# Patient Record
Sex: Male | Born: 1950 | Race: White | Hispanic: No | Marital: Married | Smoking: Former smoker
Health system: Southern US, Community
[De-identification: ages and names within clinical notes are randomized; demographics above are authoritative.]

## PROBLEM LIST (undated history)

## (undated) DIAGNOSIS — J302 Other seasonal allergic rhinitis: Secondary | ICD-10-CM

## (undated) DIAGNOSIS — N400 Enlarged prostate without lower urinary tract symptoms: Secondary | ICD-10-CM

## (undated) DIAGNOSIS — M1990 Unspecified osteoarthritis, unspecified site: Secondary | ICD-10-CM

## (undated) DIAGNOSIS — M3130 Wegener's granulomatosis without renal involvement: Secondary | ICD-10-CM

## (undated) DIAGNOSIS — K219 Gastro-esophageal reflux disease without esophagitis: Secondary | ICD-10-CM

## (undated) DIAGNOSIS — M5412 Radiculopathy, cervical region: Secondary | ICD-10-CM

## (undated) DIAGNOSIS — Z7983 Long term (current) use of bisphosphonates: Secondary | ICD-10-CM

## (undated) DIAGNOSIS — M4312 Spondylolisthesis, cervical region: Secondary | ICD-10-CM

## (undated) DIAGNOSIS — R29898 Other symptoms and signs involving the musculoskeletal system: Secondary | ICD-10-CM

## (undated) DIAGNOSIS — K810 Acute cholecystitis: Secondary | ICD-10-CM

## (undated) DIAGNOSIS — Q231 Congenital insufficiency of aortic valve: Secondary | ICD-10-CM

## (undated) DIAGNOSIS — D649 Anemia, unspecified: Secondary | ICD-10-CM

## (undated) DIAGNOSIS — K649 Unspecified hemorrhoids: Secondary | ICD-10-CM

## (undated) DIAGNOSIS — I700 Atherosclerosis of aorta: Secondary | ICD-10-CM

## (undated) DIAGNOSIS — I4510 Unspecified right bundle-branch block: Secondary | ICD-10-CM

## (undated) DIAGNOSIS — M4802 Spinal stenosis, cervical region: Secondary | ICD-10-CM

## (undated) DIAGNOSIS — E249 Cushing's syndrome, unspecified: Secondary | ICD-10-CM

## (undated) DIAGNOSIS — I10 Essential (primary) hypertension: Secondary | ICD-10-CM

## (undated) DIAGNOSIS — E039 Hypothyroidism, unspecified: Secondary | ICD-10-CM

## (undated) DIAGNOSIS — H5789 Other specified disorders of eye and adnexa: Secondary | ICD-10-CM

## (undated) DIAGNOSIS — J984 Other disorders of lung: Secondary | ICD-10-CM

## (undated) DIAGNOSIS — I7121 Aneurysm of the ascending aorta, without rupture: Secondary | ICD-10-CM

## (undated) DIAGNOSIS — K589 Irritable bowel syndrome without diarrhea: Secondary | ICD-10-CM

## (undated) DIAGNOSIS — R011 Cardiac murmur, unspecified: Secondary | ICD-10-CM

## (undated) DIAGNOSIS — R7303 Prediabetes: Secondary | ICD-10-CM

## (undated) DIAGNOSIS — M48061 Spinal stenosis, lumbar region without neurogenic claudication: Secondary | ICD-10-CM

## (undated) DIAGNOSIS — I2510 Atherosclerotic heart disease of native coronary artery without angina pectoris: Secondary | ICD-10-CM

## (undated) DIAGNOSIS — K5090 Crohn's disease, unspecified, without complications: Secondary | ICD-10-CM

## (undated) DIAGNOSIS — M810 Age-related osteoporosis without current pathological fracture: Secondary | ICD-10-CM

## (undated) DIAGNOSIS — K760 Fatty (change of) liver, not elsewhere classified: Secondary | ICD-10-CM

## (undated) DIAGNOSIS — Z7952 Long term (current) use of systemic steroids: Secondary | ICD-10-CM

## (undated) DIAGNOSIS — E7800 Pure hypercholesterolemia, unspecified: Secondary | ICD-10-CM

## (undated) DIAGNOSIS — R7309 Other abnormal glucose: Secondary | ICD-10-CM

## (undated) DIAGNOSIS — M8580 Other specified disorders of bone density and structure, unspecified site: Secondary | ICD-10-CM

## (undated) DIAGNOSIS — G709 Myoneural disorder, unspecified: Secondary | ICD-10-CM

## (undated) HISTORY — PX: CHOLECYSTECTOMY: SHX55

## (undated) HISTORY — DX: Cardiac murmur, unspecified: R01.1

## (undated) HISTORY — PX: VASECTOMY: SHX75

## (undated) HISTORY — PX: CATARACT EXTRACTION W/ INTRAOCULAR LENS  IMPLANT, BILATERAL: SHX1307

## (undated) HISTORY — DX: Congenital insufficiency of aortic valve: Q23.1

## (undated) HISTORY — DX: Aneurysm of the ascending aorta, without rupture: I71.21

## (undated) HISTORY — PX: TREATMENT FISTULA ANAL: SUR1390

## (undated) HISTORY — PX: LASER RESURFACING: SHX1954

---

## 2011-08-15 ENCOUNTER — Ambulatory Visit: Payer: Self-pay | Admitting: Rheumatology

## 2011-09-03 ENCOUNTER — Ambulatory Visit: Payer: Self-pay | Admitting: Rheumatology

## 2011-09-24 DEATH — deceased

## 2013-03-05 ENCOUNTER — Ambulatory Visit: Payer: Self-pay | Admitting: Family Medicine

## 2013-06-03 DIAGNOSIS — M1990 Unspecified osteoarthritis, unspecified site: Secondary | ICD-10-CM | POA: Insufficient documentation

## 2014-06-25 ENCOUNTER — Ambulatory Visit
Admit: 2014-06-25 | Disposition: A | Discharge status: Home / Self Care | Payer: Self-pay | Attending: Family Medicine | Admitting: Family Medicine

## 2014-09-20 ENCOUNTER — Ambulatory Visit: Payer: Self-pay | Admitting: Family Medicine

## 2014-10-20 ENCOUNTER — Other Ambulatory Visit: Payer: Self-pay | Admitting: Family Medicine

## 2014-10-20 ENCOUNTER — Encounter: Payer: Self-pay | Admitting: *Deleted

## 2014-10-20 NOTE — Discharge Instructions (Signed)
General Anesthesia, Care After Refer to this sheet in the next few weeks. These instructions provide you with information on caring for yourself after your procedure. Your health care provider may also give you more specific instructions. Your treatment has been planned according to current medical practices, but problems sometimes occur. Call your health care provider if you have any problems or questions after your procedure. WHAT TO EXPECT AFTER THE PROCEDURE After the procedure, it is typical to experience:  Sleepiness.  Nausea and vomiting. HOME CARE INSTRUCTIONS  For the first 24 hours after general anesthesia:  Have a responsible person with you.  Do not drive a car. If you are alone, do not take public transportation.  Do not drink alcohol.  Do not take medicine that has not been prescribed by your health care provider.  Do not sign important papers or make important decisions.  You may resume a normal diet and activities as directed by your health care provider.  Change bandages (dressings) as directed.  If you have questions or problems that seem related to general anesthesia, call the hospital and ask for the anesthetist or anesthesiologist on call. SEEK MEDICAL CARE IF:  You have nausea and vomiting that continue the day after anesthesia.  You develop a rash. SEEK IMMEDIATE MEDICAL CARE IF:   You have difficulty breathing.  You have chest pain.  You have any allergic problems. Document Released: 06/18/2000 Document Revised: 03/17/2013 Document Reviewed: 09/25/2012 [facility] Patient Information 2015 [place], [place]. This information is not intended to replace advice given to you by your health care provider. Make sure you discuss any questions you have with your health care provider.

## 2014-10-25 ENCOUNTER — Ambulatory Visit: Payer: Managed Care, Other (non HMO) | Admitting: Anesthesiology

## 2014-10-25 ENCOUNTER — Encounter
Admission: RE | Disposition: A | Discharge status: Home / Self Care | Payer: Self-pay | Source: Ambulatory Visit | Attending: Gastroenterology

## 2014-10-25 ENCOUNTER — Ambulatory Visit
Admission: RE | Admit: 2014-10-25 | Discharge: 2014-10-25 | Disposition: A | Discharge status: Home / Self Care | Payer: Managed Care, Other (non HMO) | Source: Ambulatory Visit | Attending: Gastroenterology | Admitting: Gastroenterology

## 2014-10-25 DIAGNOSIS — M1990 Unspecified osteoarthritis, unspecified site: Secondary | ICD-10-CM | POA: Insufficient documentation

## 2014-10-25 DIAGNOSIS — M19042 Primary osteoarthritis, left hand: Secondary | ICD-10-CM | POA: Diagnosis not present

## 2014-10-25 DIAGNOSIS — Z87891 Personal history of nicotine dependence: Secondary | ICD-10-CM | POA: Diagnosis not present

## 2014-10-25 DIAGNOSIS — E0500 Thyrotoxicosis with diffuse goiter without thyrotoxic crisis or storm: Secondary | ICD-10-CM | POA: Insufficient documentation

## 2014-10-25 DIAGNOSIS — Z79899 Other long term (current) drug therapy: Secondary | ICD-10-CM | POA: Insufficient documentation

## 2014-10-25 DIAGNOSIS — Z7982 Long term (current) use of aspirin: Secondary | ICD-10-CM | POA: Diagnosis not present

## 2014-10-25 DIAGNOSIS — M3130 Wegener's granulomatosis without renal involvement: Secondary | ICD-10-CM | POA: Diagnosis not present

## 2014-10-25 DIAGNOSIS — M19041 Primary osteoarthritis, right hand: Secondary | ICD-10-CM | POA: Diagnosis not present

## 2014-10-25 DIAGNOSIS — Z1211 Encounter for screening for malignant neoplasm of colon: Secondary | ICD-10-CM | POA: Insufficient documentation

## 2014-10-25 DIAGNOSIS — M810 Age-related osteoporosis without current pathological fracture: Secondary | ICD-10-CM | POA: Insufficient documentation

## 2014-10-25 DIAGNOSIS — M6281 Muscle weakness (generalized): Secondary | ICD-10-CM | POA: Diagnosis not present

## 2014-10-25 DIAGNOSIS — K5730 Diverticulosis of large intestine without perforation or abscess without bleeding: Secondary | ICD-10-CM | POA: Diagnosis not present

## 2014-10-25 DIAGNOSIS — K219 Gastro-esophageal reflux disease without esophagitis: Secondary | ICD-10-CM | POA: Insufficient documentation

## 2014-10-25 DIAGNOSIS — K640 First degree hemorrhoids: Secondary | ICD-10-CM | POA: Diagnosis not present

## 2014-10-25 DIAGNOSIS — D125 Benign neoplasm of sigmoid colon: Secondary | ICD-10-CM | POA: Diagnosis not present

## 2014-10-25 DIAGNOSIS — E780 Pure hypercholesterolemia: Secondary | ICD-10-CM | POA: Insufficient documentation

## 2014-10-25 DIAGNOSIS — E249 Cushing's syndrome, unspecified: Secondary | ICD-10-CM | POA: Diagnosis not present

## 2014-10-25 DIAGNOSIS — Z9049 Acquired absence of other specified parts of digestive tract: Secondary | ICD-10-CM | POA: Insufficient documentation

## 2014-10-25 DIAGNOSIS — M8580 Other specified disorders of bone density and structure, unspecified site: Secondary | ICD-10-CM | POA: Diagnosis not present

## 2014-10-25 HISTORY — DX: Wegener's granulomatosis without renal involvement: M31.30

## 2014-10-25 HISTORY — DX: Other disorders of lung: J98.4

## 2014-10-25 HISTORY — DX: Other seasonal allergic rhinitis: J30.2

## 2014-10-25 HISTORY — DX: Myoneural disorder, unspecified: G70.9

## 2014-10-25 HISTORY — DX: Cushing's syndrome, unspecified: E24.9

## 2014-10-25 HISTORY — DX: Unspecified osteoarthritis, unspecified site: M19.90

## 2014-10-25 HISTORY — DX: Age-related osteoporosis without current pathological fracture: M81.0

## 2014-10-25 HISTORY — DX: Gastro-esophageal reflux disease without esophagitis: K21.9

## 2014-10-25 HISTORY — DX: Other specified disorders of bone density and structure, unspecified site: M85.80

## 2014-10-25 HISTORY — DX: Hypothyroidism, unspecified: E03.9

## 2014-10-25 HISTORY — PX: COLONOSCOPY: SHX5424

## 2014-10-25 HISTORY — DX: Pure hypercholesterolemia, unspecified: E78.00

## 2014-10-25 SURGERY — COLONOSCOPY
Anesthesia: Monitor Anesthesia Care | Wound class: Contaminated

## 2014-10-25 MED ORDER — LIDOCAINE HCL (CARDIAC) 20 MG/ML IV SOLN
INTRAVENOUS | Status: DC | PRN
  Administered 2014-10-25: 50 mg via INTRAVENOUS

## 2014-10-25 MED ORDER — OXYCODONE HCL 5 MG PO TABS: 5.0000 mg | ORAL_TABLET | Freq: Once | ORAL | Status: DC | PRN

## 2014-10-25 MED ORDER — OXYCODONE HCL 5 MG/5ML PO SOLN: 5.0000 mg | Freq: Once | ORAL | Status: DC | PRN

## 2014-10-25 MED ORDER — PROPOFOL 10 MG/ML IV BOLUS
INTRAVENOUS | Status: DC | PRN
  Administered 2014-10-25 (×2): 50 mg via INTRAVENOUS
  Administered 2014-10-25: 100 mg via INTRAVENOUS
  Administered 2014-10-25 (×4): 50 mg via INTRAVENOUS
  Administered 2014-10-25: 100 mg via INTRAVENOUS

## 2014-10-25 MED ORDER — LACTATED RINGERS IV SOLN
INTRAVENOUS | Status: DC
  Administered 2014-10-25: 10:00:00 via INTRAVENOUS

## 2014-10-25 SURGICAL SUPPLY — 28 items
CANISTER SUCT 1200ML W/VALVE (MISCELLANEOUS) ×3 IMPLANT
FCP ESCP3.2XJMB 240X2.8X (MISCELLANEOUS)
FORCEPS BIOP RAD 4 LRG CAP 4 (CUTTING FORCEPS) ×3 IMPLANT
FORCEPS BIOP RJ4 240 W/NDL (MISCELLANEOUS)
FORCEPS ESCP3.2XJMB 240X2.8X (MISCELLANEOUS) IMPLANT
GOWN CVR UNV OPN BCK APRN NK (MISCELLANEOUS) ×2 IMPLANT
GOWN ISOL THUMB LOOP REG UNIV (MISCELLANEOUS) ×4
HEMOCLIP INSTINCT (CLIP) IMPLANT
INJECTOR VARIJECT VIN23 (MISCELLANEOUS) IMPLANT
KIT CO2 TUBING (TUBING) IMPLANT
KIT DEFENDO VALVE AND CONN (KITS) IMPLANT
KIT ENDO PROCEDURE OLY (KITS) ×3 IMPLANT
LIGATOR MULTIBAND 6SHOOTER MBL (MISCELLANEOUS) IMPLANT
MARKER SPOT ENDO TATTOO 5ML (MISCELLANEOUS) IMPLANT
PAD GROUND ADULT SPLIT (MISCELLANEOUS) IMPLANT
SNARE SHORT THROW 13M SML OVAL (MISCELLANEOUS) IMPLANT
SNARE SHORT THROW 30M LRG OVAL (MISCELLANEOUS) IMPLANT
SPOT EX ENDOSCOPIC TATTOO (MISCELLANEOUS)
SUCTION POLY TRAP 4CHAMBER (MISCELLANEOUS) IMPLANT
TRAP SUCTION POLY (MISCELLANEOUS) IMPLANT
TUBING CONN 6MMX3.1M (TUBING)
TUBING SUCTION CONN 0.25 STRL (TUBING) IMPLANT
UNDERPAD 30X60 958B10 (PK) (MISCELLANEOUS) IMPLANT
VALVE BIOPSY ENDO (VALVE) IMPLANT
VARIJECT INJECTOR VIN23 (MISCELLANEOUS)
WATER AUXILLARY (MISCELLANEOUS) IMPLANT
WATER STERILE IRR 250ML POUR (IV SOLUTION) ×3 IMPLANT
WATER STERILE IRR 500ML POUR (IV SOLUTION) IMPLANT

## 2014-10-25 NOTE — Anesthesia Preprocedure Evaluation (Signed)
Anesthesia Evaluation  Patient identified by MRN, date of birth, ID band  Reviewed: NPO status   History of Anesthesia Complications Negative for: history of anesthetic complications  Airway Mallampati: II  TM Distance: >3 FB Neck ROM: full    Dental no notable dental hx.    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;  Osteoporosis;    Abdominal   Peds  Hematology negative hematology ROS (+)   Anesthesia Other Findings   Reproductive/Obstetrics                             Anesthesia Physical Anesthesia Plan  ASA: III  Anesthesia Plan: MAC   Post-op Pain Management:    Induction:   Airway Management Planned:   Additional Equipment:   Intra-op Plan:   Post-operative Plan:   Informed Consent: I have reviewed the patients History and Physical, chart, labs and discussed the procedure including the risks, benefits and alternatives for the proposed anesthesia with the patient or authorized representative who has indicated his/her understanding and acceptance.     Plan Discussed with: CRNA  Anesthesia Plan Comments: (+/- stress dose steroids;)        Anesthesia Quick Evaluation

## 2014-10-25 NOTE — H&P (Signed)
[facility]  [address] [place], [place] [address] Phone: [phone] Fax : [phone]  Primary Care Physician:  No primary care provider on file. Primary Gastroenterologist:  Dr. Allen Norris  Pre-Procedure History & Physical: HPI:  Adrian Zavala is a 64 y.o. male is here for a screening colonoscopy.   Past Medical History  Diagnosis Date  . Wegener's granulomatosis   . GERD (gastroesophageal reflux disease)   . Osteoporosis     Femur  . Osteopenia   . Hypothyroidism     graves  . Cushing syndrome   . Hypercholesteremia   . Arthritis     Spine, Left Hip, Hands  . Neuromuscular disorder     some leg weakness S/P Chemo for Wegener's  . Lung disorder     S/P chemo for Wegener's  . Seasonal allergies     Past Surgical History  Procedure Laterality Date  . Treatment fistula anal      closure with rectal advancement flap  . Cholecystectomy      has "metal sutures"     Prior to Admission medications   Medication Sig Start Date End Date Taking? Authorizing Provider  acetaminophen (TYLENOL) 650 MG CR tablet Take 1,300 mg by mouth 2 (two) times daily.   Yes Historical Provider, MD  alendronate (FOSAMAX) 70 MG tablet Take 1 tablet by mouth once every week 10/21/14  Yes Arlis Porta., MD  aspirin 81 MG tablet Take 81 mg by mouth daily. AM   Yes Historical Provider, MD  atorvastatin (LIPITOR) 20 MG tablet Take 20 mg by mouth daily. AM   Yes Historical Provider, MD  B Complex-C (SUPER B COMPLEX PO) Take by mouth daily. AM   Yes Historical Provider, MD  calcium carbonate (OS-CAL) 600 MG TABS tablet Take 600 mg by mouth daily. AM   Yes Historical Provider, MD  Cholecalciferol (VITAMIN D-3 PO) Take by mouth. AM   Yes Historical Provider, MD  dextromethorphan-guaiFENesin (MUCINEX DM) 30-600 MG per 12 hr tablet Take 1 tablet by mouth 2 (two) times daily as needed for cough.   Yes Historical Provider, MD  fluticasone (FLONASE) 50 MCG/ACT nasal spray Place into  both nostrils daily as needed for allergies or rhinitis.   Yes Historical Provider, MD  folic acid (FOLVITE) 262 MCG tablet Take 400 mcg by mouth daily. AM   Yes Historical Provider, MD  levothyroxine (SYNTHROID, LEVOTHROID) 25 MCG tablet Take 25 mcg by mouth daily before breakfast.   Yes Historical Provider, MD  loratadine (CLARITIN) 10 MG tablet Take 10 mg by mouth daily as needed for allergies.   Yes Historical Provider, MD  meloxicam (MOBIC) 15 MG tablet Take 15 mg by mouth daily. PM   Yes Historical Provider, MD  methotrexate (RHEUMATREX) 2.5 MG tablet Take 15 mg by mouth once a week. Sat or Sun PM   Yes Historical Provider, MD  niacin 500 MG CR capsule Take 750 mg by mouth daily. AM   Yes Historical Provider, MD  prednisoLONE 5 MG TABS tablet Take 5 mg by mouth daily. AM   Yes Historical Provider, MD  ranitidine (ZANTAC) 150 MG tablet Take 150 mg by mouth daily. AM   Yes Historical Provider, MD    Allergies as of 09/28/2014  . (Not on File)    History reviewed. No pertinent family history.  History   Social History  . Marital Status: Married    Spouse Name: N/A  . Number of Children: N/A  . Years of  Education: N/A   Occupational History  . Not on file.   Social History Main Topics  . Smoking status: Former Smoker    Quit date: 08/24/1996  . Smokeless tobacco: Not on file  . Alcohol Use: 1.8 oz/week    3 Cans of beer per week  . Drug Use: Not on file  . Sexual Activity: Not on file   Other Topics Concern  . Not on file   Social History Narrative    Review of Systems: See HPI, otherwise negative ROS  Physical Exam: BP 123/77 mmHg  Pulse 96  Temp(Src) 99.7 F (37.6 C)  Resp 16  Ht 5' 10"  (1.778 m)  Wt 163 lb (73.936 kg)  BMI 23.39 kg/m2  SpO2 98% General:   Alert,  pleasant and cooperative in NAD Head:  Normocephalic and atraumatic. Neck:  Supple; no masses or thyromegaly. Lungs:  Clear throughout to auscultation.    Heart:  Regular rate and  rhythm. Abdomen:  Soft, nontender and nondistended. Normal bowel sounds, without guarding, and without rebound.   Neurologic:  Alert and  oriented x4;  grossly normal neurologically.  Impression/Plan: [name] is now here to undergo a screening colonoscopy.  Risks, benefits, and alternatives regarding colonoscopy have been reviewed with the patient.  Questions have been answered.  All parties agreeable.

## 2014-10-25 NOTE — Op Note (Signed)
[facility] Patient Name: Adrian Zavala Procedure Date: 10/25/2014 10:16 AM MRN: [id number] Account #: [id number] Date of Birth: 1950/11/20 Admit Type: Outpatient Age: 64 Room: [facility] OR ROOM 01 Gender: Male Note Status: Finalized Procedure:         Colonoscopy Indications:       Screening for colorectal malignant neoplasm Providers:         Lucilla Lame, MD Referring MD:      Arlis Porta, MD (Referring MD) Medicines:         Propofol per Anesthesia Complications:     No immediate complications. Procedure:         Pre-Anesthesia Assessment:                    - Prior to the procedure, a History and Physical was                     performed, and patient medications and allergies were                     reviewed. The patient's tolerance of previous anesthesia                     was also reviewed. The risks and benefits of the procedure                     and the sedation options and risks were discussed with the                     patient. All questions were answered, and informed consent                     was obtained. Prior Anticoagulants: The patient has taken                     no previous anticoagulant or antiplatelet agents. ASA                     Grade Assessment: II - A patient with mild systemic                     disease. After reviewing the risks and benefits, the                     patient was deemed in satisfactory condition to undergo                     the procedure.                    After obtaining informed consent, the colonoscope was                     passed under direct vision. Throughout the procedure, the                     patient's blood pressure, pulse, and oxygen saturations                     were monitored continuously. The Olympus CF-HQ190L                     Colonoscope (S#. [id number]) was introduced through the anus  and advanced to the the cecum, identified by appendiceal                      orifice and ileocecal valve. The colonoscopy was performed                     without difficulty. The patient tolerated the procedure                     well. The quality of the bowel preparation was excellent. Findings:      The perianal and digital rectal examinations were normal.      A 3 mm polyp was found in the sigmoid colon. The polyp was sessile. The       polyp was removed with a cold biopsy forceps. Resection and retrieval       were complete.      A single small-mouthed diverticulum was found in the sigmoid colon.      Non-bleeding internal hemorrhoids were found during retroflexion. The       hemorrhoids were Grade I (internal hemorrhoids that do not prolapse). Impression:        - One 3 mm polyp in the sigmoid colon. Resected and                     retrieved.                    - Diverticulosis in the sigmoid colon.                    - Non-bleeding internal hemorrhoids. Recommendation:    - Repeat colonoscopy in 5 years if polyp adenoma and 10                     years if hyperplastic Procedure Code(s): --- Professional ---                    [phone], Colonoscopy, flexible; with biopsy, single or                     multiple Diagnosis Code(s): --- Professional ---                    Z12.11, Encounter for screening for malignant neoplasm of                     colon                    D12.5, Benign neoplasm of sigmoid colon                    K64.0, First degree hemorrhoids CPT copyright 2014 American Medical Association. All rights reserved. The codes documented in this report are preliminary and upon coder review may  be revised to meet current compliance requirements. Lucilla Lame, MD 10/25/2014 10:53:53 AM This report has been signed electronically. Number of Addenda: 0 Note Initiated On: 10/25/2014 10:16 AM Scope Withdrawal Time: 0 hours 8 minutes 22 seconds  Total Procedure Duration: 0 hours 14 minutes 21 seconds       [facility]

## 2014-10-25 NOTE — Anesthesia Procedure Notes (Signed)
Procedure Name: MAC Performed by: Arlene Genova Pre-anesthesia Checklist: Patient identified, Emergency Drugs available, Suction available, Timeout performed and Patient being monitored Patient Re-evaluated:Patient Re-evaluated prior to inductionOxygen Delivery Method: Nasal cannula Placement Confirmation: positive ETCO2     

## 2014-10-25 NOTE — Anesthesia Postprocedure Evaluation (Signed)
  Anesthesia Post-op Note  Patient: Adrian Zavala  Procedure(s) Performed: Procedure(s): COLONOSCOPY (N/A)  Anesthesia type:MAC  Patient location: PACU  Post pain: Pain level controlled  Post assessment: Post-op Vital signs reviewed, Patient's Cardiovascular Status Stable, Respiratory Function Stable, Patent Airway and No signs of Nausea or vomiting  Post vital signs: Reviewed and stable  Last Vitals:  Filed Vitals:   10/25/14 1057  BP:   Pulse: 75  Temp: 36.5 C  Resp: 19    Level of consciousness: awake, alert  and patient cooperative  Complications: No apparent anesthesia complications

## 2014-10-25 NOTE — Transfer of Care (Signed)
Immediate Anesthesia Transfer of Care Note  Patient: Adrian Zavala  Procedure(s) Performed: Procedure(s): COLONOSCOPY (N/A)  Patient Location: PACU  Anesthesia Type: MAC  Level of Consciousness: awake, alert  and patient cooperative  Airway and Oxygen Therapy: Patient Spontanous Breathing and Patient connected to supplemental oxygen  Post-op Assessment: Post-op Vital signs reviewed, Patient's Cardiovascular Status Stable, Respiratory Function Stable, Patent Airway and No signs of Nausea or vomiting  Post-op Vital Signs: Reviewed and stable  Complications: No apparent anesthesia complications

## 2014-10-26 ENCOUNTER — Encounter: Payer: Self-pay | Admitting: Gastroenterology

## 2014-10-27 LAB — SURGICAL PATHOLOGY

## 2014-10-31 ENCOUNTER — Encounter: Payer: Self-pay | Admitting: Gastroenterology

## 2014-11-23 ENCOUNTER — Other Ambulatory Visit: Payer: Self-pay | Admitting: Family Medicine

## 2014-12-06 ENCOUNTER — Other Ambulatory Visit: Payer: Self-pay | Admitting: Family Medicine

## 2014-12-14 ENCOUNTER — Other Ambulatory Visit: Payer: Self-pay | Admitting: *Deleted

## 2014-12-14 MED ORDER — RANITIDINE HCL 150 MG PO CAPS: 150.0000 mg | ORAL_CAPSULE | Freq: Every day | ORAL | Status: DC | PRN

## 2015-01-03 ENCOUNTER — Encounter: Payer: Self-pay | Admitting: Family Medicine

## 2015-01-03 ENCOUNTER — Ambulatory Visit (INDEPENDENT_AMBULATORY_CARE_PROVIDER_SITE_OTHER): Payer: Managed Care, Other (non HMO) | Admitting: Family Medicine

## 2015-01-03 VITALS — BP 113/78 | HR 73 | Temp 98.0°F | Resp 16 | Ht 70.0 in | Wt 168.4 lb

## 2015-01-03 DIAGNOSIS — K5730 Diverticulosis of large intestine without perforation or abscess without bleeding: Secondary | ICD-10-CM

## 2015-01-03 DIAGNOSIS — H5789 Other specified disorders of eye and adnexa: Secondary | ICD-10-CM | POA: Insufficient documentation

## 2015-01-03 DIAGNOSIS — R011 Cardiac murmur, unspecified: Secondary | ICD-10-CM | POA: Diagnosis not present

## 2015-01-03 DIAGNOSIS — E785 Hyperlipidemia, unspecified: Secondary | ICD-10-CM | POA: Insufficient documentation

## 2015-01-03 DIAGNOSIS — I10 Essential (primary) hypertension: Secondary | ICD-10-CM | POA: Insufficient documentation

## 2015-01-03 DIAGNOSIS — K529 Noninfective gastroenteritis and colitis, unspecified: Secondary | ICD-10-CM | POA: Insufficient documentation

## 2015-01-03 DIAGNOSIS — Q231 Congenital insufficiency of aortic valve: Secondary | ICD-10-CM | POA: Insufficient documentation

## 2015-01-03 DIAGNOSIS — E079 Disorder of thyroid, unspecified: Secondary | ICD-10-CM

## 2015-01-03 DIAGNOSIS — E039 Hypothyroidism, unspecified: Secondary | ICD-10-CM | POA: Insufficient documentation

## 2015-01-03 NOTE — Progress Notes (Signed)
Name: Adrian Zavala   MRN: [id number]    DOB: 1950/06/24   Date:01/03/2015       Progress Note  Subjective  Chief Complaint  Chief Complaint  Patient presents with  . Hyperlipidemia  . Hypothyroidism    HPI  Here for f/u of hyperlipidemia and hypothyroidism.  Has Wegener's granulomatosis.  Followed by Dr. Precious Reel.  He feels well overall.  HE has been dx'd with cutaneous lupus.  Already treated. No problem-specific assessment & plan notes found for this encounter.   Past Medical History  Diagnosis Date  . 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[place])   . GERD (gastroesophageal reflux disease)   . Osteoporosis     Femur  . Osteopenia   . Hypothyroidism     graves  . 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[place])   . Hypercholesteremia   . Arthritis     Spine, Left Hip, Hands  . Neuromuscular disorder ([place])     some leg weakness S/P Chemo for Wegener's  . Lung disorder     S/P chemo for Wegener's  . Seasonal allergies     Social History  Substance Use Topics  . Smoking status: Former Smoker    Quit date: 08/24/1996  . Smokeless tobacco: Not on file  . Alcohol Use: 1.8 oz/week    3 Cans of beer per week     Current outpatient prescriptions:  .  acetaminophen (TYLENOL) 650 MG CR tablet, Take 1,300 mg by mouth 2 (two) times daily., Disp: , Rfl:  .  alendronate (FOSAMAX) 70 MG tablet, Take 1 tablet by mouth once every week, Disp: 12 tablet, Rfl: 3 .  aspirin 81 MG tablet, Take 81 mg by mouth daily. AM, Disp: , Rfl:  .  atorvastatin (LIPITOR) 20 MG tablet, Take 1 tablet by mouth  daily, Disp: 90 tablet, Rfl: 3 .  B Complex-C (SUPER B COMPLEX PO), Take by mouth daily. AM, Disp: , Rfl:  .  calcium carbonate (OS-CAL) 600 MG TABS tablet, Take 600 mg by mouth daily. AM, Disp: , Rfl:  .  Cholecalciferol (VITAMIN D-3 PO), Take by mouth. AM, Disp: , Rfl:  .  fluticasone (FLONASE) 50 MCG/ACT nasal spray, Place into both nostrils daily as needed for allergies or rhinitis., Disp: , Rfl:  .  folic acid  (FOLVITE) 650 MCG tablet, Take 400 mcg by mouth daily. AM, Disp: , Rfl:  .  levothyroxine (SYNTHROID, LEVOTHROID) 25 MCG tablet, Take 25 mcg by mouth daily before breakfast., Disp: , Rfl:  .  loratadine (CLARITIN) 10 MG tablet, Take 10 mg by mouth daily as needed for allergies., Disp: , Rfl:  .  methotrexate (RHEUMATREX) 2.5 MG tablet, Take 15 mg by mouth once a week. Sat or Sun PM, Disp: , Rfl:  .  niacin 500 MG CR capsule, Take 750 mg by mouth daily. AM, Disp: , Rfl:  .  prednisoLONE 5 MG TABS tablet, Take 5 mg by mouth daily. AM, Disp: , Rfl:  .  ranitidine (ZANTAC) 150 MG capsule, Take 1 capsule (150 mg total) by mouth daily as needed for heartburn., Disp: 90 capsule, Rfl: 3  Allergies  Allergen Reactions  . Sulfa Antibiotics Anaphylaxis  . Sulfamethoxazole-Trimethoprim Anaphylaxis  . Penicillins Other (See Comments)    Passes out  . Codeine Itching, Other (See Comments) and Rash    tingling Rash and dizziness  . Prilosec [Omeprazole] Hives and Rash  . Tetracycline Rash  . Tetracyclines & Related Rash    Review of Systems  Constitutional: Negative for fever,  chills, weight loss and malaise/fatigue.  HENT: Negative for hearing loss.   Eyes: Negative for blurred vision and double vision.  Respiratory: Negative for cough, shortness of breath and wheezing.   Cardiovascular: Negative for chest pain, palpitations, orthopnea and leg swelling.  Gastrointestinal: Negative for heartburn, abdominal pain and blood in stool.  Genitourinary: Negative for dysuria, urgency and frequency.  Musculoskeletal: Positive for myalgias (muscle cramps in calves.).  Neurological: Negative for dizziness, weakness and headaches.      Objective  Filed Vitals:   01/03/15 1105  BP: 113/78  Pulse: 73  Temp: 98 F (36.7 C)  TempSrc: Oral  Resp: 16  Height: 5' 10"  (1.778 m)  Weight: 168 lb 6.4 oz (76.386 kg)     Physical Exam  Constitutional: He is oriented to person, place, and time and  well-developed, well-nourished, and in no distress. No distress.  HENT:  Head: Normocephalic and atraumatic.  Eyes: Conjunctivae and EOM are normal. Pupils are equal, round, and reactive to light. No scleral icterus.  Neck: Normal range of motion. Neck supple. Carotid bruit is not present. No thyromegaly present.  Cardiovascular: Normal rate, regular rhythm and intact distal pulses.  Exam reveals no gallop and no friction rub.   Murmur heard.  Systolic murmur is present with a grade of 3/6  URSB  Pulmonary/Chest: Effort normal and breath sounds normal. No respiratory distress. He has no wheezes. He has no rales.  Abdominal: Soft. Bowel sounds are normal. He exhibits no distension, no abdominal bruit and no mass. There is no tenderness.  Musculoskeletal: He exhibits no edema.  Lymphadenopathy:    He has no cervical adenopathy.  Neurological: He is alert and oriented to person, place, and time.  Vitals reviewed.     Recent Results (from the past 2160 hour(s))  Surgical pathology     Status: None   Collection Time: 10/25/14 10:53 AM  Result Value Ref Range   SURGICAL PATHOLOGY      Surgical Pathology CASE: [id number] PATIENT: Hesham Behnke Surgical Pathology Report     SPECIMEN SUBMITTED: A. Colon polyp, sigmoid, biopsy  CLINICAL HISTORY: None provided  PRE-OPERATIVE DIAGNOSIS: Screening  POST-OPERATIVE DIAGNOSIS: Diverticulosis, hemorrhoids     DIAGNOSIS: A. COLON POLYP, SIGMOID; BIOPSY: - NONSPECIFIC [place] HYPERPLASIA IN A 1.5 MM SAMPLE. - NEGATIVE FOR DYSPLASIA AND MALIGNANCY; ADDITIONAL DEEPER SECTIONS WERE REVIEWED.   GROSS DESCRIPTION: A. Labeled: sigmoid colon polyp Tissue Fragment(s): 1 Measurement: 0.1 cm Comment: Tan, marked orange  Entirely submitted in cassette(s): 1   Final Diagnosis performed by Bryan Lemma, MD.  Electronically signed 10/27/2014 4:25:00PM    The electronic signature indicates that the named Attending Pathologist has  evaluated the specimen  Technical component performed at [facility], [address] [place], [place] [address] Lab: [phone] Dir: Darrick Penna. Evette Doffing, MD  Professional compo nent performed at [facility], [facility], [place], [place], [place] [address] Lab: [phone] Dir: Dellia Nims. Reuel Derby, MD       Assessment & Plan  1. HLD (hyperlipidemia)  - Comprehensive Metabolic Panel (CMET) - Lipid Profile  2. Disease of thyroid gland  - TSH  3. Cardiac murmur  - Ambulatory referral to Cardiology  4. Diverticulosis of large intestine without hemorrhage

## 2015-01-03 NOTE — Patient Instructions (Signed)
Had flu shot at work.  Had colonoscopy in [place] (Dr. Allen Norris)- all ok except for small benign ployp and diverticulosis.

## 2015-01-05 LAB — LIPID PANEL
CHOL/HDL RATIO: 2.6 ratio (ref 0.0–5.0)
Cholesterol, Total: 146 mg/dL (ref 100–199)
HDL: 56 mg/dL (ref >39)
LDL CALC: 70 mg/dL (ref 0–99)
Triglycerides: 102 mg/dL (ref 0–149)
VLDL Cholesterol Cal: 20 mg/dL (ref 5–40)

## 2015-01-05 LAB — COMPREHENSIVE METABOLIC PANEL
ALBUMIN: 4.7 g/dL (ref 3.6–4.8)
ALT: 30 IU/L (ref 0–44)
AST: 27 IU/L (ref 0–40)
Albumin/Globulin Ratio: 2.5 (ref 1.1–2.5)
Alkaline Phosphatase: 85 IU/L (ref 39–117)
BUN/Creatinine Ratio: 20 (ref 10–22)
BUN: 20 mg/dL (ref 8–27)
Bilirubin Total: 0.7 mg/dL (ref 0.0–1.2)
CO2: 24 mmol/L (ref 18–29)
Calcium: 10.2 mg/dL (ref 8.6–10.2)
Chloride: 105 mmol/L (ref 97–108)
Creatinine, Ser: 0.98 mg/dL (ref 0.76–1.27)
GFR calc Af Amer: 94 mL/min/{1.73_m2} (ref >59)
GFR, EST NON AFRICAN AMERICAN: 82 mL/min/{1.73_m2} (ref >59)
GLOBULIN, TOTAL: 1.9 g/dL (ref 1.5–4.5)
Glucose: 86 mg/dL (ref 65–99)
Potassium: 4.4 mmol/L (ref 3.5–5.2)
SODIUM: 142 mmol/L (ref 134–144)
Total Protein: 6.6 g/dL (ref 6.0–8.5)

## 2015-01-05 LAB — TSH: TSH: 1.51 u[IU]/mL (ref 0.450–4.500)

## 2015-02-22 ENCOUNTER — Emergency Department: Payer: Managed Care, Other (non HMO)

## 2015-02-22 ENCOUNTER — Emergency Department
Admission: EM | Admit: 2015-02-22 | Discharge: 2015-02-22 | Disposition: A | Discharge status: Home / Self Care | Payer: Managed Care, Other (non HMO) | Attending: Emergency Medicine | Admitting: Emergency Medicine

## 2015-02-22 DIAGNOSIS — Z87891 Personal history of nicotine dependence: Secondary | ICD-10-CM | POA: Diagnosis not present

## 2015-02-22 DIAGNOSIS — Z791 Long term (current) use of non-steroidal anti-inflammatories (NSAID): Secondary | ICD-10-CM | POA: Insufficient documentation

## 2015-02-22 DIAGNOSIS — Z7952 Long term (current) use of systemic steroids: Secondary | ICD-10-CM | POA: Insufficient documentation

## 2015-02-22 DIAGNOSIS — M791 Myalgia, unspecified site: Secondary | ICD-10-CM

## 2015-02-22 DIAGNOSIS — Z880 Allergy status to penicillin: Secondary | ICD-10-CM | POA: Insufficient documentation

## 2015-02-22 DIAGNOSIS — R079 Chest pain, unspecified: Secondary | ICD-10-CM | POA: Diagnosis not present

## 2015-02-22 DIAGNOSIS — Z7982 Long term (current) use of aspirin: Secondary | ICD-10-CM | POA: Insufficient documentation

## 2015-02-22 DIAGNOSIS — Z79899 Other long term (current) drug therapy: Secondary | ICD-10-CM | POA: Insufficient documentation

## 2015-02-22 DIAGNOSIS — R1110 Vomiting, unspecified: Secondary | ICD-10-CM

## 2015-02-22 LAB — CBC
HCT: 47.9 % (ref 40.0–52.0)
HEMOGLOBIN: 15.7 g/dL (ref 13.0–18.0)
MCH: 32.7 pg (ref 26.0–34.0)
MCHC: 32.7 g/dL (ref 32.0–36.0)
MCV: 100 fL (ref 80.0–100.0)
PLATELETS: 230 10*3/uL (ref 150–440)
RBC: 4.79 MIL/uL (ref 4.40–5.90)
RDW: 14.2 % (ref 11.5–14.5)
WBC: 11.5 10*3/uL — ABNORMAL HIGH (ref 3.8–10.6)

## 2015-02-22 LAB — COMPREHENSIVE METABOLIC PANEL
ALT: 255 U/L — ABNORMAL HIGH (ref 17–63)
AST: 419 U/L — ABNORMAL HIGH (ref 15–41)
Albumin: 4.4 g/dL (ref 3.5–5.0)
Alkaline Phosphatase: 92 U/L (ref 38–126)
Anion gap: 10 (ref 5–15)
BUN: 38 mg/dL — ABNORMAL HIGH (ref 6–20)
CO2: 27 mmol/L (ref 22–32)
Calcium: 9.9 mg/dL (ref 8.9–10.3)
Chloride: 108 mmol/L (ref 101–111)
Creatinine, Ser: 1.02 mg/dL (ref 0.61–1.24)
GFR calc Af Amer: >60 mL/min (ref >60)
GFR calc non Af Amer: >60 mL/min (ref >60)
GLUCOSE: 127 mg/dL — AB (ref 65–99)
Potassium: 4.3 mmol/L (ref 3.5–5.1)
Sodium: 145 mmol/L (ref 135–145)
Total Bilirubin: 1.8 mg/dL — ABNORMAL HIGH (ref 0.3–1.2)
Total Protein: 6.9 g/dL (ref 6.5–8.1)

## 2015-02-22 LAB — LIPASE, BLOOD: LIPASE: 47 U/L (ref 11–51)

## 2015-02-22 LAB — TROPONIN I: Troponin I: <0.03 ng/mL (ref <0.031)

## 2015-02-22 MED ORDER — ONDANSETRON 4 MG PO TBDP
4.0000 mg | ORAL_TABLET | Freq: Three times a day (TID) | ORAL | Status: DC | PRN
Start: 2015-02-22 — End: 2015-02-22

## 2015-02-22 MED ORDER — ONDANSETRON HCL 4 MG/2ML IJ SOLN
4.0000 mg | Freq: Once | INTRAMUSCULAR | Status: AC
  Administered 2015-02-22: 4 mg via INTRAVENOUS

## 2015-02-22 MED ORDER — SODIUM CHLORIDE 0.9 % IV BOLUS (SEPSIS)
1000.0000 mL | Freq: Once | INTRAVENOUS | Status: AC
Start: 2015-02-22 — End: 2015-02-22
  Administered 2015-02-22: 1000 mL via INTRAVENOUS

## 2015-02-22 MED ORDER — ONDANSETRON HCL 4 MG/2ML IJ SOLN
INTRAMUSCULAR | Status: AC
  Administered 2015-02-22: 4 mg via INTRAVENOUS
  Filled 2015-02-22: qty 2

## 2015-02-22 MED ORDER — ONDANSETRON 4 MG PO TBDP: 4.0000 mg | ORAL_TABLET | Freq: Three times a day (TID) | ORAL | Status: DC | PRN

## 2015-02-22 MED ORDER — METOCLOPRAMIDE HCL 10 MG PO TABS
10.0000 mg | ORAL_TABLET | Freq: Once | ORAL | Status: AC
  Administered 2015-02-22: 10 mg via ORAL
  Filled 2015-02-22: qty 1

## 2015-02-22 MED ORDER — MORPHINE SULFATE (PF) 4 MG/ML IV SOLN
4.0000 mg | Freq: Once | INTRAVENOUS | Status: AC
  Administered 2015-02-22: 4 mg via INTRAVENOUS
  Filled 2015-02-22: qty 1

## 2015-02-22 NOTE — ED Provider Notes (Signed)
[facility] Emergency Department Provider Note  ____________________________________________  Time seen: Approximately 359 AM  I have reviewed the triage vital signs and the nursing notes.   HISTORY  Chief Complaint Emesis and Chest Pain    HPI Adrian Zavala is a 64 y.o. male who comes into the hospital with vomiting. The patient reports that around 1:15 he woke up and was vomiting. He reports that he started getting into bed and was having shakes and pain all over his chest, back and legs and groin. The patient has a history of Wegener's granulomatosis as well as arthritis. He reports that he has had symptoms like this in the past due to the autoimmune diseases. The patient reports that the pain seems a little bit better when he vomits but then it comes back for a little bit. The patient has some tightness in his abdomen before his vomiting but no actual pain at this time. The patient has had occasional Crohn symptoms without an acute diagnosis of Crohn's. He has also had diverticulosis. He reports that right now his pain is a 6 out of 10 and is in his rib cage on the side and in his back. The patient reports the pain is dull and steady. He was actively vomiting when I arrived in his room. The patient is here for treatment and evaluation. The patient's wife is concerned as he has had a [place] abnormalities with the symptoms in the past.   Past Medical History  Diagnosis Date  . 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[place])   . GERD (gastroesophageal reflux disease)   . Osteoporosis     Femur  . Osteopenia   . Hypothyroidism     graves  . 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[place])   . Hypercholesteremia   . Arthritis     Spine, Left Hip, Hands  . Neuromuscular disorder ([place])     some leg weakness S/P Chemo for Wegener's  . Lung disorder     S/P chemo for Wegener's  . Seasonal allergies     Patient Active Problem List   Diagnosis Date Noted  . Colitis 01/03/2015  . HLD (hyperlipidemia)  01/03/2015  . BP (high blood pressure) 01/03/2015  . Eye inflamed 01/03/2015  . Disease of thyroid gland 01/03/2015  . 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 Cardiac murmur 01/03/2015  . Special screening for malignant neoplasms, colon   . Benign neoplasm of sigmoid colon   . First degree hemorrhoids   . Arthritis, degenerative 06/03/2013    Past Surgical History  Procedure Laterality Date  . Treatment fistula anal      closure with rectal advancement flap  . Cholecystectomy      has "metal sutures"   . Colonoscopy N/A 10/25/2014    Procedure: COLONOSCOPY;  Surgeon: Lucilla Lame, MD;  Location: [place];  Service: Gastroenterology;  Laterality: N/A;    Current Outpatient Rx  Name  Route  Sig  Dispense  Refill  . acetaminophen (TYLENOL) 650 MG CR tablet   Oral   Take 350 mg by mouth daily as needed for pain or fever.          Marland Kitchen alendronate (FOSAMAX) 70 MG tablet      Take 1 tablet by mouth once every week   12 tablet   3   . aspirin 81 MG tablet   Oral   Take 81 mg by mouth daily. AM         . calcium carbonate (OS-CAL) 600 MG TABS tablet  Oral   Take 600 mg by mouth daily. AM         . folic acid (FOLVITE) 765 MCG tablet   Oral   Take 400 mcg by mouth daily. AM         . levothyroxine (SYNTHROID, LEVOTHROID) 25 MCG tablet   Oral   Take 25 mcg by mouth daily before breakfast.         . loratadine (CLARITIN) 10 MG tablet   Oral   Take 10 mg by mouth daily as needed for allergies.         . meloxicam (MOBIC) 15 MG tablet   Oral   Take 15 mg by mouth daily.         . methotrexate (RHEUMATREX) 2.5 MG tablet   Oral   Take 15 mg by mouth once a week. Sat or Sun PM         . niacin (NIASPAN) 500 MG CR tablet   Oral   Take 750 mg by mouth at bedtime.         . predniSONE (DELTASONE) 5 MG tablet   Oral   Take 5 mg by mouth daily.         . ranitidine (ZANTAC) 150 MG capsule   Oral   Take 1 capsule (150 mg total)  by mouth daily as needed for heartburn.   90 capsule   3   . Vitamin D, Cholecalciferol, 400 UNITS CAPS   Oral   Take 400 Units by mouth daily.         . ondansetron (ZOFRAN ODT) 4 MG disintegrating tablet   Oral   Take 1 tablet (4 mg total) by mouth every 8 (eight) hours as needed for nausea or vomiting.   20 tablet   0     Allergies Sulfa antibiotics; Sulfamethoxazole-trimethoprim; Penicillins; Codeine; Prilosec; Tetracycline; and Tetracyclines & related  No family history on file.  Social History Social History  Substance Use Topics  . Smoking status: Former Smoker    Quit date: 08/24/1996  . Smokeless tobacco: Not on file  . Alcohol Use: 1.8 oz/week    3 Cans of beer per week    Review of Systems Constitutional: No fever/chills Eyes: No visual changes. ENT: No sore throat. Cardiovascular: Denies chest pain. Respiratory: Denies shortness of breath. Gastrointestinal: No abdominal pain.  No nausea, no vomiting.  No diarrhea.  No constipation. Genitourinary: Negative for dysuria. Musculoskeletal: Negative for back pain. Skin: Negative for rash. Neurological: Negative for headaches, focal weakness or numbness.  10-point ROS otherwise negative.  ____________________________________________   PHYSICAL EXAM:  VITAL SIGNS: ED Triage Vitals  Enc Vitals Group     BP 02/22/15 0329 150/65 mmHg     Pulse Rate 02/22/15 0329 120     Resp 02/22/15 0329 30     Temp 02/22/15 0329 98.3 F (36.8 C)     Temp Source 02/22/15 0329 Oral     SpO2 02/22/15 0329 95 %     Weight 02/22/15 0329 165 lb (74.844 kg)     Height 02/22/15 0329 5' 8"  (1.727 m)     Head Cir --      Peak Flow --      Pain Score 02/22/15 0330 8     Pain Loc --      Pain Edu? --      Excl. in [place]? --     Constitutional: Alert and oriented. Well appearing and in acute distress. Eyes: Conjunctivae are normal.  PERRL. EOMI. Head: Atraumatic. Nose: No congestion/rhinnorhea. Mouth/Throat: Mucous  membranes are moist.  Oropharynx non-erythematous. Cardiovascular: Normal rate, regular rhythm. Grossly normal heart sounds.  Good peripheral circulation. Respiratory: Normal respiratory effort.  No retractions. Lungs CTAB. Gastrointestinal: Soft and nontender. No distention. Positive bowel sounds Musculoskeletal: No lower extremity tenderness nor edema.  Neurologic:  Normal speech and language.  Skin:  Skin is warm, dry and intact.  Psychiatric: Mood and affect are normal.   ____________________________________________   LABS (all labs ordered are listed, but only abnormal results are displayed)  Labs Reviewed  CBC - Abnormal; Notable for the following:    WBC 11.5 (*)    All other components within normal limits  COMPREHENSIVE METABOLIC PANEL - Abnormal; Notable for the following:    Glucose, Bld 127 (*)    BUN 38 (*)    AST 419 (*)    ALT 255 (*)    Total Bilirubin 1.8 (*)    All other components within normal limits  TROPONIN I  LIPASE, BLOOD   ____________________________________________  EKG  ED ECG REPORT I, Loney Hering, the attending physician, personally viewed and interpreted this ECG.   Date: 02/22/2015  EKG Time: 330  Rate: 122  Rhythm: sinus tachycardia  Axis: normal  Intervals:none  ST&T Change: none  ____________________________________________  RADIOLOGY  Abd [place]: post cholecystectomy, probably fatty inflitration of the liver as above ____________________________________________   PROCEDURES  Procedure(s) performed: None  Critical Care performed: No  ____________________________________________   INITIAL IMPRESSION / ASSESSMENT AND PLAN / ED COURSE  Pertinent labs & imaging results that were available during my care of the patient were reviewed by me and considered in my medical decision making (see chart for details).  This is a 64 year old male who comes in with vomiting and diffuse body pain consistent with myalgia. The  patient was given a liter of normal saline as well as some Zofran on arrival. He does have some transaminitis but his ultrasound is negative. The patient may have some hepatitis mild hepatitis causing his symptoms. After the liter of normal saline and the Zofran the patient was feeling improved but he still had some mild tachycardia. I will give the patient another liter of normal saline and attempt a by mouth trial. The patient's care will be signed out to Dr. Joni Fears who will follow-up the results of the hydration and by mouth trial. If the patient is feeling improved she'll be discharged to home. He reports that this is happened multiple times in the past and sometimes he just needs hydration. The patient will be reevaluated by Dr. Joni Fears. ____________________________________________   FINAL CLINICAL IMPRESSION(S) / ED DIAGNOSES  Final diagnoses:  Vomiting  Myalgia      Loney Hering, MD 02/22/15 [phone]

## 2015-02-22 NOTE — ED Notes (Signed)
Pt to triage via w/c, shaking; st since 1am having N/V; c/o pain to mid chest and mid back; wife reports hx of tetany with hypokalemia

## 2015-02-22 NOTE — ED Provider Notes (Signed)
-----------------------------------------   10:05 AM on 02/22/2015 -----------------------------------------  Feels better, tolerating oral intake, 10 home. We'll discharge follow up with primary care.  Carrie Mew, MD 02/22/15 1005

## 2015-02-22 NOTE — Discharge Instructions (Signed)
Nausea and Vomiting Nausea is a sick feeling that often comes before throwing up (vomiting). Vomiting is a reflex where stomach contents come out of your mouth. Vomiting can cause severe loss of body fluids (dehydration). Children and elderly adults can become dehydrated quickly, especially if they also have diarrhea. Nausea and vomiting are symptoms of a condition or disease. It is important to find the cause of your symptoms. CAUSES   Direct irritation of the stomach lining. This irritation can result from increased acid production (gastroesophageal reflux disease), infection, food poisoning, taking certain medicines (such as nonsteroidal anti-inflammatory drugs), alcohol use, or tobacco use.  Signals from the brain.These signals could be caused by a headache, heat exposure, an inner ear disturbance, increased pressure in the brain from injury, infection, a tumor, or a concussion, pain, emotional stimulus, or metabolic problems.  An obstruction in the gastrointestinal tract (bowel obstruction).  Illnesses such as diabetes, hepatitis, gallbladder problems, appendicitis, kidney problems, cancer, sepsis, atypical symptoms of a heart attack, or eating disorders.  Medical treatments such as chemotherapy and radiation.  Receiving medicine that makes you sleep (general anesthetic) during surgery. DIAGNOSIS Your caregiver may ask for tests to be done if the problems do not improve after a few days. Tests may also be done if symptoms are severe or if the reason for the nausea and vomiting is not clear. Tests may include:  Urine tests.  Blood tests.  Stool tests.  Cultures (to look for evidence of infection).  X-rays or other imaging studies. Test results can help your caregiver make decisions about treatment or the need for additional tests. TREATMENT You need to stay well hydrated. Drink frequently but in small amounts.You may wish to drink water, sports drinks, clear broth, or eat frozen  ice pops or gelatin dessert to help stay hydrated.When you eat, eating slowly may help prevent nausea.There are also some antinausea medicines that may help prevent nausea. HOME CARE INSTRUCTIONS   Take all medicine as directed by your caregiver.  If you do not have an appetite, do not force yourself to eat. However, you must continue to drink fluids.  If you have an appetite, eat a normal diet unless your caregiver tells you differently.  Eat a variety of complex carbohydrates (rice, wheat, potatoes, bread), lean meats, yogurt, fruits, and vegetables.  Avoid high-fat foods because they are more difficult to digest.  Drink enough water and fluids to keep your urine clear or pale yellow.  If you are dehydrated, ask your caregiver for specific rehydration instructions. Signs of dehydration may include:  Severe thirst.  Dry lips and mouth.  Dizziness.  Dark urine.  Decreasing urine frequency and amount.  Confusion.  Rapid breathing or pulse. SEEK IMMEDIATE MEDICAL CARE IF:   You have blood or brown flecks (like coffee grounds) in your vomit.  You have black or bloody stools.  You have a severe headache or stiff neck.  You are confused.  You have severe abdominal pain.  You have chest pain or trouble breathing.  You do not urinate at least once every 8 hours.  You develop cold or clammy skin.  You continue to vomit for longer than 24 to 48 hours.  You have a fever. MAKE SURE YOU:   Understand these instructions.  Will watch your condition.  Will get help right away if you are not doing well or get worse.   This information is not intended to replace advice given to you by your health care provider. Make sure  you discuss any questions you have with your health care provider.   Document Released: 03/12/2005 Document Revised: 06/04/2011 Document Reviewed: 08/09/2010 Elsevier Interactive Patient Education 2016 Elsevier Inc.  Musculoskeletal  Pain Musculoskeletal pain is muscle and boney aches and pains. These pains can occur in any part of the body. Your caregiver may treat you without knowing the cause of the pain. They may treat you if blood or urine tests, X-rays, and other tests were normal.  CAUSES There is often not a definite cause or reason for these pains. These pains may be caused by a type of germ (virus). The discomfort may also come from overuse. Overuse includes working out too hard when your body is not fit. Boney aches also come from weather changes. Bone is sensitive to atmospheric pressure changes. HOME CARE INSTRUCTIONS   Ask when your test results will be ready. Make sure you get your test results.  Only take over-the-counter or prescription medicines for pain, discomfort, or fever as directed by your caregiver. If you were given medications for your condition, do not drive, operate machinery or power tools, or sign legal documents for 24 hours. Do not drink alcohol. Do not take sleeping pills or other medications that may interfere with treatment.  Continue all activities unless the activities cause more pain. When the pain lessens, slowly resume normal activities. Gradually increase the intensity and duration of the activities or exercise.  During periods of severe pain, bed rest may be helpful. Lay or sit in any position that is comfortable.  Putting ice on the injured area.  Put ice in a bag.  Place a towel between your skin and the bag.  Leave the ice on for 15 to 20 minutes, 3 to 4 times a day.  Follow up with your caregiver for continued problems and no reason can be found for the pain. If the pain becomes worse or does not go away, it may be necessary to repeat tests or do additional testing. Your caregiver may need to look further for a possible cause. SEEK IMMEDIATE MEDICAL CARE IF:  You have pain that is getting worse and is not relieved by medications.  You develop chest pain that is associated  with shortness or breath, sweating, feeling sick to your stomach (nauseous), or throw up (vomit).  Your pain becomes localized to the abdomen.  You develop any new symptoms that seem different or that concern you. MAKE SURE YOU:   Understand these instructions.  Will watch your condition.  Will get help right away if you are not doing well or get worse.   This information is not intended to replace advice given to you by your health care provider. Make sure you discuss any questions you have with your health care provider.   Document Released: 03/12/2005 Document Revised: 06/04/2011 Document Reviewed: 11/14/2012 Elsevier Interactive Patient Education [employer].

## 2015-02-22 NOTE — ED Notes (Signed)
Pt returned from ultrasound

## 2015-02-28 ENCOUNTER — Ambulatory Visit (INDEPENDENT_AMBULATORY_CARE_PROVIDER_SITE_OTHER): Payer: Managed Care, Other (non HMO) | Admitting: Family Medicine

## 2015-02-28 ENCOUNTER — Encounter: Payer: Self-pay | Admitting: Family Medicine

## 2015-02-28 VITALS — BP 107/64 | HR 81 | Resp 16 | Ht 68.0 in | Wt 171.6 lb

## 2015-02-28 DIAGNOSIS — K529 Noninfective gastroenteritis and colitis, unspecified: Secondary | ICD-10-CM

## 2015-02-28 DIAGNOSIS — R748 Abnormal levels of other serum enzymes: Secondary | ICD-10-CM

## 2015-02-28 NOTE — Progress Notes (Signed)
Name: Adrian Zavala   MRN: [id number]    DOB: Jul 12, 1950   Date:02/28/2015       Progress Note  Subjective  Chief Complaint  Chief Complaint  Patient presents with  . Emesis    fu ER 02/21/2015.    HPI Here for f/u of ER visit for prolonged N/V.  Was in  ER on 11/28 and slept on 11/29, and went back to work 11/30.  Now feels fine.  BAck on all routine meds.  No problem-specific assessment & plan notes found for this encounter.   Past Medical History  Diagnosis Date  . 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[place])   . GERD (gastroesophageal reflux disease)   . Osteoporosis     Femur  . Osteopenia   . Hypothyroidism     graves  . 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[place])   . Hypercholesteremia   . Arthritis     Spine, Left Hip, Hands  . Neuromuscular disorder ([place])     some leg weakness S/P Chemo for Wegener's  . Lung disorder     S/P chemo for Wegener's  . Seasonal allergies     Social History  Substance Use Topics  . Smoking status: Former Smoker    Quit date: 08/24/1996  . Smokeless tobacco: Not on file  . Alcohol Use: 1.8 oz/week    3 Cans of beer per week     Current outpatient prescriptions:  .  acetaminophen (TYLENOL) 650 MG CR tablet, Take 350 mg by mouth daily as needed for pain or fever. , Disp: , Rfl:  .  alendronate (FOSAMAX) 70 MG tablet, Take 1 tablet by mouth once every week, Disp: 12 tablet, Rfl: 3 .  aspirin 81 MG tablet, Take 81 mg by mouth daily. AM, Disp: , Rfl:  .  calcium carbonate (OS-CAL) 600 MG TABS tablet, Take 600 mg by mouth daily. AM, Disp: , Rfl:  .  folic acid (FOLVITE) 517 MCG tablet, Take 400 mcg by mouth daily. AM, Disp: , Rfl:  .  loratadine (CLARITIN) 10 MG tablet, Take 10 mg by mouth daily as needed for allergies., Disp: , Rfl:  .  meloxicam (MOBIC) 15 MG tablet, Take 15 mg by mouth daily., Disp: , Rfl:  .  methotrexate (RHEUMATREX) 2.5 MG tablet, Take 15 mg by mouth once a week. Sat or Sun PM, Disp: , Rfl:  .  niacin (NIASPAN) 500 MG CR tablet, Take 750 mg by  mouth at bedtime., Disp: , Rfl:  .  predniSONE (DELTASONE) 5 MG tablet, Take 5 mg by mouth daily., Disp: , Rfl:  .  ranitidine (ZANTAC) 150 MG capsule, Take 1 capsule (150 mg total) by mouth daily as needed for heartburn., Disp: 90 capsule, Rfl: 3 .  Vitamin D, Cholecalciferol, 400 UNITS CAPS, Take 400 Units by mouth daily., Disp: , Rfl:   Allergies  Allergen Reactions  . Sulfa Antibiotics Anaphylaxis  . Sulfamethoxazole-Trimethoprim Anaphylaxis  . Penicillins Other (See Comments)    Passes out  . Codeine Itching, Other (See Comments) and Rash    tingling Rash and dizziness  . Prilosec [Omeprazole] Hives and Rash  . Tetracycline Rash  . Tetracyclines & Related Rash    Review of Systems  Constitutional: Negative for fever, chills, weight loss and malaise/fatigue.  HENT: Negative for hearing loss.   Eyes: Negative for blurred vision and double vision.  Respiratory: Negative for cough, shortness of breath and wheezing.   Cardiovascular: Negative for chest pain, palpitations and leg swelling.  Gastrointestinal: Negative for heartburn, abdominal  pain and blood in stool.  Genitourinary: Negative for dysuria, urgency and frequency.  Skin: Negative for rash.  Neurological: Negative for weakness and headaches.      Objective  Filed Vitals:   02/28/15 1547  BP: 107/64  Pulse: 81  Resp: 16  Height: 5' 8" (1.727 m)  Weight: 171 lb 9.6 oz (77.837 kg)     Physical Exam  Constitutional: He is oriented to person, place, and time and well-developed, well-nourished, and in no distress. No distress.  HENT:  Head: Normocephalic and atraumatic.  Mouth/Throat:    Eyes: Conjunctivae and EOM are normal. Pupils are equal, round, and reactive to light. No scleral icterus.  Neck: Normal range of motion. Neck supple. No thyromegaly present.  Cardiovascular: Normal rate, regular rhythm and intact distal pulses.  Exam reveals no gallop and no friction rub.   Murmur heard.  Systolic murmur  is present with a grade of 2/6  ursb  Pulmonary/Chest: Effort normal and breath sounds normal. No respiratory distress. He has no wheezes. He exhibits no tenderness.  Abdominal: Soft. Bowel sounds are normal. He exhibits no distension and no mass. There is no tenderness.  Musculoskeletal: He exhibits no edema.  Lymphadenopathy:    He has no cervical adenopathy.  Neurological: He is alert and oriented to person, place, and time.  Vitals reviewed.     Recent Results (from the past 2160 hour(s))  Comprehensive Metabolic Panel (CMET)     Status: None   Collection Time: 01/04/15  8:40 AM  Result Value Ref Range   Glucose 86 65 - 99 mg/dL   BUN 20 8 - 27 mg/dL   Creatinine, Ser 0.98 0.76 - 1.27 mg/dL   GFR calc non Af Amer 82 >59 mL/min/1.73   GFR calc Af Amer 94 >59 mL/min/1.73   BUN/Creatinine Ratio 20 10 - 22   Sodium 142 134 - 144 mmol/L    Comment: **Effective January 10, 2015 the reference interval**   for Sodium, Serum will be changing to:                                             136 - 144    Potassium 4.4 3.5 - 5.2 mmol/L    Comment: **Effective January 10, 2015 the reference interval**   for Potassium, Serum will be changing to:                          0 -  7 days        3.7 - 5.2                          8 - 30 days        3.7 - 6.4                          1 -  6 months      3.8 - 6.0                   7 months -  1 year        3.8 - 5.3                              >  1 year        3.5 - 5.2    Chloride 105 97 - 108 mmol/L    Comment: **Effective January 10, 2015 the reference interval**   for Chloride, Serum will be changing to:                                              97 - 106    CO2 24 18 - 29 mmol/L   Calcium 10.2 8.6 - 10.2 mg/dL   Total Protein 6.6 6.0 - 8.5 g/dL   Albumin 4.7 3.6 - 4.8 g/dL   Globulin, Total 1.9 1.5 - 4.5 g/dL   Albumin/Globulin Ratio 2.5 1.1 - 2.5   Bilirubin Total 0.7 0.0 - 1.2 mg/dL   Alkaline Phosphatase 85 39 - 117 IU/L   AST  27 0 - 40 IU/L   ALT 30 0 - 44 IU/L  Lipid Profile     Status: None   Collection Time: 01/04/15  8:40 AM  Result Value Ref Range   Cholesterol, Total 146 100 - 199 mg/dL   Triglycerides 102 0 - 149 mg/dL   HDL 56 >39 mg/dL    Comment: According to ATP-III Guidelines, HDL-C >59 mg/dL is considered a negative risk factor for CHD.    VLDL Cholesterol Cal 20 5 - 40 mg/dL   LDL Calculated 70 0 - 99 mg/dL   Chol/HDL Ratio 2.6 0.0 - 5.0 ratio units    Comment:                                   T. Chol/HDL Ratio                                             Men  Women                               1/2 Avg.Risk  3.4    3.3                                   Avg.Risk  5.0    4.4                                2X Avg.Risk  9.6    7.1                                3X Avg.Risk 23.4   11.0   TSH     Status: None   Collection Time: 01/04/15  8:40 AM  Result Value Ref Range   TSH 1.510 0.450 - 4.500 uIU/mL  CBC     Status: Abnormal   Collection Time: 02/22/15  4:21 AM  Result Value Ref Range   WBC 11.5 (H) 3.8 - 10.6 K/uL   RBC 4.79 4.40 - 5.90 MIL/uL   Hemoglobin 15.7 13.0 - 18.0 g/dL   HCT 47.9 40.0 - 52.0 %   MCV 100.0  80.0 - 100.0 fL   MCH 32.7 26.0 - 34.0 pg   MCHC 32.7 32.0 - 36.0 g/dL   RDW 14.2 11.5 - 14.5 %   Platelets 230 150 - 440 K/uL  Comprehensive metabolic panel     Status: Abnormal   Collection Time: 02/22/15  4:21 AM  Result Value Ref Range   Sodium 145 135 - 145 mmol/L   Potassium 4.3 3.5 - 5.1 mmol/L   Chloride 108 101 - 111 mmol/L   CO2 27 22 - 32 mmol/L   Glucose, Bld 127 (H) 65 - 99 mg/dL   BUN 38 (H) 6 - 20 mg/dL   Creatinine, Ser 1.02 0.61 - 1.24 mg/dL   Calcium 9.9 8.9 - 10.3 mg/dL   Total Protein 6.9 6.5 - 8.1 g/dL   Albumin 4.4 3.5 - 5.0 g/dL   AST 419 (H) 15 - 41 U/L   ALT 255 (H) 17 - 63 U/L   Alkaline Phosphatase 92 38 - 126 U/L   Total Bilirubin 1.8 (H) 0.3 - 1.2 mg/dL   GFR calc non Af Amer >60 >60 mL/min   GFR calc Af Amer >60 >60 mL/min     Comment: (NOTE) The eGFR has been calculated using the CKD EPI equation. This calculation has not been validated in all clinical situations. eGFR's persistently <60 mL/min signify possible Chronic Kidney Disease.    Anion gap 10 5 - 15  Troponin I     Status: None   Collection Time: 02/22/15  4:21 AM  Result Value Ref Range   Troponin I <0.03 <0.031 ng/mL    Comment:        NO INDICATION OF MYOCARDIAL INJURY.   Lipase, blood     Status: None   Collection Time: 02/22/15  4:21 AM  Result Value Ref Range   Lipase 47 11 - 51 U/L     Assessment & Plan  1. Gastroenteritis  - CBC with Differential  2. Elevated liver enzymes  - Comprehensive Metabolic Panel (CMET)

## 2015-03-01 LAB — CBC WITH DIFFERENTIAL/PLATELET
BASOS: 1 %
Basophils Absolute: 0.1 10*3/uL (ref 0.0–0.2)
EOS (ABSOLUTE): 0.4 10*3/uL (ref 0.0–0.4)
EOS: 4 %
HEMATOCRIT: 41.2 % (ref 37.5–51.0)
Hemoglobin: 14.2 g/dL (ref 12.6–17.7)
Immature Grans (Abs): 0.1 10*3/uL (ref 0.0–0.1)
Immature Granulocytes: 1 %
Lymphocytes Absolute: 2 10*3/uL (ref 0.7–3.1)
Lymphs: 22 %
MCH: 33.6 pg — ABNORMAL HIGH (ref 26.6–33.0)
MCHC: 34.5 g/dL (ref 31.5–35.7)
MCV: 97 fL (ref 79–97)
MONOS ABS: 0.8 10*3/uL (ref 0.1–0.9)
Monocytes: 9 %
Neutrophils Absolute: 5.7 10*3/uL (ref 1.4–7.0)
Neutrophils: 63 %
PLATELETS: 288 10*3/uL (ref 150–379)
RBC: 4.23 x10E6/uL (ref 4.14–5.80)
RDW: 14.2 % (ref 12.3–15.4)
WBC: 9 10*3/uL (ref 3.4–10.8)

## 2015-03-02 LAB — COMPREHENSIVE METABOLIC PANEL
ALBUMIN: 4.7 g/dL (ref 3.6–4.8)
ALK PHOS: 86 IU/L (ref 39–117)
ALT: 97 IU/L — AB (ref 0–44)
AST: 27 IU/L (ref 0–40)
Albumin/Globulin Ratio: 2.8 — ABNORMAL HIGH (ref 1.1–2.5)
BILIRUBIN TOTAL: 0.8 mg/dL (ref 0.0–1.2)
BUN / CREAT RATIO: 20 (ref 10–22)
BUN: 19 mg/dL (ref 8–27)
CHLORIDE: 104 mmol/L (ref 97–106)
CO2: 24 mmol/L (ref 18–29)
Calcium: 10 mg/dL (ref 8.6–10.2)
Creatinine, Ser: 0.94 mg/dL (ref 0.76–1.27)
GFR calc non Af Amer: 86 mL/min/{1.73_m2} (ref >59)
GFR, EST AFRICAN AMERICAN: 99 mL/min/{1.73_m2} (ref >59)
GLUCOSE: 86 mg/dL (ref 65–99)
Globulin, Total: 1.7 g/dL (ref 1.5–4.5)
Potassium: 4.4 mmol/L (ref 3.5–5.2)
Sodium: 144 mmol/L (ref 136–144)
TOTAL PROTEIN: 6.4 g/dL (ref 6.0–8.5)

## 2015-04-04 ENCOUNTER — Encounter: Payer: Self-pay | Admitting: Cardiovascular Disease

## 2015-04-04 ENCOUNTER — Ambulatory Visit (INDEPENDENT_AMBULATORY_CARE_PROVIDER_SITE_OTHER): Payer: Managed Care, Other (non HMO) | Admitting: Cardiovascular Disease

## 2015-04-04 ENCOUNTER — Other Ambulatory Visit (INDEPENDENT_AMBULATORY_CARE_PROVIDER_SITE_OTHER): Payer: Managed Care, Other (non HMO)

## 2015-04-04 ENCOUNTER — Other Ambulatory Visit: Payer: Self-pay

## 2015-04-04 VITALS — BP 150/80 | HR 71 | Ht 68.0 in | Wt 171.5 lb

## 2015-04-04 DIAGNOSIS — I10 Essential (primary) hypertension: Secondary | ICD-10-CM

## 2015-04-04 DIAGNOSIS — R011 Cardiac murmur, unspecified: Secondary | ICD-10-CM

## 2015-04-04 DIAGNOSIS — I350 Nonrheumatic aortic (valve) stenosis: Secondary | ICD-10-CM | POA: Diagnosis not present

## 2015-04-04 DIAGNOSIS — E785 Hyperlipidemia, unspecified: Secondary | ICD-10-CM | POA: Diagnosis not present

## 2015-04-04 DIAGNOSIS — M3130 Wegener's granulomatosis without renal involvement: Secondary | ICD-10-CM

## 2015-04-04 NOTE — Assessment & Plan Note (Signed)
Cholesterol is at goal on the current lipid regimen. No changes to the medications were made.  

## 2015-04-04 NOTE — Progress Notes (Signed)
Patient ID: Adrian Zavala, male    DOB: 1950/04/25, 65 y.o.   MRN: [id number]  HPI Comments: Mr. Schear is a very pleasant 65 year old gentleman with a history of Wegener's, s/p chemo, Cushing's, long history of smoking who stopped in 1998, hyperlipidemia on a statin, on chronic prednisone, presenting for evaluation of a murmur Patient of Dr. Luan Pulling  He reports first having murmur diagnosed in his 30s Denies any significant symptoms. Very active at baseline Likes to lots of housework, denies shortness of breath, leg edema, chest discomfort on exertion Seen recently by Dr. Luan Pulling and murmur felt to be louder than its baseline  We discussed his prior history with him including Wegener's, long history of chemotherapy, on chronic methotrexate once per week, on chronic prednisone down to 5 mg daily.  Reports he's been on cholesterol medication since the 1980s, when it first came out  Prior history and the [employer], Previous issues with gallbladder disease in his 17s  Recent episode of nausea vomiting in November 2016  Prior EKG shows sinus tachycardia with no significant ST or T-wave changes (in the setting of GI distress)   Allergies  Allergen Reactions  . Sulfa Antibiotics Anaphylaxis  . Sulfamethoxazole-Trimethoprim Anaphylaxis  . Penicillins Other (See Comments)    Passes out  . Codeine Itching, Other (See Comments) and Rash    tingling Rash and dizziness  . Prilosec [Omeprazole] Hives and Rash  . Tetracycline Rash  . Tetracyclines & Related Rash    Current Outpatient Prescriptions on File Prior to Visit  Medication Sig Dispense Refill  . acetaminophen (TYLENOL) 650 MG CR tablet Take 350 mg by mouth daily as needed for pain or fever.     Marland Kitchen alendronate (FOSAMAX) 70 MG tablet Take 1 tablet by mouth once every week 12 tablet 3  . aspirin 81 MG tablet Take 81 mg by mouth daily. AM    . calcium carbonate (OS-CAL) 600 MG TABS tablet Take 600 mg by mouth daily. AM    . folic  acid (FOLVITE) 034 MCG tablet Take 400 mcg by mouth daily. AM    . loratadine (CLARITIN) 10 MG tablet Take 10 mg by mouth daily as needed for allergies.    . meloxicam (MOBIC) 15 MG tablet Take 15 mg by mouth daily.    . methotrexate (RHEUMATREX) 2.5 MG tablet Take 15 mg by mouth once a week. Sat or Sun PM    . niacin (NIASPAN) 500 MG CR tablet Take 750 mg by mouth at bedtime.    . predniSONE (DELTASONE) 5 MG tablet Take 5 mg by mouth daily.    . ranitidine (ZANTAC) 150 MG capsule Take 1 capsule (150 mg total) by mouth daily as needed for heartburn. 90 capsule 3  . Vitamin D, Cholecalciferol, 400 UNITS CAPS Take 400 Units by mouth daily.     No current facility-administered medications on file prior to visit.    Past Medical History  Diagnosis Date  . 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 GERD (gastroesophageal reflux disease)   . Osteoporosis     Femur  . Osteopenia   . Hypothyroidism     graves  . 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[place])   . Hypercholesteremia   . Arthritis     Spine, Left Hip, Hands  . Neuromuscular disorder ([place])     some leg weakness S/P Chemo for Wegener's  . Lung disorder     S/P chemo for Wegener's  . Seasonal allergies   . Heart murmur  Past Surgical History  Procedure Laterality Date  . Treatment fistula anal      closure with rectal advancement flap  . Cholecystectomy      has "metal sutures"   . Colonoscopy N/A 10/25/2014    Procedure: COLONOSCOPY;  Surgeon: Lucilla Lame, MD;  Location: [place];  Service: Gastroenterology;  Laterality: N/A;    Social History  reports that he quit smoking about 18 years ago. He does not have any smokeless tobacco history on file. He reports that he drinks about 1.8 oz of alcohol per week.  Family History Family history is unknown by patient.      Review of Systems  Constitutional: Negative.   Respiratory: Negative.   Cardiovascular: Negative.   Gastrointestinal: Negative.   Musculoskeletal: Negative.    Neurological: Negative.   Hematological: Negative.   Psychiatric/Behavioral: Negative.   All other systems reviewed and are negative.    BP 150/80 mmHg  Pulse 71  Ht 5' 8"  (1.727 m)  Wt 171 lb 8 oz (77.792 kg)  BMI 26.08 kg/m2   Physical Exam  Constitutional: He is oriented to person, place, and time. He appears well-developed and well-nourished.  HENT:  Head: Normocephalic.  Nose: Nose normal.  Mouth/Throat: Oropharynx is clear and moist.  Eyes: Conjunctivae are normal. Pupils are equal, round, and reactive to light.  Neck: Normal range of motion. Neck supple. No JVD present.  Cardiovascular: Normal rate, regular rhythm and intact distal pulses.  Exam reveals no gallop and no friction rub.   Murmur heard.  Systolic murmur is present with a grade of 2/6  Pulmonary/Chest: Effort normal and breath sounds normal. No respiratory distress. He has no wheezes. He has no rales. He exhibits no tenderness.  Abdominal: Soft. Bowel sounds are normal. He exhibits no distension. There is no tenderness.  Musculoskeletal: Normal range of motion. He exhibits no edema or tenderness.  Lymphadenopathy:    He has no cervical adenopathy.  Neurological: He is alert and oriented to person, place, and time. Coordination normal.  Skin: Skin is warm and dry. No rash noted. No erythema.  Psychiatric: He has a normal mood and affect. His behavior is normal. Judgment and thought content normal.

## 2015-04-04 NOTE — Assessment & Plan Note (Signed)
Blood pressure is well controlled on today's visit. No changes made to the medications. 

## 2015-04-04 NOTE — Assessment & Plan Note (Signed)
Clinical exam suggest aortic valve disease Unable to exclude bicuspid aortic valve. Details of valve pathology discussed with him Could potentially be aortic valve sclerosis with mild stenosis Echocardiogram has been ordered Currently asymptomatic

## 2015-04-04 NOTE — Assessment & Plan Note (Signed)
Reports he is stable on methotrexate once per week, chronic prednisone

## 2015-04-04 NOTE — Patient Instructions (Addendum)
You are doing well. No medication changes were made.  Research CT coronary calcium score, screen for coronary disease  We will schedule an echocardiogram for aortic valve murmur, stenosis We will call you with the results  Please call us if you have new issues that need to be addressed before your next appt.  Your physician wants you to follow-up in: 12 months.  You will receive a reminder letter in the mail two months in advance. If you don't receive a letter, please call our office to schedule the follow-up appointment.    Echocardiogram An echocardiogram, or echocardiography, uses sound waves (ultrasound) to produce an image of your heart. The echocardiogram is simple, painless, obtained within a short period of time, and offers valuable information to your health care provider. The images from an echocardiogram can provide information such as:  Evidence of coronary artery disease (CAD).  Heart size.  Heart muscle function.  Heart valve function.  Aneurysm detection.  Evidence of a past heart attack.  Fluid buildup around the heart.  Heart muscle thickening.  Assess heart valve function. LET [facility] CARE PROVIDER KNOW ABOUT:  Any allergies you have.  All medicines you are taking, including vitamins, herbs, eye drops, creams, and over-the-counter medicines.  Previous problems you or members of your family have had with the use of anesthetics.  Any blood disorders you have.  Previous surgeries you have had.  Medical conditions you have.  Possibility of pregnancy, if this applies. BEFORE THE PROCEDURE  No special preparation is needed. Eat and drink normally.  PROCEDURE   In order to produce an image of your heart, gel will be applied to your chest and a wand-like tool (transducer) will be moved over your chest. The gel will help transmit the sound waves from the transducer. The sound waves will harmlessly bounce off your heart to allow the heart images to be  captured in real-time motion. These images will then be recorded.  You may need an IV to receive a medicine that improves the quality of the pictures. AFTER THE PROCEDURE You may return to your normal schedule including diet, activities, and medicines, unless your health care provider tells you otherwise.   This information is not intended to replace advice given to you by your health care provider. Make sure you discuss any questions you have with your health care provider.   Document Released: 03/09/2000 Document Revised: 04/02/2014 Document Reviewed: 11/17/2012 Elsevier Interactive Patient Education [employer].

## 2015-05-09 ENCOUNTER — Ambulatory Visit (INDEPENDENT_AMBULATORY_CARE_PROVIDER_SITE_OTHER): Payer: Managed Care, Other (non HMO) | Admitting: Family Medicine

## 2015-05-09 ENCOUNTER — Encounter: Payer: Self-pay | Admitting: Family Medicine

## 2015-05-09 VITALS — BP 128/76 | HR 71 | Resp 16 | Ht 68.0 in | Wt 171.6 lb

## 2015-05-09 DIAGNOSIS — M3130 Wegener's granulomatosis without renal involvement: Secondary | ICD-10-CM | POA: Insufficient documentation

## 2015-05-09 DIAGNOSIS — I10 Essential (primary) hypertension: Secondary | ICD-10-CM

## 2015-05-09 DIAGNOSIS — K760 Fatty (change of) liver, not elsewhere classified: Secondary | ICD-10-CM

## 2015-05-09 DIAGNOSIS — E079 Disorder of thyroid, unspecified: Secondary | ICD-10-CM | POA: Diagnosis not present

## 2015-05-09 NOTE — Progress Notes (Signed)
Name: Adrian Zavala   MRN: [id number]    DOB: [birth year]   Date:05/09/2015       Progress Note  Subjective  Chief Complaint  Chief Complaint  Patient presents with  . Hypothyroidism    Follow Up echo and thyroid    HPI Here for f/u of hypothyroidism.  Takes Methotrexate for Wegeners granulamotosis.  Seen by Dr. Lindon Romp.  Has fatty liver disease.    No problem-specific assessment & plan notes found for this encounter.   Past Medical History  Diagnosis Date  . 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 GERD (gastroesophageal reflux disease)   . Osteoporosis     Femur  . Osteopenia   . Hypothyroidism     graves  . 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[place])   . Hypercholesteremia   . Arthritis     Spine, Left Hip, Hands  . Neuromuscular disorder ([place])     some leg weakness S/P Chemo for Wegener's  . Lung disorder     S/P chemo for Wegener's  . Seasonal allergies   . Heart murmur     Past Surgical History  Procedure Laterality Date  . Treatment fistula anal      closure with rectal advancement flap  . Cholecystectomy      has "metal sutures"   . Colonoscopy N/A 10/25/2014    Procedure: COLONOSCOPY;  Surgeon: Lucilla Lame, MD;  Location: [place];  Service: Gastroenterology;  Laterality: N/A;    Family History  Problem Relation Age of Onset  . Family history unknown: Yes    Social History   Social History  . Marital Status: Married    Spouse Name: N/A  . Number of Children: N/A  . Years of Education: N/A   Occupational History  . Not on file.   Social History Main Topics  . Smoking status: Former Smoker    Quit date: 08/24/1996  . Smokeless tobacco: Not on file  . Alcohol Use: 1.8 oz/week    3 Cans of beer per week  . Drug Use: Not on file  . Sexual Activity: Not on file   Other Topics Concern  . Not on file   Social History Narrative     Current outpatient prescriptions:  .  acetaminophen (TYLENOL) 650 MG CR tablet, Take 350 mg by mouth daily as needed for  pain or fever. , Disp: , Rfl:  .  alendronate (FOSAMAX) 70 MG tablet, Take 1 tablet by mouth once every week, Disp: 12 tablet, Rfl: 3 .  aspirin 81 MG tablet, Take 81 mg by mouth daily. AM, Disp: , Rfl:  .  atorvastatin (LIPITOR) 20 MG tablet, Take 20 mg by mouth daily at 6 PM. , Disp: , Rfl:  .  calcium carbonate (OS-CAL) 600 MG TABS tablet, Take 600 mg by mouth daily. AM, Disp: , Rfl:  .  folic acid (FOLVITE) 893 MCG tablet, Take 400 mcg by mouth daily. AM, Disp: , Rfl:  .  levothyroxine (SYNTHROID, LEVOTHROID) 25 MCG tablet, Take 25 mcg by mouth daily before breakfast., Disp: , Rfl:  .  loratadine (CLARITIN) 10 MG tablet, Take 10 mg by mouth daily as needed for allergies., Disp: , Rfl:  .  meloxicam (MOBIC) 15 MG tablet, Take 15 mg by mouth daily., Disp: , Rfl:  .  methotrexate (RHEUMATREX) 2.5 MG tablet, Take 15 mg by mouth once a week. Sat or Sun PM, Disp: , Rfl:  .  niacin (NIASPAN) 500 MG CR tablet, Take 750 mg by mouth  at bedtime., Disp: , Rfl:  .  predniSONE (DELTASONE) 5 MG tablet, Take 5 mg by mouth daily., Disp: , Rfl:  .  ranitidine (ZANTAC) 150 MG capsule, Take 1 capsule (150 mg total) by mouth daily as needed for heartburn., Disp: 90 capsule, Rfl: 3 .  Vitamin D, Cholecalciferol, 400 UNITS CAPS, Take 400 Units by mouth daily., Disp: , Rfl:   Allergies  Allergen Reactions  . Sulfa Antibiotics Anaphylaxis  . Sulfamethoxazole-Trimethoprim Anaphylaxis  . Penicillins Other (See Comments)    Passes out  . Codeine Itching, Other (See Comments) and Rash    tingling Rash and dizziness  . Prilosec [Omeprazole] Hives and Rash  . Tetracycline Rash  . Tetracyclines & Related Rash     Review of Systems  Constitutional: Negative for fever, chills, weight loss and malaise/fatigue.  HENT: Negative for hearing loss.   Eyes: Negative for blurred vision and double vision.  Respiratory: Negative for cough, shortness of breath and wheezing.   Cardiovascular: Negative for chest pain,  palpitations and leg swelling.  Gastrointestinal: Negative for heartburn, abdominal pain and blood in stool.  Genitourinary: Negative for dysuria, urgency and frequency.  Musculoskeletal: Positive for myalgias (on and off).  Skin: Negative for rash.  Neurological: Negative for dizziness, tremors, weakness and headaches.      Objective  Filed Vitals:   05/09/15 0944  BP: 128/76  Pulse: 71  Resp: 16  Height: 5' 8"  (1.727 m)  Weight: 171 lb 9.6 oz (77.837 kg)    Physical Exam  Constitutional: He is oriented to person, place, and time and well-developed, well-nourished, and in no distress. No distress.  HENT:  Head: Normocephalic.  Eyes: Conjunctivae are normal. Pupils are equal, round, and reactive to light. No scleral icterus.  Neck: Normal range of motion. Neck supple. Carotid bruit is not present. No thyromegaly present.  Cardiovascular: Normal rate and regular rhythm.  Exam reveals no gallop and no friction rub.   Murmur heard.  Systolic murmur is present with a grade of 3/6  URSB  Pulmonary/Chest: Effort normal and breath sounds normal. No respiratory distress. He has no wheezes. He has no rales.  Abdominal: Soft. Bowel sounds are normal. He exhibits no distension and no mass. There is no tenderness.  Musculoskeletal: He exhibits no edema.  Lymphadenopathy:    He has no cervical adenopathy.  Neurological: He is alert and oriented to person, place, and time.  Vitals reviewed.      Recent Results (from the past 2160 hour(s))  CBC     Status: Abnormal   Collection Time: 02/22/15  4:21 AM  Result Value Ref Range   WBC 11.5 (H) 3.8 - 10.6 K/uL   RBC 4.79 4.40 - 5.90 MIL/uL   Hemoglobin 15.7 13.0 - 18.0 g/dL   HCT 47.9 40.0 - 52.0 %   MCV 100.0 80.0 - 100.0 fL   MCH 32.7 26.0 - 34.0 pg   MCHC 32.7 32.0 - 36.0 g/dL   RDW 14.2 11.5 - 14.5 %   Platelets 230 150 - 440 K/uL  Comprehensive metabolic panel     Status: Abnormal   Collection Time: 02/22/15  4:21 AM   Result Value Ref Range   Sodium 145 135 - 145 mmol/L   Potassium 4.3 3.5 - 5.1 mmol/L   Chloride 108 101 - 111 mmol/L   CO2 27 22 - 32 mmol/L   Glucose, Bld 127 (H) 65 - 99 mg/dL   BUN 38 (H) 6 - 20 mg/dL  Creatinine, Ser 1.02 0.61 - 1.24 mg/dL   Calcium 9.9 8.9 - 10.3 mg/dL   Total Protein 6.9 6.5 - 8.1 g/dL   Albumin 4.4 3.5 - 5.0 g/dL   AST 419 (H) 15 - 41 U/L   ALT 255 (H) 17 - 63 U/L   Alkaline Phosphatase 92 38 - 126 U/L   Total Bilirubin 1.8 (H) 0.3 - 1.2 mg/dL   GFR calc non Af Amer >60 >60 mL/min   GFR calc Af Amer >60 >60 mL/min    Comment: (NOTE) The eGFR has been calculated using the CKD EPI equation. This calculation has not been validated in all clinical situations. eGFR's persistently <60 mL/min signify possible Chronic Kidney Disease.    Anion gap 10 5 - 15  Troponin I     Status: None   Collection Time: 02/22/15  4:21 AM  Result Value Ref Range   Troponin I <0.03 <0.031 ng/mL    Comment:        NO INDICATION OF MYOCARDIAL INJURY.   Lipase, blood     Status: None   Collection Time: 02/22/15  4:21 AM  Result Value Ref Range   Lipase 47 11 - 51 U/L  CBC with Differential     Status: Abnormal   Collection Time: 03/01/15  8:09 AM  Result Value Ref Range   WBC 9.0 3.4 - 10.8 x10E3/uL   RBC 4.23 4.14 - 5.80 x10E6/uL   Hemoglobin 14.2 12.6 - 17.7 g/dL   Hematocrit 41.2 37.5 - 51.0 %   MCV 97 79 - 97 fL   MCH 33.6 (H) 26.6 - 33.0 pg   MCHC 34.5 31.5 - 35.7 g/dL   RDW 14.2 12.3 - 15.4 %   Platelets 288 150 - 379 x10E3/uL   Neutrophils 63 %   Lymphs 22 %   Monocytes 9 %   Eos 4 %   Basos 1 %   Neutrophils Absolute 5.7 1.4 - 7.0 x10E3/uL   Lymphocytes Absolute 2.0 0.7 - 3.1 x10E3/uL   Monocytes Absolute 0.8 0.1 - 0.9 x10E3/uL   EOS (ABSOLUTE) 0.4 0.0 - 0.4 x10E3/uL   Basophils Absolute 0.1 0.0 - 0.2 x10E3/uL   Immature Granulocytes 1 %   Immature Grans (Abs) 0.1 0.0 - 0.1 x10E3/uL  Comprehensive Metabolic Panel (CMET)     Status: Abnormal    Collection Time: 03/01/15  8:09 AM  Result Value Ref Range   Glucose 86 65 - 99 mg/dL   BUN 19 8 - 27 mg/dL   Creatinine, Ser 0.94 0.76 - 1.27 mg/dL   GFR calc non Af Amer 86 >59 mL/min/1.73   GFR calc Af Amer 99 >59 mL/min/1.73   BUN/Creatinine Ratio 20 10 - 22   Sodium 144 136 - 144 mmol/L    Comment: **Effective March 07, 2015 the reference interval**   for Sodium, Serum will be changing to:                                             134 - 144    Potassium 4.4 3.5 - 5.2 mmol/L   Chloride 104 97 - 106 mmol/L    Comment: **Effective March 07, 2015 the reference interval**   for Chloride, Serum will be changing to:  96 - 106    CO2 24 18 - 29 mmol/L   Calcium 10.0 8.6 - 10.2 mg/dL   Total Protein 6.4 6.0 - 8.5 g/dL   Albumin 4.7 3.6 - 4.8 g/dL   Globulin, Total 1.7 1.5 - 4.5 g/dL   Albumin/Globulin Ratio 2.8 (H) 1.1 - 2.5   Bilirubin Total 0.8 0.0 - 1.2 mg/dL   Alkaline Phosphatase 86 39 - 117 IU/L   AST 27 0 - 40 IU/L   ALT 97 (H) 0 - 44 IU/L     Assessment & Plan  Problem List Items Addressed This Visit      Cardiovascular and Mediastinum   BP (high blood pressure)   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HCC)     Digestive   Fatty liver disease, nonalcoholic   Relevant Orders   Comprehensive Metabolic Panel (CMET)     Endocrine   Disease of thyroid gland - Primary   Relevant Orders   TSH     1. Disease of thyroid gland  - TSH  2. Essential hypertension   3. Fatty liver disease, nonalcoholic  - Comprehensive Metabolic Panel (CMET)  4. 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   Continue all current medications

## 2015-05-17 LAB — COMPREHENSIVE METABOLIC PANEL
ALBUMIN: 4.7 g/dL (ref 3.6–4.8)
ALK PHOS: 64 IU/L (ref 39–117)
ALT: 40 IU/L (ref 0–44)
AST: 23 IU/L (ref 0–40)
Albumin/Globulin Ratio: 2.6 — ABNORMAL HIGH (ref 1.1–2.5)
BUN / CREAT RATIO: 22 (ref 10–22)
BUN: 21 mg/dL (ref 8–27)
Bilirubin Total: 0.7 mg/dL (ref 0.0–1.2)
CHLORIDE: 104 mmol/L (ref 96–106)
CO2: 25 mmol/L (ref 18–29)
Calcium: 10.3 mg/dL — ABNORMAL HIGH (ref 8.6–10.2)
Creatinine, Ser: 0.96 mg/dL (ref 0.76–1.27)
GFR calc Af Amer: 97 mL/min/{1.73_m2} (ref >59)
GFR calc non Af Amer: 84 mL/min/{1.73_m2} (ref >59)
Globulin, Total: 1.8 g/dL (ref 1.5–4.5)
Glucose: 96 mg/dL (ref 65–99)
Potassium: 4.5 mmol/L (ref 3.5–5.2)
Sodium: 143 mmol/L (ref 134–144)
Total Protein: 6.5 g/dL (ref 6.0–8.5)

## 2015-05-17 LAB — TSH: TSH: 1.47 u[IU]/mL (ref 0.450–4.500)

## 2015-06-06 ENCOUNTER — Encounter: Payer: Self-pay | Admitting: Family Medicine

## 2015-06-06 ENCOUNTER — Ambulatory Visit (INDEPENDENT_AMBULATORY_CARE_PROVIDER_SITE_OTHER): Payer: Managed Care, Other (non HMO) | Admitting: Family Medicine

## 2015-06-06 VITALS — BP 98/59 | HR 80 | Resp 16 | Ht 68.0 in | Wt 172.4 lb

## 2015-06-06 DIAGNOSIS — K529 Noninfective gastroenteritis and colitis, unspecified: Secondary | ICD-10-CM | POA: Diagnosis not present

## 2015-06-06 DIAGNOSIS — M791 Myalgia, unspecified site: Secondary | ICD-10-CM | POA: Insufficient documentation

## 2015-06-06 DIAGNOSIS — E785 Hyperlipidemia, unspecified: Secondary | ICD-10-CM

## 2015-06-06 DIAGNOSIS — E079 Disorder of thyroid, unspecified: Secondary | ICD-10-CM | POA: Diagnosis not present

## 2015-06-06 DIAGNOSIS — I10 Essential (primary) hypertension: Secondary | ICD-10-CM

## 2015-06-06 DIAGNOSIS — Z0000 Encounter for general adult medical examination without abnormal findings: Secondary | ICD-10-CM | POA: Diagnosis not present

## 2015-06-06 DIAGNOSIS — M3130 Wegener's granulomatosis without renal involvement: Secondary | ICD-10-CM

## 2015-06-06 DIAGNOSIS — N400 Enlarged prostate without lower urinary tract symptoms: Secondary | ICD-10-CM | POA: Diagnosis not present

## 2015-06-06 NOTE — Progress Notes (Signed)
Name: Adrian Zavala   MRN: [id number]    DOB: 1951/01/03   Date:06/06/2015       Progress Note  Subjective  Chief Complaint  Chief Complaint  Patient presents with  . Annual Exam    HPI Here for annual physical exam (health maintenance).  He has Wegner's Granulomatosis, osteoporosis.  No problem-specific assessment & plan notes found for this encounter.   Past Medical History  Diagnosis Date  . 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 GERD (gastroesophageal reflux disease)   . Osteoporosis     Femur  . Osteopenia   . Hypothyroidism     graves  . 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[place])   . Hypercholesteremia   . Arthritis     Spine, Left Hip, Hands  . Neuromuscular disorder ([place])     some leg weakness S/P Chemo for Wegener's  . Lung disorder     S/P chemo for Wegener's  . Seasonal allergies   . Heart murmur     Past Surgical History  Procedure Laterality Date  . Treatment fistula anal      closure with rectal advancement flap  . Cholecystectomy      has "metal sutures"   . Colonoscopy N/A 10/25/2014    Procedure: COLONOSCOPY;  Surgeon: Lucilla Lame, MD;  Location: [place];  Service: Gastroenterology;  Laterality: N/A;    Family History  Problem Relation Age of Onset  . Family history unknown: Yes    Social History   Social History  . Marital Status: Married    Spouse Name: N/A  . Number of Children: N/A  . Years of Education: N/A   Occupational History  . Not on file.   Social History Main Topics  . Smoking status: Former Smoker    Quit date: 08/24/1996  . Smokeless tobacco: Not on file  . Alcohol Use: 1.8 oz/week    3 Cans of beer per week  . Drug Use: Not on file  . Sexual Activity: Not on file   Other Topics Concern  . Not on file   Social History Narrative     Current outpatient prescriptions:  .  acetaminophen (TYLENOL) 650 MG CR tablet, Take 350 mg by mouth daily as needed for pain or fever. , Disp: , Rfl:  .  alendronate (FOSAMAX) 70 MG tablet,  Take 1 tablet by mouth once every week, Disp: 12 tablet, Rfl: 3 .  aspirin 81 MG tablet, Take 81 mg by mouth daily. AM, Disp: , Rfl:  .  calcium carbonate (OS-CAL) 600 MG TABS tablet, Take 600 mg by mouth daily. AM, Disp: , Rfl:  .  folic acid (FOLVITE) 782 MCG tablet, Take 400 mcg by mouth daily. AM, Disp: , Rfl:  .  levothyroxine (SYNTHROID, LEVOTHROID) 25 MCG tablet, Take 25 mcg by mouth daily before breakfast., Disp: , Rfl:  .  loratadine (CLARITIN) 10 MG tablet, Take 10 mg by mouth daily as needed for allergies., Disp: , Rfl:  .  meloxicam (MOBIC) 15 MG tablet, Take 15 mg by mouth daily., Disp: , Rfl:  .  methotrexate (RHEUMATREX) 2.5 MG tablet, Take 15 mg by mouth once a week. Sat or Sun PM, Disp: , Rfl:  .  niacin (NIASPAN) 500 MG CR tablet, Take 750 mg by mouth at bedtime., Disp: , Rfl:  .  predniSONE (DELTASONE) 5 MG tablet, Take 5 mg by mouth daily., Disp: , Rfl:  .  ranitidine (ZANTAC) 150 MG capsule, Take 1 capsule (150 mg total) by mouth daily  as needed for heartburn., Disp: 90 capsule, Rfl: 3 .  Vitamin D, Cholecalciferol, 400 UNITS CAPS, Take 400 Units by mouth daily., Disp: , Rfl:   Allergies  Allergen Reactions  . Sulfa Antibiotics Anaphylaxis  . Sulfamethoxazole-Trimethoprim Anaphylaxis  . Penicillins Other (See Comments)    Passes out  . Codeine Itching, Other (See Comments) and Rash    tingling Rash and dizziness  . Prilosec [Omeprazole] Hives and Rash  . Tetracycline Rash  . Tetracyclines & Related Rash     Review of Systems  Constitutional: Negative for fever, chills, weight loss and malaise/fatigue.  HENT: Negative for hearing loss.   Eyes: Negative for blurred vision and double vision.  Respiratory: Negative for cough, hemoptysis, shortness of breath and wheezing.   Cardiovascular: Negative for chest pain, palpitations and leg swelling.  Gastrointestinal: Negative for heartburn, abdominal pain and blood in stool.  Genitourinary: Negative for dysuria, urgency  and frequency.  Musculoskeletal: Positive for myalgias and joint pain.       Hips, hands, ribs, back pain.  Skin: Negative for rash.  Neurological: Negative for dizziness, tremors, weakness and headaches.      Objective  Filed Vitals:   06/06/15 1410  BP: 98/59  Pulse: 80  Resp: 16  Height: 5' 8"  (1.727 m)  Weight: 172 lb 6.4 oz (78.2 kg)    Physical Exam  Constitutional: He is oriented to person, place, and time. No distress.  HENT:  Head: Normocephalic and atraumatic.  Right Ear: External ear normal.  Left Ear: External ear normal.  Nose: Nose normal.  Mouth/Throat: Oropharynx is clear and moist.  Eyes: Conjunctivae and EOM are normal. Pupils are equal, round, and reactive to light. No scleral icterus.  Neck: Normal range of motion. Neck supple. Carotid bruit is not present. No thyromegaly present.  Cardiovascular: Normal rate.   Murmur heard.  Systolic murmur is present with a grade of 3/6  URSB  Pulmonary/Chest: Effort normal and breath sounds normal. No respiratory distress. He has no wheezes. He has no rales.  Abdominal: Soft. Bowel sounds are normal. He exhibits no distension, no abdominal bruit and no mass. There is no tenderness.  Genitourinary: Rectum normal and penis normal.  Prostate is 1+ enlarged, symmetrical, non-tender, and without nodules.  Musculoskeletal: He exhibits no edema.  Lymphadenopathy:    He has no cervical adenopathy.  Neurological: He is alert and oriented to person, place, and time.  Skin: Skin is warm and dry.  Psychiatric: Mood, memory, affect and judgment normal.  Vitals reviewed.      Recent Results (from the past 2160 hour(s))  Comprehensive Metabolic Panel (CMET)     Status: Abnormal   Collection Time: 05/16/15  8:55 AM  Result Value Ref Range   Glucose 96 65 - 99 mg/dL   BUN 21 8 - 27 mg/dL   Creatinine, Ser 0.96 0.76 - 1.27 mg/dL   GFR calc non Af Amer 84 >59 mL/min/1.73   GFR calc Af Amer 97 >59 mL/min/1.73    BUN/Creatinine Ratio 22 10 - 22   Sodium 143 134 - 144 mmol/L   Potassium 4.5 3.5 - 5.2 mmol/L   Chloride 104 96 - 106 mmol/L   CO2 25 18 - 29 mmol/L   Calcium 10.3 (H) 8.6 - 10.2 mg/dL   Total Protein 6.5 6.0 - 8.5 g/dL   Albumin 4.7 3.6 - 4.8 g/dL   Globulin, Total 1.8 1.5 - 4.5 g/dL   Albumin/Globulin Ratio 2.6 (H) 1.1 - 2.5  Comment: **Effective June 06, 2015 the reference interval**   for A/G Ratio will be changing to:              Age                Male          Male           0 -  7 days       1.1 - 2.3       1.1 - 2.3           8 - 30 days       1.2 - 2.8       1.2 - 2.8           1 -  6 months     1.3 - 3.6       1.3 - 3.6    7 months -  5 years      1.5 - 2.6       1.5 - 2.6              > 5 years      1.2 - 2.2       1.2 - 2.2    Bilirubin Total 0.7 0.0 - 1.2 mg/dL   Alkaline Phosphatase 64 39 - 117 IU/L   AST 23 0 - 40 IU/L   ALT 40 0 - 44 IU/L  TSH     Status: None   Collection Time: 05/16/15  8:55 AM  Result Value Ref Range   TSH 1.470 0.450 - 4.500 uIU/mL     Assessment & Plan  Problem List Items Addressed This Visit      Cardiovascular and Mediastinum   BP (high blood pressure)   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HCC)   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HCC)     Digestive   Colitis     Endocrine   Disease of thyroid gland     Genitourinary   BPH (benign prostatic hyperplasia)   Relevant Orders   PSA     Other   HLD (hyperlipidemia)   Muscle pain    Other Visit Diagnoses    Health maintenance examination    -  Primary       No orders of the defined types were placed in this encounter.   1. Health maintenance examination   2. HLD (hyperlipidemia)   3. Essential hypertension   4. 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)   5. 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[place])   6. Colitis   7. Disease of thyroid gland   8. BPH (benign prostatic hyperplasia)  - PSA  9. Muscle pain Hold Lipitor for 1 month

## 2015-06-12 ENCOUNTER — Other Ambulatory Visit: Payer: Self-pay | Admitting: Family Medicine

## 2015-06-14 LAB — PSA: Prostate Specific Ag, Serum: 0.7 ng/mL (ref 0.0–4.0)

## 2015-07-27 ENCOUNTER — Other Ambulatory Visit: Payer: Self-pay | Admitting: Family Medicine

## 2015-08-15 ENCOUNTER — Encounter: Payer: Self-pay | Admitting: Family Medicine

## 2015-08-15 ENCOUNTER — Ambulatory Visit (INDEPENDENT_AMBULATORY_CARE_PROVIDER_SITE_OTHER): Payer: Managed Care, Other (non HMO) | Admitting: Family Medicine

## 2015-08-15 VITALS — BP 135/70 | HR 72 | Temp 98.3°F | Resp 16 | Ht 68.0 in | Wt 172.0 lb

## 2015-08-15 DIAGNOSIS — E785 Hyperlipidemia, unspecified: Secondary | ICD-10-CM

## 2015-08-15 DIAGNOSIS — I10 Essential (primary) hypertension: Secondary | ICD-10-CM | POA: Diagnosis not present

## 2015-08-15 DIAGNOSIS — M3130 Wegener's granulomatosis without renal involvement: Secondary | ICD-10-CM | POA: Diagnosis not present

## 2015-08-15 NOTE — Progress Notes (Signed)
Name: Adrian Zavala   MRN: [id number]    DOB: 06/17/1950   Date:08/15/2015       Progress Note  Subjective  Chief Complaint  Chief Complaint  Patient presents with  . Hyperlipidemia    stopped lipitor due to muscle aches. Per patient resolved somewhat not 100%.    HPI Here for f/u of Musculo-skelatal pain.  He says that the musculoskeletal pain is 75% better off the Lipitor.  Still has some bad pain in  Rib cage occ.  HE is going to ask Dr. Jefm Bryant for rest from Methotrexate at his next visit there.  Overall feeling pretty well overall.  He has been off Lipitor for 2 months  Has a place on his back that he wants me to look at.  No problem-specific assessment & plan notes found for this encounter.   Past Medical History  Diagnosis Date  . 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 GERD (gastroesophageal reflux disease)   . Osteoporosis     Femur  . Osteopenia   . Hypothyroidism     graves  . 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[employer])   . Hypercholesteremia   . Arthritis     Spine, Left Hip, Hands  . Neuromuscular disorder ([place])     some leg weakness S/P Chemo for Wegener's  . Lung disorder     S/P chemo for Wegener's  . Seasonal allergies   . Heart murmur     Past Surgical History  Procedure Laterality Date  . Treatment fistula anal      closure with rectal advancement flap  . Cholecystectomy      has "metal sutures"   . Colonoscopy N/A 10/25/2014    Procedure: COLONOSCOPY;  Surgeon: Lucilla Lame, MD;  Location: [place];  Service: Gastroenterology;  Laterality: N/A;    Family History  Problem Relation Age of Onset  . Family history unknown: Yes    Social History   Social History  . Marital Status: Married    Spouse Name: N/A  . Number of Children: N/A  . Years of Education: N/A   Occupational History  . Not on file.   Social History Main Topics  . Smoking status: Former Smoker    Quit date: 08/24/1996  . Smokeless tobacco: Not on file  . Alcohol Use: 1.8 oz/week     3 Cans of beer per week  . Drug Use: Not on file  . Sexual Activity: Not on file   Other Topics Concern  . Not on file   Social History Narrative     Current outpatient prescriptions:  .  acetaminophen (TYLENOL) 650 MG CR tablet, Take 350 mg by mouth daily as needed for pain or fever. , Disp: , Rfl:  .  alendronate (FOSAMAX) 70 MG tablet, Take 1 tablet by mouth once every week, Disp: 12 tablet, Rfl: 3 .  aspirin 81 MG tablet, Take 81 mg by mouth daily. AM, Disp: , Rfl:  .  calcium carbonate (OS-CAL) 600 MG TABS tablet, Take 600 mg by mouth daily. AM, Disp: , Rfl:  .  folic acid (FOLVITE) 259 MCG tablet, Take 400 mcg by mouth daily. AM, Disp: , Rfl:  .  levothyroxine (SYNTHROID, LEVOTHROID) 25 MCG tablet, Take 1 tablet by mouth  daily, Disp: 90 tablet, Rfl: 3 .  loratadine (CLARITIN) 10 MG tablet, Take 10 mg by mouth daily as needed for allergies., Disp: , Rfl:  .  magnesium oxide (MAG-OX) 400 MG tablet, Take 400 mg by mouth daily., Disp: ,  Rfl:  .  meloxicam (MOBIC) 15 MG tablet, Take 1 tablet by mouth  daily, Disp: 90 tablet, Rfl: 3 .  methotrexate (RHEUMATREX) 2.5 MG tablet, Take 15 mg by mouth once a week. Sat or Sun PM, Disp: , Rfl:  .  niacin (NIASPAN) 500 MG CR tablet, Take 750 mg by mouth at bedtime., Disp: , Rfl:  .  predniSONE (DELTASONE) 5 MG tablet, Take 5 mg by mouth daily., Disp: , Rfl:  .  ranitidine (ZANTAC) 150 MG capsule, Take 1 capsule (150 mg total) by mouth daily as needed for heartburn., Disp: 90 capsule, Rfl: 3 .  Vitamin D, Cholecalciferol, 400 UNITS CAPS, Take 400 Units by mouth daily., Disp: , Rfl:   Allergies  Allergen Reactions  . Sulfa Antibiotics Anaphylaxis  . Sulfamethoxazole-Trimethoprim Anaphylaxis  . Penicillins Other (See Comments)    Passes out  . Codeine Itching, Other (See Comments) and Rash    tingling Rash and dizziness  . Prilosec [Omeprazole] Hives and Rash  . Tetracycline Rash  . Tetracyclines & Related Rash     Review of Systems   Constitutional: Negative for fever, chills, weight loss and malaise/fatigue.  HENT: Negative for hearing loss.   Eyes: Negative for blurred vision and double vision.  Respiratory: Negative for cough, hemoptysis, shortness of breath and wheezing.   Cardiovascular: Negative for chest pain (does have some ms chest pain occ.), palpitations and leg swelling.  Gastrointestinal: Negative for heartburn, abdominal pain and blood in stool.  Genitourinary: Negative for dysuria, urgency and frequency.  Musculoskeletal: Positive for myalgias (occ).  Skin: Negative for rash.  Neurological: Negative for dizziness, tremors, weakness and headaches.      Objective  Filed Vitals:   08/15/15 0900 08/15/15 0927  BP: 146/82 135/70  Pulse: 72   Temp: 98.3 F (36.8 C)   TempSrc: Oral   Resp: 16   Height: 5' 8"  (1.727 m)   Weight: 172 lb (78.019 kg)     Physical Exam  Constitutional: He is oriented to person, place, and time and well-developed, well-nourished, and in no distress. No distress.  HENT:  Head: Normocephalic and atraumatic.  Eyes: Conjunctivae and EOM are normal. Pupils are equal, round, and reactive to light. No scleral icterus.  Neck: Normal range of motion. Neck supple. Carotid bruit is not present. No thyromegaly present.  Cardiovascular: Normal rate and regular rhythm.  Exam reveals no gallop and no friction rub.   Murmur heard.  Systolic murmur is present with a grade of 3/6  URSB  Pulmonary/Chest: Effort normal and breath sounds normal. No respiratory distress. He has no wheezes. He has no rales.  Abdominal: Soft. Bowel sounds are normal. He exhibits no distension, no abdominal bruit and no mass. There is no tenderness.  Musculoskeletal: He exhibits no edema.  Lymphadenopathy:    He has no cervical adenopathy.  Neurological: He is alert and oriented to person, place, and time.  Skin:  0.5 mm x 3 mm linear raw area on R scapular area, that is mildly irritated.  No mass.  No  celluitis  Vitals reviewed.      Recent Results (from the past 2160 hour(s))  PSA     Status: None   Collection Time: 06/13/15  9:24 AM  Result Value Ref Range   Prostate Specific Ag, Serum 0.7 0.0 - 4.0 ng/mL    Comment: Roche ECLIA methodology. According to the American Urological Association, Serum PSA should decrease and remain at undetectable levels after radical prostatectomy. The AUA  defines biochemical recurrence as an initial PSA value 0.2 ng/mL or greater followed by a subsequent confirmatory PSA value 0.2 ng/mL or greater. Values obtained with different assay methods or kits cannot be used interchangeably. Results cannot be interpreted as absolute evidence of the presence or absence of malignant disease.      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 magnesium oxide (MAG-OX) 400 MG tablet    Sig: Take 400 mg by mouth daily.   1. Essential hypertension   2. HLD (hyperlipidemia)  - Comprehensive Metabolic Panel (CMET) - Lipid Profile  3. 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 With Dr. Lindon Romp - CBC with Differential  Cont. All current meds. RTC-3 months

## 2015-08-26 ENCOUNTER — Telehealth: Payer: Self-pay | Admitting: *Deleted

## 2015-08-26 NOTE — Telephone Encounter (Signed)
Called patient to remind about labwork. Made patient aware that we now have lab option here at [facility]. Patient says he prefers to use [facility].

## 2015-08-29 NOTE — Telephone Encounter (Signed)
Ok.  His choice.  Just be sure we can get results from [facility] now.-jh

## 2015-08-30 LAB — CBC WITH DIFFERENTIAL/PLATELET
BASOS: 1 %
Basophils Absolute: 0.1 10*3/uL (ref 0.0–0.2)
EOS (ABSOLUTE): 0.5 10*3/uL — AB (ref 0.0–0.4)
EOS: 7 %
HEMATOCRIT: 40.4 % (ref 37.5–51.0)
Hemoglobin: 14.1 g/dL (ref 12.6–17.7)
Immature Grans (Abs): 0.1 10*3/uL (ref 0.0–0.1)
Immature Granulocytes: 1 %
Lymphocytes Absolute: 1.6 10*3/uL (ref 0.7–3.1)
Lymphs: 23 %
MCH: 33.6 pg — AB (ref 26.6–33.0)
MCHC: 34.9 g/dL (ref 31.5–35.7)
MCV: 96 fL (ref 79–97)
MONOS ABS: 0.9 10*3/uL (ref 0.1–0.9)
Monocytes: 12 %
NEUTROS ABS: 4 10*3/uL (ref 1.4–7.0)
Neutrophils: 56 %
PLATELETS: 221 10*3/uL (ref 150–379)
RBC: 4.2 x10E6/uL (ref 4.14–5.80)
RDW: 13.6 % (ref 12.3–15.4)
WBC: 7.1 10*3/uL (ref 3.4–10.8)

## 2015-08-30 LAB — LIPID PANEL
CHOL/HDL RATIO: 3.4 ratio (ref 0.0–5.0)
Cholesterol, Total: 199 mg/dL (ref 100–199)
HDL: 59 mg/dL (ref >39)
LDL CALC: 103 mg/dL — AB (ref 0–99)
TRIGLYCERIDES: 186 mg/dL — AB (ref 0–149)
VLDL CHOLESTEROL CAL: 37 mg/dL (ref 5–40)

## 2015-08-30 LAB — COMPREHENSIVE METABOLIC PANEL
A/G RATIO: 2.2 (ref 1.2–2.2)
ALT: 36 IU/L (ref 0–44)
AST: 17 IU/L (ref 0–40)
Albumin: 4.4 g/dL (ref 3.6–4.8)
Alkaline Phosphatase: 69 IU/L (ref 39–117)
BILIRUBIN TOTAL: 0.6 mg/dL (ref 0.0–1.2)
BUN/Creatinine Ratio: 27 — ABNORMAL HIGH (ref 10–24)
BUN: 24 mg/dL (ref 8–27)
CALCIUM: 9.9 mg/dL (ref 8.6–10.2)
CO2: 21 mmol/L (ref 18–29)
CREATININE: 0.88 mg/dL (ref 0.76–1.27)
Chloride: 105 mmol/L (ref 96–106)
GFR calc Af Amer: 106 mL/min/{1.73_m2} (ref >59)
GFR, EST NON AFRICAN AMERICAN: 91 mL/min/{1.73_m2} (ref >59)
GLOBULIN, TOTAL: 2 g/dL (ref 1.5–4.5)
Glucose: 83 mg/dL (ref 65–99)
Potassium: 4.3 mmol/L (ref 3.5–5.2)
SODIUM: 144 mmol/L (ref 134–144)
Total Protein: 6.4 g/dL (ref 6.0–8.5)

## 2015-09-30 ENCOUNTER — Ambulatory Visit (INDEPENDENT_AMBULATORY_CARE_PROVIDER_SITE_OTHER): Payer: Managed Care, Other (non HMO) | Admitting: Family Medicine

## 2015-09-30 ENCOUNTER — Encounter: Payer: Self-pay | Admitting: Family Medicine

## 2015-09-30 VITALS — BP 125/71 | HR 64 | Temp 97.9°F | Resp 16 | Ht 68.0 in | Wt 178.0 lb

## 2015-09-30 DIAGNOSIS — D692 Other nonthrombocytopenic purpura: Secondary | ICD-10-CM

## 2015-09-30 NOTE — Progress Notes (Signed)
Subjective:    Patient ID: Adrian Zavala, male    DOB: 1950/08/28, 65 y.o.   MRN: [id number]  HPI: Adrian Zavala is a 65 y.o. male presenting on 09/30/2015 for spot on R arm   HPI  Pt presents for spot on L forearm. Purple spot. Appeared this week. Non-painful. Non tender. Pt does take aspirin daily. Has noticed some change in skin thickness. Pt is concerned it could be melanoma.    Past Medical History  Diagnosis Date  . 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 GERD (gastroesophageal reflux disease)   . Osteoporosis     Femur  . Osteopenia   . Hypothyroidism     graves  . 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[place])   . Hypercholesteremia   . Arthritis     Spine, Left Hip, Hands  . Neuromuscular disorder ([employer])     some leg weakness S/P Chemo for Wegener's  . Lung disorder     S/P chemo for Wegener's  . Seasonal allergies   . Heart murmur     Current Outpatient Prescriptions on File Prior to Visit  Medication Sig  . acetaminophen (TYLENOL) 650 MG CR tablet Take 350 mg by mouth daily as needed for pain or fever.   Marland Kitchen alendronate (FOSAMAX) 70 MG tablet Take 1 tablet by mouth once every week  . aspirin 81 MG tablet Take 81 mg by mouth daily. AM  . calcium carbonate (OS-CAL) 600 MG TABS tablet Take 600 mg by mouth daily. AM  . folic acid (FOLVITE) 465 MCG tablet Take 400 mcg by mouth daily. AM  . levothyroxine (SYNTHROID, LEVOTHROID) 25 MCG tablet Take 1 tablet by mouth  daily  . loratadine (CLARITIN) 10 MG tablet Take 10 mg by mouth daily as needed for allergies.  . magnesium oxide (MAG-OX) 400 MG tablet Take 400 mg by mouth daily.  . meloxicam (MOBIC) 15 MG tablet Take 1 tablet by mouth  daily  . methotrexate (RHEUMATREX) 2.5 MG tablet Take 15 mg by mouth once a week. Sat or Sun PM  . niacin (NIASPAN) 500 MG CR tablet Take 750 mg by mouth at bedtime.  . predniSONE (DELTASONE) 5 MG tablet Take 5 mg by mouth daily.  . ranitidine (ZANTAC) 150 MG capsule Take 1 capsule (150 mg total) by mouth daily as  needed for heartburn.  . Vitamin D, Cholecalciferol, 400 UNITS CAPS Take 400 Units by mouth daily.   No current facility-administered medications on file prior to visit.    Review of Systems  Constitutional: Negative for fever and chills.  HENT: Negative.   Respiratory: Negative for chest tightness, shortness of breath and wheezing.   Cardiovascular: Negative for chest pain, palpitations and leg swelling.  Gastrointestinal: Negative for nausea, vomiting and abdominal pain.  Endocrine: Negative.   Genitourinary: Negative for dysuria, urgency, discharge, penile pain and testicular pain.  Musculoskeletal: Negative for back pain, joint swelling and arthralgias.  Skin: Positive for color change.  Neurological: Negative for dizziness, weakness, numbness and headaches.  Psychiatric/Behavioral: Negative for sleep disturbance and dysphoric mood.   Per HPI unless specifically indicated above     Objective:    BP 125/71 mmHg  Pulse 64  Temp(Src) 97.9 F (36.6 C) (Oral)  Resp 16  Ht 5' 8"  (1.727 m)  Wt 178 lb (80.74 kg)  BMI 27.07 kg/m2  Wt Readings from Last 3 Encounters:  09/30/15 178 lb (80.74 kg)  08/15/15 172 lb (78.019 kg)  06/06/15 172 lb 6.4 oz (78.2 kg)  Physical Exam  Constitutional: He is oriented to person, place, and time. He appears well-developed and well-nourished. No distress.  Musculoskeletal: Normal range of motion. He exhibits no edema.  Neurological: He is alert and oriented to person, place, and time.  Skin: Skin is warm and dry. Purpura (single purple macule on L forearm. Non tender. ) and rash noted. He is not diaphoretic.  Psychiatric: He has a normal mood and affect. His behavior is normal. Judgment and thought content normal.   Results for orders placed or performed in visit on 08/15/15  Comprehensive Metabolic Panel (CMET)  Result Value Ref Range   Glucose 83 65 - 99 mg/dL   BUN 24 8 - 27 mg/dL   Creatinine, Ser 0.88 0.76 - 1.27 mg/dL   GFR calc non  Af Amer 91 >59 mL/min/1.73   GFR calc Af Amer 106 >59 mL/min/1.73   BUN/Creatinine Ratio 27 (H) 10 - 24   Sodium 144 134 - 144 mmol/L   Potassium 4.3 3.5 - 5.2 mmol/L   Chloride 105 96 - 106 mmol/L   CO2 21 18 - 29 mmol/L   Calcium 9.9 8.6 - 10.2 mg/dL   Total Protein 6.4 6.0 - 8.5 g/dL   Albumin 4.4 3.6 - 4.8 g/dL   Globulin, Total 2.0 1.5 - 4.5 g/dL   Albumin/Globulin Ratio 2.2 1.2 - 2.2   Bilirubin Total 0.6 0.0 - 1.2 mg/dL   Alkaline Phosphatase 69 39 - 117 IU/L   AST 17 0 - 40 IU/L   ALT 36 0 - 44 IU/L  Lipid Profile  Result Value Ref Range   Cholesterol, Total 199 100 - 199 mg/dL   Triglycerides 186 (H) 0 - 149 mg/dL   HDL 59 >39 mg/dL   VLDL Cholesterol Cal 37 5 - 40 mg/dL   LDL Calculated 103 (H) 0 - 99 mg/dL   Chol/HDL Ratio 3.4 0.0 - 5.0 ratio units  CBC with Differential  Result Value Ref Range   WBC 7.1 3.4 - 10.8 x10E3/uL   RBC 4.20 4.14 - 5.80 x10E6/uL   Hemoglobin 14.1 12.6 - 17.7 g/dL   Hematocrit 40.4 37.5 - 51.0 %   MCV 96 79 - 97 fL   MCH 33.6 (H) 26.6 - 33.0 pg   MCHC 34.9 31.5 - 35.7 g/dL   RDW 13.6 12.3 - 15.4 %   Platelets 221 150 - 379 x10E3/uL   Neutrophils 56 %   Lymphs 23 %   Monocytes 12 %   Eos 7 %   Basos 1 %   Neutrophils Absolute 4.0 1.4 - 7.0 x10E3/uL   Lymphocytes Absolute 1.6 0.7 - 3.1 x10E3/uL   Monocytes Absolute 0.9 0.1 - 0.9 x10E3/uL   EOS (ABSOLUTE) 0.5 (H) 0.0 - 0.4 x10E3/uL   Basophils Absolute 0.1 0.0 - 0.2 x10E3/uL   Immature Granulocytes 1 %   Immature Grans (Abs) 0.1 0.0 - 0.1 x10E3/uL      Assessment & Plan:   Problem List Items Addressed This Visit    None    Visit Diagnoses    Purpura ([place])    -  Primary    Lesion is likely a purpura from aspirin. Will continue to monitor closely. Platelet count normal.  Consider dermatology referral for change in lesion.        No orders of the defined types were placed in this encounter.      Follow up plan: Return if symptoms worsen or fail to improve.

## 2015-09-30 NOTE — Patient Instructions (Signed)
The spot on your arm is just a purpura. This is benign. Your last CBC showed a normal platelet count. However if you noticed lots of these spots- please let us know. We will need to check some lab work.

## 2015-10-28 ENCOUNTER — Other Ambulatory Visit: Payer: Self-pay | Admitting: Family Medicine

## 2015-11-07 ENCOUNTER — Ambulatory Visit (INDEPENDENT_AMBULATORY_CARE_PROVIDER_SITE_OTHER): Payer: Managed Care, Other (non HMO) | Admitting: Family Medicine

## 2015-11-07 ENCOUNTER — Encounter: Payer: Self-pay | Admitting: Family Medicine

## 2015-11-07 ENCOUNTER — Ambulatory Visit: Payer: Managed Care, Other (non HMO) | Admitting: Family Medicine

## 2015-11-07 DIAGNOSIS — J0140 Acute pansinusitis, unspecified: Secondary | ICD-10-CM

## 2015-11-07 MED ORDER — LEVOFLOXACIN 500 MG PO TABS: 500.0000 mg | ORAL_TABLET | Freq: Every day | ORAL | 0 refills | Status: AC

## 2015-11-07 NOTE — Patient Instructions (Signed)
Use Mucinex 600 mg twice a day and use Fluticasone NS once daily

## 2015-11-07 NOTE — Progress Notes (Signed)
Name: Adrian Zavala   MRN: [id number]    DOB: [birth year]   Date:11/07/2015       Progress Note  Subjective  Chief Complaint  Chief Complaint  Patient presents with  . Facial Pain    x 3 days    HPI R sided facial pain o er frontal and maxillary sinus area x 3 days.  Comes and goes.  Upper teeth on right side hurt.  No rash noted.  No nasal drainage.  Some swelling of mucus membranes.  Uses Afrin occ with some relief.  No problem-specific Assessment & Plan notes found for this encounter.   Past Medical History:  Diagnosis Date  . Arthritis    Spine, Left Hip, Hands  . 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[place])   . GERD (gastroesophageal reflux disease)   . Heart murmur   . Hypercholesteremia   . Hypothyroidism    graves  . Lung disorder    S/P chemo for Wegener's  . Neuromuscular disorder ([place])    some leg weakness S/P Chemo for Wegener's  . Osteopenia   . Osteoporosis    Femur  . Seasonal allergies   . 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 Smoking status: Former Smoker    Quit date: 08/24/1996  . Smokeless tobacco: Never Used  . Alcohol use 1.8 oz/week    3 Cans of beer per week     Current Outpatient Prescriptions:  .  acetaminophen (TYLENOL) 650 MG CR tablet, Take 350 mg by mouth every 8 (eight) hours as needed for pain or fever. , Disp: , Rfl:  .  alendronate (FOSAMAX) 70 MG tablet, Take 1 tablet by mouth once every week, Disp: 12 tablet, Rfl: 3 .  aspirin 81 MG tablet, Take 81 mg by mouth daily. AM, Disp: , Rfl:  .  B Complex-C-Folic Acid (B-COMPLEX BALANCED PO), Take 1 tablet by mouth daily., Disp: , Rfl:  .  calcium carbonate (OS-CAL) 600 MG TABS tablet, Take 600 mg by mouth daily. AM, Disp: , Rfl:  .  folic acid (FOLVITE) 409 MCG tablet, Take 400 mcg by mouth daily. AM, Disp: , Rfl:  .  levothyroxine (SYNTHROID, LEVOTHROID) 25 MCG tablet, Take 1 tablet by mouth  daily, Disp: 90 tablet, Rfl: 3 .  loratadine (CLARITIN) 10 MG tablet, Take 10  mg by mouth daily as needed for allergies., Disp: , Rfl:  .  magnesium oxide (MAG-OX) 400 MG tablet, Take 400 mg by mouth daily., Disp: , Rfl:  .  meloxicam (MOBIC) 15 MG tablet, Take 1 tablet by mouth  daily, Disp: 90 tablet, Rfl: 3 .  methotrexate (RHEUMATREX) 2.5 MG tablet, Take 15 mg by mouth once a week. Sat or Sun PM, Disp: , Rfl:  .  niacin (NIASPAN) 500 MG CR tablet, Take 750 mg by mouth at bedtime., Disp: , Rfl:  .  predniSONE (DELTASONE) 5 MG tablet, Take 5 mg by mouth daily., Disp: , Rfl:  .  ranitidine (ZANTAC) 150 MG capsule, Take 1 capsule (150 mg total) by mouth daily as needed for heartburn., Disp: 90 capsule, Rfl: 3 .  Vitamin D, Cholecalciferol, 400 UNITS CAPS, Take 400 Units by mouth daily., Disp: , Rfl:  .  levofloxacin (LEVAQUIN) 500 MG tablet, Take 1 tablet (500 mg total) by mouth daily., Disp: 10 tablet, Rfl: 0  Allergies  Allergen Reactions  . Sulfa Antibiotics Anaphylaxis  . Sulfamethoxazole-Trimethoprim Anaphylaxis  . Penicillins Other (See Comments)    Passes out  .  Codeine Itching, Other (See Comments) and Rash    tingling Rash and dizziness  . Prilosec [Omeprazole] Hives and Rash  . Tetracycline Rash  . Tetracyclines & Related Rash    Review of Systems  Constitutional: Negative for chills, fever, malaise/fatigue and weight loss.  HENT: Positive for congestion. Negative for ear pain, hearing loss and sore throat.   Respiratory: Negative for cough.   Cardiovascular: Negative for chest pain.  Gastrointestinal: Negative.   Genitourinary: Negative.   Skin: Negative for rash.  Neurological: Positive for headaches. Negative for weakness.      Objective  Vitals:   11/07/15 1538  BP: (!) 147/69  Pulse: 72  Resp: 16  Temp: 98.2 F (36.8 C)  TempSrc: Oral  Weight: 179 lb 12.8 oz (81.6 kg)  Height: 5' 8"  (1.727 m)     Physical Exam  Constitutional: He is well-developed, well-nourished, and in no distress. No distress.  HENT:  Head: Normocephalic  and atraumatic.  Right Ear: External ear normal.  Left Ear: External ear normal.  Nose: Mucosal edema present. Right sinus exhibits maxillary sinus tenderness and frontal sinus tenderness. Left sinus exhibits no maxillary sinus tenderness and no frontal sinus tenderness.  Mouth/Throat: Oropharynx is clear and moist.  Neck: Carotid bruit is not present.  Cardiovascular: Normal rate and regular rhythm.   Murmur heard.  Systolic murmur is present with a grade of 3/6  URSB  Pulmonary/Chest: Effort normal and breath sounds normal.  Vitals reviewed.     Recent Results (from the past 2160 hour(s))  Comprehensive Metabolic Panel (CMET)     Status: Abnormal   Collection Time: 08/29/15  8:43 AM  Result Value Ref Range   Glucose 83 65 - 99 mg/dL   BUN 24 8 - 27 mg/dL   Creatinine, Ser 0.88 0.76 - 1.27 mg/dL   GFR calc non Af Amer 91 >59 mL/min/1.73   GFR calc Af Amer 106 >59 mL/min/1.73   BUN/Creatinine Ratio 27 (H) 10 - 24   Sodium 144 134 - 144 mmol/L   Potassium 4.3 3.5 - 5.2 mmol/L   Chloride 105 96 - 106 mmol/L   CO2 21 18 - 29 mmol/L   Calcium 9.9 8.6 - 10.2 mg/dL   Total Protein 6.4 6.0 - 8.5 g/dL   Albumin 4.4 3.6 - 4.8 g/dL   Globulin, Total 2.0 1.5 - 4.5 g/dL   Albumin/Globulin Ratio 2.2 1.2 - 2.2   Bilirubin Total 0.6 0.0 - 1.2 mg/dL   Alkaline Phosphatase 69 39 - 117 IU/L   AST 17 0 - 40 IU/L   ALT 36 0 - 44 IU/L  Lipid Profile     Status: Abnormal   Collection Time: 08/29/15  8:43 AM  Result Value Ref Range   Cholesterol, Total 199 100 - 199 mg/dL   Triglycerides 186 (H) 0 - 149 mg/dL   HDL 59 >39 mg/dL   VLDL Cholesterol Cal 37 5 - 40 mg/dL   LDL Calculated 103 (H) 0 - 99 mg/dL   Chol/HDL Ratio 3.4 0.0 - 5.0 ratio units    Comment:                                   T. Chol/HDL Ratio  Men  Women                               1/2 Avg.Risk  3.4    3.3                                   Avg.Risk  5.0    4.4                                 2X Avg.Risk  9.6    7.1                                3X Avg.Risk 23.4   11.0   CBC with Differential     Status: Abnormal   Collection Time: 08/29/15  8:43 AM  Result Value Ref Range   WBC 7.1 3.4 - 10.8 x10E3/uL   RBC 4.20 4.14 - 5.80 x10E6/uL   Hemoglobin 14.1 12.6 - 17.7 g/dL   Hematocrit 40.4 37.5 - 51.0 %   MCV 96 79 - 97 fL   MCH 33.6 (H) 26.6 - 33.0 pg   MCHC 34.9 31.5 - 35.7 g/dL   RDW 13.6 12.3 - 15.4 %   Platelets 221 150 - 379 x10E3/uL   Neutrophils 56 %   Lymphs 23 %   Monocytes 12 %   Eos 7 %   Basos 1 %   Neutrophils Absolute 4.0 1.4 - 7.0 x10E3/uL   Lymphocytes Absolute 1.6 0.7 - 3.1 x10E3/uL   Monocytes Absolute 0.9 0.1 - 0.9 x10E3/uL   EOS (ABSOLUTE) 0.5 (H) 0.0 - 0.4 x10E3/uL   Basophils Absolute 0.1 0.0 - 0.2 x10E3/uL   Immature Granulocytes 1 %   Immature Grans (Abs) 0.1 0.0 - 0.1 x10E3/uL     Assessment & Plan  1. Acute pansinusitis, recurrence not specified  - levofloxacin (LEVAQUIN) 500 MG tablet; Take 1 tablet (500 mg total) by mouth daily.  Dispense: 10 tablet; Refill: 0

## 2015-11-14 ENCOUNTER — Encounter: Payer: Self-pay | Admitting: Family Medicine

## 2015-12-05 ENCOUNTER — Ambulatory Visit: Payer: Managed Care, Other (non HMO) | Admitting: Family Medicine

## 2015-12-06 ENCOUNTER — Encounter: Payer: Self-pay | Admitting: Family Medicine

## 2015-12-12 ENCOUNTER — Other Ambulatory Visit: Payer: Self-pay | Admitting: Family Medicine

## 2015-12-12 ENCOUNTER — Ambulatory Visit (INDEPENDENT_AMBULATORY_CARE_PROVIDER_SITE_OTHER): Payer: Managed Care, Other (non HMO) | Admitting: Family Medicine

## 2015-12-12 ENCOUNTER — Encounter: Payer: Self-pay | Admitting: Family Medicine

## 2015-12-12 VITALS — BP 147/76 | HR 77 | Temp 98.2°F | Resp 16 | Ht 68.0 in | Wt 178.0 lb

## 2015-12-12 DIAGNOSIS — J0101 Acute recurrent maxillary sinusitis: Secondary | ICD-10-CM

## 2015-12-12 DIAGNOSIS — M3130 Wegener's granulomatosis without renal involvement: Secondary | ICD-10-CM | POA: Diagnosis not present

## 2015-12-12 DIAGNOSIS — I10 Essential (primary) hypertension: Secondary | ICD-10-CM

## 2015-12-12 DIAGNOSIS — Z23 Encounter for immunization: Secondary | ICD-10-CM | POA: Diagnosis not present

## 2015-12-12 NOTE — Progress Notes (Signed)
Name: Adrian Zavala   MRN: [id number]    DOB: 1950-09-19   Date:12/12/2015       Progress Note  Subjective  Chief Complaint  Chief Complaint  Patient presents with  . Sinusitis    HPI Here for f/u of sinus infection.   Has seen Dr. Tami Ribas and R trubinate was swollen.  He is going to recheck it about 2 weeks.  He may biopsy it in the future.  He has recommended using Flonase daily for rest of his life.  Wegener's was going ok.  No problem-specific Assessment & Plan notes found for this encounter.   Past Medical History:  Diagnosis Date  . Arthritis    Spine, Left Hip, Hands  . 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[place])   . GERD (gastroesophageal reflux disease)   . Heart murmur   . Hypercholesteremia   . Hypothyroidism    graves  . Lung disorder    S/P chemo for Wegener's  . Neuromuscular disorder ([place])    some leg weakness S/P Chemo for Wegener's  . Osteopenia   . Osteoporosis    Femur  . Seasonal allergies   . 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 CHOLECYSTECTOMY     has "metal sutures"   . COLONOSCOPY N/A 10/25/2014   Procedure: COLONOSCOPY;  Surgeon: Lucilla Lame, MD;  Location: [facility];  Service: Gastroenterology;  Laterality: N/A;  . TREATMENT FISTULA ANAL     closure with rectal advancement flap    Family History  Problem Relation Age of Onset  . Family history unknown: Yes    Social History   Social History  . Marital status: Married    Spouse name: N/A  . Number of children: N/A  . Years of education: N/A   Occupational History  . Not on file.   Social History Main Topics  . Smoking status: Former Smoker    Quit date: 08/24/1996  . Smokeless tobacco: Never Used  . Alcohol use 1.8 oz/week    3 Cans of beer per week  . Drug use: Unknown  . Sexual activity: Not on file   Other Topics Concern  . Not on file   Social History Narrative  . No narrative on file     Current Outpatient Prescriptions:   .  acetaminophen (TYLENOL) 650 MG CR tablet, Take 350 mg by mouth every 8 (eight) hours as needed for pain or fever. , Disp: , Rfl:  .  alendronate (FOSAMAX) 70 MG tablet, Take 1 tablet by mouth once every week, Disp: 12 tablet, Rfl: 3 .  aspirin 81 MG tablet, Take 81 mg by mouth daily. AM, Disp: , Rfl:  .  B Complex-C-Folic Acid (B-COMPLEX BALANCED PO), Take 1 tablet by mouth daily., Disp: , Rfl:  .  calcium carbonate (OS-CAL) 600 MG TABS tablet, Take 600 mg by mouth daily. AM, Disp: , Rfl:  .  folic acid (FOLVITE) 867 MCG tablet, Take 400 mcg by mouth daily. AM, Disp: , Rfl:  .  levothyroxine (SYNTHROID, LEVOTHROID) 25 MCG tablet, Take 1 tablet by mouth  daily, Disp: 90 tablet, Rfl: 3 .  loratadine (CLARITIN) 10 MG tablet, Take 10 mg by mouth daily as needed for allergies., Disp: , Rfl:  .  magnesium oxide (MAG-OX) 400 MG tablet, Take 400 mg by mouth daily., Disp: , Rfl:  .  meloxicam (MOBIC) 15 MG tablet, Take 1 tablet by mouth  daily, Disp: 90 tablet, Rfl: 3 .  methotrexate (RHEUMATREX) 2.5 MG tablet, Take 15 mg by mouth once a week. Sat or Sun PM, Disp: , Rfl:  .  niacin (NIASPAN) 500 MG CR tablet, Take 750 mg by mouth at bedtime., Disp: , Rfl:  .  predniSONE (DELTASONE) 5 MG tablet, Take 5 mg by mouth daily., Disp: , Rfl:  .  ranitidine (ZANTAC) 150 MG capsule, Take 1 capsule (150 mg total) by mouth daily as needed for heartburn., Disp: 90 capsule, Rfl: 3 .  Vitamin D, Cholecalciferol, 400 UNITS CAPS, Take 400 Units by mouth daily., Disp: , Rfl:   Allergies  Allergen Reactions  . Sulfa Antibiotics Anaphylaxis  . Sulfamethoxazole-Trimethoprim Anaphylaxis  . Penicillins Other (See Comments)    Passes out  . Codeine Itching, Other (See Comments) and Rash    tingling Rash and dizziness  . Prilosec [Omeprazole] Hives and Rash  . Tetracycline Rash  . Tetracyclines & Related Rash     Review of Systems  Constitutional: Negative for chills, fever, malaise/fatigue and weight loss.   HENT: Negative for hearing loss.   Eyes: Negative for blurred vision and double vision.  Respiratory: Negative for cough, shortness of breath and wheezing.   Cardiovascular: Negative for chest pain, palpitations and leg swelling.  Gastrointestinal: Negative for abdominal pain, blood in stool and heartburn.  Genitourinary: Negative for dysuria, frequency and urgency.  Musculoskeletal: Negative for joint pain and myalgias.  Skin: Negative for rash.  Neurological: Positive for headaches. Negative for dizziness, tremors and weakness.  Psychiatric/Behavioral: Negative for depression. The patient is not nervous/anxious and does not have insomnia.       Objective  Vitals:   12/12/15 0917  BP: (!) 147/76  Pulse: 77  Resp: 16  Temp: 98.2 F (36.8 C)  TempSrc: Oral  Weight: 178 lb (80.7 kg)  Height: 5' 8"  (1.727 m)    Physical Exam  Constitutional: He is oriented to person, place, and time and well-developed, well-nourished, and in no distress. No distress.  HENT:  Head: Normocephalic and atraumatic.  Nose: Mucosal edema (R turbinate swollen and tender) present.  Mouth/Throat: Oropharynx is clear and moist.  Eyes: Conjunctivae and EOM are normal. Pupils are equal, round, and reactive to light. No scleral icterus.  Neck: Normal range of motion. Neck supple. Carotid bruit is not present. No thyromegaly present.  Cardiovascular: Normal rate, regular rhythm and normal heart sounds.  Exam reveals no gallop and no friction rub.   No murmur heard. Pulmonary/Chest: Effort normal and breath sounds normal. No respiratory distress. He has no wheezes. He has no rales.  Abdominal: Soft. Bowel sounds are normal. He exhibits no distension and no mass. There is no tenderness.  Musculoskeletal: He exhibits no edema.  Lymphadenopathy:    He has no cervical adenopathy.  Neurological: He is alert and oriented to person, place, and time.  Vitals reviewed.      No results found for this or any  previous visit (from the past 2160 hour(s)).   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     No orders of the defined types were placed in this encounter.  1. Essential hypertension Cont meds 2. 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 Lindon Romp.  3. Recurrent maxillary sinusitis, unspecified chronicity Cont to f/u with  Dr. Tami Ribas

## 2015-12-13 ENCOUNTER — Encounter: Payer: Self-pay | Admitting: Family Medicine

## 2016-03-26 ENCOUNTER — Ambulatory Visit
Admission: EM | Admit: 2016-03-26 | Discharge: 2016-03-26 | Disposition: A | Discharge status: Home / Self Care | Payer: Managed Care, Other (non HMO) | Attending: Emergency Medicine | Admitting: Emergency Medicine

## 2016-03-26 DIAGNOSIS — J101 Influenza due to other identified influenza virus with other respiratory manifestations: Secondary | ICD-10-CM

## 2016-03-26 LAB — RAPID INFLUENZA A&B ANTIGENS (ARMC ONLY)
INFLUENZA A (ARMC): POSITIVE — AB
INFLUENZA B (ARMC): NEGATIVE

## 2016-03-26 MED ORDER — OSELTAMIVIR PHOSPHATE 75 MG PO CAPS: 75.0000 mg | ORAL_CAPSULE | Freq: Two times a day (BID) | ORAL | 0 refills | Status: AC

## 2016-03-26 NOTE — ED Triage Notes (Signed)
Pt visited family over holidays, and they were sick with sinus infection. He is experiencing sinus pressure, coughing, headache, ear pain, chest pain from coughing.

## 2016-03-26 NOTE — ED Provider Notes (Signed)
CSN: [id number]     Arrival date & time 03/26/16  1759 History   First MD Initiated Contact with Patient 03/26/16 1919     Chief Complaint  Patient presents with  . Recurrent Sinusitis   (Consider location/radiation/quality/duration/timing/severity/associated sxs/prior Treatment) Patient is a 35 old male, with several medical histories (see below), presents today with concern for sinus infection. Patient went to visit family members in [place] last week during the holidays, and report that everyone was sick. Patient reports that the local schools in the area had to close to 50-60% of absences. Patient reports that his symptoms initially started last Wednesday. Symptoms are fever, chills, fatigue, sinus pressure, coughing, chest congestion, nasal congestion, running nose, and sore throat. Patient has tried over-the-counter Mucinex, Tylenol, ibuprofen, and cough suppressant with no improvement. Wife is also being seen today for similar symptoms and has a positive influenza A.       Past Medical History:  Diagnosis Date  . Arthritis    Spine, Left Hip, Hands  . 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[place])   . GERD (gastroesophageal reflux disease)   . Heart murmur   . Hypercholesteremia   . Hypothyroidism    graves  . Lung disorder    S/P chemo for Wegener's  . Neuromuscular disorder ([place])    some leg weakness S/P Chemo for Wegener's  . Osteopenia   . Osteoporosis    Femur  . Seasonal allergies   . 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 CHOLECYSTECTOMY     has "metal sutures"   . COLONOSCOPY N/A 10/25/2014   Procedure: COLONOSCOPY;  Surgeon: Lucilla Lame, MD;  Location: [facility];  Service: Gastroenterology;  Laterality: N/A;  . TREATMENT FISTULA ANAL     closure with rectal advancement flap   Family History  Problem Relation Age of Onset  . Family history unknown: Yes   Social History  Substance Use Topics  . Smoking status: Former  Smoker    Quit date: 08/24/1996  . Smokeless tobacco: Never Used  . Alcohol use 1.8 oz/week    3 Cans of beer per week    Review of Systems  Constitutional: Positive for appetite change, chills, fatigue and fever.  HENT: Positive for congestion, rhinorrhea, sinus pain and sinus pressure. Negative for ear pain and sneezing.   Respiratory: Positive for cough. Negative for shortness of breath and wheezing.   Cardiovascular: Negative for chest pain and palpitations.  Gastrointestinal: Negative for abdominal pain, nausea and vomiting.  Neurological: Negative for dizziness and headaches.    Allergies  Sulfa antibiotics; Sulfamethoxazole-trimethoprim; Penicillins; Codeine; Prilosec [omeprazole]; Tetracycline; and Tetracyclines & related  Home Medications   Prior to Admission medications   Medication Sig Start Date End Date Taking? Authorizing Provider  acetaminophen (TYLENOL) 650 MG CR tablet Take 350 mg by mouth every 8 (eight) hours as needed for pain or fever.    Yes Historical Provider, MD  alendronate (FOSAMAX) 70 MG tablet Take 1 tablet by mouth once every week 10/31/15  Yes Arlis Porta., MD  aspirin 81 MG tablet Take 81 mg by mouth daily. AM   Yes Historical Provider, MD  B Complex-C-Folic Acid (B-COMPLEX BALANCED PO) Take 1 tablet by mouth daily.   Yes Historical Provider, MD  calcium carbonate (OS-CAL) 600 MG TABS tablet Take 600 mg by mouth daily. AM   Yes Historical Provider, MD  folic acid (FOLVITE) 401 MCG tablet Take 400 mcg by mouth daily. AM  Yes Historical Provider, MD  levothyroxine (SYNTHROID, LEVOTHROID) 25 MCG tablet Take 1 tablet by mouth  daily 07/28/15  Yes Arlis Porta., MD  loratadine (CLARITIN) 10 MG tablet Take 10 mg by mouth daily as needed for allergies.   Yes Historical Provider, MD  magnesium oxide (MAG-OX) 400 MG tablet Take 400 mg by mouth daily. 07/27/15  Yes Historical Provider, MD  meloxicam (MOBIC) 15 MG tablet Take 1 tablet by mouth  daily 06/13/15   Yes Arlis Porta., MD  methotrexate (RHEUMATREX) 2.5 MG tablet Take 15 mg by mouth once a week. Sat or Sun PM   Yes Historical Provider, MD  niacin (NIASPAN) 500 MG CR tablet Take 750 mg by mouth at bedtime.   Yes Historical Provider, MD  predniSONE (DELTASONE) 5 MG tablet Take 5 mg by mouth daily.   Yes Historical Provider, MD  ranitidine (ZANTAC) 150 MG capsule Take 1 capsule by mouth  daily as needed for  heartburn 12/13/15  Yes Arlis Porta., MD  Vitamin D, Cholecalciferol, 400 UNITS CAPS Take 400 Units by mouth daily.   Yes Historical Provider, MD   Meds Ordered and Administered this Visit  Medications - No data to display  BP (!) 142/74 (BP Location: Left Arm)   Pulse 76   Temp 98.6 F (37 C) (Oral)   Resp 18   Ht 5' 8"  (1.727 m)   Wt 180 lb (81.6 kg)   SpO2 100%   BMI 27.37 kg/m  No data found.   Physical Exam  Constitutional: He appears well-developed and well-nourished.  No acute distress.   HENT:  Head: Normocephalic and atraumatic.  Right Ear: External ear normal.  Left Ear: External ear normal.  Nose: Nose normal.  Mouth/Throat: Oropharynx is clear and moist. No oropharyngeal exudate.  TM pearly gray with no erythema. Left maxillary sinus tender to percuss  Eyes: Conjunctivae are normal. Pupils are equal, round, and reactive to light.  Neck: Normal range of motion. Neck supple.  Cardiovascular: Normal rate and regular rhythm.   Has a grade 2 systolic murmur most prominent at the aortic area.  Pulmonary/Chest: Effort normal and breath sounds normal. No respiratory distress. He has no wheezes.  Abdominal: Soft. Bowel sounds are normal. He exhibits no distension. There is no tenderness.  Lymphadenopathy:    He has no cervical adenopathy.  Neurological: He is alert.  Skin: Skin is warm and dry.  Nursing note and vitals reviewed.   Urgent Care Course   Clinical Course     Procedures (including critical care time)  Labs Review Labs Reviewed   RAPID INFLUENZA A&B ANTIGENS ([place]) - Abnormal; Notable for the following:       Result Value   Influenza A ([facility]) POSITIVE (*)    All other components within normal limits    Imaging Review No results found.  MDM   1. Influenza A     Influenza A is positive. We will treat with Tamiflu 75 mg twice a day 5 days considering his co morbidities. A lot of rest and a lot hydration. Take Tylenol or Motrin for fever if present. Continue with Mucinex and delsym OTC. Please do nasal sinus irrigation. If no improvement is noted after Tamiflu, then please f/u with PCP for re-evaluation.      Barry Dienes, NP 03/26/16 1946

## 2016-04-16 ENCOUNTER — Other Ambulatory Visit: Payer: Self-pay | Admitting: *Deleted

## 2016-04-16 ENCOUNTER — Encounter: Payer: Self-pay | Admitting: *Deleted

## 2016-04-16 ENCOUNTER — Ambulatory Visit (INDEPENDENT_AMBULATORY_CARE_PROVIDER_SITE_OTHER): Payer: Managed Care, Other (non HMO) | Admitting: Family Medicine

## 2016-04-16 VITALS — BP 139/78 | HR 72 | Temp 98.1°F | Resp 16 | Ht 68.0 in | Wt 184.0 lb

## 2016-04-16 DIAGNOSIS — M1990 Unspecified osteoarthritis, unspecified site: Secondary | ICD-10-CM

## 2016-04-16 DIAGNOSIS — M159 Polyosteoarthritis, unspecified: Secondary | ICD-10-CM | POA: Insufficient documentation

## 2016-04-16 DIAGNOSIS — M3130 Wegener's granulomatosis without renal involvement: Secondary | ICD-10-CM

## 2016-04-16 DIAGNOSIS — E079 Disorder of thyroid, unspecified: Secondary | ICD-10-CM | POA: Diagnosis not present

## 2016-04-16 DIAGNOSIS — E785 Hyperlipidemia, unspecified: Secondary | ICD-10-CM

## 2016-04-16 MED ORDER — TRAMADOL HCL 50 MG PO TABS: 50.0000 mg | ORAL_TABLET | Freq: Four times a day (QID) | ORAL | 5 refills | Status: DC | PRN

## 2016-04-16 NOTE — Progress Notes (Signed)
Here for f/u fo Name: Adrian Zavala   MRN: [id number]    DOB: 10-11-1950   Date:04/16/2016       Progress Note  Subjective  Chief Complaint  Chief Complaint  Patient presents with  . Hypothyroidism  . Hyperlipidemia    HPI Here for f/u of Hypothyroidism and elevated lipids. He has Wegeners granulomatosis. That is doing well per Dr. Jefm Bryant.  HE feels well overall.  Getting over the flu.  No problem-specific Assessment & Plan notes found for this encounter.   Past Medical History:  Diagnosis Date  . Arthritis    Spine, Left Hip, Hands  . 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[place])   . GERD (gastroesophageal reflux disease)   . Heart murmur   . Hypercholesteremia   . Hypothyroidism    graves  . Lung disorder    S/P chemo for Wegener's  . Neuromuscular disorder ([place])    some leg weakness S/P Chemo for Wegener's  . Osteopenia   . Osteoporosis    Femur  . Seasonal allergies   . 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[place])     Past Surgical History:  Procedure Laterality Date  . CHOLECYSTECTOMY     has "metal sutures"   . COLONOSCOPY N/A 10/25/2014   Procedure: COLONOSCOPY;  Surgeon: Lucilla Lame, MD;  Location: [facility];  Service: Gastroenterology;  Laterality: N/A;  . TREATMENT FISTULA ANAL     closure with rectal advancement flap    Family History  Problem Relation Age of Onset  . Family history unknown: Yes    Social History   Social History  . Marital status: Married    Spouse name: N/A  . Number of children: N/A  . Years of education: N/A   Occupational History  . Not on file.   Social History Main Topics  . Smoking status: Former Smoker    Quit date: 08/24/1996  . Smokeless tobacco: Never Used  . Alcohol use 1.8 oz/week    3 Cans of beer per week  . Drug use: No  . Sexual activity: Not on file   Other Topics Concern  . Not on file   Social History Narrative  . No narrative on file     Current Outpatient Prescriptions:  .  acetaminophen (TYLENOL) 650 MG CR  tablet, Take 350 mg by mouth every 8 (eight) hours as needed for pain or fever. , Disp: , Rfl:  .  alendronate (FOSAMAX) 70 MG tablet, Take 1 tablet by mouth once every week, Disp: 12 tablet, Rfl: 3 .  aspirin 81 MG tablet, Take 81 mg by mouth daily. AM, Disp: , Rfl:  .  B Complex-C-Folic Acid (B-COMPLEX BALANCED PO), Take 1 tablet by mouth daily., Disp: , Rfl:  .  calcium carbonate (OS-CAL) 600 MG TABS tablet, Take 600 mg by mouth daily. AM, Disp: , Rfl:  .  folic acid (FOLVITE) 494 MCG tablet, Take 400 mcg by mouth daily. AM, Disp: , Rfl:  .  levothyroxine (SYNTHROID, LEVOTHROID) 25 MCG tablet, Take 1 tablet by mouth  daily, Disp: 90 tablet, Rfl: 3 .  loratadine (CLARITIN) 10 MG tablet, Take 10 mg by mouth daily as needed for allergies., Disp: , Rfl:  .  magnesium oxide (MAG-OX) 400 MG tablet, Take 400 mg by mouth daily., Disp: , Rfl:  .  meloxicam (MOBIC) 15 MG tablet, Take 1 tablet by mouth  daily, Disp: 90 tablet, Rfl: 3 .  methotrexate (RHEUMATREX) 2.5 MG tablet, Take 15 mg by mouth once a week. Sat or  Sun PM, Disp: , Rfl:  .  niacin (NIASPAN) 500 MG CR tablet, Take 750 mg by mouth at bedtime., Disp: , Rfl:  .  predniSONE (DELTASONE) 5 MG tablet, Take 5 mg by mouth daily., Disp: , Rfl:  .  ranitidine (ZANTAC) 150 MG capsule, Take 1 capsule by mouth  daily as needed for  heartburn, Disp: 90 capsule, Rfl: 3 .  Vitamin D, Cholecalciferol, 400 UNITS CAPS, Take 400 Units by mouth daily., Disp: , Rfl:  .  traMADol (ULTRAM) 50 MG tablet, Take 1 tablet (50 mg total) by mouth every 6 (six) hours as needed for moderate pain., Disp: 120 tablet, Rfl: 5  Allergies  Allergen Reactions  . Sulfa Antibiotics Anaphylaxis  . Sulfamethoxazole-Trimethoprim Anaphylaxis  . Penicillins Other (See Comments)    Passes out  . Codeine Itching, Other (See Comments) and Rash    tingling Rash and dizziness  . Prilosec [Omeprazole] Hives and Rash  . Tetracycline Rash  . Tetracyclines & Related Rash     Review  of Systems  Constitutional: Negative for chills, fever, malaise/fatigue and weight loss.  HENT: Negative for hearing loss and tinnitus.   Eyes: Negative for blurred vision and double vision.  Respiratory: Negative for cough, shortness of breath and wheezing.   Cardiovascular: Negative for chest pain, palpitations and leg swelling.  Gastrointestinal: Negative for abdominal pain, blood in stool and heartburn.  Genitourinary: Negative for dysuria, frequency and urgency.  Musculoskeletal: Positive for joint pain. Negative for myalgias.  Skin: Negative for rash.  Neurological: Negative for dizziness, tingling, tremors, weakness and headaches.      Objective  Vitals:   04/16/16 1045  BP: 139/78  Pulse: 72  Resp: 16  Temp: 98.1 F (36.7 C)  TempSrc: Oral  Weight: 184 lb (83.5 kg)  Height: 5' 8"  (1.727 m)    Physical Exam  Constitutional: He is oriented to person, place, and time and well-developed, well-nourished, and in no distress. No distress.  HENT:  Head: Normocephalic and atraumatic.  Eyes: Conjunctivae and EOM are normal. Pupils are equal, round, and reactive to light. No scleral icterus.  Neck: Normal range of motion. Neck supple. No thyromegaly present.  Cardiovascular: Normal rate, regular rhythm and normal heart sounds.  Exam reveals no gallop and no friction rub.   No murmur heard. Pulmonary/Chest: Effort normal and breath sounds normal. No respiratory distress. He has no wheezes. He has no rales.  Abdominal: Soft. Bowel sounds are normal. He exhibits no distension and no mass. There is no tenderness.  Musculoskeletal: He exhibits no edema.  Lymphadenopathy:    He has no cervical adenopathy.  Neurological: He is alert and oriented to person, place, and time.  Vitals reviewed.      Recent Results (from the past 2160 hour(s))  Rapid Influenza A&B Antigens ([place] only)     Status: Abnormal   Collection Time: 03/26/16  7:09 PM  Result Value Ref Range   Influenza A  ([facility]) POSITIVE (A) NEGATIVE   Influenza B ([facility]) NEGATIVE NEGATIVE     Assessment & Plan  Problem List Items Addressed This Visit      Cardiovascular and Mediastinum   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[place])     Endocrine   Disease of thyroid gland - Primary   Relevant Orders   TSH     Musculoskeletal and Integument   Arthritis   Relevant Medications   traMADol (ULTRAM) 50 MG tablet     Other   HLD (hyperlipidemia)   Relevant Orders  Lipid Profile   COMPLETE METABOLIC PANEL WITH GFR      Meds ordered this encounter  Medications  . traMADol (ULTRAM) 50 MG tablet    Sig: Take 1 tablet (50 mg total) by mouth every 6 (six) hours as needed for moderate pain.    Dispense:  120 tablet    Refill:  5   1. Disease of thyroid gland Cont Levothyroxine - TSH  2. Hyperlipidemia, unspecified hyperlipidemia type Cont to watch diet. - Lipid Profile - COMPLETE METABOLIC PANEL WITH GFR  3. 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[place]) Cont meds from Dr. Jefm Bryant  4. Arthritis Use Mobic only rarely Rx for Tramadol 50 mg up to qid.

## 2016-04-16 NOTE — Addendum Note (Signed)
Addended by: Devona Konig on: 04/16/2016 12:13 PM   Modules accepted: Orders

## 2016-04-18 LAB — COMPREHENSIVE METABOLIC PANEL
ALBUMIN: 4.5 g/dL (ref 3.6–4.8)
ALK PHOS: 72 IU/L (ref 39–117)
ALT: 20 IU/L (ref 0–44)
AST: 18 IU/L (ref 0–40)
Albumin/Globulin Ratio: 2 (ref 1.2–2.2)
BILIRUBIN TOTAL: 0.5 mg/dL (ref 0.0–1.2)
BUN / CREAT RATIO: 25 — AB (ref 10–24)
BUN: 25 mg/dL (ref 8–27)
CHLORIDE: 102 mmol/L (ref 96–106)
CO2: 25 mmol/L (ref 18–29)
Calcium: 9.8 mg/dL (ref 8.6–10.2)
Creatinine, Ser: 1.02 mg/dL (ref 0.76–1.27)
GFR calc Af Amer: 89 mL/min/{1.73_m2} (ref >59)
GFR calc non Af Amer: 77 mL/min/{1.73_m2} (ref >59)
GLOBULIN, TOTAL: 2.3 g/dL (ref 1.5–4.5)
GLUCOSE: 86 mg/dL (ref 65–99)
Potassium: 4.7 mmol/L (ref 3.5–5.2)
SODIUM: 142 mmol/L (ref 134–144)
Total Protein: 6.8 g/dL (ref 6.0–8.5)

## 2016-04-18 LAB — LIPID PANEL
CHOL/HDL RATIO: 3.5 ratio (ref 0.0–5.0)
CHOLESTEROL TOTAL: 224 mg/dL — AB (ref 100–199)
HDL: 64 mg/dL (ref >39)
LDL CALC: 125 mg/dL — AB (ref 0–99)
TRIGLYCERIDES: 173 mg/dL — AB (ref 0–149)
VLDL CHOLESTEROL CAL: 35 mg/dL (ref 5–40)

## 2016-04-18 LAB — TSH: TSH: 3.24 u[IU]/mL (ref 0.450–4.500)

## 2016-04-19 ENCOUNTER — Other Ambulatory Visit: Payer: Self-pay | Admitting: Family Medicine

## 2016-04-23 ENCOUNTER — Encounter: Payer: Self-pay | Admitting: Cardiovascular Disease

## 2016-04-23 ENCOUNTER — Ambulatory Visit (INDEPENDENT_AMBULATORY_CARE_PROVIDER_SITE_OTHER): Payer: Managed Care, Other (non HMO) | Admitting: Cardiovascular Disease

## 2016-04-23 VITALS — BP 148/76 | HR 68

## 2016-04-23 DIAGNOSIS — M3130 Wegener's granulomatosis without renal involvement: Secondary | ICD-10-CM

## 2016-04-23 DIAGNOSIS — I10 Essential (primary) hypertension: Secondary | ICD-10-CM

## 2016-04-23 DIAGNOSIS — Z8249 Family history of ischemic heart disease and other diseases of the circulatory system: Secondary | ICD-10-CM

## 2016-04-23 DIAGNOSIS — R011 Cardiac murmur, unspecified: Secondary | ICD-10-CM

## 2016-04-23 DIAGNOSIS — E785 Hyperlipidemia, unspecified: Secondary | ICD-10-CM | POA: Diagnosis not present

## 2016-04-23 NOTE — Progress Notes (Signed)
Cardiology Office Note  Date:  04/23/2016   ID:  Adrian Zavala, DOB 03-Jun-1950, MRN [id number]  PCP:  Dicky Doe, MD   Chief Complaint  Patient presents with  . other    12 month follow up overall doing well.  Meds reviewed verbally with patient.    HPI:  Adrian Zavala is a very pleasant 66 year old gentleman with a history of Wegener's, s/p chemo, Cushing's, long history of smoking who stopped in 1998, hyperlipidemia on a statin, on chronic prednisone, presenting for follow-up of his heart murmur, history of coronary artery disease, hyperlipidemia.  Patient of Dr. Luan Pulling  In follow-up today, he is currently not taking his cholesterol medication Reports that the Lipitor cause myalgias around his abdominal area  murmur diagnosed in his 8s Denies any significant symptoms. Very active at baseline, no significant shortness of breath on exertion, no chest discomfort on exertion   Lab work reviewed with him total chol 224, up from 150 without the Lipitor   Reports that he had the flu over Christmas 2017  Previous echocardiogram reviewed with him Echo 03/2015 - Left ventricle: The cavity size was normal. Wall thickness was   normal. Systolic function was normal. The estimated ejection   fraction was in the range of 55% to 60%. Wall motion was normal;   there were no regional wall motion abnormalities. Left   ventricular diastolic function parameters were normal. - Aortic valve: Possibly bicuspid; mildly thickened, mildly   calcified leaflets. Valve area (VTI): 2.15 cm^2. - Mitral valve: There was mild regurgitation.  EKG on today's visit shows normal sinus rhythm with rate 68 bpm, no significant ST or T-wave changes  Other past medical history reviewed  Wegener's, long history of chemotherapy, on chronic methotrexate once per week, on chronic prednisone  Reports he's been on cholesterol medication since the 1980s, when it first came out  Prior history and the [employer], Previous issues with gallbladder disease in his 67s  episode of nausea vomiting in November 2016    PMH:   has a past medical history of Arthritis; 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[place]); GERD (gastroesophageal reflux disease); Heart murmur; Hypercholesteremia; Hypothyroidism; Lung disorder; Neuromuscular disorder ([place]); Osteopenia; Osteoporosis; Seasonal allergies; and 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[place]).  PSH:    Past Surgical History:  Procedure Laterality Date  . CHOLECYSTECTOMY     has "metal sutures"   . COLONOSCOPY N/A 10/25/2014   Procedure: COLONOSCOPY;  Surgeon: Lucilla Lame, MD;  Location: [facility];  Service: Gastroenterology;  Laterality: N/A;  . TREATMENT FISTULA ANAL     closure with rectal advancement flap    Current Outpatient Prescriptions  Medication Sig Dispense Refill  . acetaminophen (TYLENOL) 650 MG CR tablet Take 350 mg by mouth every 8 (eight) hours as needed for pain or fever.     Marland Kitchen alendronate (FOSAMAX) 70 MG tablet Take 1 tablet by mouth once every week 12 tablet 3  . aspirin 81 MG tablet Take 81 mg by mouth daily. AM    . B Complex-C-Folic Acid (B-COMPLEX BALANCED PO) Take 1 tablet by mouth daily.    . calcium carbonate (OS-CAL) 600 MG TABS tablet Take 600 mg by mouth daily. AM    . folic acid (FOLVITE) 008 MCG tablet Take 400 mcg by mouth daily. AM    . levothyroxine (SYNTHROID, LEVOTHROID) 25 MCG tablet TAKE 1 TABLET BY MOUTH  DAILY 90 tablet 3  . loratadine (CLARITIN) 10 MG tablet Take 10 mg by mouth daily as needed for allergies.    Marland Kitchen  magnesium oxide (MAG-OX) 400 MG tablet Take 400 mg by mouth daily.    . meloxicam (MOBIC) 15 MG tablet Take 1 tablet by mouth  daily 90 tablet 3  . methotrexate (RHEUMATREX) 2.5 MG tablet Take 15 mg by mouth once a week. Sat or Sun PM    . niacin (NIASPAN) 500 MG CR tablet Take 750 mg by mouth at bedtime.    . predniSONE (DELTASONE) 5 MG tablet Take 5 mg by mouth daily.    . ranitidine (ZANTAC) 150 MG capsule Take 1 capsule  by mouth  daily as needed for  heartburn 90 capsule 3  . traMADol (ULTRAM) 50 MG tablet Take 1 tablet (50 mg total) by mouth every 6 (six) hours as needed for moderate pain. 120 tablet 5  . Vitamin D, Cholecalciferol, 400 UNITS CAPS Take 400 Units by mouth daily.     No current facility-administered medications for this visit.      Allergies:   Sulfa antibiotics; Sulfamethoxazole-trimethoprim; Penicillins; Codeine; Prilosec [omeprazole]; Tetracycline; and Tetracyclines & related   Social History:  The patient  reports that he quit smoking about 19 years ago. He has never used smokeless tobacco. He reports that he drinks about 1.8 oz of alcohol per week . He reports that he does not use drugs.   Family History:  Discussed his family history, mother with coronary artery disease   Review of Systems: Review of Systems  Constitutional: Negative.   Respiratory: Negative.   Cardiovascular: Negative.   Gastrointestinal: Negative.   Musculoskeletal: Negative.   Neurological: Negative.   Psychiatric/Behavioral: Negative.   All other systems reviewed and are negative.    PHYSICAL EXAM: VS:  BP (!) 148/76 (BP Location: Left Arm, Patient Position: Sitting, Cuff Size: Normal)   Pulse 68  , BMI There is no height or weight on file to calculate BMI. GEN: Well nourished, well developed, in no acute distress  HEENT: normal  Neck: no JVD, carotid bruits, or masses Cardiac: RRR; I/VI SEM RSB,  no rubs, or gallops,no edema  Respiratory:  clear to auscultation bilaterally, normal work of breathing GI: soft, nontender, nondistended, + BS MS: no deformity or atrophy  Skin: warm and dry, no rash Neuro:  Strength and sensation are intact Psych: euthymic mood, full affect    Recent Labs: 08/29/2015: Platelets 221 04/17/2016: ALT 20; BUN 25; Creatinine, Ser 1.02; Potassium 4.7; Sodium 142; TSH 3.240    Lipid Panel Lab Results  Component Value Date   CHOL 224 (H) 04/17/2016   HDL 64 04/17/2016    LDLCALC 125 (H) 04/17/2016   TRIG 173 (H) 04/17/2016      Wt Readings from Last 3 Encounters:  04/16/16 184 lb (83.5 kg)  03/26/16 180 lb (81.6 kg)  12/12/15 178 lb (80.7 kg)       ASSESSMENT AND PLAN:  Cardiac murmur - Plan: EKG 12-Lead, CT CARDIAC SCORING Long discussion concerning bicuspid aortic valve Valve was not well visualized on echocardiogram in 2017 No significant change in murmur on today's visit We'll continue to monitor by exam  Family history of premature CAD - Plan: CT CARDIAC SCORING Mother had severe coronary disease Will order screening test  Hyperlipidemia, unspecified hyperlipidemia type - Plan: CT CARDIAC SCORING Strong family history of coronary artery disease Discussed various options for him now that he is off his cholesterol medication, elevated total cholesterol. 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 Recommended he monitor blood pressure at home   Total encounter time more than 25 minutes  Greater than 50% was spent in counseling and coordination of care with the patient   Disposition:   F/U  12 months   Orders Placed This Encounter  Procedures  . CT CARDIAC SCORING  . EKG 12-Lead     Signed, Esmond Plants, M.D., Ph.D. 04/23/2016  [place], [place]

## 2016-04-23 NOTE — Patient Instructions (Addendum)
Medication Instructions:   No medication changes made  Labwork:  No new labs needed  Testing/Procedures:  We will schedule a CT coronary calcium score for family hx of CAD, hyperlipidemia Monday, Feb 5 @ 11:30 - please arrive @ 11:15 There is a one-time fee of $150 due at the time of your procedure [address] [place], [place]  I recommend watching educational videos on topics of interest to you at:       [email or web address]  Enter code: HEARTCARE    Follow-Up: It was a pleasure seeing you in the office today. Please call us if you have new issues that need to be addressed before your next appt.  [phone]  Your physician wants you to follow-up in: 12 months.  You will receive a reminder letter in the mail two months in advance. If you don't receive a letter, please call our office to schedule the follow-up appointment.  If you need a refill on your cardiac medications before your next appointment, please call your pharmacy.    Coronary Calcium Scan A coronary calcium scan is an imaging test used to look for deposits of calcium and other fatty materials (plaques) in the inner lining of the blood vessels of your heart (coronary arteries). These deposits of calcium and plaques can partly clog and narrow the coronary arteries without producing any symptoms or warning signs. This puts you at risk for a heart attack. This test can detect these deposits before symptoms develop.  LET [facility] CARE PROVIDER KNOW ABOUT:  Any allergies you have.  All medicines you are taking, including vitamins, herbs, eye drops, creams, and over-the-counter medicines.  Previous problems you or members of your family have had with the use of anesthetics.  Any blood disorders you have.  Previous surgeries you have had.  Medical conditions you have.  Possibility of pregnancy, if this applies. RISKS AND COMPLICATIONS Generally, this is a safe procedure. However, as  with any procedure, complications can occur. This test involves the use of radiation. Radiation exposure can be dangerous to a pregnant woman and her unborn baby. If you are pregnant, you should not have this procedure done.  BEFORE THE PROCEDURE There is no special preparation for the procedure. PROCEDURE  You will need to undress and put on a hospital gown. You will need to remove any jewelry around your neck or chest.  Sticky electrodes are placed on your chest and are connected to an electrocardiogram (EKG or electrocardiography) machine to recorda tracing of the electrical activity of your heart.  A CT scanner will take pictures of your heart. During this time, you will be asked to lie still and hold your breath for 2-3 seconds while a picture is being taken of your heart. AFTER THE PROCEDURE   You will be allowed to get dressed.  You can return to your normal activities after the scan is done. This information is not intended to replace advice given to you by your health care provider. Make sure you discuss any questions you have with your health care provider. Document Released: 09/08/2007 Document Revised: 03/17/2013 Document Reviewed: 11/17/2012 Elsevier Interactive Patient Education  2017 [employer].

## 2016-04-30 ENCOUNTER — Inpatient Hospital Stay: Admission: RE | Admit: 2016-04-30 | Payer: Managed Care, Other (non HMO) | Source: Ambulatory Visit

## 2016-05-07 ENCOUNTER — Ambulatory Visit (INDEPENDENT_AMBULATORY_CARE_PROVIDER_SITE_OTHER)
Admission: RE | Admit: 2016-05-07 | Discharge: 2016-05-07 | Disposition: A | Discharge status: Home / Self Care | Payer: Self-pay | Source: Ambulatory Visit | Attending: Cardiovascular Disease | Admitting: Cardiovascular Disease

## 2016-05-07 DIAGNOSIS — E785 Hyperlipidemia, unspecified: Secondary | ICD-10-CM

## 2016-05-07 DIAGNOSIS — Z8249 Family history of ischemic heart disease and other diseases of the circulatory system: Secondary | ICD-10-CM

## 2016-05-07 DIAGNOSIS — I77810 Thoracic aortic ectasia: Secondary | ICD-10-CM

## 2016-05-07 DIAGNOSIS — R011 Cardiac murmur, unspecified: Secondary | ICD-10-CM

## 2016-05-07 HISTORY — DX: Thoracic aortic ectasia: I77.810

## 2016-05-08 ENCOUNTER — Other Ambulatory Visit: Payer: Self-pay

## 2016-05-08 DIAGNOSIS — R931 Abnormal findings on diagnostic imaging of heart and coronary circulation: Secondary | ICD-10-CM

## 2016-05-08 MED ORDER — ROSUVASTATIN CALCIUM 20 MG PO TABS: 20.0000 mg | ORAL_TABLET | Freq: Every day | ORAL | 3 refills | Status: DC

## 2016-05-14 ENCOUNTER — Ambulatory Visit
Admission: RE | Admit: 2016-05-14 | Discharge: 2016-05-14 | Disposition: A | Discharge status: Home / Self Care | Payer: Managed Care, Other (non HMO) | Source: Ambulatory Visit | Attending: Cardiovascular Disease | Admitting: Cardiovascular Disease

## 2016-05-14 DIAGNOSIS — R931 Abnormal findings on diagnostic imaging of heart and coronary circulation: Secondary | ICD-10-CM | POA: Insufficient documentation

## 2016-05-14 LAB — NM MYOCAR MULTI W/SPECT W/WALL MOTION / EF
CHL CUP STRESS STAGE 1 SPEED: 0 mph
CHL CUP STRESS STAGE 2 HR: 77 {beats}/min
CHL CUP STRESS STAGE 4 SPEED: 2.5 mph
CHL CUP STRESS STAGE 5 HR: 129 {beats}/min
CHL CUP STRESS STAGE 5 SPEED: 0 mph
CHL CUP STRESS STAGE 6 DBP: 47 mmHg
CHL CUP STRESS STAGE 6 SBP: 154 mmHg
CSEPEDS: 40 s
CSEPHR: 90 %
CSEPPBP: 149 mmHg
CSEPPHR: 141 {beats}/min
Estimated workload: 7 METS
Exercise duration (min): 6 min
LV sys vol: 10 mL
LVDIAVOL: 48 mL (ref 62–150)
Percent of predicted max HR: 90 %
SDS: 0
SRS: 0
SSS: 0
Stage 1 Grade: 0 %
Stage 1 HR: 77 {beats}/min
Stage 2 Grade: 0 %
Stage 2 Speed: 0 mph
Stage 3 Grade: 10 %
Stage 3 HR: 114 {beats}/min
Stage 3 Speed: 1.7 mph
Stage 4 DBP: 73 mmHg
Stage 4 Grade: 12 %
Stage 4 HR: 141 {beats}/min
Stage 4 SBP: 149 mmHg
Stage 5 Grade: 0 %
Stage 6 Grade: 0 %
Stage 6 HR: 99 {beats}/min
Stage 6 Speed: 0 mph
TID: 0.92

## 2016-05-14 MED ORDER — TECHNETIUM TC 99M TETROFOSMIN IV KIT
30.0000 | PACK | Freq: Once | INTRAVENOUS | Status: AC | PRN
  Administered 2016-05-14: 32.091 via INTRAVENOUS

## 2016-05-14 MED ORDER — TECHNETIUM TC 99M TETROFOSMIN IV KIT
13.4730 | PACK | Freq: Once | INTRAVENOUS | Status: AC | PRN
  Administered 2016-05-14: 13.473 via INTRAVENOUS

## 2016-05-21 ENCOUNTER — Encounter: Payer: Self-pay | Admitting: Cardiovascular Disease

## 2016-05-21 ENCOUNTER — Ambulatory Visit (INDEPENDENT_AMBULATORY_CARE_PROVIDER_SITE_OTHER): Payer: Managed Care, Other (non HMO) | Admitting: Cardiovascular Disease

## 2016-05-21 VITALS — BP 160/72 | HR 74 | Ht 68.0 in | Wt 180.0 lb

## 2016-05-21 DIAGNOSIS — I712 Thoracic aortic aneurysm, without rupture: Secondary | ICD-10-CM

## 2016-05-21 DIAGNOSIS — I10 Essential (primary) hypertension: Secondary | ICD-10-CM

## 2016-05-21 DIAGNOSIS — I7121 Aneurysm of the ascending aorta, without rupture: Secondary | ICD-10-CM

## 2016-05-21 DIAGNOSIS — Q231 Congenital insufficiency of aortic valve: Secondary | ICD-10-CM | POA: Diagnosis not present

## 2016-05-21 DIAGNOSIS — E782 Mixed hyperlipidemia: Secondary | ICD-10-CM

## 2016-05-21 DIAGNOSIS — I2510 Atherosclerotic heart disease of native coronary artery without angina pectoris: Secondary | ICD-10-CM

## 2016-05-21 MED ORDER — EZETIMIBE 10 MG PO TABS: 10.0000 mg | ORAL_TABLET | Freq: Every day | ORAL | 3 refills | Status: DC

## 2016-05-21 NOTE — Progress Notes (Signed)
Cardiology Office Note  Date:  05/21/2016   ID:  Adrian Zavala, DOB 01-22-1951, MRN [id number]  PCP:  Dicky Doe, MD   Chief Complaint  Patient presents with  . other    F/U from CT calcium score and Myoview. "doing well."    HPI:  Adrian Zavala is a very pleasant 66 year old gentleman with a history of Wegener's, s/p chemo 1991,  Cushing's, long history of smoking who stopped in 1998, hyperlipidemia on a statin, on chronic prednisone, presenting for follow-up of his heart murmur (Previous echocardiogram concerning for bicuspid aortic valve), history of coronary artery disease, hyperlipidemia.  Patient of Dr. Luan Pulling  Off lipitor, He developed myalgias  In follow-up today he reports that he is Active, no sx with exertion  We spent significant time reviewing his recent CT coronary calcium score and stress test CT coronary calcium score 05/07/2016 Score of 497, 75th percentile based on age Also with calcified aortic valve, Dilated ascending aorta 4.1 cm Images pulled up in the office and reviewed with him  Stress test ordered 05/14/2016 performed Showing no ischemia normal ejection fraction  murmur diagnosed in his 20s Previous echocardiogram in 2017 concerning for bicuspid aortic valve  Based on the calcium score findings above we've recommended he start Crestor. He has started 20 mg daily. So far with no myalgias   Other past medical history reviewed  Wegener's, long history of chemotherapy, on chronic methotrexate once per week, on chronic prednisone  Reports he's been on cholesterol medication since the 1980s, when it first came out  Prior history and the [employer], Previous issues with gallbladder disease in his 81s  episode of nausea vomiting in November 2016  PMH:   has a past medical history of Arthritis; 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[place]); GERD (gastroesophageal reflux disease); Heart murmur; Hypercholesteremia; Hypothyroidism; Lung disorder; Neuromuscular disorder  ([place]); Osteopenia; Osteoporosis; Seasonal allergies; and 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  PSH:    Past Surgical History:  Procedure Laterality Date  . CHOLECYSTECTOMY     has "metal sutures"   . COLONOSCOPY N/A 10/25/2014   Procedure: COLONOSCOPY;  Surgeon: Lucilla Lame, MD;  Location: [facility];  Service: Gastroenterology;  Laterality: N/A;  . TREATMENT FISTULA ANAL     closure with rectal advancement flap    Current Outpatient Prescriptions  Medication Sig Dispense Refill  . acetaminophen (TYLENOL) 650 MG CR tablet Take 350 mg by mouth every 8 (eight) hours as needed for pain or fever.     Marland Kitchen alendronate (FOSAMAX) 70 MG tablet Take 1 tablet by mouth once every week 12 tablet 3  . aspirin 81 MG tablet Take 81 mg by mouth daily. AM    . B Complex-C-Folic Acid (B-COMPLEX BALANCED PO) Take 1 tablet by mouth daily.    . calcium carbonate (OS-CAL) 600 MG TABS tablet Take 600 mg by mouth daily. AM    . folic acid (FOLVITE) 741 MCG tablet Take 400 mcg by mouth daily. AM    . levothyroxine (SYNTHROID, LEVOTHROID) 25 MCG tablet TAKE 1 TABLET BY MOUTH  DAILY 90 tablet 3  . loratadine (CLARITIN) 10 MG tablet Take 10 mg by mouth daily as needed for allergies.    . magnesium oxide (MAG-OX) 400 MG tablet Take 400 mg by mouth daily.    . meloxicam (MOBIC) 15 MG tablet Take 1 tablet by mouth  daily 90 tablet 3  . methotrexate (RHEUMATREX) 2.5 MG tablet Take 15 mg by mouth once a week. Sat or Sun PM    .  niacin (NIASPAN) 500 MG CR tablet Take 750 mg by mouth at bedtime.    . predniSONE (DELTASONE) 5 MG tablet Take 5 mg by mouth daily.    . ranitidine (ZANTAC) 150 MG capsule Take 1 capsule by mouth  daily as needed for  heartburn 90 capsule 3  . rosuvastatin (CRESTOR) 20 MG tablet Take 1 tablet (20 mg total) by mouth daily. 90 tablet 3  . traMADol (ULTRAM) 50 MG tablet Take 1 tablet (50 mg total) by mouth every 6 (six) hours as needed for moderate pain. 120 tablet 5  . Vitamin D, Cholecalciferol,  400 UNITS CAPS Take 400 Units by mouth daily.    Marland Kitchen ezetimibe (ZETIA) 10 MG tablet Take 1 tablet (10 mg total) by mouth daily. 90 tablet 3   No current facility-administered medications for this visit.      Allergies:   Sulfa antibiotics; Sulfamethoxazole-trimethoprim; Penicillins; Codeine; Prilosec [omeprazole]; Tetracycline; and Tetracyclines & related   Social History:  The patient  reports that he quit smoking about 19 years ago. He has never used smokeless tobacco. He reports that he drinks about 1.8 oz of alcohol per week . He reports that he does not use drugs.   Family History:   Family history is unknown by patient.    Review of Systems: Review of Systems  Constitutional: Negative.   Respiratory: Negative.   Cardiovascular: Negative.   Gastrointestinal: Negative.   Musculoskeletal: Negative.   Neurological: Negative.   Psychiatric/Behavioral: Negative.   All other systems reviewed and are negative.    PHYSICAL EXAM: VS:  BP (!) 160/72 (BP Location: Left Arm, Patient Position: Sitting, Cuff Size: Normal)   Pulse 74   Ht 5' 8"  (1.727 m)   Wt 180 lb (81.6 kg)   BMI 27.37 kg/m  , BMI Body mass index is 27.37 kg/m. GEN: Well nourished, well developed, in no acute distress  HEENT: normal  Neck: no JVD, carotid bruits, or masses Cardiac: RRR; no murmurs, rubs, or gallops,no edema  Respiratory:  clear to auscultation bilaterally, normal work of breathing GI: soft, nontender, nondistended, + BS MS: no deformity or atrophy  Skin: warm and dry, no rash Neuro:  Strength and sensation are intact Psych: euthymic mood, full affect    Recent Labs: 08/29/2015: Platelets 221 04/17/2016: ALT 20; BUN 25; Creatinine, Ser 1.02; Potassium 4.7; Sodium 142; TSH 3.240    Lipid Panel Lab Results  Component Value Date   CHOL 224 (H) 04/17/2016   HDL 64 04/17/2016   LDLCALC 125 (H) 04/17/2016   TRIG 173 (H) 04/17/2016      Wt Readings from Last 3 Encounters:  05/21/16 180 lb  (81.6 kg)  04/16/16 184 lb (83.5 kg)  03/26/16 180 lb (81.6 kg)       ASSESSMENT AND PLAN:  Mixed hyperlipidemia Stressed importance of aggressive cholesterol control Recommended he continue Crestor and start zetia, ideally goal LDL less than 70, preferably 60  Essential hypertension Blood pressure is well controlled on today's visit. No changes made to the medications.  Bicuspid aortic valve High suspicion of bicuspid aortic valve given long-standing murmur, calcified aortic valve, appearance of bicuspid aortic valve on echocardiogram though not well visualized. At later date could perform transesophageal echo if needed if valve develops stenosis or ascending aorta becomes increasingly dilated  Aneurysm of ascending aorta (HCC) Images reviewed with him, 4.1 cm Recommended periodic echocardiography or CT scanning to evaluate size of ascending aorta. Long discussion concerning treatment options if size of aorta  dilates  Coronary artery disease involving native coronary artery of native heart without angina pectoris significant coronary calcification noted in the LAD and RCA, score close to 500 Recent stress test showing no ischemia Results discussed with him  Long discussion concerning CT scan above and stress testing  Total encounter time more than 45 minutes  Greater than 50% was spent in counseling and coordination of care with the patient   Disposition:   F/U  12 months  No orders of the defined types were placed in this encounter.    Signed, Esmond Plants, M.D., Ph.D. 05/21/2016  [place], [place]

## 2016-05-21 NOTE — Patient Instructions (Addendum)
Medication Instructions:   Please start zetia one a day with crestor daily  Goal LDL <70, preferably 60 or less  Labwork:  No new labs needed  Testing/Procedures:  No further testing at this time   I recommend watching educational videos on topics of interest to you at:       [email or web address]  Enter code: [email or web address]    Follow-Up: It was a pleasure seeing you in the office today. Please call us if you have new issues that need to be addressed before your next appt.  [phone]  Your physician wants you to follow-up in: 12 months.  You will receive a reminder letter in the mail two months in advance. If you don't receive a letter, please call our office to schedule the follow-up appointment.  If you need a refill on your cardiac medications before your next appointment, please call your pharmacy.

## 2016-08-15 ENCOUNTER — Other Ambulatory Visit: Payer: Self-pay

## 2016-08-15 DIAGNOSIS — M791 Myalgia, unspecified site: Secondary | ICD-10-CM

## 2016-08-15 MED ORDER — MELOXICAM 15 MG PO TABS: 15.0000 mg | ORAL_TABLET | Freq: Every day | ORAL | 0 refills | Status: DC

## 2016-10-01 ENCOUNTER — Ambulatory Visit (INDEPENDENT_AMBULATORY_CARE_PROVIDER_SITE_OTHER): Payer: Managed Care, Other (non HMO) | Admitting: Family Medicine

## 2016-10-01 ENCOUNTER — Encounter: Payer: Self-pay | Admitting: Family Medicine

## 2016-10-01 VITALS — BP 138/60 | HR 67 | Temp 97.9°F | Resp 16 | Ht 68.0 in | Wt 182.0 lb

## 2016-10-01 DIAGNOSIS — Z125 Encounter for screening for malignant neoplasm of prostate: Secondary | ICD-10-CM | POA: Diagnosis not present

## 2016-10-01 DIAGNOSIS — M3130 Wegener's granulomatosis without renal involvement: Secondary | ICD-10-CM

## 2016-10-01 DIAGNOSIS — Z114 Encounter for screening for human immunodeficiency virus [HIV]: Secondary | ICD-10-CM

## 2016-10-01 DIAGNOSIS — I10 Essential (primary) hypertension: Secondary | ICD-10-CM

## 2016-10-01 DIAGNOSIS — Z23 Encounter for immunization: Secondary | ICD-10-CM

## 2016-10-01 DIAGNOSIS — Z1159 Encounter for screening for other viral diseases: Secondary | ICD-10-CM | POA: Diagnosis not present

## 2016-10-01 DIAGNOSIS — M1990 Unspecified osteoarthritis, unspecified site: Secondary | ICD-10-CM | POA: Diagnosis not present

## 2016-10-01 DIAGNOSIS — K589 Irritable bowel syndrome without diarrhea: Secondary | ICD-10-CM | POA: Insufficient documentation

## 2016-10-01 DIAGNOSIS — M818 Other osteoporosis without current pathological fracture: Secondary | ICD-10-CM

## 2016-10-01 DIAGNOSIS — E039 Hypothyroidism, unspecified: Secondary | ICD-10-CM

## 2016-10-01 DIAGNOSIS — E559 Vitamin D deficiency, unspecified: Secondary | ICD-10-CM

## 2016-10-01 DIAGNOSIS — I7121 Aneurysm of the ascending aorta, without rupture: Secondary | ICD-10-CM

## 2016-10-01 DIAGNOSIS — M159 Polyosteoarthritis, unspecified: Secondary | ICD-10-CM

## 2016-10-01 DIAGNOSIS — Q231 Congenital insufficiency of aortic valve: Secondary | ICD-10-CM

## 2016-10-01 DIAGNOSIS — Z0000 Encounter for general adult medical examination without abnormal findings: Secondary | ICD-10-CM | POA: Diagnosis not present

## 2016-10-01 DIAGNOSIS — Q2381 Bicuspid aortic valve: Secondary | ICD-10-CM

## 2016-10-01 DIAGNOSIS — R7309 Other abnormal glucose: Secondary | ICD-10-CM | POA: Diagnosis not present

## 2016-10-01 DIAGNOSIS — E782 Mixed hyperlipidemia: Secondary | ICD-10-CM

## 2016-10-01 DIAGNOSIS — M810 Age-related osteoporosis without current pathological fracture: Secondary | ICD-10-CM | POA: Insufficient documentation

## 2016-10-01 DIAGNOSIS — I712 Thoracic aortic aneurysm, without rupture: Secondary | ICD-10-CM

## 2016-10-01 DIAGNOSIS — M150 Primary generalized (osteo)arthritis: Secondary | ICD-10-CM

## 2016-10-01 MED ORDER — TRAMADOL HCL 50 MG PO TABS: 50.0000 mg | ORAL_TABLET | Freq: Four times a day (QID) | ORAL | 5 refills | Status: DC | PRN

## 2016-10-01 MED ORDER — MELOXICAM 15 MG PO TABS: 15.0000 mg | ORAL_TABLET | Freq: Every day | ORAL | 2 refills | Status: DC

## 2016-10-01 NOTE — Assessment & Plan Note (Signed)
Previously stable hypothyroidism Continue current Levothyroxine 13mg daily Check TSH, Free T4

## 2016-10-01 NOTE — Patient Instructions (Addendum)
Thank you for coming to the clinic today.  1. Refilled Meloxicam, Tramadol  2. Not filling Fosamax  For DEXA Scan (Bone mineral density) screening for osteoporosis  LAST Done 2013  Call the [place] below anytime to schedule your own appointment now that order has been placed.  [facility] [address], [place] [address] Phone: [phone]  Please consider scheduling DEXA in January 2019.  TDap tetanus shot today, good for 10 years  You will be due for FASTING BLOOD WORK (no food or drink after midnight before, only water or coffee without cream/sugar on the morning of)  [facility] - within 1 week  For Lab Results, once available within 2-3 days of blood draw, you can can log in to MyChart online to view your results and a brief explanation. Also, we can discuss results at next follow-up visit.  Please schedule a Follow-up Appointment to: Return in about 9 months (around 07/02/2017) for Arthritis, Thyroid, osteoporosis.  If you have any other questions or concerns, please feel free to call the clinic or send a message through [facility]. You may also schedule an earlier appointment if necessary.  Additionally, you may be receiving a survey about your experience at our clinic within a few days to 1 week by e-mail or mail. We value your feedback.  [name], DO [place]

## 2016-10-01 NOTE — Assessment & Plan Note (Signed)
Stable previously Check fasting lipids Continue ASA, Rosuvastatin

## 2016-10-01 NOTE — Progress Notes (Signed)
Subjective:    Patient ID: Adrian Zavala, male    DOB: 01-Mar-1951, 66 y.o.   MRN: [id number]  Adrian Zavala is a 66 y.o. male presenting on 10/01/2016 for Annual Exam  HPI   Specialist: Cardiology - Dr Rockey Situ ([facility] [place]) - Atrial valve leaflet abnormality, Descending Aorta Aneurysm Rheumatology - Dr Jefm Bryant J[facility]) - Wegner's  Here for Annual Physical. Not on Medicare yet plans to at age 18. Requesting labs to be ordered for [facility] to be drawn in near future within 1 week. Recently had labs drawn today by Rheumatology, awaiting results.  History of abnormal glucose / A1c - Reports that he has had abnormal blood sugar before, was told elevated A1c does not recall result, not available in chart currently. Elevated glucose 126 in 2016, otherwise normal. No prior dx DM or Pre-DM. He takes chronic prednisone for 26 years, currently at daily dose 31m. - Requesting A1c to be drawn with upcoming labs  Wegener's Granulomatosis Polyarteritis, Chronic - In remission - Followed by Dr KJefm BryantRheumatology, initial diagnosed about >26 years ago, s/p chemotherapy 1991. Last seen 06/2016, he was continued on Prednisone 566mdaily (on steroid >26 years), thought unlikely to come off of prednisone, but also taking MTX, now tapering down on this gradually to see if can get to intermittent dosing and possibly off for lower risk for potential cardiac/vascular surgery in future.  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, hands, hips) - Reports chronic problem with chronic joint pain and stiffness from arthritis. 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, has had some hand and finger joint deformity due to arthritis - Taking Tylenol Arthritis 65036m 2 per dose in afternoon daily - Taking Tramadol 61m51mbs BID with good relief, sometimes needs more often for flares, requesting refill last rx 03/2016 by prior PCP - [facility] - No recent flare or fall or  injury  Hypothyroidism - Chronic problem. Taking Levothyroxine 25mc14mily. Last TSH 03/2016 normal. Request repeat check.  Aneurysm Ascending Aorta / Bicuspid Aortic Valve / HLD / HTN / CAD - Followed by Cardiology Dr GollaRockey Situt visit 04/2016. Had imaging work-up with CT angiogram and ECHO demonstrating increased calcifications and aneurysm 4.1cm, with plans for recurrent imaging to monitor this with potential surgical plans if worsening in size in future. Also identified bicuspid aortic valve on ECHO with chronic calcified valve, thought he may need TEE to better evaluate. - Additionally he reports ideally Rheum could taper down MTX to reduce his potential surgical risk if needed aneurysm repair  Osteoporosis - Reported prior diagnosis, OP Left femur, on DEXA, everywhere else Osteopenia - Vitamin D3 OTC unsure dose thinks up to 1000 iu, and Ca/Vit D supplement daily - Actively taking Fosamax weekly now for >20 years, Last DEXA 2013, interested to update since been 5 years  Health Maintenance: - Due for TDap today - Last colonoscopy 10/25/2014, Dr Wohl,[place] of IBS and diverticulosis, x 1 benign hyperplastic polyp, return in 10 ye62s, 2026 - Due for routine Hep C and HIV screening, will get with upcoming annual labs - UTD on pneumonia vaccine - Due for next DEXA, see above has been diagnosed with osteoporosis, not screening test  Past Medical History:  Diagnosis Date  . Arthritis    Spine, Left Hip, Hands  . 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HCC) [place] GERD (gastroesophageal reflux disease)   . Heart murmur   . Hypercholesteremia   . Hypothyroidism  graves  . Lung disorder    S/P chemo for Wegener's  . Neuromuscular disorder ([place])    some leg weakness S/P Chemo for Wegener's  . Osteopenia   . Osteoporosis    Femur  . Seasonal allergies   . 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[place])    Past Surgical History:  Procedure Laterality Date  . CHOLECYSTECTOMY     has "metal sutures"   . COLONOSCOPY N/A  10/25/2014   Procedure: COLONOSCOPY;  Surgeon: Lucilla Lame, MD;  Location: [facility];  Service: Gastroenterology;  Laterality: N/A;  . TREATMENT FISTULA ANAL     closure with rectal advancement flap   Social History   Social History  . Marital status: Married    Spouse name: Yordy Matton  . Number of children: N/A  . Years of education: N/A   Occupational History  . 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 Smoking status: Former Smoker    Quit date: 08/24/1996  . Smokeless tobacco: Never Used  . Alcohol use 1.8 oz/week    3 Cans of beer per week  . Drug use: No  . Sexual activity: Not on file   Other Topics Concern  . Not on file   Social History Narrative  . No narrative on file   Family History  Problem Relation Age of Onset  . Family history unknown: Yes   Current Outpatient Prescriptions on File Prior to Visit  Medication Sig  . acetaminophen (TYLENOL) 650 MG CR tablet Take 350 mg by mouth every 8 (eight) hours as needed for pain or fever.   Marland Kitchen alendronate (FOSAMAX) 70 MG tablet Take 1 tablet by mouth once every week  . aspirin 81 MG tablet Take 81 mg by mouth daily. AM  . B Complex-C-Folic Acid (B-COMPLEX BALANCED PO) Take 1 tablet by mouth daily.  . calcium carbonate (OS-CAL) 600 MG TABS tablet Take 600 mg by mouth daily. AM  . ezetimibe (ZETIA) 10 MG tablet Take 1 tablet (10 mg total) by mouth daily.  . folic acid (FOLVITE) 132 MCG tablet Take 400 mcg by mouth daily. AM  . levothyroxine (SYNTHROID, LEVOTHROID) 25 MCG tablet TAKE 1 TABLET BY MOUTH  DAILY  . loratadine (CLARITIN) 10 MG tablet Take 10 mg by mouth daily as needed for allergies.  . magnesium oxide (MAG-OX) 400 MG tablet Take 400 mg by mouth daily.  . methotrexate (RHEUMATREX) 2.5 MG tablet Take 15 mg by mouth once a week. Sat or Sun PM  . niacin (NIASPAN) 500 MG CR tablet Take 750 mg by mouth at bedtime.  . predniSONE (DELTASONE) 5 MG tablet Take 5 mg by mouth daily.    . ranitidine (ZANTAC) 150 MG capsule Take 1 capsule by mouth  daily as needed for  heartburn  . Vitamin D, Cholecalciferol, 400 UNITS CAPS Take 400 Units by mouth daily.  . rosuvastatin (CRESTOR) 20 MG tablet Take 1 tablet (20 mg total) by mouth daily.   No current facility-administered medications on file prior to visit.     Review of Systems  Constitutional: Negative for activity change, appetite change, chills, diaphoresis, fatigue and fever.  HENT: Negative for congestion, hearing loss and sinus pressure.   Eyes: Negative for visual disturbance.  Respiratory: Negative for apnea, cough, choking, chest tightness, shortness of breath and wheezing.   Cardiovascular: Negative for chest pain, palpitations and leg swelling.  Gastrointestinal: Negative for abdominal pain, anal bleeding, blood in stool, constipation, diarrhea, nausea and vomiting.  Endocrine:  Negative for cold intolerance and polyuria.  Genitourinary: Negative for decreased urine volume, difficulty urinating, dysuria, frequency and hematuria.  Musculoskeletal: Positive for arthralgias and back pain. Negative for gait problem and neck pain.  Skin: Negative for rash.  Allergic/Immunologic: Negative for environmental allergies.  Neurological: Negative for dizziness, weakness, light-headedness, numbness and headaches.  Hematological: Negative for adenopathy.  Psychiatric/Behavioral: Negative for behavioral problems, dysphoric mood and sleep disturbance. The patient is not nervous/anxious.    Per HPI unless specifically indicated above     Objective:    BP 138/60   Pulse 67   Temp 97.9 F (36.6 C) (Oral)   Resp 16   Ht 5' 8"  (1.727 m)   Wt 182 lb (82.6 kg)   BMI 27.67 kg/m   Wt Readings from Last 3 Encounters:  10/01/16 182 lb (82.6 kg)  05/21/16 180 lb (81.6 kg)  04/16/16 184 lb (83.5 kg)    Physical Exam  Constitutional: He is oriented to person, place, and time. He appears well-developed and well-nourished. No  distress.  Well-appearing, comfortable, cooperative  HENT:  Head: Normocephalic and atraumatic.  Mouth/Throat: Oropharynx is clear and moist.  Frontal / maxillary sinuses non-tender. Nares patent without purulence or edema. Bilateral TMs clear without erythema, effusion or bulging. Oropharynx clear without erythema, exudates, edema or asymmetry.  Eyes: Conjunctivae and EOM are normal. Pupils are equal, round, and reactive to light. Right eye exhibits no discharge. Left eye exhibits no discharge.  Neck: Normal range of motion. Neck supple. No thyromegaly present.  No carotid bruits  Cardiovascular: Normal rate, regular rhythm and intact distal pulses.   Murmur (2/6 systolic murmur loudest R sternal border, do not appreciate significant radiation to carotids) heard. Pulmonary/Chest: Effort normal and breath sounds normal. No respiratory distress. He has no wheezes. He has no rales.  Abdominal: Soft. Bowel sounds are normal. He exhibits no distension and no mass. There is no tenderness.  Musculoskeletal: Normal range of motion. He exhibits no edema or tenderness.  Upper / Lower Extremities: - Normal muscle tone, strength bilateral upper extremities 5/5, lower extremities 5/5  Bilateral hands with chronic joint deformity and DIP nodules on exam.  Lymphadenopathy:    He has no cervical adenopathy.  Neurological: He is alert and oriented to person, place, and time.  Distal sensation intact to light touch all extremities  Skin: Skin is warm and dry. No rash noted. He is not diaphoretic. No erythema.  Psychiatric: He has a normal mood and affect. His behavior is normal.  Well groomed, good eye contact, normal speech and thoughts  Nursing note and vitals reviewed.  Results for orders placed or performed during the hospital encounter of 05/14/16  NM Myocar Multi W/Spect W/Wall Motion / EF  Result Value Ref Range   Rest BP 144/67 mmHg   Exercise duration (sec) 40 sec   Percent HR 90 %    Exercise duration (min) 6 min   Estimated workload 7.0 METS   Peak HR 141 BPM   Peak BP 149 mmHg   SSS 0    SRS 0    SDS 0    TID 0.92    LV sys vol 10 mL   LV dias vol 48 62 - 150 mL   Phase 1 name PRETEST    Stage 1 Name SUPINE    Stage 1 Time 00:00:23    Stage 1 Speed 0.0 mph   Stage 1 Grade 0.0 %   Stage 1 HR 77 bpm   Phase  2 Name Exercise    Stage 2 Name STAGE 1    Stage 2 Attribute Baseline    Stage 2 Time 00:00:01    Stage 2 Speed 0.0 mph   Stage 2 Grade 0.0 %   Stage 2 HR 77 bpm   Phase 3 Name Exercise    Stage 3 Name STAGE 1    Stage 3 Time 00:03:00    Stage 3 Speed 1.7 mph   Stage 3 Grade 10.0 %   Stage 3 HR 114 bpm   Phase 4 Name Exercise    Stage 4 Name STAGE 2    Stage 4 Attribute Peak    Stage 4 Time 00:03:41    Stage 4 Speed 2.5 mph   Stage 4 Grade 12.0 %   Stage 4 HR 141 bpm   Stage 4 SBP 149 mmHg   Stage 4 DBP 73 mmHg   Phase 5 Name Recovery    Stage 5 Attribute Recovery 55mn    Stage 5 Time 00:00:55    Stage 5 Speed 0.0 mph   Stage 5 Grade 0.0 %   Stage 5 HR 129 bpm   Phase 6 Name Recovery    Stage 6 Time 00:04:00    Stage 6 Speed 0.0 mph   Stage 6 Grade 0.0 %   Stage 6 HR 99 bpm   Stage 6 SBP 154 mmHg   Stage 6 DBP 47 mmHg   Percent of predicted max HR 90 %      Assessment & Plan:   Problem List Items Addressed This Visit    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[place]    Stable chronic problem >26 years, in remission currently [facility] K[facility]ry [place] chronic Prednisone 590mdaily Tapering down MTX per Rheum Follow-up routinely      Osteoporosis    Stable osteoporosis L femur and other bones with osteopenia, last DEXA 2013. - On fosamax bisphosphonate chronically >20 years for prophylaxis initially with chronic prednisone, has been on treatment dose now - No fractures  Plan: 1. Finish current bisphosphonate rx, will be out for 2 months, until re-check in 03/2017, ordered DEXA - determine if continue bisphosphonate  vs alternative therapy may need injectable or other med, already followed by Rheum vs consider Endocrine      Relevant Orders   DG Bone Density   Osteoarthritis of multiple joints    Stable osteoarthritis multiple joints, chronic intermittent paints, without complication. Does affect function at times limiting him with hands and back pain. Prior DJD/herniated disc in past. - Prior imaging, none recent  Plan: 1. Refilled Meloxicam 1577maily - continue regularly for now, reviewed risk of NSAID 2. Refilled Tramadol 73m37mbs #120 take q 6 hr PRN - mostly take BID unless flare, +5 refills, continued with same regimen from prior PCP, reviewed [place] CSRS for past 1 year no red flags 3. Follow-up      Relevant Medications   meloxicam (MOBIC) 15 MG tablet   traMADol (ULTRAM) 50 MG tablet   Hypothyroidism    Previously stable hypothyroidism Continue current Levothyroxine 25mc2mily Check TSH, Free T4      Relevant Orders   TSH   T4, free   HLD (hyperlipidemia)    Stable previously Check fasting lipids Continue ASA, Rosuvastatin      Relevant Orders   Lipid panel   Essential hypertension    Stable, mild elevated SBP 130s Continue current regimen Followed by Cardiology      Relevant  Orders   Basic Metabolic Panel (BMET)   Bicuspid aortic valve    Presumed bicuspid aortic valve based on TTE 03/2015, cannot confirm, then CT angio 04/2016 confirms calcification on aortic valve still cannot rule out biscupid - Chronic heart murmur - Followed by Cardiology, anticipate may need TEE in future      Ascending aortic aneurysm (HCC)    Currently stable without recent worsening or acute change Followed by Cardiology Dr Rockey Situ Last imaging CT angio 04/2016, 4.1 cm Continue periodic imaging monitoring, determine if will require surgical repair in future       Other Visit Diagnoses    Annual physical exam    -  Primary   Relevant Orders   Hemoglobin A1c   Lipid panel   Basic Metabolic  Panel (BMET)   Abnormal glucose       Relevant Orders   Hemoglobin A1c   Screening for HIV (human immunodeficiency virus)       Relevant Orders   HIV antibody   Need for hepatitis C screening test       Relevant Orders   Hepatitis C antibody   Screening for prostate cancer       Relevant Orders   PSA   Need for diphtheria-tetanus-pertussis (Tdap) vaccine       Relevant Orders   Tdap vaccine greater than or equal to 7yo IM (Completed)   Vitamin D deficiency       Relevant Orders   VITAMIN D 25 Hydroxy (Vit-D Deficiency, Fractures)   Need for Tdap vaccination          Meds ordered this encounter  Medications  . meloxicam (MOBIC) 15 MG tablet    Sig: Take 1 tablet (15 mg total) by mouth daily.    Dispense:  90 tablet    Refill:  2  . traMADol (ULTRAM) 50 MG tablet    Sig: Take 1 tablet (50 mg total) by mouth every 6 (six) hours as needed for moderate pain.    Dispense:  120 tablet    Refill:  5      Follow up plan: Return in about 9 months (around 07/02/2017) for Arthritis, Thyroid, osteoporosis.  [name], [facility] 10/01/2016, 11:41 PM

## 2016-10-01 NOTE — Assessment & Plan Note (Signed)
Stable osteoporosis L femur and other bones with osteopenia, last DEXA 2013. - On fosamax bisphosphonate chronically >20 years for prophylaxis initially with chronic prednisone, has been on treatment dose now - No fractures  Plan: 1. Finish current bisphosphonate rx, will be out for 2 months, until re-check in 03/2017, ordered DEXA - determine if continue bisphosphonate vs alternative therapy may need injectable or other med, already followed by Rheum vs consider Endocrine

## 2016-10-01 NOTE — Assessment & Plan Note (Signed)
Stable, mild elevated SBP 130s Continue current regimen Followed by Cardiology

## 2016-10-01 NOTE — Assessment & Plan Note (Signed)
Stable osteoarthritis multiple joints, chronic intermittent paints, without complication. Does affect function at times limiting him with hands and back pain. Prior DJD/herniated disc in past. - Prior imaging, none recent  Plan: 1. Refilled Meloxicam 86m daily - continue regularly for now, reviewed risk of NSAID 2. Refilled Tramadol 525mtabs #120 take q 6 hr PRN - mostly take BID unless flare, +5 refills, continued with same regimen from prior PCP, reviewed [place] CSRS for past 1 year no red flags 3. Follow-up

## 2016-10-01 NOTE — Assessment & Plan Note (Signed)
Stable chronic problem >26 years, in remission currently Followed by [facility] Rheumatology Dr Perry Mount on chronic Prednisone 75m daily Tapering down MTX per Rheum Follow-up routinely

## 2016-10-01 NOTE — Assessment & Plan Note (Signed)
Currently stable without recent worsening or acute change Followed by Cardiology Dr Rockey Situ Last imaging CT angio 04/2016, 4.1 cm Continue periodic imaging monitoring, determine if will require surgical repair in future

## 2016-10-01 NOTE — Assessment & Plan Note (Signed)
Presumed bicuspid aortic valve based on TTE 03/2015, cannot confirm, then CT angio 04/2016 confirms calcification on aortic valve still cannot rule out biscupid - Chronic heart murmur - Followed by Cardiology, anticipate may need TEE in future

## 2016-10-09 LAB — TSH: TSH: 3.84 u[IU]/mL (ref 0.450–4.500)

## 2016-10-09 LAB — BASIC METABOLIC PANEL
BUN / CREAT RATIO: 24 (ref 10–24)
BUN: 28 mg/dL — ABNORMAL HIGH (ref 8–27)
CHLORIDE: 102 mmol/L (ref 96–106)
CO2: 23 mmol/L (ref 20–29)
Calcium: 10.2 mg/dL (ref 8.6–10.2)
Creatinine, Ser: 1.15 mg/dL (ref 0.76–1.27)
GFR calc Af Amer: 77 mL/min/{1.73_m2} (ref >59)
GFR calc non Af Amer: 66 mL/min/{1.73_m2} (ref >59)
GLUCOSE: 89 mg/dL (ref 65–99)
POTASSIUM: 4.6 mmol/L (ref 3.5–5.2)
SODIUM: 144 mmol/L (ref 134–144)

## 2016-10-09 LAB — LIPID PANEL
Chol/HDL Ratio: 2.6 ratio (ref 0.0–5.0)
Cholesterol, Total: 167 mg/dL (ref 100–199)
HDL: 65 mg/dL (ref >39)
LDL CALC: 65 mg/dL (ref 0–99)
Triglycerides: 184 mg/dL — ABNORMAL HIGH (ref 0–149)
VLDL CHOLESTEROL CAL: 37 mg/dL (ref 5–40)

## 2016-10-09 LAB — PSA: PROSTATE SPECIFIC AG, SERUM: 0.6 ng/mL (ref 0.0–4.0)

## 2016-10-09 LAB — HEMOGLOBIN A1C
Est. average glucose Bld gHb Est-mCnc: 111 mg/dL
Hgb A1c MFr Bld: 5.5 % (ref 4.8–5.6)

## 2016-10-09 LAB — T4, FREE: Free T4: 1.31 ng/dL (ref 0.82–1.77)

## 2016-10-09 LAB — HIV ANTIBODY (ROUTINE TESTING W REFLEX): HIV Screen 4th Generation wRfx: NONREACTIVE

## 2016-10-09 LAB — HEPATITIS C ANTIBODY

## 2016-10-10 LAB — VITAMIN D 25 HYDROXY (VIT D DEFICIENCY, FRACTURES): VIT D 25 HYDROXY: 49.9 ng/mL (ref 30.0–100.0)

## 2016-10-22 ENCOUNTER — Telehealth: Payer: Self-pay | Admitting: Family Medicine

## 2016-10-22 DIAGNOSIS — M1990 Unspecified osteoarthritis, unspecified site: Secondary | ICD-10-CM

## 2016-10-22 MED ORDER — TRAMADOL HCL 50 MG PO TABS: 50.0000 mg | ORAL_TABLET | Freq: Four times a day (QID) | ORAL | 5 refills | Status: DC | PRN

## 2016-10-22 NOTE — Telephone Encounter (Signed)
Reviewed note from 10/01/16, rx Tramadol printed, +5 refills for 6 month supply, now today he shows up at office stating that he never received rx and may have lost the printed rx. We checked with pharmacy and reviewed [place] CSRS no record of him filling the Tramadol as given. I agree today for a one time printed refill of same rx from 10/01/16, will give to patient today in office, and notify him that if he loses rx again or any other problem with misplacing this rx, then we cannot replace it until 6 months are up, until 04/26/2017.  [name], [facility] 10/22/2016, 2:14 PM

## 2016-11-12 ENCOUNTER — Other Ambulatory Visit: Payer: Self-pay

## 2016-11-12 MED ORDER — ALENDRONATE SODIUM 70 MG PO TABS: ORAL_TABLET | ORAL | 1 refills | Status: DC

## 2016-11-12 MED ORDER — RANITIDINE HCL 150 MG PO CAPS: ORAL_CAPSULE | ORAL | 1 refills | Status: DC

## 2016-12-27 ENCOUNTER — Ambulatory Visit (INDEPENDENT_AMBULATORY_CARE_PROVIDER_SITE_OTHER): Payer: Managed Care, Other (non HMO) | Admitting: Family Medicine

## 2016-12-27 ENCOUNTER — Encounter: Payer: Self-pay | Admitting: Family Medicine

## 2016-12-27 VITALS — BP 126/68 | HR 87 | Temp 98.4°F | Resp 16 | Ht 68.0 in | Wt 181.0 lb

## 2016-12-27 DIAGNOSIS — R6889 Other general symptoms and signs: Secondary | ICD-10-CM

## 2016-12-27 DIAGNOSIS — B349 Viral infection, unspecified: Secondary | ICD-10-CM

## 2016-12-27 LAB — POCT INFLUENZA A/B
INFLUENZA A, POC: NEGATIVE
Influenza B, POC: NEGATIVE

## 2016-12-27 MED ORDER — OSELTAMIVIR PHOSPHATE 75 MG PO CAPS: 75.0000 mg | ORAL_CAPSULE | Freq: Two times a day (BID) | ORAL | 0 refills | Status: DC

## 2016-12-27 MED ORDER — IPRATROPIUM BROMIDE 0.06 % NA SOLN: 2.0000 | Freq: Four times a day (QID) | NASAL | 0 refills | Status: DC

## 2016-12-27 NOTE — Patient Instructions (Addendum)
Thank you for coming to the clinic today.  Your flu test was NEGATIVE, this is not 100% though, and you can still have the flu with a negative test, otherwise it could be a different viral syndrome.  1. I will still print rx for Tamiflu for you - if you are not improved or any worsening within next 24 hours, I would recommend go ahead and start it as a precaution  Start Tamiflu (anti-flu medicine) take one capsule 46m twice a day for 5 days - Wash hands and cover cough very well to avoid spread of infection - For symptom control: - Take existing Tylenol and anti-inflammatory as needed      - Start Atrovent nasal spray decongestant 2 sprays in each nostril up to 4 times daily for 7 days      - Start OTC Mucinex-DM for cough and congestion for up to 7 days - Improve hydration with plenty of clear fluids - Take your current Zofran for nausea and Immodium for diarrhea  If significant worsening with poor fluid intake, worsening fever, difficulty breathing due to coughing, worsening body aches, weakness, or other more concerning symptoms difficulty breathing you can seek treatment at Emergency Department. Also if improved flu symptoms and then worsening days to week later with concerns for bronchitis, productive cough fever chills again we may need to check for possible pneumonia that can occur after the flu  Remember to get Flu Shot later this month at least 2 weeks after current illness  Please schedule a follow-up appointment with Dr. KParks Rangerin 1-2 weeks as needed if worsening from Flu / Bronchitis  Please schedule a Follow-up Appointment to: Return in about 1 week (around 01/03/2017), or if symptoms worsen or fail to improve, for viral syndrome flu like.  If you have any other questions or concerns, please feel free to call the clinic or send a message through [place] You may also schedule an earlier appointment if necessary.  Additionally, you may be receiving a survey about your  experience at our clinic within a few days to 1 week by e-mail or mail. We value your feedback.  ANobie Putnam DO [place] [place]

## 2016-12-27 NOTE — Progress Notes (Signed)
Subjective:    Patient ID: Adrian Zavala, male    DOB: 05-05-1950, 66 y.o.   MRN: [id number]  Adrian Zavala is a 66 y.o. male presenting on 12/27/2016 for Sinusitis (facial pressure, exhausted, diarrhea, can't breath, HA onset last night couldn't sleep)  Patient presents for a same day appointment.  HPI   ACUTE VIRAL SYNDROME / FLU-LIKE ILLNESS - Reports new onset viral symptoms started last night with feeling generalized weakness, feeling faint, had headache, some muscle aches and chills with sweats. Symptoms seemed to be related to sinuses with some deeper sinus pressure without congestion or pain. Also with diarrhea loose stools (non bloody) still present this AM x 3 loose BMs. He has occasional coughing. States it feels similar to last time he was diagnosed with Influezna A last year. 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 - He has Zofran ODT at home, not used yet - Tried Immodium PRN with some relief of diarrhea - Taking Tylenol 645m q 4-6 hour PRN, last dose 2 hours before visit, with relief - Denies worsening headache, neck pain or stiffness, vision changes, chest pain, dyspnea currently, productive cough, further muscle aches or joint pain, vomiting  Health Maintenance: - Due for Flu Shot, will skip today since he is acutely ill with flu like symptoms  [facility] 04/16/2016 11/07/2015 08/15/2015  Decreased Interest 0 0 0  Down, Depressed, Hopeless 0 0 0  PHQ - 2 Score 0 0 0    Social History  Substance Use Topics  . Smoking status: Former Smoker    Quit date: 08/24/1996  . Smokeless tobacco: Never Used  . Alcohol use 1.8 oz/week    3 Cans of beer per week    Review of Systems Per HPI unless specifically indicated above     Objective:    BP  126/68   Pulse 87   Temp 98.4 F (36.9 C) (Oral)   Resp 16   Ht 5' 8"  (1.727 m)   Wt 181 lb (82.1 kg)   SpO2 100%   BMI 27.52 kg/m   Wt Readings from Last 3 Encounters:  12/27/16 181 lb (82.1 kg)  10/01/16 182 lb (82.6 kg)  05/21/16 180 lb (81.6 kg)    Physical Exam  Constitutional: He is oriented to person, place, and time. He appears well-developed and well-nourished. No distress.  Mildly ill and tired appearing, mostly comfortable, cooperative  HENT:  Head: Normocephalic and atraumatic.  Mouth/Throat: Oropharynx is clear and moist.  Frontal / maxillary sinuses non-tender. Nares patent without purulence or edema. Bilateral TMs clear with mild clear effusion R>L without erythema or bulging. Oropharynx clear without erythema, exudates, edema or asymmetry.  Eyes: Conjunctivae are normal. Right eye exhibits no discharge. Left eye exhibits no discharge.  Neck: Normal range of motion. Neck supple. No thyromegaly present.  Cardiovascular: Normal rate, regular rhythm, normal heart sounds and intact distal pulses.   No murmur heard. Pulmonary/Chest: Effort normal and breath sounds normal. No respiratory distress. He has no wheezes. He has no rales.  Good air movement. Speaks full sentences  Musculoskeletal: Normal range of motion. He exhibits no edema.  Lymphadenopathy:    He has no cervical adenopathy.  Neurological: He is alert and oriented to person, place,  and time.  Skin: Skin is warm and dry. No rash noted. He is not diaphoretic. No erythema.  Psychiatric: His behavior is normal.  Nursing note and vitals reviewed.  Results for orders placed or performed in visit on 12/27/16  POCT Influenza A/B  Result Value Ref Range   Influenza A, POC Negative Negative   Influenza B, POC Negative Negative      Assessment & Plan:   Problem List Items Addressed This Visit    None    Visit Diagnoses    Acute viral syndrome    -  Primary   Relevant Medications   oseltamivir (TAMIFLU) 75  MG capsule   ipratropium (ATROVENT) 0.06 % nasal spray   Other Relevant Orders   POCT Influenza A/B (Completed)   Flu-like symptoms       Relevant Medications   oseltamivir (TAMIFLU) 75 MG capsule   Other Relevant Orders   POCT Influenza A/B (Completed)  Clinically concern acute viral syndrome possible adenovirus with URI and GI symptoms vs questionable  Influenza with similar history of same symptoms with flu last year, despite negative rapid flu test today - Concern with exposure to virus at work but not confirmed flu - Duration x 24 days, without complication. Limited PO but seems to be improving and can tolerate some. Not dehydrated on exam - No other focal findings of infection today - Did not receive influenza vaccine this season yet  Plan: 1. Supportive care as advised with NSAID / Tylenol PRN fever/myalgias, improve hydration, may take OTC Cold/Flu meds - Has existing Zofran ODT PRN nausea/vomiting. May use Imodium PRN diarrhea - Start Atrovent nasal spray decongestant 2 sprays in each nostril up to 4 times daily for 7 days 2. If not improved within 24 hours - start Tamiflu 54m capsules BID x 5 days 3. Work note given 4. Return criteria given if significant worsening, consider post-influenza complications, otherwise follow-up if needed       Meds ordered this encounter  Medications  . DISCONTD: predniSONE (DELTASONE) 5 MG tablet    Sig: TAKE 1 TABLET BY MOUTH ONCE DAILY  . oseltamivir (TAMIFLU) 75 MG capsule    Sig: Take 1 capsule (75 mg total) by mouth 2 (two) times daily. For 5 days    Dispense:  10 capsule    Refill:  0  . ipratropium (ATROVENT) 0.06 % nasal spray    Sig: Place 2 sprays into both nostrils 4 (four) times daily. For up to 5-7 days then stop.    Dispense:  15 mL    Refill:  0    Follow up plan: Return in about 1 week (around 01/03/2017), or if symptoms worsen or fail to improve, for viral syndrome flu like.  ANobie Putnam [facility] 12/27/2016, 4:24 PM

## 2017-02-13 ENCOUNTER — Encounter: Payer: Self-pay | Admitting: Family Medicine

## 2017-02-13 ENCOUNTER — Ambulatory Visit: Payer: Managed Care, Other (non HMO) | Admitting: Family Medicine

## 2017-02-13 VITALS — BP 126/66 | HR 72 | Temp 98.5°F | Resp 16 | Ht 68.0 in | Wt 182.0 lb

## 2017-02-13 DIAGNOSIS — N509 Disorder of male genital organs, unspecified: Secondary | ICD-10-CM

## 2017-02-13 DIAGNOSIS — N5089 Other specified disorders of the male genital organs: Secondary | ICD-10-CM

## 2017-02-13 NOTE — Patient Instructions (Addendum)
Thank you for coming to the clinic today.  1. Referral placed UROLOGY stay tuned for apt for Scrotal Mass  [facility] -[address] floor [place],  [place]  [id number] Phone: [phone]  Please schedule a Follow-up Appointment to: Return if symptoms worsen or fail to improve, for Follow-up as scheduled 08/2017.  If you have any other questions or concerns, please feel free to call the clinic or send a message through [place]. You may also schedule an earlier appointment if necessary.  Additionally, you may be receiving a survey about your experience at our clinic within a few days to 1 week by e-mail or mail. We value your feedback.  [name], DO [place]

## 2017-02-13 NOTE — Progress Notes (Signed)
Subjective:    Patient ID: Adrian Zavala, male    DOB: 1950/12/29, 66 y.o.   MRN: [id number]  Adrian Zavala is a 66 y.o. male presenting on 02/13/2017 for Scrotal Mass (onset 3 weeks)   HPI   SCROTAL MASS Reports initial onset 01/24/17 found a "lump" or "nodule" on midline of scrotum, in area of his vasectomy (approx 40 years ago), no prior issues with scrotum or mass. He attempted to drain it on his own thought may be a "boil", used sterile needle and attempted to drain it, no pus, only significant bleeding, felt firm and rubbery, just under surface of skin, it later healed. Not causing pain or erythema. No change in size  Over past 3 weeks. - No history of cancer - Denies any scrotal swelling, pain, redness, drainage, lymph nodes, fever/chills  Health Maintenance: UTD Flu Shot   Depression screen [facility] 2/9 04/16/2016 11/07/2015 08/15/2015  Decreased Interest 0 0 0  Down, Depressed, Hopeless 0 0 0  PHQ - 2 Score 0 0 0    Social History   Tobacco Use  . Smoking status: Former Smoker    Last attempt to quit: 08/24/1996    Years since quitting: 20.4  . Smokeless tobacco: Never Used  Substance Use Topics  . Alcohol use: Yes    Alcohol/week: 1.8 oz    Types: 3 Cans of beer per week  . Drug use: No    Review of Systems Per HPI unless specifically indicated above     Objective:    BP 126/66   Pulse 72   Temp 98.5 F (36.9 C) (Oral)   Resp 16   Ht 5' 8"  (1.727 m)   Wt 182 lb (82.6 kg)   BMI 27.67 kg/m   Wt Readings from Last 3 Encounters:  02/13/17 182 lb (82.6 kg)  12/27/16 181 lb (82.1 kg)  10/01/16 182 lb (82.6 kg)    Physical Exam  Constitutional: He is oriented to person, place, and time. He appears well-developed and well-nourished. No distress.  Well-appearing, comfortable, cooperative  HENT:  Head: Normocephalic and atraumatic.  Mouth/Throat: Oropharynx is clear and moist.  Eyes: Conjunctivae are normal. Right eye exhibits no discharge. Left eye exhibits no  discharge.  Cardiovascular: Normal rate.  Pulmonary/Chest: Effort normal.  Genitourinary: Penis normal.  Genitourinary Comments: Scrotum - Normal male external genitalia - Scrotum appears normal, no edema or erythema, no asymmetry, non tender - Localized 0.5 cm circular raised subcutaneous mass, non tender, firm, no drainage, midline of lower scrotum  Musculoskeletal: He exhibits no edema.  Neurological: He is alert and oriented to person, place, and time.  Skin: Skin is warm and dry. No rash noted. He is not diaphoretic. No erythema.  Psychiatric: He has a normal mood and affect. His behavior is normal.  Well groomed, good eye contact, normal speech and thoughts  Nursing note and vitals reviewed.      Assessment & Plan:   Problem List Items Addressed This Visit    None    Visit Diagnoses    Scrotal mass    -  Primary  Small mass-like subcutaneous lesion, without significant concerning signs or other factors, however seems to be acute onset and significant bleeding (after attempted self drainage). Patient concerned enough to inquire further testing.  Plan: 1. Agree with request for second opinion by Urology and excisional removal / pathology, will hold off on scrotal US at this time due to small size and superficial mass seems less beneficial -  Referral to [facility]     Relevant Orders   Ambulatory referral to Urology      No orders of the defined types were placed in this encounter.   Follow up plan: Return if symptoms worsen or fail to improve, for Follow-up as scheduled 08/2017.  [name], [facility] 02/14/2017, 4:24 PM

## 2017-02-14 ENCOUNTER — Encounter: Payer: Self-pay | Admitting: Family Medicine

## 2017-02-18 ENCOUNTER — Telehealth: Payer: Self-pay | Admitting: Family Medicine

## 2017-02-18 NOTE — Telephone Encounter (Signed)
Pt asked to be referred to another practice because he can't get into [facility] [place] [facility] until January [phone]

## 2017-02-18 NOTE — Telephone Encounter (Signed)
See telephone note from today, 76/54  This is duplicate conversation.  ---------------------- Patient called [facility] back and has scheduled apt in January 2019. Within approx 6 weeks. He is comfortable waiting and I advised him that given the history and exam I am comfortable as well, I do not think this is emergent or urgent, and we will wait until January for Urology evaluation. He will return or notify me or them sooner if any significant changes  [name], DO [facility] 02/18/2017, 4:42 PM

## 2017-02-18 NOTE — Telephone Encounter (Signed)
Patient called [facility] back and has scheduled apt in January 2019. Within approx 6 weeks. He is comfortable waiting and I advised him that given the history and exam I am comfortable as well, I do not think this is emergent or urgent, and we will wait until January for Urology evaluation. He will return or notify me or them sooner if any significant changes  [name], DO [facility] 02/18/2017, 4:42 PM

## 2017-02-18 NOTE — Telephone Encounter (Signed)
See office note from me 11/21 regarding scrotal mass.  I am copying referral information from Clydene Fake at [facility] given to me in staff message today:  "     I called the patient to schedule an appointment and he wanted a Monday. My doctor's are in surgery on Monday's and he didn't want to come in on the 6th, he stated that he didn't really want any imaging done that he wanted to go straight to a biopsy and have it tested. I explained that we didn't do things that way and that he needed a consult and that we would probably have to send him for imaging prior to making any kind of decision on what we would need to do. He said he wanted to think about it and call me back. I did offer him an appointment for Monday the 17th but he said he would be out of town that day.  I advised him to call me back if he changed his mind.   "  If patient contacts [place] back, I would agree with this plan, there is not much else we can do, other than have schedule and consult with Urology as next step.  [name], [facility] 02/18/2017, 1:15 PM

## 2017-03-18 ENCOUNTER — Other Ambulatory Visit: Payer: Self-pay | Admitting: Family Medicine

## 2017-04-08 ENCOUNTER — Ambulatory Visit: Payer: Managed Care, Other (non HMO) | Admitting: Urology

## 2017-05-06 ENCOUNTER — Other Ambulatory Visit: Payer: Self-pay | Admitting: Cardiovascular Disease

## 2017-05-06 ENCOUNTER — Other Ambulatory Visit: Payer: Self-pay | Admitting: Family Medicine

## 2017-05-06 DIAGNOSIS — M1990 Unspecified osteoarthritis, unspecified site: Secondary | ICD-10-CM

## 2017-05-15 ENCOUNTER — Other Ambulatory Visit: Payer: Self-pay | Admitting: Family Medicine

## 2017-05-15 DIAGNOSIS — E039 Hypothyroidism, unspecified: Secondary | ICD-10-CM

## 2017-05-15 MED ORDER — LEVOTHYROXINE SODIUM 25 MCG PO TABS: 25.0000 ug | ORAL_TABLET | Freq: Every day | ORAL | 3 refills | Status: DC

## 2017-05-18 NOTE — Progress Notes (Signed)
Cardiology Office Note  Date:  05/20/2017   ID:  Kentrel Clevenger, DOB 09-Feb-1951, MRN [id number]  PCP:  Olin Hauser, DO   Chief Complaint  Patient presents with  . Other    12 month follow up. Patient denies chest pain and SOB. Meds reviewed verbally with patient.     HPI:  [name] is a very pleasant 67 year old gentleman with a history of Wegener's, s/p chemo 1991,   Cushing's,  long history of smoking who stopped in 1998,  hyperlipidemia on a statin, on chronic prednisone,  presenting for follow-up of his heart murmur (Previous echocardiogram concerning for bicuspid aortic valve),  coronary artery disease, by CT Mild to moderately dilated ascending aorta 4.1 cm in 04/2016  hyperlipidemia.   Off lipitor, He developed myalgias On crestor, several hours of back flank pain Still on the Crestor Active, no chest pain or shortness of breath concerning for angina Active with no regular exercise program  Discussed previous CT coronary calcium score 05/07/2016 Score of 497, 75th percentile based on age Also with calcified aortic valve, Dilated ascending aorta 4.1 cm  Long discussion concerning his dilated aorta He would like to follow this with periodic imaging  Stress test 05/14/2016 performed Showing no ischemia normal ejection fraction  murmur diagnosed in his 20s Previous echocardiogram in 2017 concerning for bicuspid aortic valve  EKG personally reviewed by myself on todays visit Shows normal sinus rhythm rate 72 bpm no significant ST or T wave changes  Other past medical history reviewed  Wegener's, long history of chemotherapy, on chronic methotrexate once per week, on chronic prednisone  Reports he's been on cholesterol medication since the 1980s, when it first came out  Prior history and the [employer], Previous issues with gallbladder disease in his 65s  episode of nausea vomiting in November 2016  PMH:   has a past medical history of Arthritis,  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[place]), GERD (gastroesophageal reflux disease), Heart murmur, Hypercholesteremia, Hypothyroidism, Lung disorder, Neuromuscular disorder ([place]), Osteopenia, Osteoporosis, Seasonal allergies, and 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  PSH:    Past Surgical History:  Procedure Laterality Date  . CHOLECYSTECTOMY     has "metal sutures"   . COLONOSCOPY N/A 10/25/2014   Procedure: COLONOSCOPY;  Surgeon: Lucilla Lame, MD;  Location: [place] [facility];  Service: Gastroenterology;  Laterality: N/A;  . TREATMENT FISTULA ANAL     closure with rectal advancement flap    Current Outpatient Medications  Medication Sig Dispense Refill  . acetaminophen (TYLENOL) 650 MG CR tablet Take 350 mg by mouth every 8 (eight) hours as needed for pain or fever.     Marland Kitchen alendronate (FOSAMAX) 70 MG tablet Take 1 tablet by mouth once every week 12 tablet 1  . aspirin 81 MG tablet Take 81 mg by mouth daily. AM    . B Complex-C-Folic Acid (B-COMPLEX BALANCED PO) Take 1 tablet by mouth daily.    . calcium carbonate (OS-CAL) 600 MG TABS tablet Take 600 mg by mouth daily. AM    . ezetimibe (ZETIA) 10 MG tablet TAKE 1 TABLET BY MOUTH  DAILY 90 tablet 0  . folic acid (FOLVITE) 818 MCG tablet Take 400 mcg by mouth daily. AM    . ipratropium (ATROVENT) 0.06 % nasal spray Place 2 sprays into both nostrils 4 (four) times daily. For up to 5-7 days then stop. 15 mL 0  . levothyroxine (SYNTHROID, LEVOTHROID) 25 MCG tablet Take 1 tablet (25 mcg total) by mouth daily. 90 tablet 3  .  loratadine (CLARITIN) 10 MG tablet Take 10 mg by mouth daily as needed for allergies.    . magnesium oxide (MAG-OX) 400 MG tablet Take 400 mg by mouth daily.    . meloxicam (MOBIC) 15 MG tablet TAKE 1 TABLET BY MOUTH  DAILY 90 tablet 2  . methotrexate (RHEUMATREX) 2.5 MG tablet Take 15 mg by mouth once a week. Sat or Sun PM    . niacin (NIASPAN) 500 MG CR tablet Take 750 mg by mouth at bedtime.    Marland Kitchen oseltamivir (TAMIFLU) 75 MG capsule Take 1 capsule  (75 mg total) by mouth 2 (two) times daily. For 5 days 10 capsule 0  . predniSONE (DELTASONE) 5 MG tablet Take 5 mg by mouth daily.    . ranitidine (ZANTAC) 150 MG capsule TAKE 1 CAPSULE BY MOUTH  DAILY AS NEEDED FOR  HEARTBURN 90 capsule 2  . rosuvastatin (CRESTOR) 20 MG tablet TAKE 1 TABLET BY MOUTH  DAILY 90 tablet 0  . traMADol (ULTRAM) 50 MG tablet Take 1 tablet (50 mg total) by mouth every 6 (six) hours as needed for moderate pain. 120 tablet 5  . Vitamin D, Cholecalciferol, 400 UNITS CAPS Take 400 Units by mouth daily.     No current facility-administered medications for this visit.      Allergies:   Sulfa antibiotics; Sulfamethoxazole-trimethoprim; Penicillins; Codeine; Prilosec [omeprazole]; Tetracycline; and Tetracyclines & related   Social History:  The patient  reports that he quit smoking about 20 years ago. he has never used smokeless tobacco. He reports that he drinks about 1.8 oz of alcohol per week. He reports that he does not use drugs.   Family History:   Family history is unknown by patient.    Review of Systems: Review of Systems  Constitutional: Negative.   Respiratory: Negative.   Cardiovascular: Negative.   Gastrointestinal: Negative.   Musculoskeletal: Negative.   Neurological: Negative.   Psychiatric/Behavioral: Negative.   All other systems reviewed and are negative.    PHYSICAL EXAM: VS:  BP 140/66 (BP Location: Left Arm, Patient Position: Sitting, Cuff Size: Normal)   Pulse 72   Ht 5' 8"  (1.727 m)   Wt 182 lb (82.6 kg)   BMI 27.67 kg/m  , BMI Body mass index is 27.67 kg/m. Constitutional:  oriented to person, place, and time. No distress.  HENT:  Head: Normocephalic and atraumatic.  Eyes:  no discharge. No scleral icterus.  Neck: Normal range of motion. Neck supple. No JVD present.  Cardiovascular: Normal rate, regular rhythm, normal heart sounds and intact distal pulses. Exam reveals no gallop and no friction rub. No edema No murmur  heard. Pulmonary/Chest: Effort normal and breath sounds normal. No stridor. No respiratory distress.  no wheezes.  no rales.  no tenderness.  Abdominal: Soft.  no distension.  no tenderness.  Musculoskeletal: Normal range of motion.  no  tenderness or deformity.  Neurological:  normal muscle tone. Coordination normal. No atrophy Skin: Skin is warm and dry. No rash noted. not diaphoretic.  Psychiatric:  normal mood and affect. behavior is normal. Thought content normal.   Recent Labs: 10/08/2016: BUN 28; Creatinine, Ser 1.15; Potassium 4.6; Sodium 144; TSH 3.840    Lipid Panel Lab Results  Component Value Date   CHOL 167 10/08/2016   HDL 65 10/08/2016   LDLCALC 65 10/08/2016   TRIG 184 (H) 10/08/2016      Wt Readings from Last 3 Encounters:  05/20/17 182 lb (82.6 kg)  02/13/17 182 lb (82.6  kg)  12/27/16 181 lb (82.1 kg)     ASSESSMENT AND PLAN:  Mixed hyperlipidemia Stressed importance of aggressive cholesterol control Repeat lipid panel pending with primary care Goal total cholesterol less than 150 If it runs high we can increase Crestor up to 40 mg daily  Essential hypertension Blood pressure is well controlled on today's visit. No changes made to the medications.  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 EKG 12-Lead     Signed, Esmond Plants, M.D., Ph.D. 05/20/2017  [place], [place]

## 2017-05-20 ENCOUNTER — Encounter: Payer: Self-pay | Admitting: Cardiovascular Disease

## 2017-05-20 ENCOUNTER — Ambulatory Visit: Payer: Managed Care, Other (non HMO) | Admitting: Cardiovascular Disease

## 2017-05-20 VITALS — BP 140/66 | HR 72 | Ht 68.0 in | Wt 182.0 lb

## 2017-05-20 DIAGNOSIS — I10 Essential (primary) hypertension: Secondary | ICD-10-CM

## 2017-05-20 DIAGNOSIS — I712 Thoracic aortic aneurysm, without rupture: Secondary | ICD-10-CM | POA: Diagnosis not present

## 2017-05-20 DIAGNOSIS — I25118 Atherosclerotic heart disease of native coronary artery with other forms of angina pectoris: Secondary | ICD-10-CM | POA: Diagnosis not present

## 2017-05-20 DIAGNOSIS — I7121 Aneurysm of the ascending aorta, without rupture: Secondary | ICD-10-CM

## 2017-05-20 DIAGNOSIS — Q231 Congenital insufficiency of aortic valve: Secondary | ICD-10-CM

## 2017-05-20 DIAGNOSIS — E782 Mixed hyperlipidemia: Secondary | ICD-10-CM

## 2017-05-20 NOTE — Patient Instructions (Addendum)
Goal LDL <70 Goal total chol <150   Medication Instructions:   No medication changes made  Research losartan and dilated ascending aorta  Labwork:  No new labs needed  Testing/Procedures:  We will order echocardiogram for aortic valve disease, dilated ascending aorta   Follow-Up: It was a pleasure seeing you in the office today. Please call us if you have new issues that need to be addressed before your next appt.  [phone]  Your physician wants you to follow-up in: 12 months.  You will receive a reminder letter in the mail two months in advance. If you don't receive a letter, please call our office to schedule the follow-up appointment.  If you need a refill on your cardiac medications before your next appointment, please call your pharmacy.  For educational health videos Log in to : [email or web address] Or : [email or web address], password : [email or web address]

## 2017-05-27 ENCOUNTER — Ambulatory Visit: Payer: Managed Care, Other (non HMO) | Admitting: Cardiovascular Disease

## 2017-06-03 ENCOUNTER — Ambulatory Visit
Admission: RE | Admit: 2017-06-03 | Discharge: 2017-06-03 | Disposition: A | Discharge status: Home / Self Care | Payer: Managed Care, Other (non HMO) | Source: Ambulatory Visit | Attending: Family Medicine | Admitting: Family Medicine

## 2017-06-03 DIAGNOSIS — Z79899 Other long term (current) drug therapy: Secondary | ICD-10-CM | POA: Diagnosis present

## 2017-06-03 DIAGNOSIS — M818 Other osteoporosis without current pathological fracture: Secondary | ICD-10-CM | POA: Diagnosis present

## 2017-06-03 DIAGNOSIS — M8588 Other specified disorders of bone density and structure, other site: Secondary | ICD-10-CM | POA: Diagnosis present

## 2017-06-10 ENCOUNTER — Ambulatory Visit (INDEPENDENT_AMBULATORY_CARE_PROVIDER_SITE_OTHER): Payer: Managed Care, Other (non HMO)

## 2017-06-10 ENCOUNTER — Other Ambulatory Visit: Payer: Self-pay

## 2017-06-10 DIAGNOSIS — I25118 Atherosclerotic heart disease of native coronary artery with other forms of angina pectoris: Secondary | ICD-10-CM | POA: Diagnosis not present

## 2017-06-10 DIAGNOSIS — Q231 Congenital insufficiency of aortic valve: Secondary | ICD-10-CM

## 2017-06-10 DIAGNOSIS — I712 Thoracic aortic aneurysm, without rupture: Secondary | ICD-10-CM | POA: Diagnosis not present

## 2017-06-10 DIAGNOSIS — I350 Nonrheumatic aortic (valve) stenosis: Secondary | ICD-10-CM

## 2017-06-10 DIAGNOSIS — I7121 Aneurysm of the ascending aorta, without rupture: Secondary | ICD-10-CM

## 2017-06-10 HISTORY — DX: Nonrheumatic aortic (valve) stenosis: I35.0

## 2017-06-17 ENCOUNTER — Encounter: Payer: Self-pay | Admitting: Family Medicine

## 2017-06-20 ENCOUNTER — Other Ambulatory Visit: Payer: Self-pay | Admitting: Family Medicine

## 2017-06-20 DIAGNOSIS — M1990 Unspecified osteoarthritis, unspecified site: Secondary | ICD-10-CM

## 2017-07-08 ENCOUNTER — Other Ambulatory Visit: Payer: Self-pay | Admitting: Family Medicine

## 2017-08-05 ENCOUNTER — Other Ambulatory Visit: Payer: Self-pay | Admitting: Cardiovascular Disease

## 2017-08-30 ENCOUNTER — Other Ambulatory Visit: Payer: Self-pay | Admitting: Cardiovascular Disease

## 2017-09-09 ENCOUNTER — Ambulatory Visit: Payer: Managed Care, Other (non HMO) | Admitting: Family Medicine

## 2017-09-16 ENCOUNTER — Other Ambulatory Visit: Payer: Self-pay | Admitting: Cardiovascular Disease

## 2017-10-21 ENCOUNTER — Other Ambulatory Visit: Payer: Self-pay | Admitting: Family Medicine

## 2017-10-21 ENCOUNTER — Ambulatory Visit (INDEPENDENT_AMBULATORY_CARE_PROVIDER_SITE_OTHER): Payer: Managed Care, Other (non HMO) | Admitting: Family Medicine

## 2017-10-21 ENCOUNTER — Telehealth: Payer: Self-pay | Admitting: Cardiovascular Disease

## 2017-10-21 ENCOUNTER — Encounter: Payer: Self-pay | Admitting: Family Medicine

## 2017-10-21 VITALS — BP 122/71 | HR 79 | Temp 98.4°F | Ht 65.0 in | Wt 185.0 lb

## 2017-10-21 DIAGNOSIS — M818 Other osteoporosis without current pathological fracture: Secondary | ICD-10-CM

## 2017-10-21 DIAGNOSIS — I10 Essential (primary) hypertension: Secondary | ICD-10-CM | POA: Diagnosis not present

## 2017-10-21 DIAGNOSIS — M159 Polyosteoarthritis, unspecified: Secondary | ICD-10-CM

## 2017-10-21 DIAGNOSIS — E782 Mixed hyperlipidemia: Secondary | ICD-10-CM | POA: Diagnosis not present

## 2017-10-21 DIAGNOSIS — N401 Enlarged prostate with lower urinary tract symptoms: Secondary | ICD-10-CM

## 2017-10-21 DIAGNOSIS — M150 Primary generalized (osteo)arthritis: Secondary | ICD-10-CM

## 2017-10-21 DIAGNOSIS — M1990 Unspecified osteoarthritis, unspecified site: Secondary | ICD-10-CM

## 2017-10-21 DIAGNOSIS — I712 Thoracic aortic aneurysm, without rupture: Secondary | ICD-10-CM

## 2017-10-21 DIAGNOSIS — E039 Hypothyroidism, unspecified: Secondary | ICD-10-CM

## 2017-10-21 DIAGNOSIS — M3130 Wegener's granulomatosis without renal involvement: Secondary | ICD-10-CM

## 2017-10-21 DIAGNOSIS — R7309 Other abnormal glucose: Secondary | ICD-10-CM

## 2017-10-21 DIAGNOSIS — Z0000 Encounter for general adult medical examination without abnormal findings: Secondary | ICD-10-CM

## 2017-10-21 DIAGNOSIS — I7121 Aneurysm of the ascending aorta, without rupture: Secondary | ICD-10-CM

## 2017-10-21 DIAGNOSIS — R351 Nocturia: Secondary | ICD-10-CM

## 2017-10-21 NOTE — Telephone Encounter (Signed)
Routing to Dr Rockey Situ to make him aware. Looks like PCP drew Lipid panel today. Any further recommendations??

## 2017-10-21 NOTE — Telephone Encounter (Signed)
Patient calling States that the medication rosuvastatin (CRESTOR) 20 MG had been giving him pain in ribcage area Patient has since been cutting the 20 MG in half and it has been helping - wanted to make office aware  Please call to discuss

## 2017-10-21 NOTE — Patient Instructions (Addendum)
Thank you for coming to the office today.  Refilled Tramadol - stay tuned - it was sent to pharmacy  Call Dr Rockey Situ to let him know taking HALF of Rosuvastatin 48m - need new rx updated for future records.  Stay tuned for thyroid dose change or refill  Continue with Cardiology 04/2018  We reviewed labs from Dr KJefm Bryant May try oral capsule probiotic if interested  DUE for FASTING BLOOD WORK (no food or drink after midnight before the lab appointment, only water or coffee without cream/sugar on the morning of)  [facility] - stay tuned for results  For Lab Results, once available within 2-3 days of blood draw, you can can log in to MyChart online to view your results and a brief explanation. Also, we can discuss results at next follow-up visit.  Please schedule a Follow-up Appointment to: Return in about 6 months (around 04/23/2018) for 6 months follow-up Arthritis/Chronic Pain med refill, updates.  If you have any other questions or concerns, please feel free to call the office or send a message through [facility] You may also schedule an earlier appointment if necessary.  Additionally, you may be receiving a survey about your experience at our office within a few days to 1 week by e-mail or mail. We value your feedback.  ANobie Putnam DO [facility]

## 2017-10-21 NOTE — Progress Notes (Signed)
Subjective:    Patient ID: Adrian Zavala, male    DOB: 1950-07-15, 67 y.o.   MRN: [id number]  Adrian Zavala is a 67 y.o. male presenting on 10/21/2017 for Annual Exam   HPI   Here for Annual Physical and due for lab orders. Recently had labs drawn today by Rheumatology Jefm Bryant, requesting lab review)  Specialist: Cardiology - Dr Rockey Situ ([facility]) - Atrial valve leaflet abnormality, Descending Aorta Aneurysm Rheumatology - Dr Jefm Bryant Jefm Bryant Rhe[facility]) - Wegner's granulomatosis  Elevated A1c Last lab 09/2016 showed elevated A1c to 5.5. No prior dx PreDM or DM. No recent lab. Due for A1c repeat Tries to improve diet and low sugar. He does admit takes chronic prednisone >25+ years for Rheumatology 31m daily  HYPERLIPIDEMIA: - Reports no concerns. Last lipid panel 09/2016 - Currently taking Zetia 126mand Rosuvastatin 1021maily (HALF of 60m23mreduced recently), tolerating well without side effects or myalgias  Wegener's Granulomatosis Polyarteritis, Chronic - In remission - Followed by Dr KernJefm Bryantumatology, last visit 09/2017, had labs. See prior note for background information. Remains on chronic prednisone therapy and MTX.   Osteoporosis, suspected secondary to chronic prednisone [facility] Long history. Previous DEXA 2013 now most recent DEXA 05/2017 showed L Femur T score -3.0. Had been on chronic fosamax determined to be ineffective. Now pending pre-cert for Prolia injection therapy per Rheum.  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, hands, hips) - Reports chronic problem with chronic joint pain and stiffness from arthritis. 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, has had some hand and finger joint deformity due to arthritis - Taking Tylenol Arthritis 650mg42m per dose in afternoon and evening with some relief - Taking Tramadol 50mg 86m BID with good relief, sometimes needs more often for flares, requesting refill now  due - he feels like needs up to 3 times daily sometimes.  Hypothyroidism - Chronic problem. Taking Levothyroxine 25mcg 36my. Last TSH 09/2016 normal. Request repeat check.  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 Reviewed updates and results. ECHO showed smaller aneurysm (3.9) compared to CT imaging (4.1). Atrial valve bicuspid most likely compared to tricuspid, he thinks congenital heart aneurysm as possibility. He has history of dyspnea as child with significant exertion. No stress test this last time  Tinnitus, constant bilateral - he had audiology test in early 2019, had problem with more constant issue, now 1.5 years, seems persistent, in past he had episodic tinnitus. They said it is not entirely affecting his hearing to point he needs hearing aids. It is mild in general  Additional history - Admits some change in bowel habits with episodic most days normal, few days will have darker vs lighter stool, sometimes odor, he does eat greek yogurt probiotic daily, can go 1-2 weeks normal without change. Otherwise Asymptomatic.   Health Maintenance: - Last colonoscopy 10/25/2014, Dr Wohl, hAllen Norrisry of IBS and diverticulosis, x 1 benign hyperplastic polyp, return in 10 year14 2026  - UTD routine Hep C and HIV negative  - UTD on pneumonia vaccine  Prostate CA Screening: Prior PSA / DRE reported normal. Last PSA 0.6 (09/2016). Currently with BPH LUTS. No known family history of prostate CA.    Depression screen PHQ 2/9[facility]29/2019 04/16/2016 11/07/2015  Decreased Interest 0 0 0  Down, Depressed, Hopeless 0 0 0  PHQ - 2 Score 0 0 0  Past Medical History:  Diagnosis Date  . Arthritis    Spine, Left Hip, Hands  . 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[place])   . GERD (gastroesophageal reflux disease)   . Heart murmur   . Hypercholesteremia   . Hypothyroidism    graves  . Lung disorder    S/P chemo for Wegener's  . Neuromuscular  disorder ([place])    some leg weakness S/P Chemo for Wegener's  . Osteopenia   . Osteoporosis    Femur  . Seasonal allergies   . 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[place])    Past Surgical History:  Procedure Laterality Date  . CHOLECYSTECTOMY     has "metal sutures"   . COLONOSCOPY N/A 10/25/2014   Procedure: COLONOSCOPY;  Surgeon: Lucilla Lame, MD;  Location: [place];  Service: Gastroenterology;  Laterality: N/A;  . TREATMENT FISTULA ANAL     closure with rectal advancement flap   Social History   Socioeconomic History  . Marital status: Married    Spouse name: Yeshua Stryker  . Number of children: Not on file  . Years of education: Not on file  . Highest education level: Not on file  Occupational History  . 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 Financial resource strain: Not on file  . Food insecurity:    Worry: Not on file    Inability: Not on file  . Transportation needs:    Medical: Not on file    Non-medical: Not on file  Tobacco Use  . Smoking status: Former Smoker    Last attempt to quit: 08/24/1996    Years since quitting: 21.1  . Smokeless tobacco: Never Used  Substance and Sexual Activity  . Alcohol use: Yes    Alcohol/week: 1.8 oz    Types: 3 Cans of beer per week  . Drug use: No  . Sexual activity: Not on file  Lifestyle  . Physical activity:    Days per week: Not on file    Minutes per session: Not on file  . Stress: Not on file  Relationships  . Social connections:    Talks on phone: Not on file    Gets together: Not on file    Attends religious service: Not on file    Active member of club or organization: Not on file    Attends meetings of clubs or organizations: Not on file    Relationship status: Not on file  . Intimate partner violence:    Fear of current or ex partner: Not on file    Emotionally abused: Not on file    Physically abused: Not on file    Forced sexual activity: Not on file  Other Topics Concern  . Not  on file  Social History Narrative  . Not on file   Family History  Family history unknown: Yes   Current Outpatient Medications on File Prior to Visit  Medication Sig  . acetaminophen (TYLENOL) 650 MG CR tablet Take 350 mg by mouth every 8 (eight) hours as needed for pain or fever.   Marland Kitchen aspirin 81 MG tablet Take 81 mg by mouth daily. AM  . B Complex-C-Folic Acid (B-COMPLEX BALANCED PO) Take 1 tablet by mouth daily.  . calcium carbonate (OS-CAL) 600 MG TABS tablet Take 600 mg by mouth daily. AM  . ezetimibe (ZETIA) 10 MG tablet TAKE 1 TABLET BY MOUTH  DAILY  . folic acid (FOLVITE) 545 MCG tablet Take 400 mcg by mouth daily. AM  . levothyroxine (SYNTHROID, LEVOTHROID) 25  MCG tablet Take 1 tablet (25 mcg total) by mouth daily.  Marland Kitchen loratadine (CLARITIN) 10 MG tablet Take 10 mg by mouth daily as needed for allergies.  . magnesium oxide (MAG-OX) 400 MG tablet Take 400 mg by mouth daily.  . meloxicam (MOBIC) 15 MG tablet TAKE 1 TABLET BY MOUTH  DAILY  . methotrexate (RHEUMATREX) 2.5 MG tablet Take 15 mg by mouth once a week. Sat or Sun PM  . niacin (NIASPAN) 500 MG CR tablet Take 750 mg by mouth at bedtime.  . predniSONE (DELTASONE) 5 MG tablet Take 5 mg by mouth daily.  . ranitidine (ZANTAC) 150 MG capsule TAKE 1 CAPSULE BY MOUTH  DAILY AS NEEDED FOR  HEARTBURN  . rosuvastatin (CRESTOR) 20 MG tablet Take 0.5 tablets (10 mg total) by mouth daily.  . Vitamin D, Cholecalciferol, 400 UNITS CAPS Take 400 Units by mouth daily.   No current facility-administered medications on file prior to visit.     Review of Systems  Constitutional: Negative for activity change, appetite change, chills, diaphoresis, fatigue and fever.  HENT: Positive for tinnitus (stable chronic). Negative for congestion and hearing loss.   Eyes: Negative for visual disturbance.  Respiratory: Negative for apnea, cough, choking, chest tightness, shortness of breath and wheezing.   Cardiovascular: Negative for chest pain,  palpitations and leg swelling.  Gastrointestinal: Negative for abdominal pain, anal bleeding, blood in stool, constipation, diarrhea, nausea and vomiting.  Endocrine: Negative for cold intolerance.  Genitourinary: Negative for difficulty urinating, dysuria, frequency and hematuria.  Musculoskeletal: Negative for arthralgias, back pain and neck pain.  Skin: Negative for rash.  Allergic/Immunologic: Negative for environmental allergies.  Neurological: Negative for dizziness, weakness, light-headedness, numbness and headaches.  Hematological: Negative for adenopathy.  Psychiatric/Behavioral: Negative for behavioral problems, dysphoric mood and sleep disturbance. The patient is not nervous/anxious.    Per HPI unless specifically indicated above     Objective:    BP 122/71   Pulse 79   Temp 98.4 F (36.9 C) (Oral)   Ht 5' 5"  (1.651 m)   Wt 185 lb (83.9 kg)   SpO2 (!) 16%   BMI 30.79 kg/m   Wt Readings from Last 3 Encounters:  10/21/17 185 lb (83.9 kg)  05/20/17 182 lb (82.6 kg)  02/13/17 182 lb (82.6 kg)    Physical Exam  Constitutional: He is oriented to person, place, and time. He appears well-developed and well-nourished. No distress.  Well-appearing, comfortable, cooperative  HENT:  Head: Normocephalic and atraumatic.  Mouth/Throat: Oropharynx is clear and moist.  Frontal / maxillary sinuses non-tender. Nares patent without purulence or edema. Bilateral TMs clear without erythema, effusion or bulging. Oropharynx clear without erythema, exudates, edema or asymmetry.  Eyes: Pupils are equal, round, and reactive to light. Conjunctivae and EOM are normal. Right eye exhibits no discharge. Left eye exhibits no discharge.  Neck: Normal range of motion. Neck supple. No thyromegaly present.  Cardiovascular: Normal rate, regular rhythm, normal heart sounds and intact distal pulses.  No murmur heard. Pulmonary/Chest: Effort normal and breath sounds normal. No respiratory distress. He has  no wheezes. He has no rales.  Abdominal: Soft. Bowel sounds are normal. He exhibits no distension and no mass. There is no tenderness.  Musculoskeletal: Normal range of motion. He exhibits no edema or tenderness.  Upper / Lower Extremities: - Normal muscle tone, strength bilateral upper extremities 5/5, lower extremities 5/5  Lymphadenopathy:    He has no cervical adenopathy.  Neurological: He is alert and oriented to person,  place, and time.  Distal sensation intact to light touch all extremities  Skin: Skin is warm and dry. No rash noted. He is not diaphoretic. No erythema.  Psychiatric: He has a normal mood and affect. His behavior is normal.  Well groomed, good eye contact, normal speech and thoughts  Nursing note and vitals reviewed.      Assessment & Plan:   Problem List Items Addressed This Visit    Arthritis, degenerative    Underlying etiology for chronic pain with OA/DJD multiple joints Controlled on Tylenol and Tramadol - Refill tramadol today      Ascending aortic aneurysm (HCC)    Currently stable without recent worsening or acute change Followed by Cardiology Dr Rockey Situ Last imaging ECHO - showed improved measurement Continue periodic imaging monitoring, determine if will require surgical repair in future      Relevant Medications   rosuvastatin (CRESTOR) 20 MG tablet   BPH (benign prostatic hyperplasia)    History of BPH without significant symptoms currently Check PSA Follow-up as needed if not improving      Relevant Orders   PSA   Essential hypertension    Well-controlled HTN No known complications    Plan:  1. Remain off anti HTN therapy 2. Encourage improved lifestyle - low sodium diet, regular exercise 3. Continue monitor BP outside office, bring readings to next visit, if persistently >140/90 or new symptoms notify office sooner 4. Follow-up q 6 months      Relevant Medications   rosuvastatin (CRESTOR) 20 MG tablet   Other Relevant Orders    TSH   T4, free   HLD (hyperlipidemia)    Previously controlled cholesterol on statin and lifestyle Last lipid panel due now 09/2017  Plan: 1. Continue current meds - Rosuvastatin 39m daily (half of 263m, Zetia 1080maily 2. Continue ASA 52m44mr primary ASCVD risk reduction 3. Encourage improved lifestyle - low carb/cholesterol, reduce portion size, continue improving regular exercise as tolerated 4. Follow-up 6 months       Relevant Medications   rosuvastatin (CRESTOR) 20 MG tablet   Other Relevant Orders   Lipid panel   Hypothyroidism    Previously controlled hypothyroidism Continue current Levothyroxine 25mc86mily Will re-check TSH Free T4 - pending [facility]      Relevant Orders   TSH   T4, free   Osteoporosis    Worsening osteoporosis L femur now on updated DEXA 05/2017 Seems to have clinically failed Fosamax therapy, prior chronic use >20 years - Secondary to chronic prednisone use - [facility] now [facility] for Prolia      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HCC) [place] >26-27 years, in remission currently [facility] KernoPerry Mounthronic Prednisone 5mg d93my Tapering down MTX per Rheum Follow-up routinely      Relevant Medications   rosuvastatin (CRESTOR) 20 MG tablet    Other Visit Diagnoses    Annual physical exam    -  Primary  Updated Health Maintenance information eviewed recent lab results with patient Encouraged improvement to lifestyle with diet and exercise     Relevant Orders   Hemoglobin A1c   Lipid panel   Elevated hemoglobin A1c       Relevant Orders   Hemoglobin A1c      No orders of the defined types were placed in this encounter.   Follow up plan: Return in about 6 months (around 04/23/2018) for 6 months follow-up Arthritis/Chronic Pain med refill,  updates.  Active lab orders printed and given to patient today - will f/u [facility] test results.  [name],  [facility] 10/21/2017, 2:34 PM

## 2017-10-22 NOTE — Assessment & Plan Note (Signed)
Currently stable without recent worsening or acute change Followed by Cardiology Dr Rockey Situ Last imaging ECHO - showed improved measurement Continue periodic imaging monitoring, determine if will require surgical repair in future

## 2017-10-22 NOTE — Assessment & Plan Note (Signed)
Underlying etiology for chronic pain with OA/DJD multiple joints Controlled on Tylenol and Tramadol - Refill tramadol today

## 2017-10-22 NOTE — Assessment & Plan Note (Signed)
Previously controlled cholesterol on statin and lifestyle Last lipid panel due now 09/2017  Plan: 1. Continue current meds - Rosuvastatin 37m daily (half of 270m, Zetia 1090maily 2. Continue ASA 5m37mr primary ASCVD risk reduction 3. Encourage improved lifestyle - low carb/cholesterol, reduce portion size, continue improving regular exercise as tolerated 4. Follow-up 6 months

## 2017-10-22 NOTE — Assessment & Plan Note (Signed)
Stable chronic problem >26-27 years, in remission currently Followed by [facility] Rheumatology Dr Perry Mount on chronic Prednisone 57m daily Tapering down MTX per Rheum Follow-up routinely

## 2017-10-22 NOTE — Assessment & Plan Note (Signed)
Well-controlled HTN No known complications    Plan:  1. Remain off anti HTN therapy 2. Encourage improved lifestyle - low sodium diet, regular exercise 3. Continue monitor BP outside office, bring readings to next visit, if persistently >140/90 or new symptoms notify office sooner 4. Follow-up q 6 months

## 2017-10-22 NOTE — Assessment & Plan Note (Signed)
History of BPH without significant symptoms currently Check PSA Follow-up as needed if not improving

## 2017-10-22 NOTE — Assessment & Plan Note (Signed)
Worsening osteoporosis L femur now on updated DEXA 05/2017 Seems to have clinically failed Fosamax therapy, prior chronic use >20 years - Secondary to chronic prednisone use - Followed by [facility] Rheum now awaiting pre-cert for Prolia

## 2017-10-22 NOTE — Assessment & Plan Note (Signed)
Previously controlled hypothyroidism Continue current Levothyroxine 2mg daily Will re-check TSH Free T4 - pending [facility]

## 2017-10-23 LAB — T4, FREE: Free T4: 1.28 ng/dL (ref 0.82–1.77)

## 2017-10-23 LAB — LIPID PANEL
Chol/HDL Ratio: 2.8 ratio (ref 0.0–5.0)
Cholesterol, Total: 170 mg/dL (ref 100–199)
HDL: 61 mg/dL (ref >39)
LDL Calculated: 74 mg/dL (ref 0–99)
Triglycerides: 176 mg/dL — ABNORMAL HIGH (ref 0–149)
VLDL Cholesterol Cal: 35 mg/dL (ref 5–40)

## 2017-10-23 LAB — PSA: Prostate Specific Ag, Serum: 0.6 ng/mL (ref 0.0–4.0)

## 2017-10-23 LAB — HEMOGLOBIN A1C
ESTIMATED AVERAGE GLUCOSE: 114 mg/dL
Hgb A1c MFr Bld: 5.6 % (ref 4.8–5.6)

## 2017-10-23 LAB — TSH: TSH: 3.08 u[IU]/mL (ref 0.450–4.500)

## 2017-10-23 NOTE — Telephone Encounter (Signed)
Would see how he goes for a while on Crestor 10 daily with Zetia He is close to goal, slightly above but not bad

## 2017-10-24 ENCOUNTER — Encounter: Payer: Self-pay | Admitting: Family Medicine

## 2017-10-24 DIAGNOSIS — R7309 Other abnormal glucose: Secondary | ICD-10-CM | POA: Insufficient documentation

## 2017-10-24 NOTE — Telephone Encounter (Signed)
No answer. Left detail message with Dr Donivan Scull recommendations, ok per DPR, and to call back if any questions.

## 2017-10-24 NOTE — Telephone Encounter (Signed)
Left message to call back  

## 2017-11-06 ENCOUNTER — Other Ambulatory Visit: Payer: Self-pay | Admitting: Cardiovascular Disease

## 2017-11-06 ENCOUNTER — Other Ambulatory Visit: Payer: Self-pay | Admitting: Family Medicine

## 2017-11-06 DIAGNOSIS — K219 Gastro-esophageal reflux disease without esophagitis: Secondary | ICD-10-CM

## 2017-11-22 ENCOUNTER — Emergency Department: Payer: Managed Care, Other (non HMO)

## 2017-11-22 ENCOUNTER — Encounter: Payer: Self-pay | Admitting: Emergency Medicine

## 2017-11-22 ENCOUNTER — Emergency Department
Admission: EM | Admit: 2017-11-22 | Discharge: 2017-11-23 | Disposition: A | Discharge status: Home / Self Care | Payer: Managed Care, Other (non HMO) | Attending: Emergency Medicine | Admitting: Emergency Medicine

## 2017-11-22 DIAGNOSIS — Z87891 Personal history of nicotine dependence: Secondary | ICD-10-CM | POA: Diagnosis not present

## 2017-11-22 DIAGNOSIS — E039 Hypothyroidism, unspecified: Secondary | ICD-10-CM | POA: Insufficient documentation

## 2017-11-22 DIAGNOSIS — I10 Essential (primary) hypertension: Secondary | ICD-10-CM | POA: Diagnosis not present

## 2017-11-22 DIAGNOSIS — R200 Anesthesia of skin: Secondary | ICD-10-CM | POA: Insufficient documentation

## 2017-11-22 DIAGNOSIS — R52 Pain, unspecified: Secondary | ICD-10-CM | POA: Diagnosis not present

## 2017-11-22 DIAGNOSIS — Z79899 Other long term (current) drug therapy: Secondary | ICD-10-CM | POA: Diagnosis not present

## 2017-11-22 DIAGNOSIS — R202 Paresthesia of skin: Secondary | ICD-10-CM | POA: Insufficient documentation

## 2017-11-22 DIAGNOSIS — R251 Tremor, unspecified: Secondary | ICD-10-CM | POA: Diagnosis not present

## 2017-11-22 DIAGNOSIS — Z7982 Long term (current) use of aspirin: Secondary | ICD-10-CM | POA: Diagnosis not present

## 2017-11-22 DIAGNOSIS — D125 Benign neoplasm of sigmoid colon: Secondary | ICD-10-CM | POA: Diagnosis not present

## 2017-11-22 LAB — COMPREHENSIVE METABOLIC PANEL
ALT: 25 U/L (ref 0–44)
ANION GAP: 11 (ref 5–15)
AST: 32 U/L (ref 15–41)
Albumin: 4.9 g/dL (ref 3.5–5.0)
Alkaline Phosphatase: 63 U/L (ref 38–126)
BILIRUBIN TOTAL: 0.9 mg/dL (ref 0.3–1.2)
BUN: 35 mg/dL — ABNORMAL HIGH (ref 8–23)
CHLORIDE: 101 mmol/L (ref 98–111)
CO2: 25 mmol/L (ref 22–32)
Calcium: 10.4 mg/dL — ABNORMAL HIGH (ref 8.9–10.3)
Creatinine, Ser: 1.18 mg/dL (ref 0.61–1.24)
GFR calc Af Amer: >60 mL/min (ref >60)
Glucose, Bld: 80 mg/dL (ref 70–99)
POTASSIUM: 3.3 mmol/L — AB (ref 3.5–5.1)
Sodium: 137 mmol/L (ref 135–145)
TOTAL PROTEIN: 8 g/dL (ref 6.5–8.1)

## 2017-11-22 LAB — URINALYSIS, COMPLETE (UACMP) WITH MICROSCOPIC
Bacteria, UA: NONE SEEN
Bilirubin Urine: NEGATIVE
GLUCOSE, UA: NEGATIVE mg/dL
Hgb urine dipstick: NEGATIVE
Ketones, ur: 20 mg/dL — AB
Leukocytes, UA: NEGATIVE
Nitrite: NEGATIVE
PROTEIN: NEGATIVE mg/dL
Specific Gravity, Urine: 1.021 (ref 1.005–1.030)
Squamous Epithelial / LPF: NONE SEEN (ref 0–5)
pH: 7 (ref 5.0–8.0)

## 2017-11-22 LAB — CBC WITH DIFFERENTIAL/PLATELET
BASOS ABS: 0.1 10*3/uL (ref 0–0.1)
BASOS PCT: 1 %
EOS PCT: 1 %
Eosinophils Absolute: 0.1 10*3/uL (ref 0–0.7)
HEMATOCRIT: 44.5 % (ref 40.0–52.0)
Hemoglobin: 15.6 g/dL (ref 13.0–18.0)
LYMPHS PCT: 7 %
Lymphs Abs: 1 10*3/uL (ref 1.0–3.6)
MCH: 32.8 pg (ref 26.0–34.0)
MCHC: 35.1 g/dL (ref 32.0–36.0)
MCV: 93.4 fL (ref 80.0–100.0)
MONO ABS: 0.7 10*3/uL (ref 0.2–1.0)
Monocytes Relative: 5 %
NEUTROS ABS: 13.3 10*3/uL — AB (ref 1.4–6.5)
Neutrophils Relative %: 86 %
Platelets: 217 10*3/uL (ref 150–440)
RBC: 4.76 MIL/uL (ref 4.40–5.90)
RDW: 13 % (ref 11.5–14.5)
WBC: 15.3 10*3/uL — ABNORMAL HIGH (ref 3.8–10.6)

## 2017-11-22 LAB — TROPONIN I

## 2017-11-22 MED ORDER — GADOBENATE DIMEGLUMINE 529 MG/ML IV SOLN
15.0000 mL | Freq: Once | INTRAVENOUS | Status: AC | PRN
  Administered 2017-11-22: 15 mL via INTRAVENOUS

## 2017-11-22 MED ORDER — SODIUM CHLORIDE 0.9 % IV BOLUS
1000.0000 mL | Freq: Once | INTRAVENOUS | Status: AC
  Administered 2017-11-22: 1000 mL via INTRAVENOUS

## 2017-11-22 NOTE — ED Notes (Signed)
Patient has a complicated medical hx and states this afternoon he started shaking and hurting all over. Started in his head and moved down his neck and back. Patient states the pain comes in waves. Patient is in NAD. VS stable.

## 2017-11-22 NOTE — ED Notes (Signed)
Patient spoke to MRI and completed screening questions over phone.

## 2017-11-22 NOTE — ED Notes (Signed)
Patient to ED for shaking all over. Has noticeable shaking on the left side which has gotten better since EMS arrived and stated he was shaking all over. Complains of muscle pain and abdominal pain. Has known AAA at 4.1. VSS. Patient thinks the shaking is part of his normal chronic Parkinson's process.

## 2017-11-22 NOTE — ED Provider Notes (Signed)
[facility] Emergency Department Provider Note  Time seen: 8:50 PM  I have reviewed the triage vital signs and the nursing notes.   HISTORY  Chief Complaint Generalized Pain    HPI Adrian Zavala is a 67 y.o. male with a past medical history of arthritis, gastric reflux, hyperlipidemia, Wegener's granulomatosis, presents to the emergency department with various complaints of generalized pains across his body with tremor that started around 6:30 PM.  States he was shaking and could not stop shaking he then developed a sensation of flushing across his head with numbness and tingling sensation across the right side of his scalp and into his right arm.  States the tingling and numbness has resolved in the arm but he continues have a mild amount of tingling in his scalp on both sides now.  Denies any weakness.  Denies any fever.  Denies any cough, congestion abdominal pain, vomiting, diarrhea, dysuria.  Largely negative review of systems otherwise.   Past Medical History:  Diagnosis Date  . Arthritis    Spine, Left Hip, Hands  . 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[place])   . GERD (gastroesophageal reflux disease)   . Heart murmur   . Hypercholesteremia   . Hypothyroidism    graves  . Lung disorder    S/P chemo for Wegener's  . Neuromuscular disorder ([place])    some leg weakness S/P Chemo for Wegener's  . Osteopenia   . Osteoporosis    Femur  . Seasonal allergies   . Wegener's granulomatosis [facility])     Patient Active Problem List   Diagnosis Date Noted  . Elevated hemoglobin A1c 10/24/2017  . Osteoporosis 10/01/2016  . Irritable bowel syndrome (IBS) 10/01/2016  . Ascending aortic aneurysm ([place]) 10/01/2016  . Osteoarthritis of multiple joints 04/16/2016  . BPH (benign prostatic hyperplasia) 06/06/2015  . Muscle pain 06/06/2015  . Fatty liver disease, nonalcoholic 40/98/1191  . 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[place]) 05/09/2015  . Colitis 01/03/2015  . HLD (hyperlipidemia) 01/03/2015   . Essential hypertension 01/03/2015  . Eye inflamed 01/03/2015  . Hypothyroidism 01/03/2015  . Bicuspid aortic valve 01/03/2015  . Special screening for malignant neoplasms, colon   . Benign neoplasm of sigmoid colon   . First degree hemorrhoids   . Arthritis, degenerative 06/03/2013    Past Surgical History:  Procedure Laterality Date  . CHOLECYSTECTOMY     has "metal sutures"   . COLONOSCOPY N/A 10/25/2014   Procedure: COLONOSCOPY;  Surgeon: Lucilla Lame, MD;  Location: [place];  Service: Gastroenterology;  Laterality: N/A;  . TREATMENT FISTULA ANAL     closure with rectal advancement flap    Prior to Admission medications   Medication Sig Start Date End Date Taking? Authorizing Provider  acetaminophen (TYLENOL) 650 MG CR tablet Take 350 mg by mouth every 8 (eight) hours as needed for pain or fever.     [provider]  aspirin 81 MG tablet Take 81 mg by mouth daily. AM    [provider]  B Complex-C-Folic Acid (B-COMPLEX BALANCED PO) Take 1 tablet by mouth daily.    [provider]  calcium carbonate (OS-CAL) 600 MG TABS tablet Take 600 mg by mouth daily. AM    [provider]  ezetimibe (ZETIA) 10 MG tablet TAKE 1 TABLET BY MOUTH  DAILY 08/30/17   Minna Merritts, MD  folic acid (FOLVITE) 478 MCG tablet Take 400 mcg by mouth daily. AM    [provider]  levothyroxine (SYNTHROID, LEVOTHROID) 25 MCG tablet Take  1 tablet (25 mcg total) by mouth daily. 05/15/17   Karamalegos, Devonne Doughty, DO  loratadine (CLARITIN) 10 MG tablet Take 10 mg by mouth daily as needed for allergies.    [provider]  magnesium oxide (MAG-OX) 400 MG tablet Take 400 mg by mouth daily. 07/27/15   [provider]  meloxicam (MOBIC) 15 MG tablet TAKE 1 TABLET BY MOUTH  DAILY 05/06/17   Parks Ranger, Devonne Doughty, DO  methotrexate (RHEUMATREX) 2.5 MG tablet Take 15 mg by mouth once a week. Sat or Sun PM    [provider]  niacin  (NIASPAN) 500 MG CR tablet Take 750 mg by mouth at bedtime.    [provider]  predniSONE (DELTASONE) 5 MG tablet Take 5 mg by mouth daily.    [provider]  ranitidine (ZANTAC) 150 MG capsule TAKE 1 CAPSULE BY MOUTH  DAILY AS NEEDED FOR  HEARTBURN 11/06/17   Karamalegos, Devonne Doughty, DO  rosuvastatin (CRESTOR) 20 MG tablet Take 0.5 tablets (10 mg total) by mouth daily. 10/21/17   Karamalegos, Devonne Doughty, DO  rosuvastatin (CRESTOR) 20 MG tablet TAKE 1 TABLET BY MOUTH  DAILY 11/06/17   Minna Merritts, MD  traMADol (ULTRAM) 50 MG tablet TAKE 1 TABLET BY MOUTH EVERY 6 HOURS AS NEEDED FOR MODERATE PAIN. 10/21/17   Olin Hauser, DO  Vitamin D, Cholecalciferol, 400 UNITS CAPS Take 400 Units by mouth daily.    [provider]    Allergies  Allergen Reactions  . Sulfa Antibiotics Anaphylaxis  . Sulfamethoxazole-Trimethoprim Anaphylaxis  . Penicillins Other (See Comments)    Passes out  . Codeine Itching, Other (See Comments) and Rash    tingling Rash and dizziness  . Prilosec [Omeprazole] Hives and Rash  . Tetracycline Rash  . Tetracyclines & Related Rash    Family History  Family history unknown: Yes    Social History Social History   Tobacco Use  . Smoking status: Former Smoker    Last attempt to quit: 08/24/1996    Years since quitting: 21.2  . Smokeless tobacco: Never Used  Substance Use Topics  . Alcohol use: Yes    Alcohol/week: 3.0 standard drinks    Types: 3 Cans of beer per week  . Drug use: No    Review of Systems Constitutional: Negative for fever.  Positive for generalized pain and tremor, now resolved.  Flush feeling across face, now resolved Cardiovascular: Negative for chest pain. Respiratory: Negative for shortness of breath. Gastrointestinal: Negative for abdominal pain, vomiting and diarrhea. Genitourinary: Negative for urinary compaints Musculoskeletal: Negative for musculoskeletal complaints Skin: Negative for skin  complaints  Neurological: Negative for headache.  Tingling and numbness in the right scalp and right hand/upper extremity All other ROS negative  ____________________________________________   PHYSICAL EXAM:  VITAL SIGNS: ED Triage Vitals  Enc Vitals Group     BP 11/22/17 2005 (!) 165/88     Pulse Rate 11/22/17 2005 (!) 101     Resp 11/22/17 2005 15     Temp --      Temp src --      SpO2 11/22/17 2005 98 %     Weight 11/22/17 2007 177 lb (80.3 kg)     Height 11/22/17 2007 5' 6"  (1.676 m)     Head Circumference --      Peak Flow --      Pain Score 11/22/17 2008 2     Pain Loc --      Pain Edu? --  Excl. in [place]? --    Constitutional: Alert and oriented. Well appearing and in no distress. Eyes: Normal exam ENT   Head: Normocephalic and atraumatic.   Mouth/Throat: Mucous membranes are moist. Cardiovascular: Normal rate, regular rhythm. No murmur Respiratory: Normal respiratory effort without tachypnea nor retractions. Breath sounds are clear Gastrointestinal: Soft and nontender. No distention.  Musculoskeletal: Nontender with normal range of motion in all extremities.  Neurologic:  Normal speech and language. No gross focal neurologic deficits.  Equal grip strength bilaterally.  No pronator drift, no lower extremity weakness, 5/5 motor in all extremities.  No cranial nerve deficits.  Sensation equal and intact bilaterally. Skin:  Skin is warm, dry and intact.  Psychiatric: Mood and affect are normal. Speech and behavior are normal.   ____________________________________________    EKG  EKG reviewed and interpreted by myself shows normal sinus rhythm at 97 bpm with a narrow QRS, normal axis, normal intervals, no ST changes.  ____________________________________________    RADIOLOGY  MRIs have resulted showing no acute abnormality within the head.  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  Moderate stenosis of the right vertebral artery  but no occlusion.  ____________________________________________   INITIAL IMPRESSION / ASSESSMENT AND PLAN / ED COURSE  Pertinent labs & imaging results that were available during my care of the patient were reviewed by me and considered in my medical decision making (see chart for details).  Patient presents to the emergency department with symptoms of generalized pain shaking and flushing across his body starting at 6:30 PM.  Also states tremor that he could not control, then developed tingling and numbness across his scalp more so on the right side and into his right upper extremity and right hand.  States those symptoms have largely resolved besides mild tingling across the top of his scalp.  Given the patient's history of Wegener's and possible vasculitis this could lead to CVA of the symptoms are largely resolved at this time, and CVA would not necessarily explain the patient's other symptoms.  We will attempt to obtain an MRI if the patient is able to further evaluate including MR a of the head and neck.  We will also check labs, IV hydrate and continue to closely monitor.  Overall the patient appears very well with a normal neurological exam currently.  MRIs did not show a clear cause of the patient's symptoms.  I did discuss results including 2 mm anterior communicating artery and the need to follow-up with his PCP not emergently for further work-up.  Patient is feeling better.  His lab work has resulted largely within normal limits besides mild leukocytosis but the patient takes daily prednisone.  Patient is feeling better we will discharge home with PCP follow-up. ____________________________________________   FINAL CLINICAL IMPRESSION(S) / ED DIAGNOSES  Paresthesias Tremor     Harvest Dark, MD 11/22/17 2326

## 2017-11-22 NOTE — ED Notes (Signed)
Patient went to MRI.

## 2017-12-16 ENCOUNTER — Ambulatory Visit (INDEPENDENT_AMBULATORY_CARE_PROVIDER_SITE_OTHER): Payer: Managed Care, Other (non HMO) | Admitting: Family Medicine

## 2017-12-16 ENCOUNTER — Encounter: Payer: Self-pay | Admitting: Family Medicine

## 2017-12-16 ENCOUNTER — Other Ambulatory Visit: Payer: Self-pay | Admitting: Family Medicine

## 2017-12-16 VITALS — BP 134/67 | HR 75 | Temp 98.5°F | Resp 16 | Ht 65.0 in | Wt 182.0 lb

## 2017-12-16 DIAGNOSIS — R197 Diarrhea, unspecified: Secondary | ICD-10-CM

## 2017-12-16 DIAGNOSIS — E876 Hypokalemia: Secondary | ICD-10-CM | POA: Diagnosis not present

## 2017-12-16 DIAGNOSIS — Z23 Encounter for immunization: Secondary | ICD-10-CM | POA: Diagnosis not present

## 2017-12-16 DIAGNOSIS — R7309 Other abnormal glucose: Secondary | ICD-10-CM

## 2017-12-16 DIAGNOSIS — R251 Tremor, unspecified: Secondary | ICD-10-CM | POA: Diagnosis not present

## 2017-12-16 NOTE — Patient Instructions (Addendum)
Thank you for coming to the office today.  Best guess I have is likely related to some dehydration with low sodium. Also had Low Potassium may have contributed to tremors. Cannot explain all symptoms. Overall testing with MRI imaging is reassuring, some mild abnormalities that I do not think are of significant concern at this time. We can monitor this in future.  Follow-up as needed or return to hospital if severe reaction again.  Regular dose Flu shot today  Please schedule a Follow-up Appointment to: Return in about 19 weeks (around 04/28/2018), or if symptoms worsen or fail to improve, for keep apt as scheduled.  If you have any other questions or concerns, please feel free to call the office or send a message through [place]. You may also schedule an earlier appointment if necessary.  Additionally, you may be receiving a survey about your experience at our office within a few days to 1 week by e-mail or mail. We value your feedback.  [name], DO [facility]

## 2017-12-16 NOTE — Progress Notes (Signed)
Subjective:    Patient ID: Adrian Zavala, male    DOB: 29-Jan-1951, 67 y.o.   MRN: [id number]  Adrian Zavala is a 67 y.o. male presenting on 12/16/2017 for Hospitalization Follow-up (Paresthesias improved)   HPI   ED FOLLOW-UP VISIT  Hospital/Location: [facility] Date of ED Visit: 11/22/17  Reason for Presenting to ED: Tremors / Constellation of symptoms Primary (+Secondary) Diagnosis: Paresthesias / Allergic Reaction?  FOLLOW-UP - ED provider note and record have been reviewed - Patient presents today about 24 days after recent ED visit. Brief summary of recent course, patient had symptoms of feeling ill and sick at work, nauseas and some abdominal pain, he left work and went home, and symptoms were getting worse "felt bad head to toe" felt nauseas, and neck pain. He developed whole body tremors as described worse left arm, he lost some sensation and feeling in scalp and fingers, also felt "skin was on fire" with "burning sensation". He went to ED via EMS, his wife called ambulance and other local first responders neighbors came quickly to check on him. 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 He was admitted to hospital at that time with similar symptoms with tremors, hyperventilating, tingling arms, tachycardic. He had very low K, he was given treatment to adjust heart rate, transferred to ICU. He describes that these past events were very similar to what happened this time.  Back to current  episode, he states upon returning home he felt tired and slept for long time. He did have several episodes of diarrhea and loose and then eventually watery stools multiple times.  He denies any new medications tried. No antibiotic recently. No other significant herbal or OTC. He does take a MVI. He is unsure potential trigger to explain this occurrence.  Only factor he thought of he [place] and his wife both ate same food. She did not get sick.  - Today reports overall has done well after discharge from ED. Symptoms have resolved now. He did have persistent loose stool and diarrhea for few days to week and these have then since resolved.  - New medications on discharge: None - Changes to current meds on discharge: None  Denies any tremors, weakness, tingling, headache, chest pain, diarrhea, abdominal pain, rash, fevers chills  HM  Due for Flu Shot, will receive today    [facility] 12/16/2017 10/21/2017 04/16/2016  Decreased Interest 0 0 0  Down, Depressed, Hopeless 0 0 0  PHQ - 2 Score 0 0 0    Social History   Tobacco Use  . Smoking status: Former Smoker    Last attempt to quit: 08/24/1996    Years since quitting: 21.3  . Smokeless tobacco: Former UNetwork engineerUse Topics  . Alcohol use: Yes    Alcohol/week: 3.0 standard drinks    Types: 3 Cans of beer per week  . Drug use: No    Review of Systems Per HPI unless specifically indicated above     Objective:  BP 134/67   Pulse 75   Temp 98.5 F (36.9 C) (Oral)   Resp 16   Ht 5' 5"  (1.651 m)   Wt 182 lb (82.6 kg)   BMI 30.29 kg/m   Wt Readings from Last 3 Encounters:  12/16/17 182 lb (82.6 kg)  11/22/17 177 lb (80.3 kg)  10/21/17 185 lb (83.9 kg)    Physical Exam  Constitutional: He is oriented to person, place, and time. He appears well-developed and well-nourished. No distress.  Well-appearing, comfortable, cooperative  HENT:  Head: Normocephalic and  atraumatic.  Mouth/Throat: Oropharynx is clear and moist.  Eyes: Conjunctivae are normal. Right eye exhibits no discharge. Left eye exhibits no discharge.  Neck: Normal range of motion. Neck supple. No thyromegaly present.  Cardiovascular: Normal rate, regular rhythm, normal heart sounds and intact distal pulses.  No murmur heard. Pulmonary/Chest: Effort normal and breath sounds normal. No respiratory distress. He has no wheezes. He has no rales.  Musculoskeletal: Normal range of motion. He exhibits no edema.  Lymphadenopathy:    He has no cervical adenopathy.  Neurological: He is alert and oriented to person, place, and time.  Skin: Skin is warm and dry. No rash noted. He is not diaphoretic. No erythema.  Psychiatric: He has a normal mood and affect. His behavior is normal.  Well groomed, good eye contact, normal speech and thoughts  Nursing note and vitals reviewed.    I have personally reviewed the radiology report from MR Brain w and wo contrast / MR Angiogram Neck w and wo  CLINICAL DATA: Shaking, hot flashes, RIGHT arm numbness. History of Wegener's, Cushing syndrome.  EXAM: MRI HEAD WITHOUT AND WITH CONTRAST  MRA HEAD WITHOUT CONTRAST  MRA NECK WITHOUT AND WITH CONTRAST  TECHNIQUE: Multiplanar, multiecho pulse sequences of the brain and surrounding structures were obtained with and without intravenous contrast. Angiographic images of the Circle of Willis were obtained using MRA technique without intravenous contrast. Angiographic images of the neck were obtained using MRA technique without and with intravenous contrast. Carotid stenosis measurements (when applicable) are obtained utilizing NASCET criteria, using the distal internal carotid diameter as the denominator.  CONTRAST: 64m MULTIHANCE GADOBENATE DIMEGLUMINE 529 MG/ML IV SOLN  COMPARISON: CT HEAD March 05, 2013  FINDINGS: MRI HEAD FINDINGS  INTRACRANIAL CONTENTS: No reduced diffusion to suggest  acute ischemia. No susceptibility artifact to suggest hemorrhage. The ventricles and sulci are normal for patient's age. Patchy supratentorial white matter FLAIR T2 hyperintensities. Punctate pontine T2 hyperintensities. No suspicious parenchymal signal, masses, mass effect. No abnormal intraparenchymal or extra-axial enhancement. No abnormal extra-axial fluid collections. No extra-axial masses.  VASCULAR: Normal major intracranial vascular flow voids present at skull base.  SKULL AND UPPER CERVICAL SPINE: No abnormal sellar expansion. No suspicious calvarial bone marrow signal. Craniocervical junction maintained.  SINUSES/ORBITS: Mild paranasal sinus mucosal thickening without air-fluid levels. Mastoid air cells are well aerated.The included ocular globes and orbital contents are non-suspicious.  OTHER: None.  MRA HEAD FINDINGS  ANTERIOR CIRCULATION: Normal flow related enhancement of the included cervical, petrous, cavernous and supraclinoid internal carotid arteries. RIGHT ophthalmic artery infundibulum. 2 mm inferiorly directed outpouching RIGHT A1 2 junction. Patent anterior communicating artery. Patent anterior and middle cerebral arteries.  No large vessel occlusion, flow limiting stenosis, aneurysm.  POSTERIOR CIRCULATION: LEFT vertebral artery is dominant. Vertebrobasilar arteries are patent, with normal flow related enhancement of the main branch vessels. RIGHT vertebral artery terminates in the posterior inferior cerebellar artery. Patent posterior cerebral arteries. LEFT posterior communicating artery  present.  No large vessel occlusion, flow limiting stenosis, aneurysm.  ANATOMIC VARIANTS: None.  Source images and MIP images were reviewed.  MRA NECK FINDINGS  ANTERIOR CIRCULATION: The common carotid arteries are widely patent bilaterally. The carotid bifurcations are patent bilaterally without hemodynamically significant stenosis by NASCET criteria. No  flow limiting stenosis or luminal irregularity.  POSTERIOR CIRCULATION: Bilateral vertebral arteries are patent to the vertebrobasilar junction. Moderate stenosis RIGHT vertebral artery origin. No flow limiting stenosis or luminal irregularity.  Source images and MIP images were reviewed.  IMPRESSION: MRI HEAD:  1. No acute intracranial process. 2. Mild-to-moderate chronic small vessel ischemic changes.  MRA HEAD:  1. No emergent large vessel occlusion or flow-limiting stenosis. 2. 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  MRA NECK:  1. No hemodynamically significant stenosis ICA. 2. Moderate stenosis RIGHT vertebral artery origin.   Electronically Signed By: Elon Alas M.D. On: 11/22/2017 23:09    Results for orders placed or performed during the hospital encounter of 11/22/17  CBC with Differential  Result Value Ref Range   WBC 15.3 (H) 3.8 - 10.6 K/uL   RBC 4.76 4.40 - 5.90 MIL/uL   Hemoglobin 15.6 13.0 - 18.0 g/dL   HCT 44.5 40.0 - 52.0 %   MCV 93.4 80.0 - 100.0 fL   MCH 32.8 26.0 - 34.0 pg   MCHC 35.1 32.0 - 36.0 g/dL   RDW 13.0 11.5 - 14.5 %   Platelets 217 150 - 440 K/uL   Neutrophils Relative % 86 %   Neutro Abs 13.3 (H) 1.4 - 6.5 K/uL   Lymphocytes Relative 7 %   Lymphs Abs 1.0 1.0 - 3.6 K/uL   Monocytes Relative 5 %   Monocytes Absolute 0.7 0.2 - 1.0 K/uL   Eosinophils Relative 1 %   Eosinophils Absolute 0.1 0 - 0.7 K/uL   Basophils Relative 1 %   Basophils Absolute 0.1 0 - 0.1 K/uL  Comprehensive metabolic panel  Result Value Ref Range   Sodium 137 135 - 145 mmol/L   Potassium 3.3 (L) 3.5 - 5.1 mmol/L   Chloride 101 98 - 111 mmol/L   CO2 25 22 - 32 mmol/L   Glucose, Bld 80 70 - 99 mg/dL   BUN 35 (H) 8 - 23 mg/dL   Creatinine, Ser 1.18 0.61 - 1.24 mg/dL   Calcium 10.4 (H) 8.9 - 10.3 mg/dL   Total Protein 8.0 6.5 - 8.1 g/dL   Albumin 4.9 3.5 - 5.0 g/dL   AST 32 15 - 41 U/L   ALT 25 0 - 44 U/L    Alkaline Phosphatase 63 38 - 126 U/L   Total Bilirubin 0.9 0.3 - 1.2 mg/dL   GFR calc non Af Amer >60 >60 mL/min   GFR calc Af Amer >60 >60 mL/min   Anion gap 11 5 - 15  Urinalysis, Complete w Microscopic  Result Value Ref Range   Color, Urine YELLOW (A) YELLOW   APPearance HAZY (A) CLEAR   Specific Gravity, Urine 1.021 1.005 - 1.030   pH 7.0 5.0 - 8.0   Glucose, UA NEGATIVE NEGATIVE mg/dL   Hgb urine dipstick NEGATIVE NEGATIVE   Bilirubin Urine NEGATIVE NEGATIVE   Ketones, ur 20 (A) NEGATIVE mg/dL   Protein, ur NEGATIVE NEGATIVE mg/dL   Nitrite NEGATIVE NEGATIVE   Leukocytes, UA NEGATIVE NEGATIVE   RBC / HPF 0-5 0 - 5 RBC/hpf   WBC, UA 0-5 0 - 5 WBC/hpf  Bacteria, UA NONE SEEN NONE SEEN   Squamous Epithelial / LPF NONE SEEN 0 - 5  Troponin I  Result Value Ref Range   Troponin I <0.03 <0.03 ng/mL      Assessment & Plan:   Problem List Items Addressed This Visit    None    Visit Diagnoses    Hypokalemia    -  Primary   Tremors of nervous system       Diarrhea, unspecified type       Needs flu shot       Relevant Orders   Flu Vaccine QUAD 36+ mos IM (Completed)      Clinically uncertain exact etiology for his constellation of symptoms, suspected combination of ?food poisoning, dehydration from sodium, GI losses. Seems to have caused constellation of symptoms. Similar to prior complicated episode years ago due to bactrim reaction. Now without known trigger or med change. - Work-up overall showed some abnormal labs, as reviewed - MRI Brain / MRA Neck - showed minimal aneurysm, seems incidental finding, and some mild-moderate stenosis R side  Plan Reassurance, no clear etiology of constellation of symptoms that has since resolved Return criteria given, follow-up as planned  No orders of the defined types were placed in this encounter.  FOR A&P I have reviewed the discharge medication list, and have reconciled the current and discharge medications today.   Current  Outpatient Medications:  .  acetaminophen (TYLENOL) 650 MG CR tablet, Take 350 mg by mouth every 8 (eight) hours as needed for pain or fever. , Disp: , Rfl:  .  aspirin 81 MG tablet, Take 81 mg by mouth daily. AM, Disp: , Rfl:  .  B Complex-C-Folic Acid (B-COMPLEX BALANCED PO), Take 1 tablet by mouth daily., Disp: , Rfl:  .  calcium carbonate (OS-CAL) 600 MG TABS tablet, Take 600 mg by mouth daily. AM, Disp: , Rfl:  .  ezetimibe (ZETIA) 10 MG tablet, TAKE 1 TABLET BY MOUTH  DAILY, Disp: 90 tablet, Rfl: 3 .  folic acid (FOLVITE) 413 MCG tablet, Take 400 mcg by mouth daily. AM, Disp: , Rfl:  .  ibandronate (BONIVA) 150 MG tablet, Take by mouth., Disp: , Rfl:  .  levothyroxine (SYNTHROID, LEVOTHROID) 25 MCG tablet, Take 1 tablet (25 mcg total) by mouth daily., Disp: 90 tablet, Rfl: 3 .  loratadine (CLARITIN) 10 MG tablet, Take 10 mg by mouth daily as needed for allergies., Disp: , Rfl:  .  magnesium oxide (MAG-OX) 400 MG tablet, Take 400 mg by mouth daily., Disp: , Rfl:  .  meloxicam (MOBIC) 15 MG tablet, TAKE 1 TABLET BY MOUTH  DAILY, Disp: 90 tablet, Rfl: 2 .  methotrexate (RHEUMATREX) 2.5 MG tablet, Take 15 mg by mouth once a week. Sat or Sun PM, Disp: , Rfl:  .  niacin (NIASPAN) 500 MG CR tablet, Take 750 mg by mouth at bedtime., Disp: , Rfl:  .  predniSONE (DELTASONE) 5 MG tablet, Take 5 mg by mouth daily., Disp: , Rfl:  .  ranitidine (ZANTAC) 150 MG capsule, TAKE 1 CAPSULE BY MOUTH  DAILY AS NEEDED FOR  HEARTBURN, Disp: 90 capsule, Rfl: 3 .  rosuvastatin (CRESTOR) 20 MG tablet, Take 0.5 tablets (10 mg total) by mouth daily., Disp: 45 tablet, Rfl: 0 .  traMADol (ULTRAM) 50 MG tablet, TAKE 1 TABLET BY MOUTH EVERY 6 HOURS AS NEEDED FOR MODERATE PAIN., Disp: 120 tablet, Rfl: 2 .  Vitamin D, Cholecalciferol, 400 UNITS CAPS, Take 400 Units by mouth  daily., Disp: , Rfl:    Follow up plan: Return in about 19 weeks (around 04/28/2018), or if symptoms worsen or fail to improve, for keep apt as  scheduled.  [name], [facility] 12/16/2017, 2:07 PM

## 2018-02-10 ENCOUNTER — Ambulatory Visit (INDEPENDENT_AMBULATORY_CARE_PROVIDER_SITE_OTHER): Payer: Managed Care, Other (non HMO) | Admitting: Family Medicine

## 2018-02-10 ENCOUNTER — Encounter: Payer: Self-pay | Admitting: Family Medicine

## 2018-02-10 VITALS — BP 148/71 | HR 71 | Temp 98.4°F | Resp 16 | Ht 65.0 in | Wt 180.0 lb

## 2018-02-10 DIAGNOSIS — S61210A Laceration without foreign body of right index finger without damage to nail, initial encounter: Secondary | ICD-10-CM

## 2018-02-10 DIAGNOSIS — Z4802 Encounter for removal of sutures: Secondary | ICD-10-CM

## 2018-02-10 NOTE — Progress Notes (Signed)
Subjective:    Patient ID: Adrian Zavala, male    DOB: 1950/05/12, 67 y.o.   MRN: [id number]  Adrian Zavala is a 67 y.o. male presenting on 02/10/2018 for Suture / Staple Removal (3 stitches ) and Laceration to Right Index finger   HPI   Right Index Finger Laceration - Suture removal follow-up Here today for evaluation and suture removal, he had accidental knife injury on 02/01/18 - with laceration to tip of R index finger, he was out of state deer hunting in [place] over weekend, he states that while cleaning the deer meat he accidentally cut into the tip of his R index finger, had pain and bleeding, he went to [facility] in [place] and received 3 simple interrupted suture in tip of his finger, and asked to have them removed after 10-14 days. - he has done well since sutures - he uses peroxide and alcohol rinse, soap and water, and has not had significant bleeding - Admits pain if touched on this spot too hard, or pressure - Admits some tingling at times, but still has sensation - Denies any drainage of pus, bleeding, fever chills, redness, swelling other injury  Health Maintenance: UTD TDap 10/01/16  Depression screen [facility] 2/9 02/10/2018 12/16/2017 10/21/2017  Decreased Interest 0 0 0  Down, Depressed, Hopeless 0 0 0  PHQ - 2 Score 0 0 0    Social History   Tobacco Use  . Smoking status: Former Smoker    Last attempt to quit: 08/24/1996    Years since quitting: 21.4  . Smokeless tobacco: Former Network engineer Use Topics  . Alcohol use: Yes    Alcohol/week: 3.0 standard drinks    Types: 3 Cans of beer per week  . Drug use: No    Review of Systems Per HPI unless specifically indicated above     Objective:    BP (!) 148/71   Pulse 71   Temp 98.4 F (36.9 C) (Oral)   Resp 16   Ht 5' 5"  (1.651 m)   Wt 180 lb (81.6 kg)   BMI 29.95 kg/m   Wt Readings from Last 3 Encounters:  02/10/18 180 lb (81.6 kg)  12/16/17 182 lb (82.6 kg)  11/22/17 177 lb (80.3 kg)      Physical Exam  Constitutional: He appears well-developed and well-nourished. No distress.  Musculoskeletal:  Distal muscle strength intact fingers, grip  Neurological: He is alert.  Distal sensation to light touch intact, including R index finger  Skin: Skin is warm and dry. He is not diaphoretic.  R index finger tip has small curved laceration to pulp of finger with 3 simple interrupted sutures in place with notable amount of firm dry scab tissue surrounding, wound is approximated.  Nursing note and vitals reviewed.    ________________________________________________________ PROCEDURE NOTE Date - 02/10/18 Suture Removal - R Index Finger tip location - Location / Date Sutures were placed: 02/01/18 - # of Sutures: 3 - simple interrupted Discussed benefits and risks (including pain, bleeding, infection, wound separation). Verbal consent given by patient Medication: None / except cold spray Time Out taken Examination of the sutured laceration on R index finger tip appears to be well healed. Area cleansed with alcohol wipes, sterile saline rinse and hydrogen peroxide to break up scab. Appropriate # of sutures counted and confirmed. Removal of sutures one by one using sterile pick ups to adjust and sterile scissors to cut one side of suture away from knot.  - 2 out  of 3 sutures were successfully removed, remaining 1 (ONE) middle suture was left in place due to concern about integrity of the wound and recommend further healing before removal. See A&P.   BEFORE any suture removal today:      AFTER 2 suture removal:       Assessment & Plan:   Problem List Items Addressed This Visit    None    Visit Diagnoses    Laceration of right index finger without foreign body without damage to nail, initial encounter    -  Primary   Visit for suture removal          Healing laceration to R index finger now 9 days after injury, seems uncomplicated, sensation intact no sign of secondary  infection, wound has been maintained. - Removal of 2 out of 3 sutures today, decided to leave middle suture in place to hold wound for approximation, after had some bleeding with one removal  Maintain wound hygiene as he is, may use topical alcohol / peroxide for scabs, and can do warm water rinse , avoid submerging and soaking  May use topical antibiotic ointment to avoid worse scabbing  Follow-up in 3-4 days for final suture removal  No orders of the defined types were placed in this encounter.   Follow up plan: Return in about 3 days (around 02/13/2018) for suture removal - finger.  Adrian Putnam, DO [facility] 02/10/2018, 10:06 AM

## 2018-02-10 NOTE — Patient Instructions (Addendum)
Thank you for coming to the office today.  Removed 2 of 3 sutures - last one is not ready. Return in 3-4 days.  Try to use peroxide, running water gently remove some more scab  Follow-up sooner if needed.  Please schedule a Follow-up Appointment to: Return in about 3 days (around 02/13/2018) for suture removal - finger.  If you have any other questions or concerns, please feel free to call the office or send a message through [place]. You may also schedule an earlier appointment if necessary.  Additionally, you may be receiving a survey about your experience at our office within a few days to 1 week by e-mail or mail. We value your feedback.  [name], DO [place]

## 2018-02-13 ENCOUNTER — Ambulatory Visit (INDEPENDENT_AMBULATORY_CARE_PROVIDER_SITE_OTHER): Payer: Managed Care, Other (non HMO) | Admitting: Family Medicine

## 2018-02-13 ENCOUNTER — Encounter: Payer: Self-pay | Admitting: Family Medicine

## 2018-02-13 VITALS — BP 135/59 | HR 71 | Temp 98.0°F | Resp 16 | Ht 65.0 in | Wt 180.0 lb

## 2018-02-13 DIAGNOSIS — S61210A Laceration without foreign body of right index finger without damage to nail, initial encounter: Secondary | ICD-10-CM | POA: Diagnosis not present

## 2018-02-13 DIAGNOSIS — Z4802 Encounter for removal of sutures: Secondary | ICD-10-CM | POA: Diagnosis not present

## 2018-02-13 NOTE — Patient Instructions (Signed)
Thank you for coming to the office today.  All sutures removed. Wound care instructions as discussed today. Follow-up as planned.  Please schedule a Follow-up Appointment to: No follow-ups on file.  If you have any other questions or concerns, please feel free to call the office or send a message through [place]. You may also schedule an earlier appointment if necessary.  Additionally, you may be receiving a survey about your experience at our office within a few days to 1 week by e-mail or mail. We value your feedback.  [name], DO [place]

## 2018-02-13 NOTE — Progress Notes (Signed)
Subjective:    Patient ID: Adrian Zavala, male    DOB: Sep 30, 1950, 67 y.o.   MRN: [id number]  Adrian Zavala is a 67 y.o. male presenting on 02/13/2018 for Suture / Staple Removal and Finger laceration   HPI  FOLLOW-UP Right Index Finger Laceration - Suture removal - Last visit with me 02/10/18, for initial visit for same problem, treated with removal of 2 out of 3 sutures in finger, cleansed laceration and given wound care instructions, see prior notes for background information. - Interval update with still improving finger laceration, still 1 suture remains, he was asked to return to remove it - Today patient reports improved pain, he has improved sensation in finger tip - He has continued to clean area and remove some scab. Using antibiotic ointment. - Admits very rare episode of slight bleeding but no further issues since - Admits pain if touched on this spot too hard, or pressure - Denies any drainage of pus, bleeding, fever chills, redness, swelling other injury  Health Maintenance: UTD TDap 10/01/16  Depression screen [facility] 2/9 02/13/2018 02/10/2018 12/16/2017  Decreased Interest 0 0 0  Down, Depressed, Hopeless 0 0 0  PHQ - 2 Score 0 0 0    Social History   Tobacco Use  . Smoking status: Former Smoker    Last attempt to quit: 08/24/1996    Years since quitting: 21.4  . Smokeless tobacco: Former Network engineer Use Topics  . Alcohol use: Yes    Alcohol/week: 3.0 standard drinks    Types: 3 Cans of beer per week  . Drug use: No    Review of Systems Per HPI unless specifically indicated above     Objective:    BP (!) 135/59   Pulse 71   Temp 98 F (36.7 C) (Oral)   Resp 16   Ht 5' 5"  (1.651 m)   Wt 180 lb (81.6 kg)   BMI 29.95 kg/m   Wt Readings from Last 3 Encounters:  02/13/18 180 lb (81.6 kg)  02/10/18 180 lb (81.6 kg)  12/16/17 182 lb (82.6 kg)    Physical Exam  Constitutional: He appears well-developed and well-nourished. No distress.    Musculoskeletal:  Distal muscle strength intact fingers, grip  Neurological: He is alert.  Distal sensation to light touch intact, including R index finger  Skin: Skin is warm and dry. He is not diaphoretic.  R index finger tip has small curved laceration to pulp of finger with 1 remaining simple interrupted suture in place, improved reduced amount of dry scab. Fair wound approximation.  Nursing note and vitals reviewed.  ________________________________________________________ PROCEDURE NOTE Date - 02/13/18 Suture Removal - R Index Finger tip location - Location / Date Sutures were placed: 02/01/18 - REMAINING # of Sutures: 1 - simple interrupted Discussed benefits and risks (including pain, bleeding, infection, wound separation). Verbal consent given by patient Medication: None / except cold spray Time Out taken Examination of the sutured laceration on R index finger tip appears to be well healed. Area cleansed with alcohol wipes. Appropriate # of sutures counted and confirmed. Removal of single suture one by one using sterile pick ups to adjust and sterile scissors to cut one side of suture away from knot.  - 1 out of 1 sutures were successfully removed - Cleansed with betadine and wiped with alcohol pad. Steri-strip applied to help approximate laceration.     Assessment & Plan:   Problem List Items Addressed This Visit    None  Visit Diagnoses    Laceration of right index finger without foreign body without damage to nail, initial encounter    -  Primary   Visit for suture removal          No orders of the defined types were placed in this encounter.  Continued healing laceration to R index finger now 12 days after injury, uncomplicated, sensation intact no sign of secondary infection, wound has been maintained. - Removal of 1 out of 1 remaining sutures today  Wound care instructions given.  May use topical antibiotic ointment to avoid worse scabbing  Use steri  strips to approximate laceration to promote healing.  Follow up plan: Return if symptoms worsen or fail to improve.  Adrian Putnam, DO [facility] 02/13/2018, 11:51 AM

## 2018-03-24 ENCOUNTER — Telehealth: Payer: Self-pay | Admitting: Family Medicine

## 2018-03-24 ENCOUNTER — Telehealth: Payer: Self-pay

## 2018-03-24 DIAGNOSIS — K219 Gastro-esophageal reflux disease without esophagitis: Secondary | ICD-10-CM

## 2018-03-24 DIAGNOSIS — J0100 Acute maxillary sinusitis, unspecified: Secondary | ICD-10-CM

## 2018-03-24 MED ORDER — AZITHROMYCIN 250 MG PO TABS: ORAL_TABLET | ORAL | 0 refills | Status: DC

## 2018-03-24 MED ORDER — FAMOTIDINE 40 MG PO TABS: 40.0000 mg | ORAL_TABLET | Freq: Every day | ORAL | 1 refills | Status: DC

## 2018-03-24 NOTE — Telephone Encounter (Signed)
Patient has nasal and chest congestion yellowish mucus onset week denies chills or fever. ranitidine has been recall patient wants different med's for acid reflux. Please suggest ?

## 2018-03-24 NOTE — Telephone Encounter (Signed)
Called patient to triage further, he reports symptoms persistent >1 week, sinus related primarily with congestion related and some sinus pain, he says somewhat improved in 24 hours, using Afrin nasal PRN. He cannot get in to be seen since I am not in office tomorrow or New years day 03/26/18. I advised given his other medical conditions goal to avoid secondary bacterial infection - will send Azithromycin z-pak to pharmacy today, follow-up if any worsening or new concerns, as advised. In future needs apt - given holiday will agree to send medicine today.  DC Ranitidine 138m daily PRN due to recall. Start Famotidine 432mdaily PRN instead, rx sent  [name] 03/24/2018, 12:57 PM

## 2018-03-24 NOTE — Telephone Encounter (Signed)
Patient called wanting to be seen for a sinus infection.  Congestion, fatigue and pain for 1 week .  Please advise this patient

## 2018-03-24 NOTE — Telephone Encounter (Signed)
[facility] in [place] is out of pepcid and pt asked if prescription could be sent to [place].  Pt's call back number is [phone]

## 2018-03-25 MED ORDER — FAMOTIDINE 40 MG PO TABS: 40.0000 mg | ORAL_TABLET | Freq: Every day | ORAL | 1 refills | Status: DC

## 2018-03-25 NOTE — Telephone Encounter (Signed)
Patient notified and he is feeling much better today.

## 2018-04-17 ENCOUNTER — Other Ambulatory Visit: Payer: Self-pay | Admitting: Family Medicine

## 2018-04-17 DIAGNOSIS — K219 Gastro-esophageal reflux disease without esophagitis: Secondary | ICD-10-CM

## 2018-04-17 MED ORDER — FAMOTIDINE 40 MG PO TABS: 40.0000 mg | ORAL_TABLET | Freq: Every day | ORAL | 0 refills | Status: DC

## 2018-04-21 ENCOUNTER — Other Ambulatory Visit: Payer: Self-pay | Admitting: Family Medicine

## 2018-04-21 DIAGNOSIS — M1990 Unspecified osteoarthritis, unspecified site: Secondary | ICD-10-CM

## 2018-04-23 ENCOUNTER — Ambulatory Visit: Payer: Managed Care, Other (non HMO) | Admitting: Family Medicine

## 2018-04-23 ENCOUNTER — Encounter: Payer: Self-pay | Admitting: Family Medicine

## 2018-04-23 ENCOUNTER — Ambulatory Visit
Admission: RE | Admit: 2018-04-23 | Discharge: 2018-04-23 | Disposition: A | Discharge status: Home / Self Care | Payer: Managed Care, Other (non HMO) | Source: Ambulatory Visit | Attending: Family Medicine | Admitting: Family Medicine

## 2018-04-23 VITALS — BP 162/81 | HR 81 | Temp 98.4°F | Resp 16 | Ht 65.0 in | Wt 185.0 lb

## 2018-04-23 DIAGNOSIS — R0602 Shortness of breath: Secondary | ICD-10-CM | POA: Diagnosis not present

## 2018-04-23 DIAGNOSIS — R05 Cough: Secondary | ICD-10-CM | POA: Insufficient documentation

## 2018-04-23 DIAGNOSIS — J0100 Acute maxillary sinusitis, unspecified: Secondary | ICD-10-CM | POA: Diagnosis not present

## 2018-04-23 DIAGNOSIS — R059 Cough, unspecified: Secondary | ICD-10-CM

## 2018-04-23 MED ORDER — LEVOFLOXACIN 500 MG PO TABS: 500.0000 mg | ORAL_TABLET | Freq: Every day | ORAL | 0 refills | Status: DC

## 2018-04-23 MED ORDER — IPRATROPIUM BROMIDE 0.06 % NA SOLN: 2.0000 | Freq: Four times a day (QID) | NASAL | 0 refills | Status: DC

## 2018-04-23 MED ORDER — BENZONATATE 100 MG PO CAPS: 100.0000 mg | ORAL_CAPSULE | Freq: Three times a day (TID) | ORAL | 0 refills | Status: DC | PRN

## 2018-04-23 NOTE — Progress Notes (Signed)
Subjective:    Patient ID: Adrian Zavala, male    DOB: 08/13/1950, 68 y.o.   MRN: [id number]  Adrian Zavala is a 68 y.o. male presenting on 04/23/2018 for Cough (eye burning , nasal congestion, SOB, bodyache onset week and half or 10 days)  Patient presents for a same day appointment.  HPI   ACUTE SINUSITIS / FLU LIKE SYMPTOMS - Last contact with me 12/30, for sick symptoms triaged by phone and due to circumstances with holiday we were unable to see him in office, he was sent an Azithromycin Z-pak at that time, and advised to follow-up if did not improve. see prior notes for background information. - Interval update with significant improvement in his sinus / URI bronchitis symptoms after finishing Z-pak at that time, his symptoms were 100% resolved and he was back to baseline  - Today patient reports now he has had acute worsening symptoms over past 10 days approx, he admits multiple sick contacts, unsure if anyone with flu, he describes similar recurrent sinus symptoms congestion drainage, cough thicker sputum, he also admits some body aches, sinus pain and pressure - Started to feel better and then he noticed significant worsening again - Admits feeling feverish but not measured temp - Admits body aches - Admits some short of breath at times - Denies any nausea vomiting headache diarrhea chest pain   Health Maintenance: Due for booster pneumonia vaccine in future pneumovax-23  UTD Flu vaccine 11/2017  Depression screen [facility] 2/9 02/13/2018 02/10/2018 12/16/2017  Decreased Interest 0 0 0  Down, Depressed, Hopeless 0 0 0  PHQ - 2 Score 0 0 0    Social History   Tobacco Use  . Smoking status: Former Smoker    Last attempt to quit: 08/24/1996    Years since quitting: 21.6  . Smokeless tobacco: Former Network engineer Use Topics  . Alcohol use: Yes    Alcohol/week: 3.0 standard drinks    Types: 3 Cans of beer per week  . Drug use: No    Review of Systems Per HPI unless  specifically indicated above     Objective:    BP (!) 162/81   Pulse 81   Temp 98.4 F (36.9 C) (Oral)   Resp 16   Ht 5' 5"  (1.651 m)   Wt 185 lb (83.9 kg)   SpO2 98%   BMI 30.79 kg/m   Wt Readings from Last 3 Encounters:  04/23/18 185 lb (83.9 kg)  02/13/18 180 lb (81.6 kg)  02/10/18 180 lb (81.6 kg)    Physical Exam Vitals signs and nursing note reviewed.  Constitutional:      General: He is not in acute distress.    Appearance: He is well-developed. He is not diaphoretic.     Comments: Mostly well but tired-appearing, comfortable, cooperative  HENT:     Head: Normocephalic and atraumatic.     Comments: B/l maxillary sinuses mild-tender. Nares patent without purulence or edema. Oropharynx with post nasal drainage. Eyes:     General:        Right eye: No discharge.        Left eye: No discharge.     Conjunctiva/sclera: Conjunctivae normal.  Neck:     Musculoskeletal: Normal range of motion and neck supple.     Thyroid: No thyromegaly.  Cardiovascular:     Rate and Rhythm: Normal rate and regular rhythm.     Heart sounds: Normal heart sounds. No murmur.  Pulmonary:  Effort: Pulmonary effort is normal. No respiratory distress.     Breath sounds: Normal breath sounds. No wheezing or rales.     Comments: Bilateral lower rhonchi heard, some clear with cough. No focal crackles or wheezing. Speaks full sentences. Occasional cough. Musculoskeletal: Normal range of motion.  Lymphadenopathy:     Cervical: No cervical adenopathy.  Skin:    General: Skin is warm and dry.     Findings: No erythema or rash.  Neurological:     Mental Status: He is alert and oriented to person, place, and time.  Psychiatric:        Behavior: Behavior normal.     Comments: Well groomed, good eye contact, normal speech and thoughts      I have personally reviewed the radiology report from STAT Chest X-ray on 04/23/18.  CLINICAL DATA:  Shortness of breath, cough, and fever for 10  days.  EXAM: CHEST - 2 VIEW  COMPARISON:  06/25/2014  FINDINGS: The heart size and mediastinal contours are within normal limits. Aortic atherosclerosis. Both lungs are clear. The visualized skeletal structures are unremarkable.  IMPRESSION: No active cardiopulmonary disease.   Electronically Signed   By: Earle Gell M.D.   On: 04/23/2018 12:38  Results for orders placed or performed during the hospital encounter of 11/22/17  CBC with Differential  Result Value Ref [place]   WBC 15.3 (H) 3.8 - 10.6 K/uL   RBC 4.76 4.40 - 5.90 MIL/uL   Hemoglobin 15.6 13.0 - 18.0 g/dL   HCT 44.5 40.0 - 52.0 %   MCV 93.4 80.0 - 100.0 fL   MCH 32.8 26.0 - 34.0 pg   MCHC 35.1 32.0 - 36.0 g/dL   RDW 13.0 11.5 - 14.5 %   Platelets 217 150 - 440 K/uL   Neutrophils Relative % 86 %   Neutro Abs 13.3 (H) 1.4 - 6.5 K/uL   Lymphocytes Relative 7 %   Lymphs Abs 1.0 1.0 - 3.6 K/uL   Monocytes Relative 5 %   Monocytes Absolute 0.7 0.2 - 1.0 K/uL   Eosinophils Relative 1 %   Eosinophils Absolute 0.1 0 - 0.7 K/uL   Basophils Relative 1 %   Basophils Absolute 0.1 0 - 0.1 K/uL  Comprehensive metabolic panel  Result Value Ref [place]   Sodium 137 135 - 145 mmol/L   Potassium 3.3 (L) 3.5 - 5.1 mmol/L   Chloride 101 98 - 111 mmol/L   CO2 25 22 - 32 mmol/L   Glucose, Bld 80 70 - 99 mg/dL   BUN 35 (H) 8 - 23 mg/dL   Creatinine, Ser 1.18 0.61 - 1.24 mg/dL   Calcium 10.4 (H) 8.9 - 10.3 mg/dL   Total Protein 8.0 6.5 - 8.1 g/dL   Albumin 4.9 3.5 - 5.0 g/dL   AST 32 15 - 41 U/L   ALT 25 0 - 44 U/L   Alkaline Phosphatase 63 38 - 126 U/L   Total Bilirubin 0.9 0.3 - 1.2 mg/dL   GFR calc non Af Amer >60 >60 mL/min   GFR calc Af Amer >60 >60 mL/min   Anion gap 11 5 - 15  Urinalysis, Complete w Microscopic  Result Value Ref [place]   Color, Urine YELLOW (A) YELLOW   APPearance HAZY (A) CLEAR   Specific Gravity, Urine 1.021 1.005 - 1.030   pH 7.0 5.0 - 8.0   Glucose, UA NEGATIVE NEGATIVE mg/dL   Hgb  urine dipstick NEGATIVE NEGATIVE   Bilirubin Urine NEGATIVE NEGATIVE  Ketones, ur 20 (A) NEGATIVE mg/dL   Protein, ur NEGATIVE NEGATIVE mg/dL   Nitrite NEGATIVE NEGATIVE   Leukocytes, UA NEGATIVE NEGATIVE   RBC / HPF 0-5 0 - 5 RBC/hpf   WBC, UA 0-5 0 - 5 WBC/hpf   Bacteria, UA NONE SEEN NONE SEEN   Squamous Epithelial / LPF NONE SEEN 0 - 5  Troponin I  Result Value Ref [place]   Troponin I <0.03 <0.03 ng/mL      Assessment & Plan:   Problem List Items Addressed This Visit    None    Visit Diagnoses    Acute non-recurrent maxillary sinusitis    -  Primary   Relevant Medications   ipratropium (ATROVENT) 0.06 % nasal spray   benzonatate (TESSALON) 100 MG capsule   Shortness of breath       Relevant Orders   DG Chest 2 View (Completed)   Cough       Relevant Medications   benzonatate (TESSALON) 100 MG capsule   Other Relevant Orders   DG Chest 2 View (Completed)      Consistent with acute maxillary sinusitis, likely initially viral URI and now worsening concern for bacterial infection. With second sickening, and sick contacts, question possible other viral syndrome vs Flu but now >10 days out, clinically seems more focal on exam. - Question abnormal lung sounds, with his symptoms and his chronic medical history wegner's granulomatosis  Plan: Check STAT CXR today =- reviewed results after visit, notified patient, negative for pneumonia - Treat sinusitis with Start taking Levaquin antibiotic 564m daily x 7 days - Start Atrovent nasal spray decongestant 2 sprays in each nostril up to 4 times daily for 7 days - Start THumana Inctake 1 capsule up to 3 times a day as needed for cough - Plain mucinex without additive - Tylenol PRN - Return criteria reviewed if worsening   Meds ordered this encounter  Medications  . ipratropium (ATROVENT) 0.06 % nasal spray    Sig: Place 2 sprays into both nostrils 4 (four) times daily. For up to 5-7 days then stop.    Dispense:  15 mL     Refill:  0  . benzonatate (TESSALON) 100 MG capsule    Sig: Take 1 capsule (100 mg total) by mouth 3 (three) times daily as needed for cough.    Dispense:  30 capsule    Refill:  0    Follow up plan: Return in about 1 week (around 04/30/2018), or if symptoms worsen or fail to improve, for sinus / flu like.   ANobie Putnam [facility] 04/23/2018, 11:52 AM

## 2018-04-23 NOTE — Patient Instructions (Addendum)
Thank you for coming to the office today.  1. It sounds like you have a Sinusitis (Bacterial Infection) - this most likely started as an Upper Respiratory Virus that has settled into an infection. Allergies can also cause this.  It may have progressed to pneumonia  Chest X=-ray today stay tuned for result. - may add Levaquin if needed  Continue Loratadine  Start Atrovent nasal spray decongestant 2 sprays in each nostril up to 4 times daily for 7 days  Start Tessalon Perls take 1 capsule up to 3 times a day as needed for cough  - Recommend to start keep using Nasal Saline spray multiple times a day to help flush out congestion and clear sinuses - Improve hydration by drinking plenty of clear fluids (water, gatorade) to reduce secretions and thin congestion - Congestion draining down throat can cause irritation. May try warm herbal tea with honey, cough drops - Can take Tylenol or Ibuprofen as needed for fevers  May try OTC Mucinex up to 7-10 days then stop  If you develop persistent fever >101F for at least 3 consecutive days, headaches with sinus pain or pressure or persistent earache, please schedule a follow-up evaluation within next few days to week.   Please schedule a Follow-up Appointment to: Return in about 1 week (around 04/30/2018), or if symptoms worsen or fail to improve, for sinus / flu like.  If you have any other questions or concerns, please feel free to call the office or send a message through [place]. You may also schedule an earlier appointment if necessary.  Additionally, you may be receiving a survey about your experience at our office within a few days to 1 week by e-mail or mail. We value your feedback.  [name], DO [place]

## 2018-04-28 ENCOUNTER — Encounter: Payer: Self-pay | Admitting: Family Medicine

## 2018-04-28 ENCOUNTER — Ambulatory Visit: Payer: Managed Care, Other (non HMO) | Admitting: Family Medicine

## 2018-04-28 VITALS — BP 140/86 | HR 78 | Temp 98.0°F | Resp 16 | Ht 65.0 in | Wt 183.0 lb

## 2018-04-28 DIAGNOSIS — M7062 Trochanteric bursitis, left hip: Secondary | ICD-10-CM | POA: Diagnosis not present

## 2018-04-28 DIAGNOSIS — E039 Hypothyroidism, unspecified: Secondary | ICD-10-CM

## 2018-04-28 DIAGNOSIS — M150 Primary generalized (osteo)arthritis: Secondary | ICD-10-CM | POA: Diagnosis not present

## 2018-04-28 DIAGNOSIS — M7072 Other bursitis of hip, left hip: Secondary | ICD-10-CM | POA: Insufficient documentation

## 2018-04-28 DIAGNOSIS — R7309 Other abnormal glucose: Secondary | ICD-10-CM

## 2018-04-28 DIAGNOSIS — M159 Polyosteoarthritis, unspecified: Secondary | ICD-10-CM

## 2018-04-28 DIAGNOSIS — Z23 Encounter for immunization: Secondary | ICD-10-CM | POA: Diagnosis not present

## 2018-04-28 NOTE — Assessment & Plan Note (Signed)
Persistent episodes of L hip pain, likely underlying bursitis Followed by Orthopedics - continue with PT and imaging future may need other intervention

## 2018-04-28 NOTE — Progress Notes (Signed)
Subjective:    Patient ID: Adrian Zavala, male    DOB: Nov 04, 1950, 68 y.o.   MRN: [id number]  Talal Fritchman is a 68 y.o. male presenting on 04/28/2018 for Arthritis and Hypothyroidism   HPI   Specialist: Cardiology - Dr Rockey Situ ([facility] [place]) - Atrial valve leaflet abnormality, Descending Aorta Aneurysm Rheumatology - Dr Jefm Bryant [facility]) - Wegner's granulomatosis  Reviewed labs from 04/14/18, rheumatology  Follow-up Sinusitis - 1/29 - major improvement on levaquin, tessalon. Now mostly resolved sinusitis symptoms.  Elevated A1c Last lab 09/2017 showed elevated A1c to 5.5. No prior dx PreDM or DM Due for A1c repeat - now asking for [facility] order Tries to improve diet and low sugar. He does admit takes chronic prednisone >25+ years for Rheumatology 73m daily  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, hands, hips) Dr KCandelaria Stagers1/27 - [facility], L hip, X-ray, bursitis > PT > then may need CT vs MRI and or injection. He was advised that most likely his actual hip joint is well intact, but has likely more muscular or soft tissue injury - In past he had some issues with excessive walking limiting him and pain - Reports chronic problem with chronic joint pain and stiffness from arthritis. 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, has had some hand and finger joint deformity due to arthritis - Taking Tylenol Arthritis 6560mx 1 per dose in afternoon and evening with some relief - Taking Tramadol 508mabs BID with good relief, sometimes needs more often for flares  Hypothyroidism - Chronic problem. Taking Levothyroxine 24m16maily. Last TSH 09/2017 normal. Request repeat check.  Health Maintenance: Due for 2nd pneumonia vaccine >1 year after previous vaccine, now to receive Pneumovax-23 today   Depression screen PHQ [facility] 04/28/2018 02/13/2018 02/10/2018  Decreased Interest 0 0 0  Down, Depressed, Hopeless 0 0 0  PHQ - 2 Score 0 0 0    Social  History   Tobacco Use  . Smoking status: Former Smoker    Last attempt to quit: 08/24/1996    Years since quitting: 21.6  . Smokeless tobacco: Former UserNetwork engineer Topics  . Alcohol use: Yes    Alcohol/week: 3.0 standard drinks    Types: 3 Cans of beer per week  . Drug use: No    Review of Systems Per HPI unless specifically indicated above     Objective:    BP 140/86 (BP Location: Left Arm, Patient Position: Sitting, Cuff Size: Normal) Comment: manual  Pulse 78   Temp 98 F (36.7 C) (Oral)   Resp 16   Ht 5' 5"  (1.651 m)   Wt 183 lb (83 kg)   BMI 30.45 kg/m   Wt Readings from Last 3 Encounters:  04/28/18 183 lb (83 kg)  04/23/18 185 lb (83.9 kg)  02/13/18 180 lb (81.6 kg)    Physical Exam Vitals signs and nursing note reviewed.  Constitutional:      General: He is not in acute distress.    Appearance: He is well-developed. He is not diaphoretic.     Comments: Well-appearing, comfortable, cooperative  HENT:     Head: Normocephalic and atraumatic.     Comments: Frontal / maxillary sinuses non-tender. Nares patent without purulence or edema. Bilateral TMs clear without erythema, effusion or bulging. Oropharynx clear without erythema, exudates, edema or asymmetry. Eyes:     General:        Right eye: No discharge.  Left eye: No discharge.     Conjunctiva/sclera: Conjunctivae normal.  Neck:     Musculoskeletal: Normal range of motion and neck supple.     Thyroid: No thyromegaly.  Cardiovascular:     Rate and Rhythm: Normal rate and regular rhythm.     Heart sounds: Normal heart sounds. No murmur.  Pulmonary:     Effort: Pulmonary effort is normal. No respiratory distress.     Breath sounds: Normal breath sounds. No wheezing or rales.     Comments: Improved breath sounds, air movement Musculoskeletal: Normal range of motion.  Lymphadenopathy:     Cervical: No cervical adenopathy.  Skin:    General: Skin is warm and dry.     Findings: No erythema or  rash.  Neurological:     Mental Status: He is alert and oriented to person, place, and time.  Psychiatric:        Behavior: Behavior normal.     Comments: Well groomed, good eye contact, normal speech and thoughts        Assessment & Plan:   Problem List Items Addressed This Visit    Bursitis of left hip    Persistent episodes of L hip pain, likely underlying bursitis Followed by Orthopedics - continue with PT and imaging future may need other intervention      Elevated hemoglobin A1c    Previously Well-controlled Pre-DM with A1c stable < 5.7  Plan:  1. Not on any therapy currently 2. Encourage improved lifestyle - low carb, low sugar diet, reduce portion size, continue improving regular exercise 3. Follow-up [facility] A1c given today - review result then next 6 months       Relevant Orders   Hemoglobin A1c   Hypothyroidism - Primary    Stable controlled hypothyroidism Continue current Levothyroxine 94mg daily Will re-check TSH Free T4 - pending [facility]      Relevant Orders   TSH   T4, free   Osteoarthritis of multiple joints    Underlying etiology for chronic pain with OA/DJD multiple joints Controlled on Tylenol and Tramadol       Other Visit Diagnoses    Need for 23-polyvalent pneumococcal polysaccharide vaccine       Relevant Orders   Pneumococcal polysaccharide vaccine 23-valent greater than or equal to 2yo subcutaneous/IM (Completed)      No orders of the defined types were placed in this encounter.     Follow up plan: Return in about 6 months (around 10/27/2018) for Annual Physical ([facility] orders print at time of visit).  ANobie Putnam DO [facility] 04/28/2018, 9:53 AM

## 2018-04-28 NOTE — Patient Instructions (Addendum)
Thank you for coming to the office today.  Lab orders today for [facility] for A1c sugar and Thyroid  Keep up current plan to follow-up with Orthopedics for Hip including physical therapy.   DUE for FASTING BLOOD WORK (no food or drink after midnight before the lab appointment, only water or coffee without cream/sugar on the morning of)  SCHEDULE "Lab Only" visit in the morning at the clinic for lab draw in 6 MONTHS   - Make sure Lab Only appointment is at about 1 week before your next appointment, so that results will be available  For Lab Results, once available within 2-3 days of blood draw, you can can log in to MyChart online to view your results and a brief explanation. Also, we can discuss results at next follow-up visit.   Please schedule a Follow-up Appointment to: Return in about 6 months (around 10/27/2018) for Annual Physical ([facility] orders print at time of visit).  If you have any other questions or concerns, please feel free to call the office or send a message through [place]. You may also schedule an earlier appointment if necessary.  Additionally, you may be receiving a survey about your experience at our office within a few days to 1 week by e-mail or mail. We value your feedback.  [name], DO [place]

## 2018-04-28 NOTE — Assessment & Plan Note (Signed)
Stable controlled hypothyroidism Continue current Levothyroxine 50mg daily Will re-check TSH Free T4 - pending [facility]

## 2018-04-28 NOTE — Assessment & Plan Note (Signed)
Underlying etiology for chronic pain with OA/DJD multiple joints Controlled on Tylenol and Tramadol

## 2018-04-28 NOTE — Assessment & Plan Note (Signed)
Previously Well-controlled Pre-DM with A1c stable < 5.7  Plan:  1. Not on any therapy currently 2. Encourage improved lifestyle - low carb, low sugar diet, reduce portion size, continue improving regular exercise 3. Follow-up [facility] A1c given today - review result then next 6 months

## 2018-04-29 LAB — TSH: TSH: 1.61 u[IU]/mL (ref 0.450–4.500)

## 2018-04-29 LAB — T4, FREE: FREE T4: 1.44 ng/dL (ref 0.82–1.77)

## 2018-04-29 LAB — HEMOGLOBIN A1C
ESTIMATED AVERAGE GLUCOSE: 117 mg/dL
HEMOGLOBIN A1C: 5.7 % — AB (ref 4.8–5.6)

## 2018-05-12 ENCOUNTER — Other Ambulatory Visit: Payer: Self-pay | Admitting: Family Medicine

## 2018-05-12 DIAGNOSIS — E039 Hypothyroidism, unspecified: Secondary | ICD-10-CM

## 2018-05-12 DIAGNOSIS — M1990 Unspecified osteoarthritis, unspecified site: Secondary | ICD-10-CM

## 2018-05-18 NOTE — Progress Notes (Signed)
Cardiology Office Note  Date:  05/19/2018   ID:  Adrian Zavala, DOB [birth year], MRN [id number]  PCP:  Olin Hauser, DO   Chief Complaint  Patient presents with  . other    12 mo follow up. Medications reviewed verbally with patient.    HPI:  Adrian Zavala is a very pleasant 68 year old gentleman with a history of Wegener's, s/p chemo 1991,   Cushing's, on chronic prednisone,  long history of smoking who stopped in 1998,  hyperlipidemia on a statin,  heart murmur (Previous echocardiogram concerning for bicuspid aortic valve),  coronary artery disease, by CT  CT coronary calcium score 05/07/2016, Score of 497 Mild to moderately dilated ascending aorta 4.1 cm in 04/2016 Vertebral artery stenosis on MR  hyperlipidemia.   Echo 05/2017 Ascending aorta unchanged,  3.9 cm Discussed with him in detail, no significant change compared to CT scan 2018  Discussed recent evaluation in the emergency room IN the ER 11/22/2017 Pain, tremors, lost feeling in scalp and feet Flushing, Tachycardia Previous allergy to bactrim Hospital records reviewed with the patient in detail  MRA: 2 mm suspected A-comm aneurysm, Moderate stenosis RIGHT vertebral artery origin.  Weight up 180s Left hip pain, chronic Sedentary, no exercise Poor diet at work  No chest pain  Reports he is back on Crestor 20 daily with Zetia Previous myalgias on Lipitor   CT coronary calcium score 05/07/2016 Score of 497, 75th percentile based on age calcified aortic valve, Dilated ascending aorta 4.1 cm  EKG personally reviewed by myself on todays visit Shows normal sinus rhythm rate 68 bpm no significant ST or T wave changes  Other past medical history reviewed Stress test 05/14/2016 performed Showing no ischemia normal ejection fraction   Wegener's, long history of chemotherapy, on chronic methotrexate once per week, on chronic prednisone  Reports he's been on cholesterol medication since the 1980s,  when it first came out  Prior history and the [employer], Previous issues with gallbladder disease in his 23s  episode of nausea vomiting in November 2016  PMH:   has a past medical history of Arthritis, 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[place]), GERD (gastroesophageal reflux disease), Heart murmur, Hypercholesteremia, Hypothyroidism, Lung disorder, Neuromuscular disorder ([place]), Osteopenia, Osteoporosis, Seasonal allergies, and 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[place]).  PSH:    Past Surgical History:  Procedure Laterality Date  . CHOLECYSTECTOMY     has "metal sutures"   . COLONOSCOPY N/A 10/25/2014   Procedure: COLONOSCOPY;  Surgeon: Lucilla Lame, MD;  Location: [place];  Service: Gastroenterology;  Laterality: N/A;  . TREATMENT FISTULA ANAL     closure with rectal advancement flap    Current Outpatient Medications  Medication Sig Dispense Refill  . acetaminophen (TYLENOL) 650 MG CR tablet Take 350 mg by mouth every 8 (eight) hours as needed for pain or fever.     Marland Kitchen aspirin 81 MG tablet Take 81 mg by mouth daily. AM    . B Complex-C-Folic Acid (B-COMPLEX BALANCED PO) Take 1 tablet by mouth daily.    . calcium carbonate (OS-CAL) 600 MG TABS tablet Take 600 mg by mouth daily. AM    . ezetimibe (ZETIA) 10 MG tablet TAKE 1 TABLET BY MOUTH  DAILY 90 tablet 3  . famotidine (PEPCID) 40 MG tablet Take 1 tablet (40 mg total) by mouth daily. 90 tablet 0  . folic acid (FOLVITE) 300 MCG tablet Take 400 mcg by mouth daily. AM    . ibandronate (BONIVA) 150 MG tablet Take by mouth.    Marland Kitchen  levothyroxine (SYNTHROID, LEVOTHROID) 25 MCG tablet TAKE 1 TABLET BY MOUTH  DAILY 90 tablet 3  . magnesium oxide (MAG-OX) 400 MG tablet Take 400 mg by mouth daily.    . meloxicam (MOBIC) 15 MG tablet TAKE 1 TABLET BY MOUTH  DAILY 90 tablet 2  . niacin (NIASPAN) 500 MG CR tablet Take 750 mg by mouth at bedtime.    . predniSONE (DELTASONE) 5 MG tablet Take 5 mg by mouth daily.    . rosuvastatin (CRESTOR) 20 MG tablet Take 0.5 tablets  (10 mg total) by mouth daily. 45 tablet 0  . traMADol (ULTRAM) 50 MG tablet TAKE 1 TABLET BY MOUTH EVERY 6 HOURS AS NEEDED FOR PAIN 120 tablet 2  . Vitamin D, Cholecalciferol, 400 UNITS CAPS Take 400 Units by mouth daily.    Marland Kitchen loratadine (CLARITIN) 10 MG tablet Take 10 mg by mouth daily as needed for allergies.      No current facility-administered medications for this visit.      Allergies:   Sulfa antibiotics; Sulfamethoxazole-trimethoprim; Penicillins; Codeine; Prilosec [omeprazole]; Tetracycline; and Tetracyclines & related   Social History:  The patient  reports that he quit smoking about 21 years ago. He has quit using smokeless tobacco. He reports current alcohol use of about 3.0 standard drinks of alcohol per week. He reports that he does not use drugs.   Family History:   Family history is unknown by patient.    Review of Systems: Review of Systems  Constitutional: Negative.   Respiratory: Negative.   Cardiovascular: Negative.   Gastrointestinal: Negative.   Musculoskeletal: Positive for joint pain.  Neurological: Negative.   Psychiatric/Behavioral: Negative.   All other systems reviewed and are negative.    PHYSICAL EXAM: VS:  BP (!) 142/62 (BP Location: Left Arm, Patient Position: Sitting, Cuff Size: Normal)   Pulse 68   Ht 5' 6"  (1.676 m)   Wt 185 lb (83.9 kg)   BMI 29.86 kg/m  , BMI Body mass index is 29.86 kg/m. Constitutional:  oriented to person, place, and time. No distress.  HENT:  Head: Grossly normal Eyes:  no discharge. No scleral icterus.  Neck: No JVD, no carotid bruits  Cardiovascular: Regular rate and rhythm, 2/6 systolic ejection murmur appreciated right sternal border Pulmonary/Chest: Clear to auscultation bilaterally, no wheezes or rails Abdominal: Soft.  no distension.  no tenderness.  Musculoskeletal: Normal range of motion Neurological:  normal muscle tone. Coordination normal. No atrophy Skin: Skin warm and dry Psychiatric: normal  affect, pleasant   Recent Labs: 11/22/2017: ALT 25; BUN 35; Creatinine, Ser 1.18; Hemoglobin 15.6; Platelets 217; Potassium 3.3; Sodium 137 04/28/2018: TSH 1.610    Lipid Panel Lab Results  Component Value Date   CHOL 170 10/22/2017   HDL 61 10/22/2017   LDLCALC 74 10/22/2017   TRIG 176 (H) 10/22/2017      Wt Readings from Last 3 Encounters:  05/19/18 185 lb (83.9 kg)  04/28/18 183 lb (83 kg)  04/23/18 185 lb (83.9 kg)     ASSESSMENT AND PLAN:  Mixed hyperlipidemia Cholesterol still above goal, recommended he stay on Crestor 20 with Zetia daily Discussed new medication options coming out in the future He does not want higher dose Crestor given previous issues with myalgias  Essential hypertension Blood pressure high end of range, recommend weight loss, monitoring blood pressure at home No medication changes made  bicuspid aortic valve High suspicion of bicuspid aortic valve given long-standing murmur, calcified aortic valve, appearance of bicuspid aortic valve  on echocardiogram Repeat echocardiogram at his convenience  Aneurysm of ascending aorta (HCC) 4.1 cm on CT scan Echocardiogram 3.9 cm Repeat echocardiogram at his convenience  Coronary artery disease involving native coronary artery of native heart without angina pectoris significant coronary calcification noted in the LAD and RCA, score close to 500 Previous stress test with no ischemia Recommended need for aggressive lipid management, weight loss, better diet, walking program Discussed other options for lipid management   Total encounter time more than 25 minutes  Greater than 50% was spent in counseling and coordination of care with the patient   Disposition:   F/U  12 months   Orders Placed This Encounter  Procedures  . EKG 12-Lead     Signed, Esmond Plants, M.D., Ph.D. 05/19/2018  [place], [place]

## 2018-05-19 ENCOUNTER — Encounter: Payer: Self-pay | Admitting: Cardiovascular Disease

## 2018-05-19 ENCOUNTER — Ambulatory Visit: Payer: Managed Care, Other (non HMO) | Admitting: Cardiovascular Disease

## 2018-05-19 VITALS — BP 142/62 | HR 68 | Ht 66.0 in | Wt 185.0 lb

## 2018-05-19 DIAGNOSIS — I712 Thoracic aortic aneurysm, without rupture: Secondary | ICD-10-CM

## 2018-05-19 DIAGNOSIS — I25118 Atherosclerotic heart disease of native coronary artery with other forms of angina pectoris: Secondary | ICD-10-CM

## 2018-05-19 DIAGNOSIS — E782 Mixed hyperlipidemia: Secondary | ICD-10-CM

## 2018-05-19 DIAGNOSIS — I7121 Aneurysm of the ascending aorta, without rupture: Secondary | ICD-10-CM

## 2018-05-19 DIAGNOSIS — I10 Essential (primary) hypertension: Secondary | ICD-10-CM

## 2018-05-19 DIAGNOSIS — Q231 Congenital insufficiency of aortic valve: Secondary | ICD-10-CM

## 2018-05-19 MED ORDER — ROSUVASTATIN CALCIUM 20 MG PO TABS: 20.0000 mg | ORAL_TABLET | Freq: Every day | ORAL | 3 refills | Status: DC

## 2018-05-19 NOTE — Patient Instructions (Signed)

## 2018-07-08 ENCOUNTER — Other Ambulatory Visit: Payer: Self-pay

## 2018-07-08 DIAGNOSIS — K219 Gastro-esophageal reflux disease without esophagitis: Secondary | ICD-10-CM

## 2018-07-08 MED ORDER — FAMOTIDINE 40 MG PO TABS: 40.0000 mg | ORAL_TABLET | Freq: Every day | ORAL | 1 refills | Status: DC

## 2018-09-15 ENCOUNTER — Other Ambulatory Visit: Payer: Self-pay | Admitting: Cardiovascular Disease

## 2018-10-21 ENCOUNTER — Other Ambulatory Visit: Payer: Self-pay | Admitting: Family Medicine

## 2018-10-21 DIAGNOSIS — M1990 Unspecified osteoarthritis, unspecified site: Secondary | ICD-10-CM

## 2018-10-27 ENCOUNTER — Encounter: Payer: Self-pay | Admitting: Family Medicine

## 2018-10-27 ENCOUNTER — Ambulatory Visit (INDEPENDENT_AMBULATORY_CARE_PROVIDER_SITE_OTHER): Payer: Managed Care, Other (non HMO) | Admitting: Family Medicine

## 2018-10-27 ENCOUNTER — Other Ambulatory Visit: Payer: Self-pay

## 2018-10-27 ENCOUNTER — Encounter: Payer: Managed Care, Other (non HMO) | Admitting: Family Medicine

## 2018-10-27 DIAGNOSIS — M3130 Wegener's granulomatosis without renal involvement: Secondary | ICD-10-CM

## 2018-10-27 DIAGNOSIS — J0100 Acute maxillary sinusitis, unspecified: Secondary | ICD-10-CM

## 2018-10-27 MED ORDER — FLUTICASONE PROPIONATE 50 MCG/ACT NA SUSP: 2.0000 | Freq: Every day | NASAL | 3 refills | Status: AC

## 2018-10-27 MED ORDER — LEVOFLOXACIN 500 MG PO TABS: 500.0000 mg | ORAL_TABLET | Freq: Every day | ORAL | 0 refills | Status: DC

## 2018-10-27 NOTE — Patient Instructions (Addendum)
Start taking Levaquin antibiotic 569m daily x 7 days  Start nasal steroid Flonase 2 sprays in each nostril daily for 4-6 weeks, may repeat course seasonally or as needed  F/u with Dr KJefm Bryantfor Rheumatology as planned  Please schedule a Follow-up Appointment to: Return in about 4 weeks (around 11/24/2018), or if symptoms worsen or fail to improve, for re-schedule physical when ready.  If you have any other questions or concerns, please feel free to call the office or send a message through [place] You may also schedule an earlier appointment if necessary.  Additionally, you may be receiving a survey about your experience at our office within a few days to 1 week by e-mail or mail. We value your feedback.  ANobie Putnam DO [facility] [place]

## 2018-10-27 NOTE — Progress Notes (Signed)
Virtual Visit via Telephone The purpose of this virtual visit is to provide medical care while limiting exposure to the novel coronavirus (COVID19) for both patient and office staff.  Consent was obtained for phone visit:  Yes.   Answered questions that patient had about telehealth interaction:  Yes.   I discussed the limitations, risks, security and privacy concerns of performing an evaluation and management service by telephone. I also discussed with the patient that there may be a patient responsible charge related to this service. The patient expressed understanding and agreed to proceed.  Patient Location: Home Provider Location: [facility])  ---------------------------------------------------------------------- Chief Complaint  Patient presents with  . Wegener's granulomatosis    AST, ALT level were abnormal when Dr. Jefm Bryant checked it so he ordered more test to recheck    S: Reviewed CMA documentation. I have called patient and gathered additional HPI as follows:  Wegener's Granulomatosis / Elevated LFTs Dr Jefm Bryant Rheumatology had lab with elevated WBC, elevated AST ALT and they advised to stop Rosuvastatin and meloxicam for now, they will repeat. Today pending ANCA panel, and urine test, they advised may need to restart Methotrexate. He had prior course of flare up within past 8 years, required methotrexate for about 4 years. He will f/u with Rheumatology as planned.  Sinusitis Reports that symptoms started within past 1 week, tip of nose, drainage of congestion and some pus drainage, can feel some sinus pressure pain and deeper. He says in past wegener's flare up started similar with sinus infection. He has improved on levaquin in past 03/2018, he failed azithromycin before, has allergy to other antibiotics. Using Flonase but it is expired, needs new order.   Denies any high risk travel to areas of current concern for COVID19. Denies any known or  suspected exposure to person with or possibly with COVID19.  Admits sinus pain pressure congestion purulence. Denies any fevers, chills, sweats, body ache, cough, shortness of breath, abdominal pain, diarrhea  Past Medical History:  Diagnosis Date  . Arthritis    Spine, Left Hip, Hands  . 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[place])   . GERD (gastroesophageal reflux disease)   . Heart murmur   . Hypercholesteremia   . Hypothyroidism    graves  . Lung disorder    S/P chemo for Wegener's  . Neuromuscular disorder ([place])    some leg weakness S/P Chemo for Wegener's  . Osteopenia   . Osteoporosis    Femur  . Seasonal allergies   . 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[place])    Social History   Tobacco Use  . Smoking status: Former Smoker    Quit date: 08/24/1996    Years since quitting: 22.1  . Smokeless tobacco: Former Network engineer Use Topics  . Alcohol use: Yes    Alcohol/week: 3.0 standard drinks    Types: 3 Cans of beer per week  . Drug use: No    Current Outpatient Medications:  .  acetaminophen (TYLENOL) 650 MG CR tablet, Take 350 mg by mouth every 8 (eight) hours as needed for pain or fever. , Disp: , Rfl:  .  aspirin 81 MG tablet, Take 81 mg by mouth daily. AM, Disp: , Rfl:  .  B Complex-C-Folic Acid (B-COMPLEX BALANCED PO), Take 1 tablet by mouth daily., Disp: , Rfl:  .  calcium carbonate (OS-CAL) 600 MG TABS tablet, Take 600 mg by mouth daily. AM, Disp: , Rfl:  .  ezetimibe (ZETIA) 10 MG tablet, TAKE 1 TABLET BY MOUTH  DAILY, Disp: 90 tablet, Rfl: 1 .  famotidine (PEPCID) 40 MG tablet, Take 1 tablet (40 mg total) by mouth daily., Disp: 90 tablet, Rfl: 1 .  folic acid (FOLVITE) 258 MCG tablet, Take 400 mcg by mouth daily. AM, Disp: , Rfl:  .  ibandronate (BONIVA) 150 MG tablet, Take by mouth., Disp: , Rfl:  .  levothyroxine (SYNTHROID, LEVOTHROID) 25 MCG tablet, TAKE 1 TABLET BY MOUTH  DAILY, Disp: 90 tablet, Rfl: 3 .  loratadine (CLARITIN) 10 MG tablet, Take 10 mg by mouth daily as needed for  allergies. , Disp: , Rfl:  .  magnesium oxide (MAG-OX) 400 MG tablet, Take 400 mg by mouth daily., Disp: , Rfl:  .  niacin (NIASPAN) 500 MG CR tablet, Take 750 mg by mouth at bedtime., Disp: , Rfl:  .  predniSONE (DELTASONE) 5 MG tablet, Take 5 mg by mouth daily., Disp: , Rfl:  .  traMADol (ULTRAM) 50 MG tablet, TAKE 1 TABLET BY MOUTH EVERY 6 HOURS AS NEEDED FOR PAIN, Disp: 120 tablet, Rfl: 2 .  Vitamin D, Cholecalciferol, 400 UNITS CAPS, Take 400 Units by mouth daily., Disp: , Rfl:   Depression screen [facility] 2/9 10/27/2018 04/28/2018 02/13/2018  Decreased Interest 0 0 0  Down, Depressed, Hopeless 0 0 0  PHQ - 2 Score 0 0 0    No flowsheet data found.  -------------------------------------------------------------------------- O: No physical exam performed due to remote telephone encounter.  Lab results reviewed.  No results found for this or any previous visit (from the past 2160 hour(s)).  -------------------------------------------------------------------------- A&P:  Problem List Items Addressed This Visit    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[place]) - Primary    Other Visit Diagnoses    Acute non-recurrent maxillary sinusitis         Consistent with acute maxillary sinusitis, worsening concern for bacterial infection - in setting of autoimmune wegener's, has had similar sinusitis before  Plan: 1. Start taking Levaquin antibiotic 578m daily x 7 days 2. Resume Flonase 2 sprays in each nostril daily for next 4-6 weeks, then may stop and use seasonally or as needed Return criteria reviewed  [facility] and clinical follow up for autoimmune wegener's  Defer his annual physical for now  No orders of the defined types were placed in this encounter.   Follow-up: - Return in 4+ weeks as needed for Annual Physical - [facility] orders per Rheumatology.  Patient verbalizes understanding with the above medical recommendations including the limitation of remote  medical advice.  Specific follow-up and call-back criteria were given for patient to follow-up or seek medical care more urgently if needed.   - Time spent in direct consultation with patient on phone: 7 minutes  ANobie Putnam [facility] 10/27/2018, 9:41 AM

## 2018-12-08 ENCOUNTER — Other Ambulatory Visit: Payer: Self-pay

## 2018-12-08 ENCOUNTER — Ambulatory Visit (INDEPENDENT_AMBULATORY_CARE_PROVIDER_SITE_OTHER): Payer: Managed Care, Other (non HMO) | Admitting: Family Medicine

## 2018-12-08 ENCOUNTER — Encounter: Payer: Self-pay | Admitting: Family Medicine

## 2018-12-08 VITALS — BP 123/67 | HR 74 | Temp 98.7°F | Resp 16 | Ht 66.0 in | Wt 183.0 lb

## 2018-12-08 DIAGNOSIS — K219 Gastro-esophageal reflux disease without esophagitis: Secondary | ICD-10-CM

## 2018-12-08 DIAGNOSIS — N401 Enlarged prostate with lower urinary tract symptoms: Secondary | ICD-10-CM

## 2018-12-08 DIAGNOSIS — Z0000 Encounter for general adult medical examination without abnormal findings: Secondary | ICD-10-CM | POA: Diagnosis not present

## 2018-12-08 DIAGNOSIS — R351 Nocturia: Secondary | ICD-10-CM

## 2018-12-08 DIAGNOSIS — R7309 Other abnormal glucose: Secondary | ICD-10-CM

## 2018-12-08 DIAGNOSIS — I10 Essential (primary) hypertension: Secondary | ICD-10-CM

## 2018-12-08 DIAGNOSIS — E039 Hypothyroidism, unspecified: Secondary | ICD-10-CM | POA: Diagnosis not present

## 2018-12-08 DIAGNOSIS — Z23 Encounter for immunization: Secondary | ICD-10-CM

## 2018-12-08 DIAGNOSIS — E782 Mixed hyperlipidemia: Secondary | ICD-10-CM | POA: Diagnosis not present

## 2018-12-08 DIAGNOSIS — Z20828 Contact with and (suspected) exposure to other viral communicable diseases: Secondary | ICD-10-CM

## 2018-12-08 DIAGNOSIS — M3130 Wegener's granulomatosis without renal involvement: Secondary | ICD-10-CM

## 2018-12-08 DIAGNOSIS — Z20822 Contact with and (suspected) exposure to covid-19: Secondary | ICD-10-CM

## 2018-12-08 MED ORDER — FAMOTIDINE 40 MG PO TABS: 40.0000 mg | ORAL_TABLET | Freq: Every day | ORAL | 3 refills | Status: DC

## 2018-12-08 NOTE — Patient Instructions (Addendum)
Thank you for coming to the office today.  Flu shot today  Labs ordered, [facility] - including COVID19 antibody  Stay tuned for results, Liver results, will forward. May need to adjust statin again in future.  Keep in mind occasionally statins can cause elevated LFT but no damage to liver, may warrant GI consult in future   Please schedule a Follow-up Appointment to: Return in about 6 months (around 06/07/2019) for 6 month follow-up.  If you have any other questions or concerns, please feel free to call the office or send a message through [place]. You may also schedule an earlier appointment if necessary.  Additionally, you may be receiving a survey about your experience at our office within a few days to 1 week by e-mail or mail. We value your feedback.  [name], DO [place]

## 2018-12-08 NOTE — Progress Notes (Addendum)
Subjective:    Patient ID: Winferd Wease, male    DOB: 04/28/1950, 68 y.o.   MRN: [id number]  Daegon Deiss is a 68 y.o. male presenting on 12/08/2018 for Annual Exam   HPI   Here for Annual Physical and Lab fasting orders.  Physical Currently doing well. Without new concerns. Reviewed diet and lifestyle current plans  Rheum / Hyperlipidemia Took off cholesterol medicine and meloxicam, and restarted Rosuvastatin, had elevated LFT that resolved, not restart Meloxicam. His results improved off medications. - He noticed change in bowels was more regular off medication, occasionally can have darker stool, often more pungent - Followed by Cardiology - He is on Zetia and Crestor  GERD Reorder famotidine  Sinus infection RESOLVED on antibiotic   Health Maintenance: Requesting COVID19 antibody IGG per [facility] Request   Depression screen [facility] 2/9 12/08/2018 10/27/2018 04/28/2018  Decreased Interest 0 0 0  Down, Depressed, Hopeless 0 0 0  PHQ - 2 Score 0 0 0    Past Medical History:  Diagnosis Date  . Arthritis    Spine, Left Hip, Hands  . 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[place])   . GERD (gastroesophageal reflux disease)   . Heart murmur   . Hypercholesteremia   . Hypothyroidism    graves  . Lung disorder    S/P chemo for Wegener's  . Neuromuscular disorder ([place])    some leg weakness S/P Chemo for Wegener's  . Osteopenia   . Osteoporosis    Femur  . Seasonal allergies   . 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 CHOLECYSTECTOMY     has "metal sutures"   . COLONOSCOPY N/A 10/25/2014   Procedure: COLONOSCOPY;  Surgeon: Lucilla Lame, MD;  Location: [place];  Service: Gastroenterology;  Laterality: N/A;  . TREATMENT FISTULA ANAL     closure with rectal advancement flap   Social History   Socioeconomic History  . Marital status: Married    Spouse name: Macy Lingenfelter  . Number of children: Not on file  . Years of education: Not  on file  . Highest education level: Not on file  Occupational History  . 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 Financial resource strain: Not on file  . Food insecurity    Worry: Not on file    Inability: Not on file  . Transportation needs    Medical: Not on file    Non-medical: Not on file  Tobacco Use  . Smoking status: Former Smoker    Quit date: 08/24/1996    Years since quitting: 22.3  . Smokeless tobacco: Former Network engineer and Sexual Activity  . Alcohol use: Yes    Alcohol/week: 3.0 standard drinks    Types: 3 Cans of beer per week  . Drug use: No  . Sexual activity: Not on file  Lifestyle  . Physical activity    Days per week: Not on file    Minutes per session: Not on file  . Stress: Not on file  Relationships  . Social Herbalist on phone: Not on file    Gets together: Not on file    Attends religious service: Not on file    Active member of club or organization: Not on file    Attends meetings of clubs or organizations: Not on file    Relationship status: Not on file  . Intimate partner violence    Fear of current or ex partner:  Not on file    Emotionally abused: Not on file    Physically abused: Not on file    Forced sexual activity: Not on file  Other Topics Concern  . Not on file  Social History Narrative  . Not on file   Family History  Family history unknown: Yes   Current Outpatient Medications on File Prior to Visit  Medication Sig  . acetaminophen (TYLENOL) 650 MG CR tablet Take 350 mg by mouth every 8 (eight) hours as needed for pain or fever.   Marland Kitchen aspirin 81 MG tablet Take 81 mg by mouth daily. AM  . B Complex-C-Folic Acid (B-COMPLEX BALANCED PO) Take 1 tablet by mouth daily.  . calcium carbonate (OS-CAL) 600 MG TABS tablet Take 600 mg by mouth daily. AM  . ezetimibe (ZETIA) 10 MG tablet TAKE 1 TABLET BY MOUTH  DAILY  . fluticasone (FLONASE) 50 MCG/ACT nasal spray Place 2 sprays into both nostrils daily.  Use for 4-6 weeks then stop and use seasonally or as needed.  . folic acid (FOLVITE) 503 MCG tablet Take 400 mcg by mouth daily. AM  . ibandronate (BONIVA) 150 MG tablet Take by mouth.  . levothyroxine (SYNTHROID, LEVOTHROID) 25 MCG tablet TAKE 1 TABLET BY MOUTH  DAILY  . loratadine (CLARITIN) 10 MG tablet Take 10 mg by mouth daily as needed for allergies.   . magnesium oxide (MAG-OX) 400 MG tablet Take 400 mg by mouth daily.  . niacin (NIASPAN) 500 MG CR tablet Take 750 mg by mouth at bedtime.  . predniSONE (DELTASONE) 5 MG tablet Take 5 mg by mouth daily.  . rosuvastatin (CRESTOR) 20 MG tablet Take 20 mg by mouth daily.  . traMADol (ULTRAM) 50 MG tablet TAKE 1 TABLET BY MOUTH EVERY 6 HOURS AS NEEDED FOR PAIN  . Vitamin D, Cholecalciferol, 400 UNITS CAPS Take 400 Units by mouth daily.   No current facility-administered medications on file prior to visit.     Review of Systems Per HPI unless specifically indicated above     Objective:    BP 123/67   Pulse 74   Temp 98.7 F (37.1 C) (Oral)   Resp 16   Ht 5' 6"  (1.676 m)   Wt 183 lb (83 kg)   BMI 29.54 kg/m   Wt Readings from Last 3 Encounters:  12/08/18 183 lb (83 kg)  05/19/18 185 lb (83.9 kg)  04/28/18 183 lb (83 kg)    Physical Exam Vitals signs and nursing note reviewed.  Constitutional:      General: He is not in acute distress.    Appearance: He is well-developed. He is not diaphoretic.     Comments: Well-appearing, comfortable, cooperative  HENT:     Head: Normocephalic and atraumatic.  Eyes:     General:        Right eye: No discharge.        Left eye: No discharge.     Conjunctiva/sclera: Conjunctivae normal.     Pupils: Pupils are equal, round, and reactive to light.  Neck:     Musculoskeletal: Normal range of motion and neck supple.     Thyroid: No thyromegaly.  Cardiovascular:     Rate and Rhythm: Normal rate and regular rhythm.     Heart sounds: Normal heart sounds. No murmur.  Pulmonary:      Effort: Pulmonary effort is normal. No respiratory distress.     Breath sounds: Normal breath sounds. No wheezing or rales.  Abdominal:  General: Bowel sounds are normal. There is no distension.     Palpations: Abdomen is soft. There is no mass.     Tenderness: There is no abdominal tenderness.  Musculoskeletal: Normal range of motion.        General: No tenderness.     Comments: Upper / Lower Extremities: - Normal muscle tone, strength bilateral upper extremities 5/5, lower extremities 5/5  Lymphadenopathy:     Cervical: No cervical adenopathy.  Skin:    General: Skin is warm and dry.     Findings: No erythema or rash.     Comments: Dry thick skin plaque on left elbow  Neurological:     Mental Status: He is alert and oriented to person, place, and time.     Comments: Distal sensation intact to light touch all extremities  Psychiatric:        Behavior: Behavior normal.     Comments: Well groomed, good eye contact, normal speech and thoughts      Results for orders placed or performed in visit on 04/28/18  Hemoglobin A1c  Result Value Ref Range   Hgb A1c MFr Bld 5.7 (H) 4.8 - 5.6 %   Est. average glucose Bld gHb Est-mCnc 117 mg/dL  TSH  Result Value Ref Range   TSH 1.610 0.450 - 4.500 uIU/mL  T4, free  Result Value Ref Range   Free T4 1.44 0.82 - 1.77 ng/dL      Assessment & Plan:   Problem List Items Addressed This Visit    BPH (benign prostatic hyperplasia)   Relevant Orders   PSA   Elevated hemoglobin A1c   Relevant Orders   Hemoglobin A1c   Essential hypertension   Relevant Medications   rosuvastatin (CRESTOR) 20 MG tablet   Other Relevant Orders   CBC with Differential/Platelet   Comprehensive metabolic panel   HLD (hyperlipidemia)   Relevant Medications   rosuvastatin (CRESTOR) 20 MG tablet   Other Relevant Orders   Lipid panel   TSH   T4, free   Hypothyroidism   Relevant Orders   TSH   T4, free   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[place])   Relevant  Medications   rosuvastatin (CRESTOR) 20 MG tablet   Other Relevant Orders   CBC with Differential/Platelet    Other Visit Diagnoses    Annual physical exam    -  Primary   Relevant Orders   Hemoglobin A1c   CBC with Differential/Platelet   Lipid panel   PSA   Comprehensive metabolic panel   TSH   Exposure to Covid-19 Virus       Relevant Orders   SAR CoV2 Serology (COVID 19)AB(IGG)IA   Needs flu shot       Relevant Orders   Flu Vaccine QUAD High Dose(Fluad) (Completed)   Gastroesophageal reflux disease, esophagitis presence not specified       Relevant Medications   famotidine (PEPCID) 40 MG tablet       Updated Health Maintenance information - Controlled BP - Due for labs - most drawn and will add extra by preference with COVID antibody Encouraged improvement to lifestyle with diet and exercise - Goal of weight loss   Meds ordered this encounter  Medications  . famotidine (PEPCID) 40 MG tablet    Sig: Take 1 tablet (40 mg total) by mouth daily.    Dispense:  90 tablet    Refill:  3     Follow up plan: Return in about 6 months (around 06/07/2019) for 6  month follow-up.  [name], [facility] 12/08/2018, 10:20 AM

## 2018-12-09 LAB — CBC WITH DIFFERENTIAL/PLATELET
Basophils Absolute: 0.1 10*3/uL (ref 0.0–0.2)
Basos: 1 %
EOS (ABSOLUTE): 0.4 10*3/uL (ref 0.0–0.4)
Eos: 4 %
Hematocrit: 44.4 % (ref 37.5–51.0)
Hemoglobin: 15.1 g/dL (ref 13.0–17.7)
Immature Grans (Abs): 0.1 10*3/uL (ref 0.0–0.1)
Immature Granulocytes: 1 %
Lymphocytes Absolute: 2.2 10*3/uL (ref 0.7–3.1)
Lymphs: 21 %
MCH: 31.5 pg (ref 26.6–33.0)
MCHC: 34 g/dL (ref 31.5–35.7)
MCV: 93 fL (ref 79–97)
Monocytes Absolute: 1 10*3/uL — ABNORMAL HIGH (ref 0.1–0.9)
Monocytes: 10 %
Neutrophils Absolute: 6.7 10*3/uL (ref 1.4–7.0)
Neutrophils: 63 %
Platelets: 219 10*3/uL (ref 150–450)
RBC: 4.8 x10E6/uL (ref 4.14–5.80)
RDW: 12.4 % (ref 11.6–15.4)
WBC: 10.5 10*3/uL (ref 3.4–10.8)

## 2018-12-09 LAB — COMPREHENSIVE METABOLIC PANEL
ALT: 16 IU/L (ref 0–44)
AST: 21 IU/L (ref 0–40)
Albumin/Globulin Ratio: 2.1 (ref 1.2–2.2)
Albumin: 4.8 g/dL (ref 3.8–4.8)
Alkaline Phosphatase: 51 IU/L (ref 39–117)
BUN/Creatinine Ratio: 17 (ref 10–24)
BUN: 21 mg/dL (ref 8–27)
Bilirubin Total: 0.7 mg/dL (ref 0.0–1.2)
CO2: 24 mmol/L (ref 20–29)
Calcium: 10.5 mg/dL — ABNORMAL HIGH (ref 8.6–10.2)
Chloride: 103 mmol/L (ref 96–106)
Creatinine, Ser: 1.22 mg/dL (ref 0.76–1.27)
GFR calc Af Amer: 70 mL/min/{1.73_m2} (ref >59)
GFR calc non Af Amer: 61 mL/min/{1.73_m2} (ref >59)
Globulin, Total: 2.3 g/dL (ref 1.5–4.5)
Glucose: 83 mg/dL (ref 65–99)
Potassium: 4.4 mmol/L (ref 3.5–5.2)
Sodium: 143 mmol/L (ref 134–144)
Total Protein: 7.1 g/dL (ref 6.0–8.5)

## 2018-12-09 LAB — LIPID PANEL
Chol/HDL Ratio: 2.6 ratio (ref 0.0–5.0)
Cholesterol, Total: 150 mg/dL (ref 100–199)
HDL: 58 mg/dL (ref >39)
LDL Chol Calc (NIH): 56 mg/dL (ref 0–99)
Triglycerides: 226 mg/dL — ABNORMAL HIGH (ref 0–149)
VLDL Cholesterol Cal: 36 mg/dL (ref 5–40)

## 2018-12-09 LAB — SAR COV2 SEROLOGY (COVID19)AB(IGG),IA: SARS-CoV-2 Ab, IgG: NEGATIVE

## 2018-12-09 LAB — T4, FREE: Free T4: 1.22 ng/dL (ref 0.82–1.77)

## 2018-12-09 LAB — TSH: TSH: 2.14 u[IU]/mL (ref 0.450–4.500)

## 2018-12-09 LAB — PSA: Prostate Specific Ag, Serum: 0.7 ng/mL (ref 0.0–4.0)

## 2018-12-09 LAB — HEMOGLOBIN A1C
Est. average glucose Bld gHb Est-mCnc: 114 mg/dL
Hgb A1c MFr Bld: 5.6 % (ref 4.8–5.6)

## 2019-01-31 ENCOUNTER — Other Ambulatory Visit: Payer: Self-pay | Admitting: Family Medicine

## 2019-01-31 DIAGNOSIS — K219 Gastro-esophageal reflux disease without esophagitis: Secondary | ICD-10-CM

## 2019-03-05 ENCOUNTER — Other Ambulatory Visit: Payer: Self-pay | Admitting: Cardiovascular Disease

## 2019-04-12 ENCOUNTER — Other Ambulatory Visit: Payer: Self-pay | Admitting: Family Medicine

## 2019-04-12 ENCOUNTER — Other Ambulatory Visit: Payer: Self-pay | Admitting: Cardiovascular Disease

## 2019-04-12 DIAGNOSIS — E039 Hypothyroidism, unspecified: Secondary | ICD-10-CM

## 2019-04-16 ENCOUNTER — Other Ambulatory Visit: Payer: Self-pay | Admitting: Family Medicine

## 2019-04-16 DIAGNOSIS — M1990 Unspecified osteoarthritis, unspecified site: Secondary | ICD-10-CM

## 2019-05-18 ENCOUNTER — Ambulatory Visit: Payer: Managed Care, Other (non HMO) | Attending: Internal Medicine

## 2019-05-18 DIAGNOSIS — Z23 Encounter for immunization: Secondary | ICD-10-CM | POA: Insufficient documentation

## 2019-05-18 NOTE — Progress Notes (Signed)
   Covid-19 Vaccination Clinic  Name:  Adrian Zavala    MRN: [id number] DOB: 06/17/1950  05/18/2019  Mr. [name] was observed post Covid-19 immunization for 30 minutes based on pre-vaccination screening without incidence. He was provided with Vaccine Information Sheet and instruction to access the V-Safe system.   Mr. [name] was instructed to call 911 with any severe reactions post vaccine: Marland Kitchen Difficulty breathing  . Swelling of your face and throat  . A fast heartbeat  . A bad rash all over your body  . Dizziness and weakness    Immunizations Administered    Name Date Dose VIS Date Route   Moderna COVID-19 Vaccine 05/18/2019  1:01 PM 0.5 mL 02/24/2019 Intramuscular   Manufacturer: Moderna   Lot: [id number]   [place]: [id number]

## 2019-06-07 NOTE — Progress Notes (Signed)
Cardiology Office Note  Date:  06/09/2019   ID:  Adrian Zavala, DOB 11-18-1950, MRN [id number]  PCP:  Olin Hauser, DO   Chief Complaint  Patient presents with  . OTHER    12 month f/u sinus flare up. Meds reviewed verbally with pt.    HPI:  [name] is a  69 year old gentleman with a history of  Wegener's, s/p chemo 1991,   Cushing's, on chronic prednisone,  long history of smoking who stopped in 1998,  hyperlipidemia heart murmur (Previous echocardiogram concerning for bicuspid aortic valve),  coronary artery disease, by CT  CT coronary calcium score 05/07/2016, Score of 497 Mild to moderately dilated ascending aorta 4.1 cm in 04/2016 Vertebral artery stenosis on MR hyperlipidemia.  Who presents for follow-up of his dilated aorta, coronary calcification  Wegeners flair up, right side of face On prednisone H/A last last night  Weight stable, 180s Works at [employer], lots of walking Diet stable  Left hip pain, chronic, stable If walks too much, has a cane  No chest pain, SOB  Reports he is back on Crestor 20 daily with Zetia Previous myalgias on Lipitor  Total chol 150, LDL 56,  HBA1C 5.6   CT coronary calcium score 05/07/2016 Score of 497, 75th percentile based on age calcified aortic valve, Dilated ascending aorta 4.1 cm  EKG personally reviewed by myself on todays visit Shows normal sinus rhythm rate 73 bpm no significant ST or T wave changes  Echo 05/2017 Ascending aorta unchanged,  3.9 cm Discussed with him in detail, no significant change compared to CT scan 2018  IN the ER 11/22/2017 Pain, tremors, lost feeling in scalp and feet Flushing, Tachycardia Previous allergy to bactrim   MRA: 2 mm suspected A-comm aneurysm, Moderate stenosis RIGHT vertebral artery origin.  Other past medical history reviewed Stress test 05/14/2016 performed Showing no ischemia normal ejection fraction   Wegener's, long history of chemotherapy, on chronic  methotrexate once per week, on chronic prednisone  Reports he's been on cholesterol medication since the 1980s, when it first came out  Prior history and the [employer], Previous issues with gallbladder disease in his 45s  episode of nausea vomiting in November 2016  PMH:   has a past medical history of Arthritis, 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[place]), GERD (gastroesophageal reflux disease), Heart murmur, Hypercholesteremia, Hypothyroidism, Lung disorder, Neuromuscular disorder ([place]), Osteopenia, Osteoporosis, Seasonal allergies, and 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  PSH:    Past Surgical History:  Procedure Laterality Date  . CHOLECYSTECTOMY     has "metal sutures"   . COLONOSCOPY N/A 10/25/2014   Procedure: COLONOSCOPY;  Surgeon: Lucilla Lame, MD;  Location: [place];  Service: Gastroenterology;  Laterality: N/A;  . TREATMENT FISTULA ANAL     closure with rectal advancement flap    Current Outpatient Medications  Medication Sig Dispense Refill  . acetaminophen (TYLENOL) 650 MG CR tablet Take 350 mg by mouth every 8 (eight) hours as needed for pain or fever.     Marland Kitchen aspirin 81 MG tablet Take 81 mg by mouth daily. AM    . B Complex-C-Folic Acid (B-COMPLEX BALANCED PO) Take 1 tablet by mouth daily.    . calcium carbonate (OS-CAL) 600 MG TABS tablet Take 600 mg by mouth daily. AM    . clindamycin (CLEOCIN) 150 MG capsule Take 150 mg by mouth 3 (three) times daily.    Marland Kitchen ezetimibe (ZETIA) 10 MG tablet TAKE 1 TABLET BY MOUTH  DAILY 90 tablet 3  . famotidine (PEPCID)  40 MG tablet TAKE 1 TABLET BY MOUTH EVERY DAY 90 tablet 3  . fluticasone (FLONASE) 50 MCG/ACT nasal spray Place 2 sprays into both nostrils daily. Use for 4-6 weeks then stop and use seasonally or as needed. 16 g 3  . folic acid (FOLVITE) 322 MCG tablet Take 400 mcg by mouth daily. AM    . ibandronate (BONIVA) 150 MG tablet TAKE 1 TABLET BY MOUTH EVERY 30 DAYS WITH FULL GLASS OF WATER    . levothyroxine (SYNTHROID) 25 MCG tablet TAKE  1 TABLET BY MOUTH  DAILY 90 tablet 3  . loratadine (CLARITIN) 10 MG tablet Take 10 mg by mouth daily as needed for allergies.     . magnesium oxide (MAG-OX) 400 MG tablet Take 400 mg by mouth daily.    . niacin (NIASPAN) 500 MG CR tablet Take 750 mg by mouth at bedtime.    . predniSONE (DELTASONE) 5 MG tablet Take 5 mg by mouth daily.    . rosuvastatin (CRESTOR) 20 MG tablet TAKE 1 TABLET BY MOUTH  DAILY 90 tablet 3  . traMADol (ULTRAM) 50 MG tablet Take 1 tablet (50 mg total) by mouth every 6 (six) hours as needed. for pain 120 tablet 2  . triamcinolone ointment (KENALOG) 0.5 % Apply 1 application topically 2 (two) times daily as needed. For up to 2 weeks at a time, then may repeat 30 g 2  . Vitamin D, Cholecalciferol, 400 UNITS CAPS Take 400 Units by mouth daily.     No current facility-administered medications for this visit.     Allergies:   Sulfa antibiotics, Sulfamethoxazole-trimethoprim, Penicillins, Codeine, Prilosec [omeprazole], Tetracycline, and Tetracyclines & related   Social History:  The patient  reports that he quit smoking about 22 years ago. He has quit using smokeless tobacco. He reports current alcohol use of about 3.0 standard drinks of alcohol per week. He reports that he does not use drugs.   Family History:   Family history is unknown by patient.    Review of Systems: Review of Systems  Constitutional: Negative.   Respiratory: Negative.   Cardiovascular: Negative.   Gastrointestinal: Negative.   Musculoskeletal: Positive for joint pain.  Neurological: Negative.   Psychiatric/Behavioral: Negative.   All other systems reviewed and are negative.    PHYSICAL EXAM: VS:  Ht 5' 6"  (1.676 m)   Wt 183 lb 2 oz (83.1 kg)   BMI 29.56 kg/m  , BMI Body mass index is 29.56 kg/m. Constitutional:  oriented to person, place, and time. No distress.  HENT:  Head: Grossly normal Eyes:  no discharge. No scleral icterus.  Neck: No JVD, no carotid bruits  Cardiovascular:  Regular rate and rhythm, 2/6 systolic ejection murmur appreciated right sternal border Pulmonary/Chest: Clear to auscultation bilaterally, no wheezes or rails Abdominal: Soft.  no distension.  no tenderness.  Musculoskeletal: Normal range of motion Neurological:  normal muscle tone. Coordination normal. No atrophy Skin: Skin warm and dry Psychiatric: normal affect, pleasant   Recent Labs: 12/08/2018: ALT 16; BUN 21; Creatinine, Ser 1.22; Hemoglobin 15.1; Platelets 219; Potassium 4.4; Sodium 143; TSH 2.140    Lipid Panel Lab Results  Component Value Date   CHOL 150 12/08/2018   HDL 58 12/08/2018   LDLCALC 56 12/08/2018   TRIG 226 (H) 12/08/2018      Wt Readings from Last 3 Encounters:  06/09/19 183 lb 2 oz (83.1 kg)  06/08/19 182 lb (82.6 kg)  12/08/18 183 lb (83 kg)  ASSESSMENT AND PLAN:  Mixed hyperlipidemia Cholesterol is at goal on the current lipid regimen. 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    Signed, Esmond Plants, M.D., Ph.D. 06/09/2019  [facility], [place]

## 2019-06-08 ENCOUNTER — Ambulatory Visit (INDEPENDENT_AMBULATORY_CARE_PROVIDER_SITE_OTHER): Payer: Managed Care, Other (non HMO) | Admitting: Family Medicine

## 2019-06-08 ENCOUNTER — Other Ambulatory Visit: Payer: Self-pay

## 2019-06-08 ENCOUNTER — Encounter: Payer: Self-pay | Admitting: Family Medicine

## 2019-06-08 ENCOUNTER — Other Ambulatory Visit: Payer: Self-pay | Admitting: Family Medicine

## 2019-06-08 VITALS — BP 145/65 | HR 69 | Temp 97.7°F | Resp 16 | Ht 66.0 in | Wt 182.0 lb

## 2019-06-08 DIAGNOSIS — L570 Actinic keratosis: Secondary | ICD-10-CM | POA: Diagnosis not present

## 2019-06-08 DIAGNOSIS — R7309 Other abnormal glucose: Secondary | ICD-10-CM

## 2019-06-08 DIAGNOSIS — M3130 Wegener's granulomatosis without renal involvement: Secondary | ICD-10-CM

## 2019-06-08 DIAGNOSIS — Z0000 Encounter for general adult medical examination without abnormal findings: Secondary | ICD-10-CM

## 2019-06-08 DIAGNOSIS — R351 Nocturia: Secondary | ICD-10-CM

## 2019-06-08 DIAGNOSIS — N401 Enlarged prostate with lower urinary tract symptoms: Secondary | ICD-10-CM

## 2019-06-08 DIAGNOSIS — E039 Hypothyroidism, unspecified: Secondary | ICD-10-CM

## 2019-06-08 DIAGNOSIS — J320 Chronic maxillary sinusitis: Secondary | ICD-10-CM | POA: Diagnosis not present

## 2019-06-08 DIAGNOSIS — E782 Mixed hyperlipidemia: Secondary | ICD-10-CM

## 2019-06-08 DIAGNOSIS — I10 Essential (primary) hypertension: Secondary | ICD-10-CM

## 2019-06-08 MED ORDER — TRIAMCINOLONE ACETONIDE 0.5 % EX OINT: 1.0000 "application " | TOPICAL_OINTMENT | Freq: Two times a day (BID) | CUTANEOUS | 2 refills | Status: DC | PRN

## 2019-06-08 NOTE — Progress Notes (Signed)
Subjective:    Patient ID: Adrian Zavala, male    DOB: 09-19-1950, 69 y.o.   MRN: [id number]  Adrian Zavala is a 69 y.o. male presenting on 06/08/2019 for Wegener's Granulomatosis    HPI   Wegener's Granulomatsosis / Sinusitis Rheumatology Reported symptoms onset 5 weeks ago with sinus pain and congestion with blood/dark drainage. Then described history of dental problems, described several issues involving loose molar that required extraction, he has had some severe dental jaw pain radiating up to R side sinus and behind R eye as well. Took Advil/Ibuprofen PRN. - Recently he said his sinus congestion became more "clear". He was sent to specialist in [place] last week, and wasn't sure if he needed a root canal they did nerve testing cold/heat/electric test and did not trigger any nerve damage symptoms. They sent him to Rheumatologist. He was placed on Clindamycin at that time until crown replaced. - Now he has done much better since, he did increase Prednisone and increased from 1 tab to 2, then took additional at bedtime, now on 31m daily for about a week, then down to 10 for 1 week then 5 for 1 week. - He will see Dr KDarnelle GoingRheum soon as well for labs  Elevated A1c Last result was 5.6, he has not had recent result. He is doing well however, some weight down overall, not interested in repeat test. Has no new concerns.  [facility] 06/08/2019 12/08/2018 10/27/2018  Decreased Interest 0 0 0  Down, Depressed, Hopeless 0 0 0  PHQ - 2 Score 0 0 0    Social History   Tobacco Use  . Smoking status: Former Smoker    Quit date: 08/24/1996    Years since quitting: 22.8  . Smokeless tobacco: Former UNetwork engineerUse Topics  . Alcohol use: Yes    Alcohol/week: 3.0 standard drinks    Types: 3 Cans of beer per week  . Drug use: No    Review of Systems Per HPI unless specifically indicated above     Objective:    BP (!) 145/65   Pulse 69   Temp 97.7 F (36.5 C)  (Temporal)   Resp 16   Ht 5' 6"  (1.676 m)   Wt 182 lb (82.6 kg)   BMI 29.38 kg/m   Wt Readings from Last 3 Encounters:  06/08/19 182 lb (82.6 kg)  12/08/18 183 lb (83 kg)  05/19/18 185 lb (83.9 kg)    Physical Exam Vitals and nursing note reviewed.  Constitutional:      General: He is not in acute distress.    Appearance: He is well-developed. He is not diaphoretic.     Comments: Well-appearing, comfortable, cooperative  HENT:     Head: Normocephalic and atraumatic.  Eyes:     General:        Right eye: No discharge.        Left eye: No discharge.     Conjunctiva/sclera: Conjunctivae normal.  Neck:     Thyroid: No thyromegaly.  Cardiovascular:     Rate and Rhythm: Normal rate and regular rhythm.     Heart sounds: Murmur (R sided aortic listening post systolic  2/6 murmur, no radiation) present.  Pulmonary:     Effort: Pulmonary effort is normal. No respiratory distress.     Breath sounds: Normal breath sounds. No wheezing or rales.  Musculoskeletal:        General: Normal range of motion.     Cervical  back: Normal range of motion and neck supple.  Lymphadenopathy:     Cervical: No cervical adenopathy.  Skin:    General: Skin is warm and dry.     Findings: No erythema or rash.  Neurological:     Mental Status: He is alert and oriented to person, place, and time.  Psychiatric:        Behavior: Behavior normal.     Comments: Well groomed, good eye contact, normal speech and thoughts       Recent Labs    12/08/18 1143  HGBA1C 5.6    Results for orders placed or performed in visit on 12/08/18  Hemoglobin A1c  Result Value Ref Range   Hgb A1c MFr Bld 5.6 4.8 - 5.6 %   Est. average glucose Bld gHb Est-mCnc 114 mg/dL  CBC with Differential/Platelet  Result Value Ref Range   WBC 10.5 3.4 - 10.8 x10E3/uL   RBC 4.80 4.14 - 5.80 x10E6/uL   Hemoglobin 15.1 13.0 - 17.7 g/dL   Hematocrit 44.4 37.5 - 51.0 %   MCV 93 79 - 97 fL   MCH 31.5 26.6 - 33.0 pg   MCHC 34.0  31.5 - 35.7 g/dL   RDW 12.4 11.6 - 15.4 %   Platelets 219 150 - 450 x10E3/uL   Neutrophils 63 Not Estab. %   Lymphs 21 Not Estab. %   Monocytes 10 Not Estab. %   Eos 4 Not Estab. %   Basos 1 Not Estab. %   Neutrophils Absolute 6.7 1.4 - 7.0 x10E3/uL   Lymphocytes Absolute 2.2 0.7 - 3.1 x10E3/uL   Monocytes Absolute 1.0 (H) 0.1 - 0.9 x10E3/uL   EOS (ABSOLUTE) 0.4 0.0 - 0.4 x10E3/uL   Basophils Absolute 0.1 0.0 - 0.2 x10E3/uL   Immature Granulocytes 1 Not Estab. %   Immature Grans (Abs) 0.1 0.0 - 0.1 x10E3/uL  Lipid panel  Result Value Ref Range   Cholesterol, Total 150 100 - 199 mg/dL   Triglycerides 226 (H) 0 - 149 mg/dL   HDL 58 >39 mg/dL   VLDL Cholesterol Cal 36 5 - 40 mg/dL   LDL Chol Calc (NIH) 56 0 - 99 mg/dL   Chol/HDL Ratio 2.6 0.0 - 5.0 ratio  PSA  Result Value Ref Range   Prostate Specific Ag, Serum 0.7 0.0 - 4.0 ng/mL  Comprehensive metabolic panel  Result Value Ref Range   Glucose 83 65 - 99 mg/dL   BUN 21 8 - 27 mg/dL   Creatinine, Ser 1.22 0.76 - 1.27 mg/dL   GFR calc non Af Amer 61 >59 mL/min/1.73   GFR calc Af Amer 70 >59 mL/min/1.73   BUN/Creatinine Ratio 17 10 - 24   Sodium 143 134 - 144 mmol/L   Potassium 4.4 3.5 - 5.2 mmol/L   Chloride 103 96 - 106 mmol/L   CO2 24 20 - 29 mmol/L   Calcium 10.5 (H) 8.6 - 10.2 mg/dL   Total Protein 7.1 6.0 - 8.5 g/dL   Albumin 4.8 3.8 - 4.8 g/dL   Globulin, Total 2.3 1.5 - 4.5 g/dL   Albumin/Globulin Ratio 2.1 1.2 - 2.2   Bilirubin Total 0.7 0.0 - 1.2 mg/dL   Alkaline Phosphatase 51 39 - 117 IU/L   AST 21 0 - 40 IU/L   ALT 16 0 - 44 IU/L  SAR CoV2 Serology (COVID 19)AB(IGG)IA  Result Value Ref Range   SARS-CoV-2 Ab, IgG Negative Negative  TSH  Result Value Ref Range   TSH 2.140  0.450 - 4.500 uIU/mL  T4, free  Result Value Ref Range   Free T4 1.22 0.82 - 1.77 ng/dL      Assessment & Plan:   Problem List Items Addressed This Visit    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  Also already on Clindamcyin course for dental issue, now upcoming repeat dental apt, no longer thought to need root canal.  Prior A1c 5.6, stable, defer to recheck at this time. Repeat with upcoming labs.  No orders of the defined types were placed in this encounter.    Follow up plan: Return in about 6 months (around 12/09/2019) for 6 month Annual Physical [facility] review.  Future labs ordered [facility] all orders 11/2019  AlNobie PutnamDOMalcolmedical Group 06/08/2019, 9:47 AM

## 2019-06-08 NOTE — Patient Instructions (Addendum)
Thank you for coming to the office today.  Continue with current prednisone taper as you are, and follow up with Rheumatology as planned.  [facility] orders for 6 month.   Please schedule a Follow-up Appointment to: Return in about 6 months (around 12/09/2019) for 6 month Annual Physical [facility] review.  If you have any other questions or concerns, please feel free to call the office or send a message through [place]. You may also schedule an earlier appointment if necessary.  Additionally, you may be receiving a survey about your experience at our office within a few days to 1 week by e-mail or mail. We value your feedback.  [name], DO [place]

## 2019-06-09 ENCOUNTER — Encounter: Payer: Self-pay | Admitting: Cardiovascular Disease

## 2019-06-09 ENCOUNTER — Ambulatory Visit (INDEPENDENT_AMBULATORY_CARE_PROVIDER_SITE_OTHER): Payer: Managed Care, Other (non HMO) | Admitting: Cardiovascular Disease

## 2019-06-09 VITALS — BP 150/82 | HR 73 | Ht 66.0 in | Wt 183.1 lb

## 2019-06-09 DIAGNOSIS — I25118 Atherosclerotic heart disease of native coronary artery with other forms of angina pectoris: Secondary | ICD-10-CM

## 2019-06-09 DIAGNOSIS — E782 Mixed hyperlipidemia: Secondary | ICD-10-CM

## 2019-06-09 DIAGNOSIS — I712 Thoracic aortic aneurysm, without rupture: Secondary | ICD-10-CM

## 2019-06-09 DIAGNOSIS — I7121 Aneurysm of the ascending aorta, without rupture: Secondary | ICD-10-CM

## 2019-06-09 DIAGNOSIS — Q231 Congenital insufficiency of aortic valve: Secondary | ICD-10-CM

## 2019-06-09 DIAGNOSIS — I10 Essential (primary) hypertension: Secondary | ICD-10-CM

## 2019-06-09 DIAGNOSIS — M3130 Wegener's granulomatosis without renal involvement: Secondary | ICD-10-CM

## 2019-06-09 NOTE — Patient Instructions (Addendum)

## 2019-06-16 ENCOUNTER — Ambulatory Visit: Payer: Managed Care, Other (non HMO) | Attending: Internal Medicine

## 2019-06-16 DIAGNOSIS — Z23 Encounter for immunization: Secondary | ICD-10-CM

## 2019-06-16 NOTE — Progress Notes (Signed)
   Covid-19 Vaccination Clinic  Name:  Adrian Zavala    MRN: [id number] DOB: [birth year]  06/16/2019  [name] was observed post Covid-19 immunization for 30 minutes based on pre-vaccination screening without incident. He was provided with Vaccine Information Sheet and instruction to access the V-Safe system.   [name] was instructed to call 911 with any severe reactions post vaccine: Marland Kitchen Difficulty breathing  . Swelling of face and throat  . A fast heartbeat  . A bad rash all over body  . Dizziness and weakness   Immunizations Administered    Name Date Dose VIS Date Route   Moderna COVID-19 Vaccine 06/16/2019 12:56 PM 0.5 mL 02/24/2019 Intramuscular   Manufacturer: Levan Hurst   Lot: [id number]   [place]: [id number]

## 2019-08-12 ENCOUNTER — Telehealth: Payer: Self-pay | Admitting: Family Medicine

## 2019-08-12 DIAGNOSIS — K219 Gastro-esophageal reflux disease without esophagitis: Secondary | ICD-10-CM

## 2019-09-18 ENCOUNTER — Other Ambulatory Visit: Payer: Self-pay | Admitting: Family Medicine

## 2019-09-18 DIAGNOSIS — M1990 Unspecified osteoarthritis, unspecified site: Secondary | ICD-10-CM

## 2019-09-18 NOTE — Telephone Encounter (Signed)
Requested medication (s) are due for refill today: Yes  Requested medication (s) are on the active medication list: Yes  Last refill:  04/16/19  Future visit scheduled: Yes  Notes to clinic:  See request.    Requested Prescriptions  Pending Prescriptions Disp Refills   traMADol (ULTRAM) 50 MG tablet [Pharmacy Med Name: TRAMADOL 50MG TABLETS] 120 tablet     Sig: TAKE 1 TABLET(50 MG) BY MOUTH EVERY 6 HOURS AS NEEDED FOR PAIN      Not Delegated - Analgesics:  Opioid Agonists Failed - 09/18/2019  1:58 PM      Failed - This refill cannot be delegated      Failed - Urine Drug Screen completed in last 360 days.      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

## 2019-10-28 LAB — LIPID PANEL
Cholesterol: 174 (ref 0–200)
HDL: 54 (ref 35–70)
LDL Cholesterol: 81
Triglycerides: 237 — AB (ref 40–160)

## 2019-10-28 LAB — NOVEL CORONAVIRUS, NAA

## 2019-10-28 LAB — BASIC METABOLIC PANEL
Creatinine: 1.1 (ref 0.6–1.3)
Glucose: 83

## 2019-10-28 LAB — HEMOGLOBIN A1C: Hemoglobin A1C: 5.7

## 2019-10-31 ENCOUNTER — Other Ambulatory Visit: Payer: Self-pay | Admitting: Family Medicine

## 2019-10-31 DIAGNOSIS — K219 Gastro-esophageal reflux disease without esophagitis: Secondary | ICD-10-CM

## 2019-10-31 NOTE — Telephone Encounter (Signed)
Requested Prescriptions  Pending Prescriptions Disp Refills  . 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

## 2019-12-09 ENCOUNTER — Other Ambulatory Visit: Payer: Self-pay

## 2019-12-09 ENCOUNTER — Encounter: Payer: Self-pay | Admitting: Family Medicine

## 2019-12-09 ENCOUNTER — Ambulatory Visit (INDEPENDENT_AMBULATORY_CARE_PROVIDER_SITE_OTHER): Payer: Managed Care, Other (non HMO) | Admitting: Family Medicine

## 2019-12-09 VITALS — BP 125/75 | HR 74 | Temp 97.5°F | Resp 16 | Ht 66.0 in | Wt 179.0 lb

## 2019-12-09 DIAGNOSIS — E782 Mixed hyperlipidemia: Secondary | ICD-10-CM

## 2019-12-09 DIAGNOSIS — M1990 Unspecified osteoarthritis, unspecified site: Secondary | ICD-10-CM

## 2019-12-09 DIAGNOSIS — N401 Enlarged prostate with lower urinary tract symptoms: Secondary | ICD-10-CM

## 2019-12-09 DIAGNOSIS — Z0000 Encounter for general adult medical examination without abnormal findings: Secondary | ICD-10-CM | POA: Diagnosis not present

## 2019-12-09 DIAGNOSIS — E039 Hypothyroidism, unspecified: Secondary | ICD-10-CM

## 2019-12-09 DIAGNOSIS — Z23 Encounter for immunization: Secondary | ICD-10-CM

## 2019-12-09 DIAGNOSIS — I7121 Aneurysm of the ascending aorta, without rupture: Secondary | ICD-10-CM

## 2019-12-09 DIAGNOSIS — I10 Essential (primary) hypertension: Secondary | ICD-10-CM

## 2019-12-09 DIAGNOSIS — R351 Nocturia: Secondary | ICD-10-CM

## 2019-12-09 DIAGNOSIS — R7309 Other abnormal glucose: Secondary | ICD-10-CM

## 2019-12-09 DIAGNOSIS — I712 Thoracic aortic aneurysm, without rupture: Secondary | ICD-10-CM

## 2019-12-09 MED ORDER — TRAMADOL HCL 50 MG PO TABS: 50.0000 mg | ORAL_TABLET | Freq: Four times a day (QID) | ORAL | 2 refills | Status: DC | PRN

## 2019-12-09 NOTE — Progress Notes (Signed)
Subjective:    Patient ID: Adrian Zavala, male    DOB: March 10, 1951, 69 y.o.   MRN: [id number]  Adrian Zavala is a 69 y.o. male presenting on 12/09/2019 for Hypertension   HPI   Here for Annual Physical and Lab fasting orders.  Physical Currently doing well. Without new concerns. Reviewed diet and lifestyle current plans  Hyperlipidemia - Followed by Cardiology - He is on Zetia and Crestor  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, hands, hips) Dr Candelaria Stagers Sam[facility] Si / [facility] He had x-rays C/T spine, showed OA/DJD He requests a Tramadol, dose increase from 2 a day to 3 a day, improves his pain and function, he has 120 pills per fill and was only taking it twice a day lasting about 60 days, now he asks if he can take more, as prescribed - He has chronic back pain with DJD bone spurs and has curvature of spine, worse with prolonged sitting at work, causing worse back pain, followed by Rheumatology - Taking Tylenol Arthritis 678m x1per dose in afternoon and evening with some relief - On Tizanidine half of 455mTID PRN - Worse if turn neck to the R has grinding in neck - In past he had some issues with excessive walking limiting him and pain - Reports chronic problem with chronic joint pain and stiffness from arthritis. 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, has had some hand and finger joint deformity due to arthritis   Health Maintenance: [place], will receive today high dose  Colon CA Screening: Last Colonoscopy 10/25/14 (done by [facility] - Dr WoRob Bunting results with polyp found to be hyperplastic, good for 10 years. Currently asymptomatic. No known family history of colon CA.  UTD COVID19 vaccine, Moderna 2/22 and 3/23  [facility] 06/08/2019 12/08/2018 10/27/2018  Decreased Interest 0 0 0  Down, Depressed, Hopeless 0 0 0  PHQ - 2 Score 0 0 0    Past  Medical History:  Diagnosis Date  . Arthritis    Spine, Left Hip, Hands  . 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[place]  . GERD (gastroesophageal reflux disease)   . Heart murmur   . Hypercholesteremia   . Hypothyroidism    graves  . Lung disorder    S/P chemo for Wegener's  . Neuromuscular disorder ([place]   some leg weakness S/P Chemo for Wegener's  . Osteopenia   . Osteoporosis    Femur  . Seasonal allergies   . 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[place]   Past Surgical History:  Procedure Laterality Date  . CHOLECYSTECTOMY     has "metal sutures"   . COLONOSCOPY N/A 10/25/2014   Procedure: COLONOSCOPY;  Surgeon: DaLucilla LameMD;  [place] Service: Gastroenterology;  Laterality: N/A;  . TREATMENT FISTULA ANAL     closure with rectal advancement flap   Social History   Socioeconomic History  . Marital status: Married    Spouse name: JoMarkon Jares. Number of children: Not on file  . Years of education: Not on file  . Highest education level: Not on file  Occupational History  . 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 Smoking status: Former Smoker    Quit date: 08/24/1996    Years since quitting: 23.3  . Smokeless tobacco: Former [employer] engine[employer] Sexual Activity  . Alcohol use: Yes    Alcohol/week: 3.0 standard drinks  Types: 3 Cans of beer per week  . Drug use: No  . Sexual activity: Not on file  Other Topics Concern  . Not on file  Social History Narrative  . Not on file   Social Determinants of Health   Financial Resource Strain:   . Difficulty of Paying Living Expenses: Not on file  Food Insecurity:   . Worried About Charity fundraiser in the Last Year: Not on file  . Ran Out of Food in the Last Year: Not on file  Transportation Needs:   . Lack of Transportation (Medical): Not on file  . Lack of Transportation (Non-Medical): Not on file  Physical Activity:   . Days of Exercise per Week: Not on file  . Minutes of Exercise per  Session: Not on file  Stress:   . Feeling of Stress : Not on file  Social Connections:   . Frequency of Communication with Friends and Family: Not on file  . Frequency of Social Gatherings with Friends and Family: Not on file  . Attends Religious Services: Not on file  . Active Member of Clubs or Organizations: Not on file  . Attends Archivist Meetings: Not on file  . Marital Status: Not on file  Intimate Partner Violence:   . Fear of Current or Ex-Partner: Not on file  . Emotionally Abused: Not on file  . Physically Abused: Not on file  . Sexually Abused: Not on file   Family History  Family history unknown: Yes   Current Outpatient Medications on File Prior to Visit  Medication Sig  . acetaminophen (TYLENOL) 650 MG CR tablet Take 350 mg by mouth every 8 (eight) hours as needed for pain or fever.   Marland Kitchen aspirin 81 MG tablet Take 81 mg by mouth daily. AM  . B Complex-C-Folic Acid (B-COMPLEX BALANCED PO) Take 1 tablet by mouth daily.  . calcium carbonate (OS-CAL) 600 MG TABS tablet Take 600 mg by mouth daily. AM  . ezetimibe (ZETIA) 10 MG tablet TAKE 1 TABLET BY MOUTH  DAILY  . famotidine (PEPCID) 40 MG tablet TAKE 1 TABLET BY MOUTH  DAILY  . fluticasone (FLONASE) 50 MCG/ACT nasal spray Place 2 sprays into both nostrils daily. Use for 4-6 weeks then stop and use seasonally or as needed.  . folic acid (FOLVITE) 032 MCG tablet Take 400 mcg by mouth daily. AM  . ibandronate (BONIVA) 150 MG tablet TAKE 1 TABLET BY MOUTH EVERY 30 DAYS WITH FULL GLASS OF WATER  . levothyroxine (SYNTHROID) 25 MCG tablet TAKE 1 TABLET BY MOUTH  DAILY  . loratadine (CLARITIN) 10 MG tablet Take 10 mg by mouth daily as needed for allergies.   . magnesium oxide (MAG-OX) 400 MG tablet Take 400 mg by mouth daily.  . niacin (NIASPAN) 500 MG CR tablet Take 750 mg by mouth at bedtime.  . predniSONE (DELTASONE) 5 MG tablet Take 5 mg by mouth daily.  . rosuvastatin (CRESTOR) 20 MG tablet TAKE 1 TABLET BY MOUTH   DAILY  . tiZANidine (ZANAFLEX) 4 MG tablet Take 2 mg by mouth 3 (three) times daily as needed.  . triamcinolone ointment (KENALOG) 0.5 % Apply 1 application topically 2 (two) times daily as needed. For up to 2 weeks at a time, then may repeat  . Vitamin D, Cholecalciferol, 400 UNITS CAPS Take 400 Units by mouth daily.  Marland Kitchen gabapentin (NEURONTIN) 100 MG capsule    No current facility-administered medications on file prior to visit.  Review of Systems  Constitutional: Negative for activity change, appetite change, chills, diaphoresis, fatigue and fever.  HENT: Negative for congestion and hearing loss.   Eyes: Negative for visual disturbance.  Respiratory: Negative for apnea, cough, chest tightness, shortness of breath and wheezing.   Cardiovascular: Negative for chest pain, palpitations and leg swelling.  Gastrointestinal: Negative for abdominal pain, anal bleeding, blood in stool, constipation, diarrhea, nausea and vomiting.  Endocrine: Negative for cold intolerance.  Genitourinary: Negative for difficulty urinating, dysuria, frequency and hematuria.  Musculoskeletal: Negative for arthralgias, back pain and neck pain.  Skin: Negative for rash.  Allergic/Immunologic: Negative for environmental allergies.  Neurological: Negative for dizziness, weakness, light-headedness, numbness and headaches.  Hematological: Negative for adenopathy.  Psychiatric/Behavioral: Negative for behavioral problems, dysphoric mood and sleep disturbance. The patient is not nervous/anxious.    Per HPI unless specifically indicated above      Objective:    BP 125/75   Pulse 74   Temp (!) 97.5 F (36.4 C) (Temporal)   Resp 16   Ht 5' 6"  (1.676 m)   Wt 179 lb (81.2 kg)   SpO2 98%   BMI 28.89 kg/m   Wt Readings from Last 3 Encounters:  12/09/19 179 lb (81.2 kg)  06/09/19 183 lb 2 oz (83.1 kg)  06/08/19 182 lb (82.6 kg)    Physical Exam Vitals and nursing note reviewed.  Constitutional:      General:  He is not in acute distress.    Appearance: He is well-developed. He is not diaphoretic.     Comments: Well-appearing, comfortable, cooperative  HENT:     Head: Normocephalic and atraumatic.  Eyes:     General:        Right eye: No discharge.        Left eye: No discharge.     Conjunctiva/sclera: Conjunctivae normal.     Pupils: Pupils are equal, round, and reactive to light.  Neck:     Thyroid: No thyromegaly.  Cardiovascular:     Rate and Rhythm: Normal rate and regular rhythm.     Heart sounds: Normal heart sounds. No murmur heard.   Pulmonary:     Effort: Pulmonary effort is normal. No respiratory distress.     Breath sounds: Normal breath sounds. No wheezing or rales.  Abdominal:     General: Bowel sounds are normal. There is no distension.     Palpations: Abdomen is soft. There is no mass.     Tenderness: There is no abdominal tenderness.  Musculoskeletal:        General: No tenderness. Normal range of motion.     Cervical back: Normal range of motion and neck supple.     Right lower leg: No edema.     Left lower leg: No edema.     Comments: Upper / Lower Extremities: - Normal muscle tone, strength bilateral upper extremities 5/5, lower extremities 5/5  Lymphadenopathy:     Cervical: No cervical adenopathy.  Skin:    General: Skin is warm and dry.     Findings: No erythema or rash.  Neurological:     Mental Status: He is alert and oriented to person, place, and time.     Comments: Distal sensation intact to light touch all extremities  Psychiatric:        Behavior: Behavior normal.     Comments: Well groomed, good eye contact, normal speech and thoughts      I have personally reviewed the radiology report from 11/12/19 on  X-ray C / T spine.   X-ray cervical spine 4 to 5 views  Impression  Degenerative disc disease Narrative   COMPARISON:  No comparisons   FINDINGS:  Osteopenia. Degenerative disc changes, most prominent at C5-6 with  anterior and  posterior osteophytes. mild foraminal degenerative changes   --------  X-ray thoracic spine AP and lateral  Impression  Degenerative disc disease Narrative   COMPARISON:  No comparisons   FINDINGS:  Osteopenia. No definite compression fracture. Mild diffuse degenerative  changes with lateral osteophytes. Some anterior osteophytes mid thoracic  spine Specimen Collected: -- Last Resulted: 11/12/19 4:32 PM  Received From: [facility]  Result Received: 12/09/19 8:07 AM     Results for orders placed or performed in visit on 12/08/18  Hemoglobin A1c  Result Value Ref Range   Hgb A1c MFr Bld 5.6 4.8 - 5.6 %   Est. average glucose Bld gHb Est-mCnc 114 mg/dL  CBC with Differential/Platelet  Result Value Ref Range   WBC 10.5 3.4 - 10.8 x10E3/uL   RBC 4.80 4.14 - 5.80 x10E6/uL   Hemoglobin 15.1 13.0 - 17.7 g/dL   Hematocrit 44.4 37.5 - 51.0 %   MCV 93 79 - 97 fL   MCH 31.5 26.6 - 33.0 pg   MCHC 34.0 31 - 35 g/dL   RDW 12.4 11.6 - 15.4 %   Platelets 219 150 - 450 x10E3/uL   Neutrophils 63 Not Estab. %   Lymphs 21 Not Estab. %   Monocytes 10 Not Estab. %   Eos 4 Not Estab. %   Basos 1 Not Estab. %   Neutrophils Absolute 6.7 1 - 7 x10E3/uL   Lymphocytes Absolute 2.2 0 - 3 x10E3/uL   Monocytes Absolute 1.0 (H) 0 - 0 x10E3/uL   EOS (ABSOLUTE) 0.4 0.0 - 0.4 x10E3/uL   Basophils Absolute 0.1 0 - 0 x10E3/uL   Immature Granulocytes 1 Not Estab. %   Immature Grans (Abs) 0.1 0.0 - 0.1 x10E3/uL  Lipid panel  Result Value Ref Range   Cholesterol, Total 150 100 - 199 mg/dL   Triglycerides 226 (H) 0 - 149 mg/dL   HDL 58 >39 mg/dL   VLDL Cholesterol Cal 36 5 - 40 mg/dL   LDL Chol Calc (NIH) 56 0 - 99 mg/dL   Chol/HDL Ratio 2.6 0.0 - 5.0 ratio  PSA  Result Value Ref Range   Prostate Specific Ag, Serum 0.7 0.0 - 4.0 ng/mL  Comprehensive metabolic panel  Result Value Ref Range   Glucose 83 65 - 99 mg/dL   BUN 21 8 - 27 mg/dL   Creatinine, Ser 1.22 0.76 -  1.27 mg/dL   GFR calc non Af Amer 61 >59 mL/min/1.73   GFR calc Af Amer 70 >59 mL/min/1.73   BUN/Creatinine Ratio 17 10 - 24   Sodium 143 134 - 144 mmol/L   Potassium 4.4 3.5 - 5.2 mmol/L   Chloride 103 96 - 106 mmol/L   CO2 24 20 - 29 mmol/L   Calcium 10.5 (H) 8.6 - 10.2 mg/dL   Total Protein 7.1 6.0 - 8.5 g/dL   Albumin 4.8 3.8 - 4.8 g/dL   Globulin, Total 2.3 1.5 - 4.5 g/dL   Albumin/Globulin Ratio 2.1 1.2 - 2.2   Bilirubin Total 0.7 0.0 - 1.2 mg/dL   Alkaline Phosphatase 51 39 - 117 IU/L   AST 21 0 - 40 IU/L   ALT 16 0 - 44 IU/L  SAR CoV2 Serology (COVID 19)AB(IGG)IA  Result  Value Ref Range   SARS-CoV-2 Ab, IgG Negative Negative  TSH  Result Value Ref Range   TSH 2.140 0.450 - 4.500 uIU/mL  T4, free  Result Value Ref Range   Free T4 1.22 0.82 - 1.77 ng/dL      Assessment & Plan:   Problem List Items Addressed This Visit    Hypothyroidism    Previously controlled hypothyroidism Continue current Levothyroxine 14mg daily Will re-check TSH Free T4 - printed [facility] orders      Relevant Orders   TSH   T4, free   HLD (hyperlipidemia)    controlled cholesterol on statin and lifestyle except high TG  Plan: 1. Continue current meds - Rosuvastatin 223mdaily, Zetia 1059maily 2. Continue ASA 39m74mr primary ASCVD risk reduction 3. Encourage improved lifestyle - low carb/cholesterol, reduce portion size, continue improving regular exercise as tolerated       Relevant Orders   Comprehensive metabolic panel   Essential hypertension    Well-controlled HTN No known complications    Plan:  1. Remain off anti HTN therapy 2. Encourage improved lifestyle - low sodium diet, regular exercise 3. Continue monitor BP outside office, bring readings to next visit, if persistently >140/90 or new symptoms notify office sooner      Relevant Orders   CBC with Differential/Platelet   Comprehensive metabolic panel   Elevated hemoglobin A1c    A1c 5.7 on [facility] results  provided Encourage lifestyle diet exercise      BPH (benign prostatic hyperplasia)    BPH Limited symptoms Check PSA      Relevant Orders   PSA   Ascending aortic aneurysm (HCC)[place] Followed by Cardiology No new concerns.       Other Visit Diagnoses    Annual physical exam    -  Primary   Relevant Orders   CBC with Differential/Platelet   PSA   TSH   Comprehensive metabolic panel   Needs flu shot       Relevant Orders   Flu Vaccine QUAD High Dose(Fluad) (Completed)   Arthritis       Relevant Medications   tiZANidine (ZANAFLEX) 4 MG tablet   traMADol (ULTRAM) 50 MG tablet      Updated Health Maintenance information - High dose flu shot today - Next colonoscopy 2026 Reviewed recent lab results with patient Encouraged improvement to lifestyle with diet and exercise - Goal of weight loss  Orders Placed This Encounter  Procedures  . Novel Coronavirus, NAA ([facility])    This external order was created through the Results Console.    Order Specific Question:   Previously tested for COVID-19    Answer:   No  . Flu Vaccine QUAD High Dose(Fluad)  . CBC with Differential/Platelet    Standing Status:   Future    Number of Occurrences:   1    Standing Expiration Date:   12/08/2020  . PSA    Standing Status:   Future    Number of Occurrences:   1    Standing Expiration Date:   12/08/2020  . TSH    Standing Status:   Future    Number of Occurrences:   1    Standing Expiration Date:   12/08/2020  . T4, free    Standing Status:   Future    Number of Occurrences:   1    Standing Expiration Date:   12/08/2020  . Comprehensive metabolic panel    Standing Status:  Future    Number of Occurrences:   1    Standing Expiration Date:   12/08/2020    Order Specific Question:   Has the patient fasted?    Answer:   Yes  . Basic metabolic panel    This external order was created through the Results Console.  . Lipid panel    This external order was created through the Results  Console.  . Hemoglobin A1c    This external order was created through the Results Console.     Meds ordered this encounter  Medications  . traMADol (ULTRAM) 50 MG tablet    Sig: Take 1 tablet (50 mg total) by mouth every 6 (six) hours as needed for moderate pain or severe pain.    Dispense:  120 tablet    Refill:  2      Follow up plan: Return in about 6 months (around 06/07/2020) for 6 month Arthritis/Pain, med  follow-up.  [name], [facility] 12/09/2019, 8:44 AM

## 2019-12-09 NOTE — Assessment & Plan Note (Addendum)
Previously controlled hypothyroidism Continue current Levothyroxine 18mg daily Will re-check TSH Free T4 - printed [facility] orders

## 2019-12-09 NOTE — Assessment & Plan Note (Signed)
A1c 5.7 on [facility] results provided Encourage lifestyle diet exercise

## 2019-12-09 NOTE — Assessment & Plan Note (Signed)
BPH Limited symptoms Check PSA

## 2019-12-09 NOTE — Assessment & Plan Note (Signed)
Followed by Cardiology No new concerns.

## 2019-12-09 NOTE — Patient Instructions (Addendum)
Thank you for coming to the office today.  Increase Tramadol to 3 times a day, should have plenty per month.  Next colonoscopy 2026  Flu shot today  [facility] orders  Please schedule a Follow-up Appointment to: Return in about 6 months (around 06/07/2020) for 6 month Arthritis/Pain, med  follow-up.  If you have any other questions or concerns, please feel free to call the office or send a message through [place]. You may also schedule an earlier appointment if necessary.  Additionally, you may be receiving a survey about your experience at our office within a few days to 1 week by e-mail or mail. We value your feedback.  [name], DO [place]

## 2019-12-09 NOTE — Assessment & Plan Note (Signed)
controlled cholesterol on statin and lifestyle except high TG  Plan: 1. Continue current meds - Rosuvastatin 38m daily, Zetia 163mdaily 2. Continue ASA 813mor primary ASCVD risk reduction 3. Encourage improved lifestyle - low carb/cholesterol, reduce portion size, continue improving regular exercise as tolerated

## 2019-12-09 NOTE — Assessment & Plan Note (Signed)
Well-controlled HTN No known complications    Plan:  1. Remain off anti HTN therapy 2. Encourage improved lifestyle - low sodium diet, regular exercise 3. Continue monitor BP outside office, bring readings to next visit, if persistently >140/90 or new symptoms notify office sooner

## 2019-12-10 LAB — COMPREHENSIVE METABOLIC PANEL
ALT: 21 IU/L (ref 0–44)
AST: 18 IU/L (ref 0–40)
Albumin/Globulin Ratio: 2.3 — ABNORMAL HIGH (ref 1.2–2.2)
Albumin: 4.9 g/dL — ABNORMAL HIGH (ref 3.8–4.8)
Alkaline Phosphatase: 66 IU/L (ref 44–121)
BUN/Creatinine Ratio: 24 (ref 10–24)
BUN: 28 mg/dL — ABNORMAL HIGH (ref 8–27)
Bilirubin Total: 0.6 mg/dL (ref 0.0–1.2)
CO2: 23 mmol/L (ref 20–29)
Calcium: 10.5 mg/dL — ABNORMAL HIGH (ref 8.6–10.2)
Chloride: 104 mmol/L (ref 96–106)
Creatinine, Ser: 1.16 mg/dL (ref 0.76–1.27)
GFR calc Af Amer: 74 mL/min/{1.73_m2} (ref >59)
GFR calc non Af Amer: 64 mL/min/{1.73_m2} (ref >59)
Globulin, Total: 2.1 g/dL (ref 1.5–4.5)
Glucose: 87 mg/dL (ref 65–99)
Potassium: 4.9 mmol/L (ref 3.5–5.2)
Sodium: 141 mmol/L (ref 134–144)
Total Protein: 7 g/dL (ref 6.0–8.5)

## 2019-12-10 LAB — CBC WITH DIFFERENTIAL/PLATELET
Basophils Absolute: 0.1 10*3/uL (ref 0.0–0.2)
Basos: 1 %
EOS (ABSOLUTE): 0.2 10*3/uL (ref 0.0–0.4)
Eos: 2 %
Hematocrit: 45.8 % (ref 37.5–51.0)
Hemoglobin: 15.5 g/dL (ref 13.0–17.7)
Immature Grans (Abs): 0.1 10*3/uL (ref 0.0–0.1)
Immature Granulocytes: 1 %
Lymphocytes Absolute: 1.5 10*3/uL (ref 0.7–3.1)
Lymphs: 14 %
MCH: 32.7 pg (ref 26.6–33.0)
MCHC: 33.8 g/dL (ref 31.5–35.7)
MCV: 97 fL (ref 79–97)
Monocytes Absolute: 0.9 10*3/uL (ref 0.1–0.9)
Monocytes: 8 %
Neutrophils Absolute: 8.1 10*3/uL — ABNORMAL HIGH (ref 1.4–7.0)
Neutrophils: 74 %
Platelets: 215 10*3/uL (ref 150–450)
RBC: 4.74 x10E6/uL (ref 4.14–5.80)
RDW: 12.3 % (ref 11.6–15.4)
WBC: 10.9 10*3/uL — ABNORMAL HIGH (ref 3.4–10.8)

## 2019-12-10 LAB — PSA: Prostate Specific Ag, Serum: 0.5 ng/mL (ref 0.0–4.0)

## 2019-12-10 LAB — TSH: TSH: 1.09 u[IU]/mL (ref 0.450–4.500)

## 2019-12-10 LAB — T4, FREE: Free T4: 1.21 ng/dL (ref 0.82–1.77)

## 2020-01-05 ENCOUNTER — Other Ambulatory Visit: Payer: Self-pay | Admitting: Neurology

## 2020-01-05 DIAGNOSIS — R9089 Other abnormal findings on diagnostic imaging of central nervous system: Secondary | ICD-10-CM

## 2020-01-05 DIAGNOSIS — G500 Trigeminal neuralgia: Secondary | ICD-10-CM

## 2020-01-08 ENCOUNTER — Telehealth: Payer: Self-pay

## 2020-01-08 ENCOUNTER — Ambulatory Visit: Payer: Self-pay | Admitting: Family Medicine

## 2020-01-08 NOTE — Telephone Encounter (Signed)
Called regarding spouse for high priority message from  [place]: Attn. Dr. Raliegh Ip; pt. Called with report of taking Tramadol 50 mg. X 2 , by accident, this AM. Nurse Triage has made 3 attempts to reach pt., and no answer.   He is at work and asymptomatic explain spouse when to seek medical care verbalized understanding.

## 2020-01-08 NOTE — Telephone Encounter (Signed)
That is fine. Taking 2 tramadol 51m = 1073mfor one dose at once is okay. That is an acceptable dose. He should limit future doses today but it could be taken up to max 1 pill 3-4 times a day in 24 hours, so this should be fine.  Adrian PutnamDO [facility] 01/08/2020, 10:35 AM

## 2020-01-08 NOTE — Telephone Encounter (Signed)
That is fine. Taking 2 tramadol 33m = 1025mfor one dose at once is okay. That is an acceptable dose. He should limit future doses today but it could be taken up to max 1 pill 3-4 times a day in 24 hours, so this should be fine.  AlNobie PutnamDO [place] 01/08/2020, 10:35 AM

## 2020-01-08 NOTE — Telephone Encounter (Signed)
Third attempt to reach patient by phone; pt. Did not answer.  Left vm to return call to office to speak to a nurse about his symptoms.    Will forward this message to the PCP to make aware of pt's phone call, and inability to contact him by phone.

## 2020-01-08 NOTE — Telephone Encounter (Signed)
Second attempted to reach patient- left message to call back.

## 2020-01-08 NOTE — Telephone Encounter (Signed)
Message from Sharene Skeans sent at 01/08/2020 8:37 AM EDT  Summary: medication dose advice    Pt took two tramadol 40m by accident this morning and wanted advice on what to do/please advise

## 2020-01-08 NOTE — Telephone Encounter (Signed)
Patient advised.

## 2020-01-08 NOTE — Telephone Encounter (Signed)
Pt. Called back and states "I feel fine and wanted to let the doctor now." States he took the extra dose at 0730 this morning.

## 2020-01-11 ENCOUNTER — Other Ambulatory Visit: Payer: Self-pay | Admitting: Neurology

## 2020-01-11 DIAGNOSIS — I6501 Occlusion and stenosis of right vertebral artery: Secondary | ICD-10-CM

## 2020-01-11 DIAGNOSIS — G500 Trigeminal neuralgia: Secondary | ICD-10-CM

## 2020-01-14 ENCOUNTER — Other Ambulatory Visit: Payer: Self-pay | Admitting: Neurology

## 2020-01-14 DIAGNOSIS — G500 Trigeminal neuralgia: Secondary | ICD-10-CM

## 2020-02-03 ENCOUNTER — Other Ambulatory Visit: Payer: Managed Care, Other (non HMO)

## 2020-02-05 ENCOUNTER — Other Ambulatory Visit: Payer: Self-pay | Admitting: Neurology

## 2020-02-05 DIAGNOSIS — G500 Trigeminal neuralgia: Secondary | ICD-10-CM

## 2020-02-05 DIAGNOSIS — R9089 Other abnormal findings on diagnostic imaging of central nervous system: Secondary | ICD-10-CM

## 2020-02-19 ENCOUNTER — Other Ambulatory Visit: Payer: Self-pay

## 2020-02-19 ENCOUNTER — Ambulatory Visit
Admission: RE | Admit: 2020-02-19 | Discharge: 2020-02-19 | Disposition: A | Discharge status: Home / Self Care | Payer: Managed Care, Other (non HMO) | Source: Ambulatory Visit | Attending: Neurology | Admitting: Neurology

## 2020-02-19 DIAGNOSIS — G500 Trigeminal neuralgia: Secondary | ICD-10-CM | POA: Diagnosis not present

## 2020-02-19 DIAGNOSIS — R9089 Other abnormal findings on diagnostic imaging of central nervous system: Secondary | ICD-10-CM | POA: Insufficient documentation

## 2020-02-19 MED ORDER — GADOBUTROL 1 MMOL/ML IV SOLN
7.5000 mL | Freq: Once | INTRAVENOUS | Status: AC | PRN
  Administered 2020-02-19: 7.5 mL via INTRAVENOUS

## 2020-03-05 IMAGING — MR MR MRA HEAD W/O CM
17 of 19 series · 33 of 48 positions shown · IV contrast (multihance)
Comparison: CT HEAD March 05, 2013

CLINICAL DATA: Shaking, hot flashes, RIGHT arm numbness. History of
Erxleben, Ferienhaus syndrome.

EXAM:
MRI HEAD WITHOUT AND WITH CONTRAST
MRA HEAD WITHOUT CONTRAST
MRA NECK WITHOUT AND WITH CONTRAST
TECHNIQUE: Multiplanar, multiecho pulse sequences of the brain and surrounding
structures were obtained with and without intravenous contrast.
Angiographic images of the Circle of Willis were obtained using MRA
technique without intravenous contrast. Angiographic images of the
neck were obtained using MRA technique without and with intravenous
contrast. Carotid stenosis measurements (when applicable) are
obtained utilizing NASCET criteria, using the distal internal
carotid diameter as the denominator.
CONTRAST:  15mL MULTIHANCE GADOBENATE DIMEGLUMINE 529 MG/ML IV SOLN

[Series 2: ax dwi_tracew · axial · 3.0mm · 0.83mm/px · z∈[-3,+160]mm · 3 of 57 slices shown (1 of 2)]
[im 1/57]
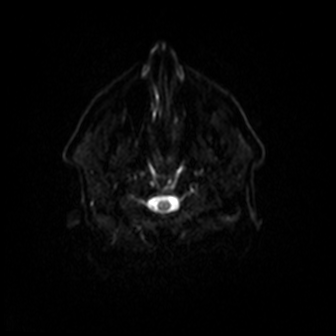
[im 29/57]
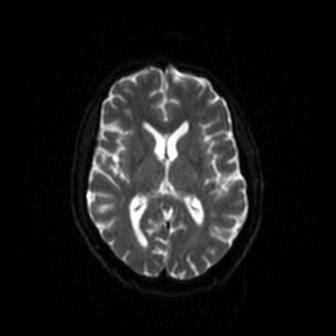
[im 57/57]
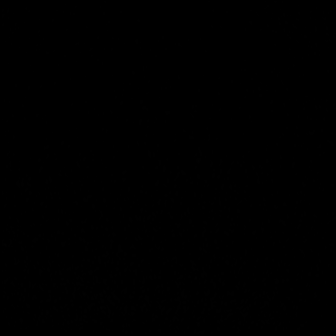

[Series 2: ax dwi_tracew · axial · 3.0mm · 0.83mm/px · z∈[-3,+157]mm · 2 of 56 slices shown (2 of 2)]
[im 1/56]
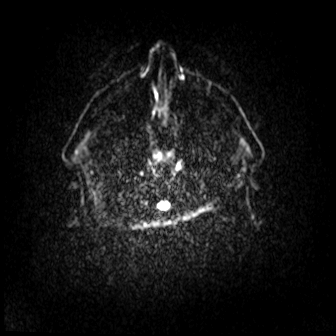
[im 56/56]
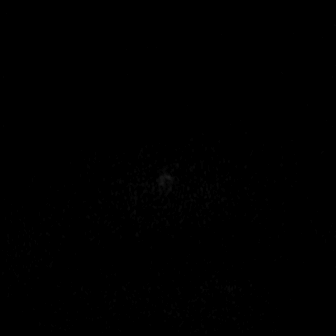

[Series 3: ax dwi_adc · axial · 3.0mm · 0.83mm/px · z∈[-3,+154]mm · 2 of 55 slices shown]
[im 1/55]
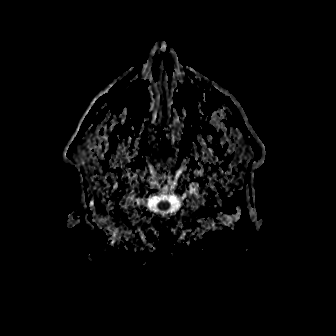
[im 55/55]
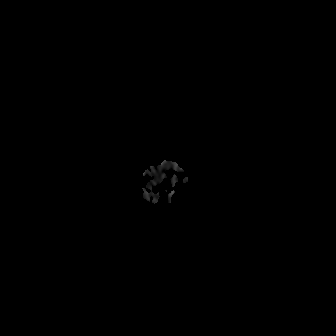

[Series 4: cor dwi_tracew · coronal · 5.0mm · 0.68mm/px · 2 of 40 slices shown (1 of 2)]
[im 1/40]
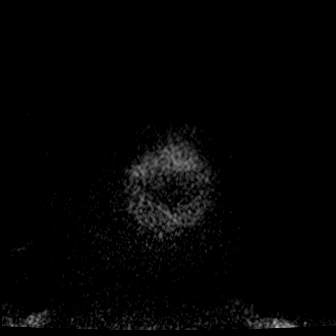
[im 40/40]
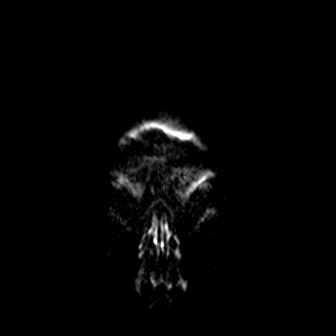

[Series 4: cor dwi_tracew · coronal · 5.0mm · 0.68mm/px · 2 of 40 slices shown (2 of 2)]
[im 1/40]
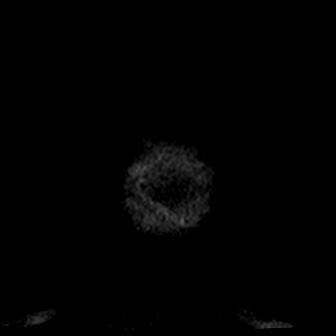
[im 40/40]
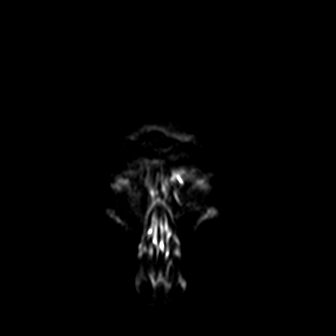

[Series 5: cor dwi_adc · coronal · 5.0mm · 0.68mm/px · 2 of 40 slices shown]
[im 1/40]
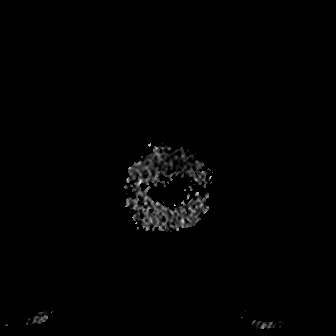
[im 40/40]
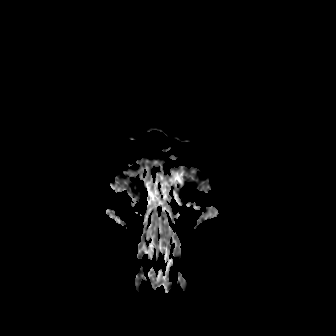

[Series 11: FLAIR · axial · 5.0mm · 1.20mm/px · 1 of 28 slices shown]
[im 1/28]
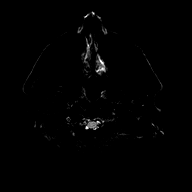

[Series 12: T2-star · axial · 5.0mm · 0.45mm/px · 1 of 28 slices shown]
[im 1/28]
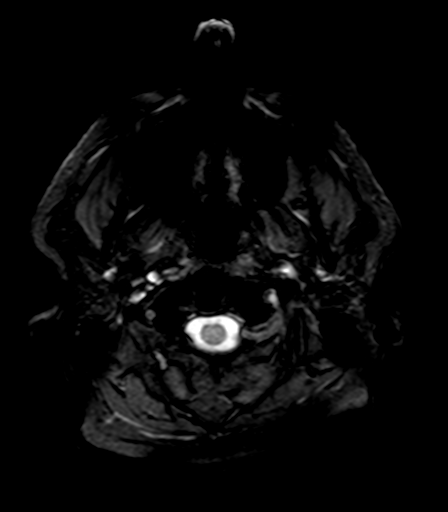

[Series 13: T2 · axial · 5.0mm · 0.45mm/px · 1 of 28 slices shown (1 of 2)]
[im 1/28]
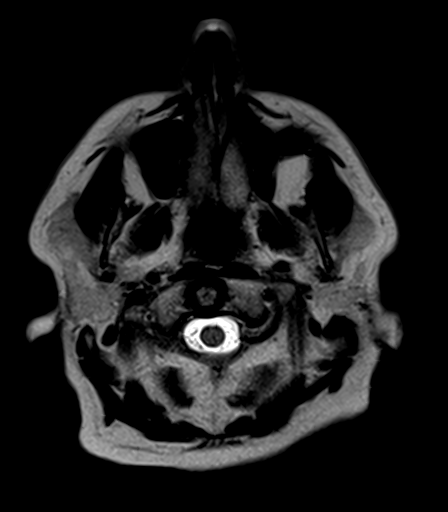

[Series 14: T1 · sagittal · 5.0mm · 0.94mm/px · 1 of 23 slices shown (1 of 2)]
[im 1/23]
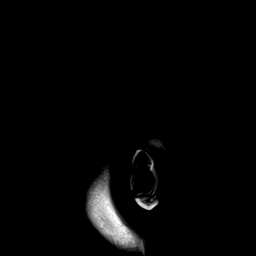

[Series 15: T1 · axial · 5.0mm · 0.90mm/px · 1 of 28 slices shown (2 of 2)]
[im 1/28]
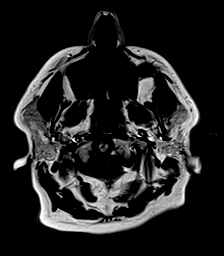

[Series 16: T2 · coronal · 5.0mm · 0.45mm/px · 1 of 35 slices shown (2 of 2)]
[im 1/35]
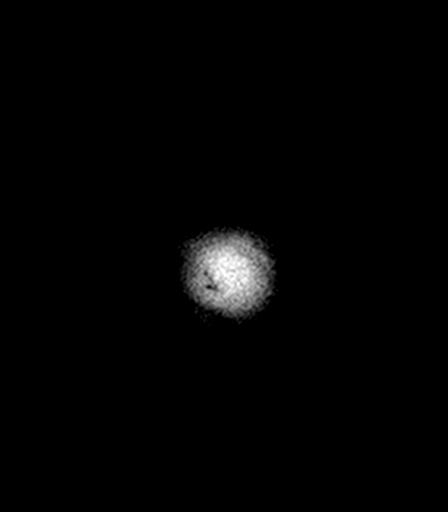

[Series 25: angio_fl3d_cor_pre_ttc=2.0s · coronal · 0.9mm · 0.85mm/px · 4 of 96 slices shown]
[im 1/96]
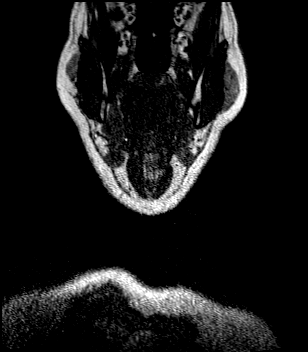
[im 32/96]
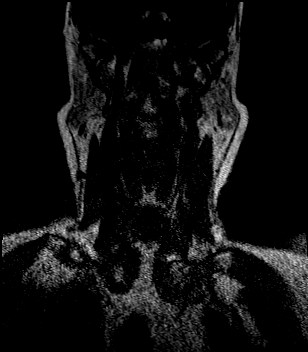
[im 64/96]
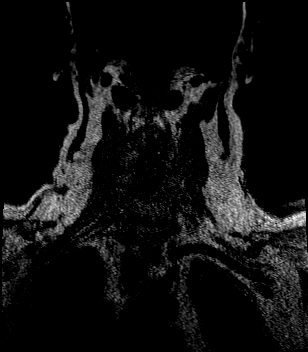
[im 96/96]
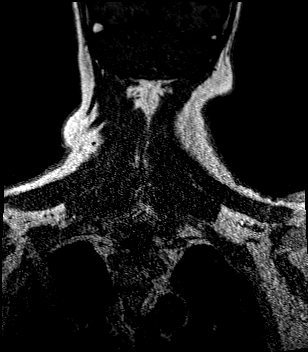

[Series 28: angio_fl3d_cor_post_ttc=2.0s_moco-adv · coronal · 0.9mm · 0.85mm/px · 4 of 95 slices shown]
[im 1/95]
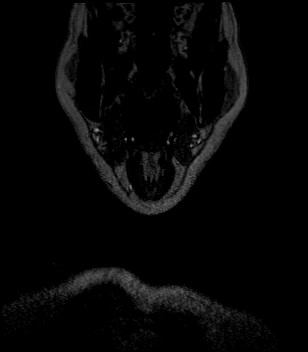
[im 32/95]
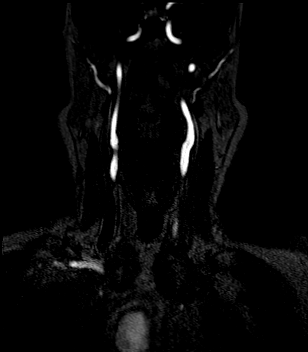
[im 63/95]
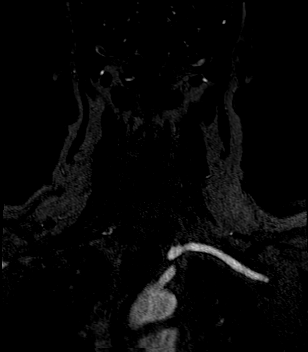
[im 95/95]
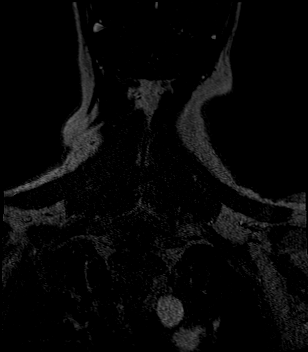

[Series 29: angio_fl3d_cor_post_ttc=2.0s_moco-adv_sub · coronal · 0.9mm · 0.85mm/px · 4 of 89 slices shown]
[im 1/89]
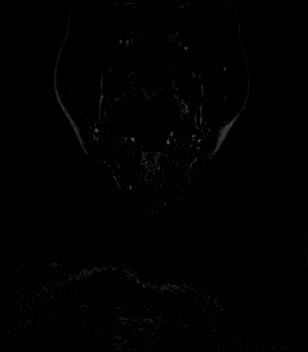
[im 30/89]
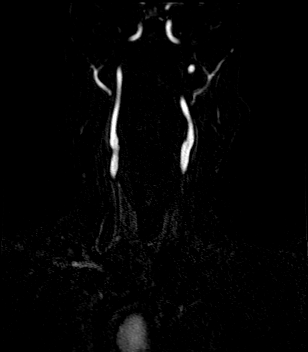
[im 59/89]
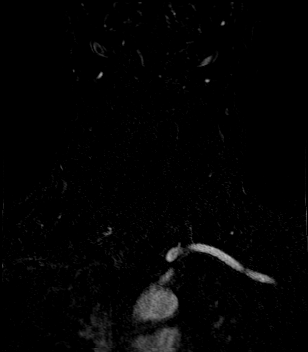
[im 89/89]
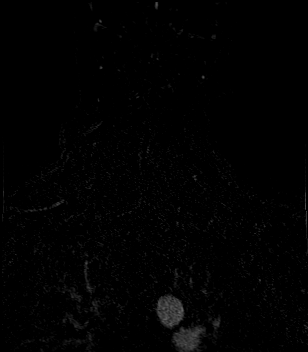

[Series 31: T1 post-contrast · axial · 5.0mm · 0.90mm/px · 1 of 28 slices shown (1 of 2)]
[im 1/28]
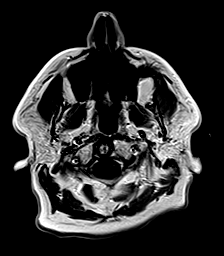

[Series 32: T1 post-contrast · coronal · 5.0mm · 0.90mm/px · 1 of 35 slices shown (2 of 2)]
[im 1/35]
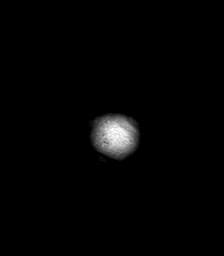

[33 of 48 positions shown; findings below may reference images not displayed]

FINDINGS: MRI HEAD FINDINGS

INTRACRANIAL CONTENTS: No reduced diffusion to suggest acute
ischemia. No susceptibility artifact to suggest hemorrhage. The
ventricles and sulci are normal for patient's age. Patchy
supratentorial white matter FLAIR T2 hyperintensities. Punctate
pontine T2 hyperintensities. No suspicious parenchymal signal,
masses, mass effect. No abnormal intraparenchymal or extra-axial
enhancement. No abnormal extra-axial fluid collections. No
extra-axial masses.

VASCULAR: Normal major intracranial vascular flow voids present at
skull base.

SKULL AND UPPER CERVICAL SPINE: No abnormal sellar expansion. No
suspicious calvarial bone marrow signal. Craniocervical junction
maintained.

SINUSES/ORBITS: Mild paranasal sinus mucosal thickening without
air-fluid levels. Mastoid air cells are well aerated..The included
ocular globes and orbital contents are non-suspicious.

OTHER: None.

MRA HEAD FINDINGS

ANTERIOR CIRCULATION: Normal flow related enhancement of the
included cervical, petrous, cavernous and supraclinoid internal
carotid arteries. RIGHT ophthalmic artery infundibulum. 2 mm
inferiorly directed outpouching RIGHT A1 2 junction. Patent anterior
communicating artery. Patent anterior and middle cerebral arteries.

No large vessel occlusion, flow limiting stenosis, aneurysm.

POSTERIOR CIRCULATION: LEFT vertebral artery is dominant.
Vertebrobasilar arteries are patent, with normal flow related
enhancement of the main branch vessels. RIGHT vertebral artery
terminates in the posterior inferior cerebellar artery. Patent
posterior cerebral arteries. LEFT posterior communicating artery
present.

No large vessel occlusion, flow limiting stenosis,  aneurysm.

ANATOMIC VARIANTS: None.

Source images and MIP images were reviewed.

MRA NECK FINDINGS

ANTERIOR CIRCULATION: The common carotid arteries are widely patent
bilaterally. The carotid bifurcations are patent bilaterally without
hemodynamically significant stenosis by NASCET criteria. No flow
limiting stenosis or luminal irregularity.

POSTERIOR CIRCULATION: Bilateral vertebral arteries are patent to
the vertebrobasilar junction. Moderate stenosis RIGHT vertebral
artery origin. No flow limiting stenosis or luminal irregularity.

Source images and MIP images were reviewed.
IMPRESSION: MRI HEAD:

1. No acute intracranial process.
2. Mild-to-moderate chronic small vessel ischemic changes.

MRA HEAD:

1. No emergent large vessel occlusion or flow-limiting stenosis.
2. 2 mm suspected A-comm aneurysm, this could be confirmed with non
emergent CTA HEAD.

MRA NECK:

1. No hemodynamically significant stenosis ICA.
2. Moderate stenosis RIGHT vertebral artery origin.

## 2020-03-12 ENCOUNTER — Other Ambulatory Visit: Payer: Self-pay | Admitting: Family Medicine

## 2020-03-12 ENCOUNTER — Other Ambulatory Visit: Payer: Self-pay | Admitting: Cardiovascular Disease

## 2020-03-12 DIAGNOSIS — E039 Hypothyroidism, unspecified: Secondary | ICD-10-CM

## 2020-03-12 NOTE — Telephone Encounter (Signed)
Requested Prescriptions  Pending Prescriptions Disp Refills   levothyroxine (SYNTHROID) 25 MCG tablet [Pharmacy Med Name: Levothyroxine Sodium 25 MCG Oral Tablet] 90 tablet 3    Sig: TAKE 1 TABLET BY MOUTH  DAILY     Endocrinology:  Hypothyroid Agents Failed - 03/12/2020  1:01 PM      Failed - TSH needs to be rechecked within 3 months after an abnormal result. Refill until TSH is due.      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

## 2020-05-29 NOTE — Progress Notes (Signed)
Cardiology Office Note  Date:  05/30/2020   ID:  Knowledge Escandon, DOB 1951-01-09, MRN [id number]  PCP:  Olin Hauser, DO   Chief Complaint  Patient presents with  . Other    12 month f/u no complaints today. Meds reviewed verbally with pt.    HPI:  Mr. Foskey is a  70 year old gentleman with a history of  Wegener's, s/p chemo 1991,   Cushing's, on chronic prednisone,  long history of smoking who stopped in 1998,  hyperlipidemia heart murmur (Previous echocardiogram concerning for bicuspid aortic valve),  coronary artery disease, by CT  CT coronary calcium score 05/07/2016, Score of 497 Mild to moderately dilated ascending aorta 4.1 cm in 04/2016 Vertebral artery stenosis on MRI hyperlipidemia.  Who presents for follow-up of his dilated aorta, coronary calcification  LOV 05/2019 Reports he started to develop headaches Seen by rheumatology and neurology,  diagnosed with Right trigeminal neuralgia  history of Granulomatous polyangiitis  MRI brain reviewed Negative trigeminal protocol MRI of the brain, with no structural abnormality to explain patient's symptoms identified. 2. Age-appropriate cerebral atrophy with mild-to-moderate chronic microvascular ischemic disease, grossly similar to previous.  Treated with gabapentin/xanaflex Symptoms have slowly improved  Weight trending upwards over a few years Trend up in his cholesterol, reports compliance with his medications  Continues to work at [employer] walking but limited by hip pain If walks too much, has a cane  No chest pain, SOB on exertion   on Crestor 20 daily with Zetia Previous myalgias on Lipitor  LDL used to be in the mid 60s, now 80   CT coronary calcium score 05/07/2016 Score of 497 calcified aortic valve, Dilated ascending aorta 4.1 cm  EKG personally reviewed by myself on todays visit Shows normal sinus rhythm rate 98 bpm no significant ST or T wave changes  Other past medical history  reviewed Echo 05/2017 Ascending aorta unchanged,  3.9 cm Discussed with him in detail, no significant change compared to CT scan 2018  IN the ER 11/22/2017 Pain, tremors, lost feeling in scalp and feet Flushing, Tachycardia Previous allergy to bactrim  MRA: 2 mm suspected A-comm aneurysm, Moderate stenosis RIGHT vertebral artery origin.  Other past medical history reviewed Stress test 05/14/2016 performed Showing no ischemia normal ejection fraction   Wegener's, long history of chemotherapy, on chronic methotrexate once per week, on chronic prednisone  Reports he's been on cholesterol medication since the 1980s, when it first came out  Prior history and the [employer], Previous issues with gallbladder disease in his 72s  episode of nausea vomiting in November 2016  PMH:   has a past medical history of Arthritis, 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[place]), GERD (gastroesophageal reflux disease), Heart murmur, Hypercholesteremia, Hypothyroidism, Lung disorder, Neuromuscular disorder ([place]), Osteopenia, Osteoporosis, Seasonal allergies, and Wegener's granulomatosis.  PSH:    Past Surgical History:  Procedure Laterality Date  . CHOLECYSTECTOMY     has "metal sutures"   . COLONOSCOPY N/A 10/25/2014   Procedure: COLONOSCOPY;  Surgeon: Lucilla Lame, MD;  Location: [facility];  Service: Gastroenterology;  Laterality: N/A;  . TREATMENT FISTULA ANAL     closure with rectal advancement flap    Current Outpatient Medications  Medication Sig Dispense Refill  . acetaminophen (TYLENOL) 650 MG CR tablet Take 350 mg by mouth every 8 (eight) hours as needed for pain or fever.     Marland Kitchen aspirin 81 MG tablet Take 81 mg by mouth daily. AM    . B Complex-C-Folic Acid (B-COMPLEX BALANCED PO)  Take 1 tablet by mouth daily.    . calcium carbonate (OS-CAL) 600 MG TABS tablet Take 600 mg by mouth daily. AM    . ezetimibe (ZETIA) 10 MG tablet Take 1 tablet (10 mg total) by mouth daily. Please call office to schedule  follow up appointment. 90 tablet 0  . famotidine (PEPCID) 40 MG tablet TAKE 1 TABLET BY MOUTH  DAILY 90 tablet 2  . fluticasone (FLONASE) 50 MCG/ACT nasal spray Place 2 sprays into both nostrils daily. Use for 4-6 weeks then stop and use seasonally or as needed. 16 g 3  . folic acid (FOLVITE) 212 MCG tablet Take 400 mcg by mouth daily. AM    . gabapentin (NEURONTIN) 100 MG capsule Take 200 mg by mouth 3 (three) times daily.    Marland Kitchen ibandronate (BONIVA) 150 MG tablet Take 150 mg by mouth every 30 (thirty) days.    Marland Kitchen levothyroxine (SYNTHROID) 25 MCG tablet TAKE 1 TABLET BY MOUTH  DAILY 90 tablet 3  . loratadine (CLARITIN) 10 MG tablet Take 10 mg by mouth daily as needed for allergies.     . magnesium oxide (MAG-OX) 400 MG tablet Take 400 mg by mouth daily.    . niacin (NIASPAN) 500 MG CR tablet Take 750 mg by mouth at bedtime.    . predniSONE (DELTASONE) 5 MG tablet Take 5 mg by mouth daily.    . rosuvastatin (CRESTOR) 20 MG tablet Take 1 tablet (20 mg total) by mouth daily. Please call office to schedule follow up appointment. 90 tablet 0  . tiZANidine (ZANAFLEX) 4 MG tablet Take 2 mg by mouth 3 (three) times daily as needed.    . traMADol (ULTRAM) 50 MG tablet Take 1 tablet (50 mg total) by mouth every 6 (six) hours as needed for moderate pain or severe pain. 120 tablet 2  . triamcinolone ointment (KENALOG) 0.5 % Apply 1 application topically 2 (two) times daily as needed. For up to 2 weeks at a time, then may repeat 30 g 2  . Vitamin D, Cholecalciferol, 400 UNITS CAPS Take 400 Units by mouth daily.     No current facility-administered medications for this visit.     Allergies:   Sulfa antibiotics, Sulfamethoxazole-trimethoprim, Penicillins, Codeine, Prilosec [omeprazole], Tetracycline, and Tetracyclines & related   Social History:  The patient  reports that he quit smoking about 23 years ago. He has quit using smokeless tobacco. He reports current alcohol use of about 3.0 standard drinks of  alcohol per week. He reports that he does not use drugs.   Family History:   Family history is unknown by patient.    Review of Systems: Review of Systems  Constitutional: Negative.   Respiratory: Negative.   Cardiovascular: Negative.   Gastrointestinal: Negative.   Musculoskeletal: Positive for joint pain.  Neurological: Negative.   Psychiatric/Behavioral: Negative.   All other systems reviewed and are negative.    PHYSICAL EXAM: VS:  BP 138/70 (BP Location: Left Arm, Patient Position: Sitting, Cuff Size: Normal)   Pulse 98   Ht 5' 6"  (1.676 m)   Wt 182 lb 6 oz (82.7 kg)   SpO2 98%   BMI 29.44 kg/m  , BMI Body mass index is 29.44 kg/m. Constitutional:  oriented to person, place, and time. No distress.  HENT:  Head: Grossly normal Eyes:  no discharge. No scleral icterus.  Neck: No JVD, no carotid bruits  Cardiovascular: Regular rate and rhythm, 2/6 systolic ejection murmur appreciated right sternal border Pulmonary/Chest: Clear to  auscultation bilaterally, no wheezes or rails Abdominal: Soft.  no distension.  no tenderness.  Musculoskeletal: Normal range of motion Neurological:  normal muscle tone. Coordination normal. No atrophy Skin: Skin warm and dry Psychiatric: normal affect, pleasant   Recent Labs: 12/09/2019: ALT 21; BUN 28; Creatinine, Ser 1.16; Hemoglobin 15.5; Platelets 215; Potassium 4.9; Sodium 141; TSH 1.090    Lipid Panel Lab Results  Component Value Date   CHOL 174 10/28/2019   HDL 54 10/28/2019   LDLCALC 81 10/28/2019   TRIG 237 (A) 10/28/2019      Wt Readings from Last 3 Encounters:  05/30/20 182 lb 6 oz (82.7 kg)  12/09/19 179 lb (81.2 kg)  06/09/19 183 lb 2 oz (83.1 kg)     ASSESSMENT AND PLAN:  Mixed hyperlipidemia Cholesterol is at goal on the current lipid regimen. No changes to the medications were made.Stable  Essential hypertension Blood pressure is well controlled on today's visit. No changes made to the  medications.  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    Signed, Esmond Plants, M.D., Ph.D. 05/30/2020  [place], [place]

## 2020-05-30 ENCOUNTER — Encounter: Payer: Self-pay | Admitting: Cardiovascular Disease

## 2020-05-30 ENCOUNTER — Other Ambulatory Visit: Payer: Self-pay

## 2020-05-30 ENCOUNTER — Ambulatory Visit: Payer: Managed Care, Other (non HMO) | Admitting: Cardiovascular Disease

## 2020-05-30 VITALS — BP 138/70 | HR 98 | Ht 66.0 in | Wt 182.4 lb

## 2020-05-30 DIAGNOSIS — I25118 Atherosclerotic heart disease of native coronary artery with other forms of angina pectoris: Secondary | ICD-10-CM

## 2020-05-30 DIAGNOSIS — E782 Mixed hyperlipidemia: Secondary | ICD-10-CM

## 2020-05-30 DIAGNOSIS — Q231 Congenital insufficiency of aortic valve: Secondary | ICD-10-CM | POA: Diagnosis not present

## 2020-05-30 DIAGNOSIS — I10 Essential (primary) hypertension: Secondary | ICD-10-CM | POA: Diagnosis not present

## 2020-05-30 DIAGNOSIS — I6509 Occlusion and stenosis of unspecified vertebral artery: Secondary | ICD-10-CM

## 2020-05-30 DIAGNOSIS — I712 Thoracic aortic aneurysm, without rupture: Secondary | ICD-10-CM | POA: Diagnosis not present

## 2020-05-30 DIAGNOSIS — I7121 Aneurysm of the ascending aorta, without rupture: Secondary | ICD-10-CM

## 2020-05-30 NOTE — Patient Instructions (Signed)
Medication Instructions:  No changes  If you need a refill on your cardiac medications before your next appointment, please call your pharmacy.    Lab work: No new labs needed   If you have labs (blood work) drawn today and your tests are completely normal, you will receive your results only by: . MyChart Message (if you have MyChart) OR . A paper copy in the mail If you have any lab test that is abnormal or we need to change your treatment, we will call you to review the results.   Testing/Procedures: No new testing needed   Follow-Up: At [facility], you and your health needs are our priority.  As part of our continuing mission to provide you with exceptional heart care, we have created designated Provider Care Teams.  These Care Teams include your primary Cardiologist (physician) and Advanced Practice Providers (APPs -  Physician Assistants and Nurse Practitioners) who all work together to provide you with the care you need, when you need it.  . You will need a follow up appointment in 12 months  . Providers on your designated Care Team:   . Christopher Berge, NP . Ryan Dunn, PA-C . Jacquelyn Visser, PA-C  Any Other Special Instructions Will Be Listed Below (If Applicable).  COVID-19 Vaccine Information can be found at: [email or web address] For questions related to vaccine distribution or appointments, please email [email or web address] or call [phone].     

## 2020-06-07 ENCOUNTER — Other Ambulatory Visit: Payer: Self-pay

## 2020-06-07 ENCOUNTER — Ambulatory Visit: Payer: Managed Care, Other (non HMO) | Admitting: Family Medicine

## 2020-06-07 ENCOUNTER — Encounter: Payer: Self-pay | Admitting: Family Medicine

## 2020-06-07 VITALS — BP 125/70 | HR 84 | Ht 66.0 in | Wt 186.6 lb

## 2020-06-07 DIAGNOSIS — I10 Essential (primary) hypertension: Secondary | ICD-10-CM

## 2020-06-07 DIAGNOSIS — E039 Hypothyroidism, unspecified: Secondary | ICD-10-CM

## 2020-06-07 DIAGNOSIS — M8949 Other hypertrophic osteoarthropathy, multiple sites: Secondary | ICD-10-CM

## 2020-06-07 DIAGNOSIS — E782 Mixed hyperlipidemia: Secondary | ICD-10-CM | POA: Diagnosis not present

## 2020-06-07 DIAGNOSIS — M159 Polyosteoarthritis, unspecified: Secondary | ICD-10-CM

## 2020-06-07 DIAGNOSIS — R7309 Other abnormal glucose: Secondary | ICD-10-CM

## 2020-06-07 MED ORDER — TRAMADOL HCL 50 MG PO TABS: 50.0000 mg | ORAL_TABLET | Freq: Four times a day (QID) | ORAL | 2 refills | Status: DC | PRN

## 2020-06-07 NOTE — Assessment & Plan Note (Addendum)
Controlled hypothyroidism Continue current Levothyroxine 73mg daily No repeat check today will get it in 6 months [facility] orders

## 2020-06-07 NOTE — Assessment & Plan Note (Signed)
Previously controlled cholesterol on statin and lifestyle except high TG  Plan: 1. Continue current meds - Rosuvastatin 1m daily, Zetia 131mdaily 2. Continue ASA 8139mor primary ASCVD risk reduction 3. Encourage improved lifestyle - low carb/cholesterol, reduce portion size, continue improving regular exercise as tolerated

## 2020-06-07 NOTE — Progress Notes (Signed)
Subjective:    Patient ID: Adrian Zavala, male    DOB: 1950-11-09, 70 y.o.   MRN: [id number]  Adrian Zavala is a 70 y.o. male presenting on 06/07/2020 for Arthritis   HPI   Hyperlipidemia - Followed by Cardiology - He is on Zetia and Crestor  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, hands, hips) Dr Candelaria Stagers [facility] / [facility] Rheumatology He had x-rays C/T spine, showed OA/DJD Since last visit we increased Tramadol from twice a day up to 3 times a day with good results. Needs refill soon, has 0 refills currently. But doing very well on med with good function improvement and pain control. - He has chronic back pain with DJD bone spurs and has curvature of spine, worse with prolonged sitting at work, causing worse back pain, followed by Rheumatology - Taking Tylenol Arthritis 6108m x1per dose in afternoon and evening with some relief - On Tizanidine half of 437mTID PRN - Worse if turn neck to the R has grinding in neck - In past he had some issues with excessive walking limiting him and pain - Reports chronic problem with chronic joint pain and stiffness from arthritis. 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  Hypothyroidism Last lab 11/2019, stable TSH labs. Continues on Levothyroxine 2527mdaily. No change in symptoms.  Upcoming Cataract surgery 06/24/20 L eye, and then 2 weeks later R eye.  Health Maintenance: [facility]  Colon CA Screening: Last Colonoscopy 10/25/14 (done by [facility] - Dr WohRob Buntingresults with polyp found to be hyperplastic, good for 10 years. Currently asymptomatic. No known family history of colon CA.   [facility] 06/08/2019 12/08/2018 10/27/2018  Decreased Interest 0 0 0  Down, Depressed, Hopeless 0 0 0  PHQ - 2 Score 0 0 0     Social History   Tobacco Use  . Smoking status: Former Smoker    Quit date: 08/24/1996    Years since quitting: 23.8  . Smokeless tobacco: Former UseNetwork engineere Topics  . Alcohol use: Yes    Alcohol/week: 3.0 standard drinks    Types: 3 Cans of beer per week  . Drug use: No    Review of Systems  Constitutional: Negative for activity change, appetite change, chills, diaphoresis, fatigue and fever.  HENT: Negative for congestion and hearing loss.   Eyes: Negative for visual disturbance.  Respiratory: Negative for apnea, cough, chest tightness, shortness of breath and wheezing.   Cardiovascular: Negative for chest pain, palpitations and leg swelling.  Gastrointestinal: Negative for abdominal pain, anal bleeding, blood in stool, constipation, diarrhea, nausea and vomiting.  Endocrine: Negative for cold intolerance.  Genitourinary: Negative for difficulty urinating, dysuria, frequency and hematuria.  Musculoskeletal: Positive for arthralgias. Negative for back pain and neck pain.  Skin: Negative for rash.  Allergic/Immunologic: Negative for environmental allergies.  Neurological: Negative for dizziness, weakness, light-headedness, numbness and headaches.  Hematological: Negative for adenopathy.  Psychiatric/Behavioral: Negative for behavioral problems, dysphoric mood and sleep disturbance. The patient is not nervous/anxious.    Per HPI unless specifically indicated above     Objective:    BP 125/70   Pulse 84   Ht 5' 6"  (1.676 m)   Wt 186 lb 9.6 oz (84.6 kg)   SpO2 99%   BMI 30.12 kg/m  Wt Readings from Last 3 Encounters:  06/07/20 186 lb 9.6 oz (84.6 kg)  05/30/20 182 lb 6 oz (82.7 kg)  12/09/19 179 lb (81.2 kg)    Physical Exam Vitals and nursing note reviewed.  Constitutional:      General: He is not in acute distress.    Appearance: He is well-developed. He is not diaphoretic.     Comments: Well-appearing, comfortable, cooperative  HENT:     Head:  Normocephalic and atraumatic.  Eyes:     General:        Right eye: No discharge.        Left eye: No discharge.     Conjunctiva/sclera: Conjunctivae normal.  Cardiovascular:     Rate and Rhythm: Normal rate.  Pulmonary:     Effort: Pulmonary effort is normal.  Skin:    General: Skin is warm and dry.     Findings: No erythema or rash.  Neurological:     Mental Status: He is alert and oriented to person, place, and time.  Psychiatric:        Behavior: Behavior normal.     Comments: Well groomed, good eye contact, normal speech and thoughts       Results for orders placed or performed in visit on 12/09/19  Novel Coronavirus, NAA ([facility])   Specimen: Nasopharyngeal(NP) swabs in vial transport medium  Result Value Ref Range   SARS-CoV-2, NAA semi-quant total ab spike 423.9   Comprehensive metabolic panel  Result Value Ref Range   Glucose 87 65 - 99 mg/dL   BUN 28 (H) 8 - 27 mg/dL   Creatinine, Ser 1.16 0.76 - 1.27 mg/dL   GFR calc non Af Amer 64 >59 mL/min/1.73   GFR calc Af Amer 74 >59 mL/min/1.73   BUN/Creatinine Ratio 24 10 - 24   Sodium 141 134 - 144 mmol/L   Potassium 4.9 3.5 - 5.2 mmol/L   Chloride 104 96 - 106 mmol/L   CO2 23 20 - 29 mmol/L   Calcium 10.5 (H) 8.6 - 10.2 mg/dL   Total Protein 7.0 6.0 - 8.5 g/dL   Albumin 4.9 (H) 3.8 - 4.8 g/dL   Globulin, Total 2.1 1.5 - 4.5 g/dL   Albumin/Globulin Ratio 2.3 (H) 1.2 - 2.2   Bilirubin Total 0.6 0.0 - 1.2 mg/dL   Alkaline Phosphatase 66 44 - 121 IU/L   AST 18 0 - 40 IU/L   ALT 21 0 - 44 IU/L  T4, free  Result Value Ref Range   Free T4 1.21 0.82 - 1.77 ng/dL  TSH  Result Value Ref Range   TSH 1.090 0.450 - 4.500 uIU/mL  PSA  Result Value Ref Range   Prostate Specific Ag, Serum 0.5 0.0 - 4.0 ng/mL  CBC with Differential/Platelet  Result Value Ref Range   WBC 10.9 (H) 3.4 - 10.8 x10E3/uL   RBC 4.74 4.14 - 5.80 x10E6/uL   Hemoglobin 15.5 13.0 - 17.7 g/dL   Hematocrit 45.8 37.5 - 51.0 %   MCV 97 79 - 97 fL    MCH 32.7 26.6 - 33.0 pg   MCHC 33.8 31.5 - 35.7 g/dL   RDW 12.3 11.6 - 15.4 %   Platelets 215 150 - 450 x10E3/uL   Neutrophils 74 Not Estab. %   Lymphs 14 Not Estab. %   Monocytes 8 Not Estab. %   Eos 2 Not Estab. %   Basos 1 Not Estab. %   Neutrophils Absolute 8.1 (H) 1.4 - 7.0 x10E3/uL  Lymphocytes Absolute 1.5 0.7 - 3.1 x10E3/uL   Monocytes Absolute 0.9 0.1 - 0.9 x10E3/uL   EOS (ABSOLUTE) 0.2 0.0 - 0.4 x10E3/uL   Basophils Absolute 0.1 0.0 - 0.2 x10E3/uL   Immature Granulocytes 1 Not Estab. %   Immature Grans (Abs) 0.1 0.0 - 0.1 V49S4/HQ  Basic metabolic panel  Result Value Ref Range   Glucose 83    Creatinine 1.1 0.6 - 1.3  Lipid panel  Result Value Ref Range   Triglycerides 237 (A) 40 - 160   Cholesterol 174 0 - 200   HDL 54 35 - 70   LDL Cholesterol 81   Hemoglobin A1c  Result Value Ref Range   Hemoglobin A1C 5.7       Assessment & Plan:   Problem List Items Addressed This Visit    Osteoarthritis of multiple joints - Primary    Chronic OA/DJD multiple joints Underlying rheumatologic complication Controlled on Tramadol and muscle relaxant. Refill Tramadol, check PDMP      Relevant Medications   traMADol (ULTRAM) 50 MG tablet   Hypothyroidism    Controlled hypothyroidism Continue current Levothyroxine 54mg daily No repeat check today will get it in 6 months [facility] orders      HLD (hyperlipidemia)    Previously controlled cholesterol on statin and lifestyle except high TG  Plan: 1. Continue current meds - Rosuvastatin 230mdaily, Zetia 1047maily 2. Continue ASA 60m46mr primary ASCVD risk reduction 3. Encourage improved lifestyle - low carb/cholesterol, reduce portion size, continue improving regular exercise as tolerated      Essential hypertension    Well-controlled HTN No known complications    Plan:  1. Remain off anti HTN therapy 2. Encourage improved lifestyle - low sodium diet, regular exercise 3. Continue monitor BP outside office,  bring readings to next visit, if persistently >140/90 or new symptoms notify office sooner      Elevated hemoglobin A1c    Last checked 5.7 in 10/2019 He is doing well, managing diet no new concerns Will defer to next apt         Meds ordered this encounter  Medications  . traMADol (ULTRAM) 50 MG tablet    Sig: Take 1 tablet (50 mg total) by mouth every 6 (six) hours as needed for moderate pain or severe pain.    Dispense:  120 tablet    Refill:  2    Keep refills on file for when ready to fill.     Follow up plan: Return in about 6 months (around 12/08/2020) for 6 month follow-up for Annual Physical ([facility] results).  Future can place orders for [facility] in advance, and print orders for patient, otherwise can print after physical.  AlexNobie Putnam [place] 06/07/2020, 8:12 AM

## 2020-06-07 NOTE — Assessment & Plan Note (Signed)
Chronic OA/DJD multiple joints Underlying rheumatologic complication Controlled on Tramadol and muscle relaxant. Refill Tramadol, check PDMP

## 2020-06-07 NOTE — Assessment & Plan Note (Signed)
Well-controlled HTN No known complications    Plan:  1. Remain off anti HTN therapy 2. Encourage improved lifestyle - low sodium diet, regular exercise 3. Continue monitor BP outside office, bring readings to next visit, if persistently >140/90 or new symptoms notify office sooner

## 2020-06-07 NOTE — Assessment & Plan Note (Signed)
Last checked 5.7 in 10/2019 He is doing well, managing diet no new concerns Will defer to next apt

## 2020-06-07 NOTE — Patient Instructions (Addendum)
Thank you for coming to the office today.  Refilled Tramadol today to [facility].  Let me know if any other medicines need refill  DUE for FASTING BLOOD WORK (no food or drink after midnight before the lab appointment, only water or coffee without cream/sugar on the morning of)  SCHEDULE "Lab Only" visit in the morning at the clinic for lab draw in 6 MONTHS   - Make sure Lab Only appointment is at about 1 week before your next appointment, so that results will be available  For Lab Results, once available within 2-3 days of blood draw, you can can log in to MyChart online to view your results and a brief explanation. Also, we can discuss results at next follow-up visit.    Please schedule a Follow-up Appointment to: Return in about 6 months (around 12/08/2020) for 6 month follow-up for Annual Physical ([facility] results).  If you have any other questions or concerns, please feel free to call the office or send a message through [place]. You may also schedule an earlier appointment if necessary.  Additionally, you may be receiving a survey about your experience at our office within a few days to 1 week by e-mail or mail. We value your feedback.  [name], DO [place]

## 2020-06-10 ENCOUNTER — Emergency Department: Payer: Managed Care, Other (non HMO)

## 2020-06-10 ENCOUNTER — Emergency Department
Admission: EM | Admit: 2020-06-10 | Discharge: 2020-06-10 | Disposition: A | Discharge status: Home / Self Care | Payer: Managed Care, Other (non HMO) | Attending: Emergency Medicine | Admitting: Emergency Medicine

## 2020-06-10 ENCOUNTER — Encounter: Payer: Self-pay | Admitting: Emergency Medicine

## 2020-06-10 ENCOUNTER — Other Ambulatory Visit: Payer: Self-pay

## 2020-06-10 DIAGNOSIS — Z7982 Long term (current) use of aspirin: Secondary | ICD-10-CM | POA: Diagnosis not present

## 2020-06-10 DIAGNOSIS — I10 Essential (primary) hypertension: Secondary | ICD-10-CM | POA: Diagnosis not present

## 2020-06-10 DIAGNOSIS — Z79899 Other long term (current) drug therapy: Secondary | ICD-10-CM | POA: Insufficient documentation

## 2020-06-10 DIAGNOSIS — E039 Hypothyroidism, unspecified: Secondary | ICD-10-CM | POA: Diagnosis not present

## 2020-06-10 DIAGNOSIS — Z87891 Personal history of nicotine dependence: Secondary | ICD-10-CM | POA: Diagnosis not present

## 2020-06-10 DIAGNOSIS — R079 Chest pain, unspecified: Secondary | ICD-10-CM | POA: Diagnosis not present

## 2020-06-10 LAB — BASIC METABOLIC PANEL
Anion gap: 6 (ref 5–15)
BUN: 32 mg/dL — ABNORMAL HIGH (ref 8–23)
CO2: 24 mmol/L (ref 22–32)
Calcium: 10.7 mg/dL — ABNORMAL HIGH (ref 8.9–10.3)
Chloride: 107 mmol/L (ref 98–111)
Creatinine, Ser: 0.99 mg/dL (ref 0.61–1.24)
GFR, Estimated: >60 mL/min (ref >60)
Glucose, Bld: 101 mg/dL — ABNORMAL HIGH (ref 70–99)
Potassium: 4.4 mmol/L (ref 3.5–5.1)
Sodium: 137 mmol/L (ref 135–145)

## 2020-06-10 LAB — CBC
HCT: 42.9 % (ref 39.0–52.0)
Hemoglobin: 14.1 g/dL (ref 13.0–17.0)
MCH: 31.5 pg (ref 26.0–34.0)
MCHC: 32.9 g/dL (ref 30.0–36.0)
MCV: 96 fL (ref 80.0–100.0)
Platelets: 198 10*3/uL (ref 150–400)
RBC: 4.47 MIL/uL (ref 4.22–5.81)
RDW: 12.7 % (ref 11.5–15.5)
WBC: 11.2 10*3/uL — ABNORMAL HIGH (ref 4.0–10.5)
nRBC: 0 % (ref 0.0–0.2)

## 2020-06-10 LAB — TROPONIN I (HIGH SENSITIVITY)
Troponin I (High Sensitivity): 4 ng/L (ref <18)
Troponin I (High Sensitivity): 3 ng/L (ref <18)

## 2020-06-10 NOTE — ED Provider Notes (Signed)
[facility] Emergency Department Provider Note   ____________________________________________   Event Date/Time   First MD Initiated Contact with Patient 06/10/20 1451     (approximate)  I have reviewed the triage vital signs and the nursing notes.   HISTORY  Chief Complaint Chest Pain    HPI Adrian Zavala is a 70 y.o. male with past medical history of hypertension, hyperlipidemia, Wegener's granulomatosis, and bicuspid aortic valve who presents to the ED complaining of chest pain.  Patient states that he was at his desk earlier this afternoon when he had sudden onset of pain over his left chest.  He describes it as a sharp stabbing pain that moved up into his left arm and neck.  Pain persisted for about 1 hour before resolving on its own.  He denies any associated fevers, cough, shortness of breath, nausea, or vomiting.  He denies any history of similar symptoms in the past, does not have any history of MI but follows with cardiology for ascending aortic aneurysm as well as bicuspid aortic valve.        Past Medical History:  Diagnosis Date  . Arthritis    Spine, Left Hip, Hands  . 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[place])   . GERD (gastroesophageal reflux disease)   . Heart murmur   . Hypercholesteremia   . Hypothyroidism    graves  . Lung disorder    S/P chemo for Wegener's  . Neuromuscular disorder ([place])    some leg weakness S/P Chemo for Wegener's  . Osteopenia   . Osteoporosis    Femur  . Seasonal allergies   . Wegener's granulomatosis     Patient Active Problem List   Diagnosis Date Noted  . Bursitis of left hip 04/28/2018  . Elevated hemoglobin A1c 10/24/2017  . Osteoporosis 10/01/2016  . Irritable bowel syndrome (IBS) 10/01/2016  . Ascending aortic aneurysm ([place]) 10/01/2016  . Osteoarthritis of multiple joints 04/16/2016  . BPH (benign prostatic hyperplasia) 06/06/2015  . Muscle pain 06/06/2015  . Fatty liver disease, nonalcoholic 31/49/7026  .  Wegener's granulomatosis 05/09/2015  . Colitis 01/03/2015  . HLD (hyperlipidemia) 01/03/2015  . Essential hypertension 01/03/2015  . Eye inflamed 01/03/2015  . Hypothyroidism 01/03/2015  . Bicuspid aortic valve 01/03/2015  . Special screening for malignant neoplasms, colon   . Benign neoplasm of sigmoid colon   . First degree hemorrhoids   . Arthritis, degenerative 06/03/2013    Past Surgical History:  Procedure Laterality Date  . CHOLECYSTECTOMY     has "metal sutures"   . COLONOSCOPY N/A 10/25/2014   Procedure: COLONOSCOPY;  Surgeon: Lucilla Lame, MD;  Location: [facility];  Service: Gastroenterology;  Laterality: N/A;  . TREATMENT FISTULA ANAL     closure with rectal advancement flap    Prior to Admission medications   Medication Sig Start Date End Date Taking? Authorizing Provider  acetaminophen (TYLENOL) 650 MG CR tablet Take 350 mg by mouth every 8 (eight) hours as needed for pain or fever.     [provider]  aspirin 81 MG tablet Take 81 mg by mouth daily. AM    [provider]  B Complex-C-Folic Acid (B-COMPLEX BALANCED PO) Take 1 tablet by mouth daily.    [provider]  calcium carbonate (OS-CAL) 600 MG TABS tablet Take 600 mg by mouth daily. AM    [provider]  ezetimibe (ZETIA) 10 MG tablet Take 1 tablet (10 mg total) by mouth daily. Please call office to schedule follow up  appointment. 03/14/20   Minna Merritts, MD  famotidine (PEPCID) 40 MG tablet TAKE 1 TABLET BY MOUTH  DAILY 10/31/19   Parks Ranger, Devonne Doughty, DO  fluticasone (FLONASE) 50 MCG/ACT nasal spray Place 2 sprays into both nostrils daily. Use for 4-6 weeks then stop and use seasonally or as needed. 10/27/18   Karamalegos, Devonne Doughty, DO  folic acid (FOLVITE) 681 MCG tablet Take 400 mcg by mouth daily. AM    [provider]  gabapentin (NEURONTIN) 100 MG capsule Take 200 mg by mouth 3 (three) times daily. 08/30/19   [provider]  ibandronate  (BONIVA) 150 MG tablet Take 150 mg by mouth every 30 (thirty) days. 01/26/19   [provider]  levothyroxine (SYNTHROID) 25 MCG tablet TAKE 1 TABLET BY MOUTH  DAILY 03/12/20   Parks Ranger, Devonne Doughty, DO  loratadine (CLARITIN) 10 MG tablet Take 10 mg by mouth daily as needed for allergies.     [provider]  magnesium oxide (MAG-OX) 400 MG tablet Take 400 mg by mouth daily. 07/27/15   [provider]  niacin (NIASPAN) 500 MG CR tablet Take 750 mg by mouth at bedtime.    [provider]  predniSONE (DELTASONE) 5 MG tablet Take 5 mg by mouth daily.    [provider]  rosuvastatin (CRESTOR) 20 MG tablet Take 1 tablet (20 mg total) by mouth daily. Please call office to schedule follow up appointment. 03/14/20   Minna Merritts, MD  tiZANidine (ZANAFLEX) 4 MG tablet Take 2 mg by mouth 3 (three) times daily as needed.    [provider]  traMADol (ULTRAM) 50 MG tablet Take 1 tablet (50 mg total) by mouth every 6 (six) hours as needed for moderate pain or severe pain. 06/07/20   Karamalegos, Devonne Doughty, DO  triamcinolone ointment (KENALOG) 0.5 % Apply 1 application topically 2 (two) times daily as needed. For up to 2 weeks at a time, then may repeat 06/08/19   Olin Hauser, DO  Vitamin D, Cholecalciferol, 400 UNITS CAPS Take 400 Units by mouth daily.    [provider]    Allergies Sulfa antibiotics, Sulfamethoxazole-trimethoprim, Penicillins, Codeine, Prilosec [omeprazole], Tetracycline, and Tetracyclines & related  Family History  Family history unknown: Yes    Social History Social History   Tobacco Use  . Smoking status: Former Smoker    Quit date: 08/24/1996    Years since quitting: 23.8  . Smokeless tobacco: Former Network engineer Use Topics  . Alcohol use: Yes    Alcohol/week: 3.0 standard drinks    Types: 3 Cans of beer per week  . Drug use: No    Review of Systems  Constitutional: No fever/chills Eyes:  No visual changes. ENT: No sore throat. Cardiovascular: Positive for chest pain. Respiratory: Denies shortness of breath. Gastrointestinal: No abdominal pain.  No nausea, no vomiting.  No diarrhea.  No constipation. Genitourinary: Negative for dysuria. Musculoskeletal: Negative for back pain. Skin: Negative for rash. Neurological: Negative for headaches, focal weakness or numbness.  ____________________________________________   PHYSICAL EXAM:  VITAL SIGNS: ED Triage Vitals  Enc Vitals Group     BP 06/10/20 1233 135/83     Pulse Rate 06/10/20 1233 74     Resp 06/10/20 1233 16     Temp 06/10/20 1233 98 F (36.7 C)     Temp Source 06/10/20 1233 Oral     SpO2 06/10/20 1233 96 %     Weight 06/10/20 1243 177 lb (80.3 kg)  Height 06/10/20 1243 5' 6"  (1.676 m)     Head Circumference --      Peak Flow --      Pain Score 06/10/20 1243 1     Pain Loc --      Pain Edu? --      Excl. in [place]? --     Constitutional: Alert and oriented. Eyes: Conjunctivae are normal. Head: Atraumatic. Nose: No congestion/rhinnorhea. Mouth/Throat: Mucous membranes are moist. Neck: Normal ROM Cardiovascular: Normal rate, regular rhythm. Grossly normal heart sounds.  2+ radial pulses bilaterally. Respiratory: Normal respiratory effort.  No retractions. Lungs CTAB.  No chest wall tenderness to palpation. Gastrointestinal: Soft and nontender. No distention. Genitourinary: deferred Musculoskeletal: No lower extremity tenderness nor edema. Neurologic:  Normal speech and language. No gross focal neurologic deficits are appreciated. Skin:  Skin is warm, dry and intact. No rash noted. Psychiatric: Mood and affect are normal. Speech and behavior are normal.  ____________________________________________   LABS (all labs ordered are listed, but only abnormal results are displayed)  Labs Reviewed  BASIC METABOLIC PANEL - Abnormal; Notable for the following components:      Result Value   Glucose, Bld  101 (*)    BUN 32 (*)    Calcium 10.7 (*)    All other components within normal limits  CBC - Abnormal; Notable for the following components:   WBC 11.2 (*)    All other components within normal limits  TROPONIN I (HIGH SENSITIVITY)  TROPONIN I (HIGH SENSITIVITY)   ____________________________________________  EKG  ED ECG REPORT I, Blake Divine, the attending physician, personally viewed and interpreted this ECG.   Date: 06/10/2020  EKG Time: 12:38  Rate: 71  Rhythm: normal sinus rhythm  Axis: Normal  Intervals:Incomplete RBBB  ST&T Change: None   PROCEDURES  Procedure(s) performed (including Critical Care):  Procedures   ____________________________________________   INITIAL IMPRESSION / ASSESSMENT AND PLAN / ED COURSE       70 year old male with no significant past medical history of hypertension, hyperlipidemia, Wegener's granulomatosis, bicuspid aortic valve, and a sending aortic aneurysm who presents to the ED complaining of chest pain that has since resolved.  EKG shows no evidence of arrhythmia or ischemia and initial troponin is negative.  Patient's pain has not resolved and therefore I have a low suspicion for PE, dissection, or other issue related to his aortic aneurysm.  Symptoms sound atypical for ACS and we will check repeat troponin, but if this is negative he would be appropriate for outpatient cardiology follow-up.  Chest x-ray reviewed by me and shows no infiltrate, edema, or effusion.  Repeat troponin is negative and patient remains chest pain free. He is appropriate for discharge home with PCP or Cardiology follow-up, was counseled to return to the ED for any new or worsening symptoms.      ____________________________________________   FINAL CLINICAL IMPRESSION(S) / ED DIAGNOSES  Final diagnoses:  Nonspecific chest pain     ED Discharge Orders    None       Note:  This document was prepared using Dragon voice recognition software  and may include unintentional dictation errors.   Blake Divine, MD 06/10/20 [phone]

## 2020-06-10 NOTE — ED Triage Notes (Signed)
First RN note: pt to ED via [facility] from [employer] with c/o CP to the L side that radiates to L arm that he describes as pressure. Per EMS pain started at approx 1115 started at 8/10 then decreased to 4/10 prior to EMS arrival. Per EMS after 324 ASA and 2 SL nitro sprays pain down to 1/10.   Per EMS pt with hx of AAA (4.43m is being medically managed) and "leaky valve". Per EMS unremarkable EKG, 20g to R AC.

## 2020-06-10 NOTE — ED Triage Notes (Signed)
Pt here via EMS from his work at [employer] with c/o pain directly under his left breast, radiating to L arm and left temple. Pt is calm, no distress noted, was given 328m ASA and 2SL sprays of nitro with some relief. Hx of AAA being medically treated at this time.

## 2020-06-30 ENCOUNTER — Ambulatory Visit: Payer: Managed Care, Other (non HMO) | Admitting: Physician Assistant

## 2020-07-01 ENCOUNTER — Other Ambulatory Visit: Payer: Self-pay

## 2020-07-01 ENCOUNTER — Ambulatory Visit: Payer: Managed Care, Other (non HMO) | Admitting: Physician Assistant

## 2020-07-01 ENCOUNTER — Encounter: Payer: Self-pay | Admitting: Physician Assistant

## 2020-07-01 VITALS — BP 136/82 | HR 66 | Ht 66.0 in | Wt 183.0 lb

## 2020-07-01 DIAGNOSIS — E782 Mixed hyperlipidemia: Secondary | ICD-10-CM

## 2020-07-01 DIAGNOSIS — E785 Hyperlipidemia, unspecified: Secondary | ICD-10-CM

## 2020-07-01 DIAGNOSIS — I10 Essential (primary) hypertension: Secondary | ICD-10-CM

## 2020-07-01 DIAGNOSIS — E039 Hypothyroidism, unspecified: Secondary | ICD-10-CM

## 2020-07-01 DIAGNOSIS — R931 Abnormal findings on diagnostic imaging of heart and coronary circulation: Secondary | ICD-10-CM | POA: Diagnosis not present

## 2020-07-01 DIAGNOSIS — M3130 Wegener's granulomatosis without renal involvement: Secondary | ICD-10-CM

## 2020-07-01 DIAGNOSIS — I6509 Occlusion and stenosis of unspecified vertebral artery: Secondary | ICD-10-CM

## 2020-07-01 DIAGNOSIS — Q231 Congenital insufficiency of aortic valve: Secondary | ICD-10-CM

## 2020-07-01 DIAGNOSIS — R072 Precordial pain: Secondary | ICD-10-CM | POA: Diagnosis not present

## 2020-07-01 DIAGNOSIS — I77810 Thoracic aortic ectasia: Secondary | ICD-10-CM | POA: Diagnosis not present

## 2020-07-01 MED ORDER — AMLODIPINE BESYLATE 2.5 MG PO TABS: 2.5000 mg | ORAL_TABLET | Freq: Every day | ORAL | 2 refills | Status: DC

## 2020-07-01 MED ORDER — METOPROLOL TARTRATE 100 MG PO TABS: 100.0000 mg | ORAL_TABLET | Freq: Once | ORAL | 0 refills | Status: DC

## 2020-07-01 NOTE — Patient Instructions (Addendum)
Medication Instructions:  Your physician has recommended you make the following change in your medication:   START Amlodipine 2.17m daily  *If you need a refill on your cardiac medications before your next appointment, please call your pharmacy*   Lab Work:  Your physician recommends that you return for FASTING lab work PRIOR to [facility] cardiac CT [facility], Lipid panel -  [facility] You will check in at the front desk to the right as you walk into the atrium. Valet Parking is offered if needed. - No appointment needed. You may go any day between 7 am and 6 pm.    Testing/Procedures:  1) Your physician has requested that you have an echocardiogram. Echocardiography is a painless test that uses sound waves to create images of your heart. It provides your doctor with information about the size and shape of your heart and how well your heart's chambers and valves are working. This procedure takes approximately one hour. There are no restrictions for this procedure.   2) Your cardiac CT will be scheduled at one of the below locations:   [facility] [id number]([phone] [place]  [phone] [facility] to test start time. [facility] (first floor) to check-in and test prep.  [facility] please arrive 15 mins early for check-in and test prep.  Please follow these instructions carefully (unless otherwise directed):  Hold all erectile dysfunction medications at least 3 days (72 hrs) prior to test.  On the Night Before the Test: . Be sure to Drink plenty of water. . Do not consume any  caffeinated/decaffeinated beverages or chocolate 12 hours prior to your test. . Do not take any antihistamines 12 hours prior to your test.   On the Day of the Test: . Drink plenty of water until 1 hour prior to the test. . Do not eat any food 4 hours prior to the test. . You may take your regular medications prior to the test.  . Take metoprolol (Lopressor) two hours prior to test. . HOLD Furosemide/Hydrochlorothiazide morning of the test.       After the Test: . Drink plenty of water. . After receiving IV contrast, you may experience a mild flushed feeling. This is normal. . On occasion, you may experience a mild rash up to 24 hours after the test. This is not dangerous. If this occurs, you can take Benadryl 25 mg and increase your fluid intake. . If you experience trouble breathing, this can be serious. If it is severe call 911 IMMEDIATELY. If it is mild, please call our office. . If you take any of these medications: Glipizide/Metformin, Avandament, Glucavance, please do not take 48 hours after completing test unless otherwise instructed.   Once we have confirmed authorization from your insurance company, we will call you to set up a date and time for your test. Based on how quickly your insurance processes prior authorizations requests, please allow up to 4 weeks to be contacted for scheduling your Cardiac CT appointment. Be advised that routine Cardiac CT appointments could be scheduled as many as 8 weeks after your provider has ordered it.  For  non-scheduling related questions, please contact the cardiac imaging nurse navigator should you have any questions/concerns: Marchia Bond, Cardiac Imaging Nurse Navigator Gordy Clement, Cardiac Imaging Nurse Navigator  Heart and V[facility] Services Direct Office Dial: [phone]   For scheduling needs, including cancellations and rescheduling, please call [place], [phone].    Follow-Up: At [facility], you and your  health needs are our priority.  As part of our continuing mission to provide you with exceptional heart care, we have created designated Provider Care Teams.  These Care Teams include your primary Cardiologist (physician) and Advanced Practice Providers (APPs -  Physician Assistants and Nurse Practitioners) who all work together to provide you with the care you need, when you need it.  We recommend signing up for the patient portal called "MyChart".  Sign up information is provided on this After Visit Summary.  MyChart is used to connect with patients for Virtual Visits (Telemedicine).  Patients are able to view lab/test results, encounter notes, upcoming appointments, etc.  Non-urgent messages can be sent to your provider as well.   To learn more about what you can do with MyChart, go to [email or web address].    Your next appointment:    Follow up after Echo and Cardiac CT  The format for your next appointment:   In Person  Provider:   You may see Dr. Rockey Situ or one of the following Advanced Practice Providers on your designated Care Team:     Marrianne Mood, PA-C     Other Instructions  Information About Your Aneurysm  The word "aneurysm" refers to a bulge in an artery (blood vessel). Most people think of them in the context of an emergency, but yours was found incidentally. At this point there is nothing you need to do from a procedure standpoint, but there are some important things to keep in mind for day-to-day life.  Mainstays of therapy for aneurysms include very good blood pressure control, healthy lifestyle, and avoiding tobacco products and street drugs. Research has raised concern that antibiotics in the fluoroquinolone class could be associated with increased risk of having an aneurysm develop or tear. This includes medicines that end in "floxacin," like Cipro or Levaquin. Make sure to discuss this information with other healthcare providers if you require  antibiotics.  Since aneurysms can run in families, you should discuss your diagnosis with first degree relatives as they may need to be screened for this. Regular mild-moderate physical exercise is important, but avoid heavy lifting/weight lifting over 30lbs, chopping wood, shoveling snow or digging heavy earth with a shovel. It is best to avoid activities that cause grunting or straining (medically referred to as a "Valsalva maneuver"). This happens when a person bears down against a closed throat to increase the strength of arm or abdominal muscles. There's often a tendency to do this when lifting heavy weights, doing sit-ups, push-ups or chin-ups, etc., but it may be harmful.  This is a finding I would expect to be monitored periodically by your cardiology team. Most unruptured thoracic aortic aneurysms cause no symptoms, so they are often found during exams for other conditions. Contact a health care provider if you develop any discomfort in your upper back, neck, abdomen, trouble swallowing, cough or hoarseness, or unexplained weight loss. Get help right away if you develop severe pain in your upper back or abdomen that may move into your chest and arms, or any other concerning symptoms such as shortness of breath or fever.  Bicuspid Valve: Your echo shows an  aortic bicuspid valve. Normally, the aortic valve has three leaflets. 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  Over time, we've learned that those with bicuspid valves are also prone to have weakened walls of their aorta; therefore, if blood pressure is high, over time, this weak tissue may dilate or tear with higher pressures. To prevent this, we ensure good blood pressure control and healthy lifestyle. Research has also shown antibiotics in the fluoroquinolone class  are associated with an increased risk of developing an aneurysm or having it develop a tear. Therefore, we advise  you to stay away from those antibiotics if at all possible, given you will want to avoid any further increased risk of weakening those walls. Antibiotics in this class end in "floxacin," like Cipro or Levaquin. Make sure to discuss this information with other healthcare providers if you require antibiotics. Avoid heavy lifting as well.    We will plan to monitor with serial EKGs, echocardiograms, and stress tests. Another finding in those with bicuspid valves is that the aortic valve itself becomes leaky or stiff easier than that of three leaflet valves (due to increased wear and tear). This puts further stress on your heart and vessels, which can lead to further vessel / heart dilation and also thickening of that muscle in the bottom left chamber of your heart (as with elevated blood pressure). So, we will control your blood pressure and your cholesterol tightly.   If the bicuspid valve becomes stiff or leaky due to wear and tear, this can cause symptoms such as chest pain and shortness of breath with exertion. Other symptoms are associated with a dissection. Contact a health care provider if you develop any discomfort in your upper back, neck, abdomen, trouble swallowing, cough or hoarseness, or unexplained weight loss. Get help right away if you develop severe pain in your upper back or abdomen that may move into your chest and arms, or any other concerning symptoms such as shortness of breath or fever. These are signs of a dissection.   [email or web address]   Blood pressure: --We recommend upper arm BP cuffs over that of the wrist. --You can always check your device's accuracy against an office model once a year if possible. You can do so by bringing your cuff into the office and notifying the person that rooms you that you would like to check your cuff against our office readings. --Measure your BP at the same time each day. Blood  pressure varies often throughout the day with higher readings in the morning. BP may also be lower at home than in the office. Do not measure BP right after you wake up or after exercising. Avoid caffeine, tobacco, and alcohol for 30 minutes before taking a measurement.  --Sit quietly for five minutes in a comfortable position with your legs and ankles uncrossed and back supported. Feet flat on the ground. Have your arm supported and at the level of your heart. Always use the same arm when taking your blood pressure. Place the cuff over bare skin rather than clothing. Each time you measure, take an additional reading if abnormal to ensure accurate by waiting 1-3 minutes after the first reading. --Please bring a BP log into the office. It is helpful to document the time of each BP reading, as well as any activity or medications taken around the reading. In addition, it is helpful to include heart rate at the time of the  BP reading. Daily weights are also encouraged. --Goal BP is 130/80 or lower. If your BP is low but you are not dizzy, this is fine and preferred over BP higher than 130/80. If your BP is less than 100 for the top number and you are dizzy, call the office. If your blood pressure is consistently elevated with top number above 180 and bottom above 120, this can damage the body. If you have severe increase in your blood pressure or concerning symptoms of severe chest pain, headache with confusion and blurred vision, severe abdominal or back pain, shortness of breath, seizures, or loss of consciousness, go to the emergency department.

## 2020-07-01 NOTE — Progress Notes (Signed)
Office Visit    Patient Name: Adrian Zavala Date of Encounter: 07/01/2020  PCP:  Olin Hauser, [place]  Cardiologist:  Ida Rogue, MD  Advanced Practice Provider:  No care team member to display Electrophysiologist:  None  :[id number]}   Chief Complaint    Chief Complaint  Patient presents with  . 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  Past Medical History    Past Medical History:  Diagnosis Date  . Arthritis    Spine, Left Hip, Hands  . 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[place])   . GERD (gastroesophageal reflux disease)   . Heart murmur   . Hypercholesteremia   . Hypothyroidism    graves  . Lung disorder    S/P chemo for Wegener's  . Neuromuscular disorder ([place])    some leg weakness S/P Chemo for Wegener's  . Osteopenia   . Osteoporosis    Femur  . Seasonal allergies   . Wegener's granulomatosis    Past Surgical History:  Procedure Laterality Date  . CHOLECYSTECTOMY     has "metal sutures"   . COLONOSCOPY N/A 10/25/2014   Procedure: COLONOSCOPY;  Surgeon: Lucilla Lame, MD;  Location: [facility];  Service: Gastroenterology;  Laterality: N/A;  . TREATMENT FISTULA ANAL     closure with rectal advancement flap    Allergies  Allergies  Allergen Reactions  . Sulfa Antibiotics Anaphylaxis  . Sulfamethoxazole-Trimethoprim Anaphylaxis  . Penicillins Other (See Comments)    Passes out  . Codeine Itching, Other (See Comments) and Rash    tingling Rash and dizziness  . Prilosec [Omeprazole] Hives  and Rash  . Tetracycline Rash  . Tetracyclines & Related Rash    History of Present Illness    Adrian Zavala is a 70 y.o. male with PMH as above. He works as a Scientist, research (medical) at [employer]. 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  Previous 2018 stress test without ischemia.  CT coronary calcium score of 497.  He has history of dilated ascending aorta at 4.1 cm in 2018.  He has history of Wegener's, chemotherapy 1991, and has been on methotrexate and prednisone.  Per previous documentation, he was in the [employer].  He was last seen by his primary cardiologist 05/30/2020.  At that time, no medication changes.  He was seen in the emergency department 06/10/2020.  He reported chest pain.  EKG without evidence of arrhythmia or ischemia.  Troponin negative.  Recommendation was for outpatient cardiac follow-up.  Today, 07/01/2020, he returns to clinic and recounts the above story.  He reports he is work and started to develop pain around the left ribs and then left arm pain.  He denies any chest pain.  He also reports that the left arm pain was just forearm pain and not a pain that started at the top of his arm.  In addition, he had left jaw/neck pain and pain in the left temple.  He reports that he has had pain in the muscles and around  the rib cage for 30+ years and at the start of his Wegener's (1991).  He does admit to myalgias that are attributed to comorbid conditions.  However, he did not want to overlook anything going on with his heart, which is the reason that he presented to the emergency department.  Today, he reports improvement since the above presentation.  He denies chest pain.  He reports improvement in his myalgias.  No racing heart rate or palpitations.  No presyncope or syncope.  No shortness of breath, orthopnea, PND, or early satiety.  He does note some dyspnea if climbing too many stairs, but is uncertain if this  is due to deconditioning and limitations from pain.  Denies any signs or symptoms of bleeding.  Reports medication compliance.  We discussed his history of headaches and that he has been seen by rheumatology and neurology with trigeminal neuralgia diagnosed in the past.  He does report left-sided face.  This time, however on review of EMR, right trigeminal neuralgia was reported.  He has a history of granulomatosis polyangiitis.  No reported signs or symptoms of volume overload.  Home Medications    Current Outpatient Medications on File Prior to Visit  Medication Sig Dispense Refill  . acetaminophen (TYLENOL) 650 MG CR tablet Take 350 mg by mouth every 8 (eight) hours as needed for pain or fever.     Marland Kitchen aspirin 81 MG tablet Take 81 mg by mouth daily. AM    . B Complex-C-Folic Acid (B-COMPLEX BALANCED PO) Take 1 tablet by mouth daily.    . calcium carbonate (OS-CAL) 600 MG TABS tablet Take 600 mg by mouth daily. AM    . ezetimibe (ZETIA) 10 MG tablet Take 1 tablet (10 mg total) by mouth daily. Please call office to schedule follow up appointment. 90 tablet 0  . famotidine (PEPCID) 40 MG tablet TAKE 1 TABLET BY MOUTH  DAILY 90 tablet 2  . fluticasone (FLONASE) 50 MCG/ACT nasal spray Place 2 sprays into both nostrils daily. Use for 4-6 weeks then stop and use seasonally or as needed. 16 g 3  . folic acid (FOLVITE) 161 MCG tablet Take 400 mcg by mouth daily. AM    . gabapentin (NEURONTIN) 100 MG capsule Take 200 mg by mouth 3 (three) times daily.    Marland Kitchen ibandronate (BONIVA) 150 MG tablet Take 150 mg by mouth every 30 (thirty) days.    Marland Kitchen levothyroxine (SYNTHROID) 25 MCG tablet TAKE 1 TABLET BY MOUTH  DAILY 90 tablet 3  . loratadine (CLARITIN) 10 MG tablet Take 10 mg by mouth daily as needed for allergies.     . magnesium oxide (MAG-OX) 400 MG tablet Take 400 mg by mouth daily.    . niacin (NIASPAN) 500 MG CR tablet Take 750 mg by mouth at bedtime.    . predniSONE (DELTASONE) 5 MG tablet Take 5 mg by  mouth daily.    . rosuvastatin (CRESTOR) 20 MG tablet Take 1 tablet (20 mg total) by mouth daily. Please call office to schedule follow up appointment. 90 tablet 0  . tiZANidine (ZANAFLEX) 4 MG tablet Take 2 mg by mouth 3 (three) times daily as needed.    . traMADol (ULTRAM) 50 MG tablet Take 1 tablet (50 mg total) by mouth every 6 (six) hours as needed for moderate pain or severe pain. 120 tablet 2  . triamcinolone ointment (KENALOG) 0.5 % Apply 1 application topically 2 (two) times daily as needed. For up to 2 weeks at a time,  then may repeat 30 g 2  . Vitamin D, Cholecalciferol, 400 UNITS CAPS Take 400 Units by mouth daily.     No current facility-administered medications on file prior to visit.    Review of Systems    He denies chest pain, palpitations, pnd, orthopnea, n, v, dizziness, syncope, edema or early satiety.  He reports left forearm pain and rib pain at the time of his ED presentation with long history of myalgias attributed to comorbid conditions.   He reports dyspnea on stairs, possibly attributed to deconditioning.  He reports left jaw, face, and shoulder pain.  He wonders how much his trigeminal neuralgia contributes to his symptoms.  He reports gradual weight gain over the years due to a sedentary lifestyle.  All other systems reviewed and are otherwise negative except as noted above.  Physical Exam    VS:  BP 136/82   Pulse 66   Ht 5' 6"  (1.676 m)   Wt 183 lb (83 kg)   BMI 29.54 kg/m  , BMI Body mass index is 29.54 kg/m. GEN: Well nourished, well developed, in no acute distress.  Facemask in place. HEENT: normal. Neck: Supple, JVD difficult to assess due to body habitus.  No carotid bruits or masses. Cardiac: RRR, 1/6 systolic murmur RUSB.  No rubs, or gallops. No clubbing, cyanosis.  Mild bilateral dependent edema.  Radials/DP/PT 2+ and equal bilaterally.  Respiratory:  Respirations regular and unlabored, clear to auscultation bilaterally. GI: Soft, nontender,  nondistended, BS + x 4. MS: no deformity or atrophy. Skin: warm and dry, no rash. Neuro:  Strength and sensation are intact. Psych: Normal affect.  Accessory Clinical Findings    ECG personally reviewed by me today -NSR, 66 bpm, IVCD, incomplete right bundle branch block, nonspecific T wave inversion- no acute changes.  VITALS Reviewed today   Temp Readings from Last 3 Encounters:  06/10/20 98 F (36.7 C) (Oral)  12/09/19 (!) 97.5 F (36.4 C) (Temporal)  06/08/19 97.7 F (36.5 C) (Temporal)   BP Readings from Last 3 Encounters:  06/10/20 (!) 157/77  06/07/20 125/70  05/30/20 138/70   Pulse Readings from Last 3 Encounters:  06/10/20 62  06/07/20 84  05/30/20 98    Wt Readings from Last 3 Encounters:  06/10/20 177 lb (80.3 kg)  06/07/20 186 lb 9.6 oz (84.6 kg)  05/30/20 182 lb 6 oz (82.7 kg)     LABS  reviewed today   Lab Results  Component Value Date   WBC 11.2 (H) 06/10/2020   HGB 14.1 06/10/2020   HCT 42.9 06/10/2020   MCV 96.0 06/10/2020   PLT 198 06/10/2020   Lab Results  Component Value Date   CREATININE 0.99 06/10/2020   BUN 32 (H) 06/10/2020   NA 137 06/10/2020   K 4.4 06/10/2020   CL 107 06/10/2020   CO2 24 06/10/2020   Lab Results  Component Value Date   ALT 21 12/09/2019   AST 18 12/09/2019   ALKPHOS 66 12/09/2019   BILITOT 0.6 12/09/2019   Lab Results  Component Value Date   CHOL 174 10/28/2019   HDL 54 10/28/2019   LDLCALC 81 10/28/2019   TRIG 237 (A) 10/28/2019   CHOLHDL 2.6 12/08/2018    Lab Results  Component Value Date   HGBA1C 5.7 10/28/2019   Lab Results  Component Value Date   TSH 1.090 12/09/2019     STUDIES/PROCEDURES reviewed today   Echo 06/10/2017 - Left ventricle: The cavity size was normal. Wall thickness  was  normal. Systolic function was normal. The estimated ejection  fraction was in the range of 60% to 65%. Wall motion was normal;  there were no regional wall motion abnormalities. Left   ventricular diastolic function parameters were normal.  - Aortic valve: Possibly bicuspid; normal thickness, mildly  calcified leaflets. There was very mild stenosis. Mean gradient  (S): 5 mm Hg. Valve area (VTI): 2.22 cm^2.  - Aorta: Ascending aortic diameter: 39 mm (S).  MPI 05/14/2016  Blood pressure demonstrated a normal response to exercise.  Horizontal ST segment depression ST segment depression of 1 mm was noted during stress in the V4, V5, V6 and V3 leads. These changes are suggestive of ischemia but there was motion artifact.  No T wave inversion was noted during stress.  The study is normal.  This is a low risk study.  The left ventricular ejection fraction is normal (55-65%).   Coronary artery calcium score 04/2016 IMPRESSION: 1. Coronary calcium score of 497. This was 93 percentile for age and sex matched control. This is a high score and further functional study with a stress test is recommended. 2. Calcified aortic valve. 3. Aneurysmal dilatation of the ascending aorta in the axial images measuring 41 mm. Further evaluation with CTA/MRA is recommended.  Assessment & Plan    Atypical chest pain History of CAD by CT/CAC score 497 --Reports recent atypical chest pain as above in HPI.  Presented to the ED with work-up unremarkable.  Previous work-up from 2018 with CAC score 497 and MPI low risk.  CAC noted on LAD and RCA.  Echo 2019 with normal EF, NRWMA.  He has a history of myalgias, given his comorbid conditions.  Chest pain described is atypical; however, cannot completely rule out chest pain due to coronary insufficiency given risk factors, as well as chest pain due to valvular function with previous visualized bicuspid valve, elevated BP, or elevated heart pressures.  Given we also need an updated ascending aorta measurement, recommendation was for coronary CTA at this time.  We will also obtain an echo to reassess his valvular function.  Continue current  medications, including ASA.  Will add amlodipine for antianginal effect.  Could consider Imdur at RTC.  Reassess at RTC.  Aggressive risk factor modification discussed.  Essential hypertension, BP goal 130/80 or lower --BP borderline today.  Will start low-dose amlodipine 2.5 mg.  Informed him that possible side effects include lower extremity edema with patient understanding.  Reassess at RTC.  Monitor BP at home with goal BP 130/80 or lower.  Reviewed restrictions for total daily intake under 2 L/day and total salt under 2 g/day.  Heart healthy diet and increased activity as tolerated reviewed.   HLD, LDL goal below 70 --Continue Crestor 20 mg and Zetia.  Will repeat lipid and liver function today.  If LDL not at goal, further recommendations at that time.  Bicuspid aortic valve Mild to moderately dilated ascending aorta (2018, 4.1 cm) --As above, high suspicion of bicuspid aortic valve given longstanding murmur, calcified aortic valve, and previous echoes with appearance of bicuspid valve.  Recommend repeat coronary CT as above.  Previous CT 4.1 cm.  If this is not sufficient for visualization of valve, consider TEE.  Faint murmur appreciated on exam today.  Will update echo to reassess valvular disease /EF, as well as look at wall motion, chamber size, heart pressures, and rule out acute structural abnormalities.  Reviewed recommendation to avoid fluoroquinolones and heavy lifting.  Of note, he  does have eyedrops that are from this family of medications-he can likely finish out his current regimen, given this is only for a short while longer.  Discussed recommendation for periodic evaluation on an annual basis, possibly more frequently depending on this updated study.  Strict heart rate, blood pressure, and cholesterol control recommended.  Vascular disease/vertebral artery stenosis --We will need to continue to monitor.  Recommend risk factor modification with cholesterol control, ASA, heart rate,  and blood pressure control.  At RTC, consider referral to [facility] if needed given vertebral artery stenosis and need for periodic repeat studies.  Medication changes: Start amlodipine 2.5 mg daily Labs ordered: BMET, LFTs, lipids Studies / Imaging ordered: Coronary CT, echo Future considerations: Pending studies/labs Disposition: RTC after coronary CT    Arvil Chaco, PA-C 07/01/2020

## 2020-07-07 ENCOUNTER — Encounter: Payer: Self-pay | Admitting: Cardiovascular Disease

## 2020-07-07 ENCOUNTER — Other Ambulatory Visit: Payer: Self-pay | Admitting: Physician Assistant

## 2020-07-08 LAB — COMPREHENSIVE METABOLIC PANEL
ALT: 24 IU/L (ref 0–44)
AST: 19 IU/L (ref 0–40)
Albumin/Globulin Ratio: 2.3 — ABNORMAL HIGH (ref 1.2–2.2)
Albumin: 4.5 g/dL (ref 3.8–4.8)
Alkaline Phosphatase: 62 IU/L (ref 44–121)
BUN/Creatinine Ratio: 20 (ref 10–24)
BUN: 24 mg/dL (ref 8–27)
Bilirubin Total: 0.6 mg/dL (ref 0.0–1.2)
CO2: 24 mmol/L (ref 20–29)
Calcium: 10.2 mg/dL (ref 8.6–10.2)
Chloride: 104 mmol/L (ref 96–106)
Creatinine, Ser: 1.23 mg/dL (ref 0.76–1.27)
Globulin, Total: 2 g/dL (ref 1.5–4.5)
Glucose: 79 mg/dL (ref 65–99)
Potassium: 4.4 mmol/L (ref 3.5–5.2)
Sodium: 141 mmol/L (ref 134–144)
Total Protein: 6.5 g/dL (ref 6.0–8.5)
eGFR: 64 mL/min/{1.73_m2} (ref >59)

## 2020-07-08 LAB — LIPID PANEL
Chol/HDL Ratio: 2.6 ratio (ref 0.0–5.0)
Cholesterol, Total: 164 mg/dL (ref 100–199)
HDL: 63 mg/dL (ref >39)
LDL Chol Calc (NIH): 69 mg/dL (ref 0–99)
Triglycerides: 194 mg/dL — ABNORMAL HIGH (ref 0–149)
VLDL Cholesterol Cal: 32 mg/dL (ref 5–40)

## 2020-07-18 ENCOUNTER — Telehealth: Payer: Self-pay | Admitting: *Deleted

## 2020-07-18 DIAGNOSIS — Z79899 Other long term (current) drug therapy: Secondary | ICD-10-CM

## 2020-07-18 DIAGNOSIS — E785 Hyperlipidemia, unspecified: Secondary | ICD-10-CM

## 2020-07-18 MED ORDER — ROSUVASTATIN CALCIUM 40 MG PO TABS: 40.0000 mg | ORAL_TABLET | Freq: Every day | ORAL | 3 refills | Status: DC

## 2020-07-18 NOTE — Telephone Encounter (Signed)
Spoke to pt. Notified of lab results and provider's recc.  Pt voiced understanding. He does agree to [employer] to 46m daily.  [facility]  [place] [facility]  Notified pt I will print lab order for pick up at front desk at next arrival.  Pt appreciative and has no further questions.

## 2020-07-18 NOTE — Telephone Encounter (Signed)
-----   Message from Arvil Chaco, PA-C sent at 07/15/2020  2:46 PM EDT ----- Coronary CTA BMET Kidney function stable, based on trended creatinine.  Electrolytes at goal.  LDL 69 and just meets goal of below 70. Triglycerides elevated above goal.   If agreeable, increase to Crestor 36m daily  Recheck lipids / LFTs in 6-8 weeks.

## 2020-07-22 ENCOUNTER — Other Ambulatory Visit (HOSPITAL_COMMUNITY): Payer: Self-pay | Admitting: Emergency Medicine

## 2020-07-22 DIAGNOSIS — I77810 Thoracic aortic ectasia: Secondary | ICD-10-CM

## 2020-07-22 NOTE — Progress Notes (Signed)
CTA chest aorta added to be completed in conjunction with coronary CTA for follow up eval of known aortic dilatation.   Marchia Bond RN Navigator Cardiac Imaging [facility] [phone] Office  [phone] Cell

## 2020-07-26 ENCOUNTER — Telehealth: Payer: Self-pay | Admitting: Physician Assistant

## 2020-07-26 NOTE — Telephone Encounter (Signed)
Patient has questions about upcoming ct .  He was not aware of it being ordered and wants to discuss.

## 2020-07-26 NOTE — Telephone Encounter (Signed)
Spoke with patient and he was not aware of both tests being ordered. CT Cardiac & CT Aorta. He knew about the CT Cardiac but not the other one. Reviewed chart and discussed the other test was ordered by provider. He inquired if these were covered by his insurance and if they would be billed for two tests. Advised he would be billed for each since they are separate studies but that I would need to call and check with billing to see if they have approval and a potential cost for each test. Informed patient that once I hear back from them I would be in touch with that information. He was appreciative for the call with no further questions for now.

## 2020-07-26 NOTE — Telephone Encounter (Signed)
Reviewed that authorization was obtained for one test and the other did not require it at this time. Advised that the billing team will reply with a rough estimate of his out of pocket expense but that if they don't reply by end of day then I would send message back to nurse for that provider and if he doesn't get a call back to please call me on Thursday. He verbalized understanding, was agreeable with plan, and had no further questions at this time.

## 2020-07-29 ENCOUNTER — Other Ambulatory Visit: Payer: Managed Care, Other (non HMO)

## 2020-07-29 NOTE — Telephone Encounter (Signed)
Verified with patient that estimate could be emailed.  He agreed and estimate emailed to patient today.

## 2020-08-02 ENCOUNTER — Other Ambulatory Visit: Payer: Self-pay | Admitting: Cardiovascular Disease

## 2020-08-02 ENCOUNTER — Telehealth (HOSPITAL_COMMUNITY): Payer: Self-pay | Admitting: *Deleted

## 2020-08-02 ENCOUNTER — Other Ambulatory Visit: Payer: Self-pay | Admitting: Family Medicine

## 2020-08-02 DIAGNOSIS — K219 Gastro-esophageal reflux disease without esophagitis: Secondary | ICD-10-CM

## 2020-08-02 NOTE — Telephone Encounter (Signed)
Reaching out to patient to offer assistance regarding upcoming cardiac imaging study; pt verbalizes understanding of appt date/time, parking situation and where to check in, pre-test NPO status and medications ordered, and verified current allergies; name and call back number provided for further questions should they arise  Gordy Clement RN Navigator Cardiac Imaging [facility] [phone] office [phone] cell  Pt to take 159m metoprolol tartrate 2 hours prior to cardiac CT scan.

## 2020-08-03 NOTE — Telephone Encounter (Signed)
Returned call to patient and answered questions to his satisfaction.

## 2020-08-03 NOTE — Telephone Encounter (Signed)
Spoke with patient and reviewed if he had received email and My Chart message with his potential cost out of pocket. He confirmed that he received both with no further questions at this time.

## 2020-08-03 NOTE — Telephone Encounter (Signed)
Left voicemail message to review email and My Chart message with instructions to call back if any further questions.

## 2020-08-03 NOTE — Telephone Encounter (Signed)
Patient calling to speak with  [facility] in billing.  Aware per request he will receive a call back .

## 2020-08-04 ENCOUNTER — Ambulatory Visit: Admission: RE | Admit: 2020-08-04 | Payer: Managed Care, Other (non HMO) | Source: Ambulatory Visit

## 2020-08-04 ENCOUNTER — Ambulatory Visit
Admission: RE | Admit: 2020-08-04 | Discharge: 2020-08-04 | Disposition: A | Discharge status: Home / Self Care | Payer: Managed Care, Other (non HMO) | Source: Ambulatory Visit | Attending: Physician Assistant | Admitting: Physician Assistant

## 2020-08-04 ENCOUNTER — Other Ambulatory Visit: Payer: Self-pay

## 2020-08-04 DIAGNOSIS — I77810 Thoracic aortic ectasia: Secondary | ICD-10-CM | POA: Insufficient documentation

## 2020-08-04 DIAGNOSIS — R072 Precordial pain: Secondary | ICD-10-CM | POA: Insufficient documentation

## 2020-08-04 DIAGNOSIS — I2510 Atherosclerotic heart disease of native coronary artery without angina pectoris: Secondary | ICD-10-CM

## 2020-08-04 MED ORDER — IOHEXOL 350 MG/ML SOLN
150.0000 mL | Freq: Once | INTRAVENOUS | Status: AC | PRN
  Administered 2020-08-04: 150 mL via INTRAVENOUS

## 2020-08-04 MED ORDER — NITROGLYCERIN 0.4 MG SL SUBL
0.8000 mg | SUBLINGUAL_TABLET | Freq: Once | SUBLINGUAL | Status: AC
  Administered 2020-08-04: 0.8 mg via SUBLINGUAL

## 2020-08-04 MED ORDER — IOHEXOL 350 MG/ML SOLN: 150.0000 mL | Freq: Once | INTRAVENOUS | Status: DC | PRN

## 2020-08-04 NOTE — Progress Notes (Signed)
Patient tolerated procedure well. Ambulate w/o difficulty. Sitting in chair drinking water provided. Encouraged to drink extra water today and reasoning explained. Verbalized understanding. All questions answered. ABC intact. No further needs. Discharge from procedure area w/o issues.  

## 2020-08-09 DIAGNOSIS — R931 Abnormal findings on diagnostic imaging of heart and coronary circulation: Secondary | ICD-10-CM

## 2020-08-09 DIAGNOSIS — I2510 Atherosclerotic heart disease of native coronary artery without angina pectoris: Secondary | ICD-10-CM | POA: Diagnosis not present

## 2020-08-10 ENCOUNTER — Telehealth: Payer: Self-pay

## 2020-08-10 NOTE — Telephone Encounter (Signed)
Able to reach pt regarding his recent CTA, Florestine Avers, PA-C had a chance to review his results and advised   "--FFR analysis came back without significant narrowing of the vessels.  --In the right coronary artery, this analysis could not be performed due to artifact.   Recommendations:  --We will reassess symptoms at upcoming clinic visit."  Pt is grateful for the call of resutls, will have his echo as schedule on 5/23 and will f/u with Mickle Plumb on 5/27 to discuss both results more in details and answer any additional questions and concerns. Otherwise no additional concerns at this time. Plan to keep echo and f/u appt.

## 2020-08-15 ENCOUNTER — Other Ambulatory Visit: Payer: Self-pay

## 2020-08-15 ENCOUNTER — Ambulatory Visit (INDEPENDENT_AMBULATORY_CARE_PROVIDER_SITE_OTHER): Payer: Managed Care, Other (non HMO)

## 2020-08-15 DIAGNOSIS — I5189 Other ill-defined heart diseases: Secondary | ICD-10-CM

## 2020-08-15 DIAGNOSIS — Q231 Congenital insufficiency of aortic valve: Secondary | ICD-10-CM

## 2020-08-15 DIAGNOSIS — I77810 Thoracic aortic ectasia: Secondary | ICD-10-CM | POA: Diagnosis not present

## 2020-08-15 HISTORY — DX: Other ill-defined heart diseases: I51.89

## 2020-08-15 MED ORDER — PERFLUTREN LIPID MICROSPHERE
1.0000 mL | INTRAVENOUS | Status: AC | PRN
  Administered 2020-08-15: 2 mL via INTRAVENOUS

## 2020-08-16 LAB — ECHOCARDIOGRAM COMPLETE
AR max vel: 3.17 cm2
AV Area VTI: 2.88 cm2
AV Area mean vel: 2.93 cm2
AV Mean grad: 2 mmHg
AV Peak grad: 4 mmHg
Ao pk vel: 1 m/s
Area-P 1/2: 2.94 cm2
S' Lateral: 2.9 cm

## 2020-08-19 ENCOUNTER — Other Ambulatory Visit: Payer: Self-pay

## 2020-08-19 ENCOUNTER — Ambulatory Visit: Payer: Managed Care, Other (non HMO) | Admitting: Physician Assistant

## 2020-08-19 ENCOUNTER — Encounter: Payer: Self-pay | Admitting: Physician Assistant

## 2020-08-19 VITALS — BP 130/68 | HR 80 | Ht 66.0 in | Wt 186.0 lb

## 2020-08-19 DIAGNOSIS — I10 Essential (primary) hypertension: Secondary | ICD-10-CM

## 2020-08-19 DIAGNOSIS — I77810 Thoracic aortic ectasia: Secondary | ICD-10-CM

## 2020-08-19 DIAGNOSIS — E785 Hyperlipidemia, unspecified: Secondary | ICD-10-CM | POA: Diagnosis not present

## 2020-08-19 DIAGNOSIS — E781 Pure hyperglyceridemia: Secondary | ICD-10-CM

## 2020-08-19 DIAGNOSIS — R931 Abnormal findings on diagnostic imaging of heart and coronary circulation: Secondary | ICD-10-CM | POA: Diagnosis not present

## 2020-08-19 DIAGNOSIS — I6509 Occlusion and stenosis of unspecified vertebral artery: Secondary | ICD-10-CM

## 2020-08-19 DIAGNOSIS — I5032 Chronic diastolic (congestive) heart failure: Secondary | ICD-10-CM

## 2020-08-19 DIAGNOSIS — I25118 Atherosclerotic heart disease of native coronary artery with other forms of angina pectoris: Secondary | ICD-10-CM

## 2020-08-19 MED ORDER — AMLODIPINE BESYLATE 5 MG PO TABS: 5.0000 mg | ORAL_TABLET | Freq: Every day | ORAL | 3 refills | Status: DC

## 2020-08-19 NOTE — Patient Instructions (Signed)
Medication Instructions:   Your physician has recommended you make the following change in your medication:   INCREASE Amlodipine 7m daily  *If you need a refill on your cardiac medications before your next appointment, please call your pharmacy*   Lab Work:  None ordered   Testing/Procedures: None ordered   Follow-Up: [facility] and your [facility] needs are our [facility].  As part of our continuing mission to provide you with exceptional heart care, we have created designated Provider Care Teams.  These Care Teams include your primary Cardiologist (physician) and Advanced Practice Providers (APPs -  Physician Assistants and Nurse Practitioners) who all work together to provide you with the care you need, when you need it.  We recommend signing up for the patient portal called "MyChart".  Sign up information is provided on this After Visit Summary.  MyChart is used to connect with patients for Virtual Visits (Telemedicine).  Patients are able to view lab/test results, encounter notes, upcoming appointments, etc.  Non-urgent messages can be sent to your provider as well.   To learn more about what you can do with MyChart, go to [email or web address]    Your next appointment:   2 - 3 month(s)  The format for your next appointment:   In Person  Provider:   You may see TIda Rogue MD or one of the following Advanced Practice Providers on your designated Care Team:     JMarrianne Mood [place]

## 2020-08-19 NOTE — Progress Notes (Signed)
Office Visit    Patient Name: Adrian Zavala Date of Encounter: 08/19/2020  PCP:  Olin Hauser, [place]  Cardiologist:  Ida Rogue, MD  Advanced Practice Provider:  No care team member to display Electrophysiologist:  None  :[id number]}   Chief Complaint    Chief Complaint  Patient presents with  . Follow-up    Follow up to review echo and CT results. Medications verbally reviewed with patient.     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  Past Medical History    Past Medical History:  Diagnosis Date  . Arthritis    Spine, Left Hip, Hands  . 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[place])   . GERD (gastroesophageal reflux disease)   . Heart murmur   . Hypercholesteremia   . Hypothyroidism    graves  . Lung disorder    S/P chemo for Wegener's  . Neuromuscular disorder ([place])    some leg weakness S/P Chemo for Wegener's  . Osteopenia   . Osteoporosis    Femur  . Seasonal allergies   . Wegener's granulomatosis    Past Surgical History:  Procedure Laterality Date  . CHOLECYSTECTOMY     has "metal sutures"   . COLONOSCOPY N/A 10/25/2014   Procedure: COLONOSCOPY;  Surgeon: Lucilla Lame, MD;  Location: [facility];  Service: Gastroenterology;  Laterality: N/A;  . TREATMENT FISTULA ANAL     closure with rectal advancement flap    Allergies  Allergies  Allergen Reactions  . Sulfa Antibiotics Anaphylaxis  . Sulfamethoxazole-Trimethoprim Anaphylaxis  . Ampicillin   . Keflex [Cephalexin]   . Penicillins Other  (See Comments)    Passes out  . Quinolones     Fluoroquinolones not recommended in the setting of aortic dilation  . Codeine Itching, Other (See Comments) and Rash    tingling Rash and dizziness  . Prilosec [Omeprazole] Hives and Rash  . Tetracycline Rash  . Tetracyclines & Related Rash    History of Present Illness    Adrian Zavala is a 70 y.o. male with PMH as above. He works as a Scientist, research (medical) at [employer]. 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  Previous 2018 stress test without ischemia.  CT coronary calcium score of 497.  He has history of dilated ascending aorta at 4.1 cm in 2018.  He has history of Wegener's, chemotherapy 1991, and has been on methotrexate and prednisone.  Per previous documentation, he was in the [employer].  He was last seen by his primary cardiologist 05/30/2020.  At that time, no medication changes.  He was seen in the emergency department 06/10/2020.  He reported chest pain.  EKG without evidence of arrhythmia or ischemia.  Troponin negative.  Recommendation was for outpatient cardiac follow-up.  Seen 07/01/2020 and reported developing musculoskeletal pain around the left ribs and then into the left arm.   He reported the left arm pain was just forearm pain and not a pain that started at the top of his arm.  In addition he had jaw/neck  pain and pain in the left temple.  He reported that he had pain in the muscles and around the rib cage for 30+ years at the start of his Wegener's (1991).  He admitted to myalgias that he attributed to comorbid conditions.  Do not want to look anything going on with his heart, which was the reason that he presented to the emergency department.  At the time of follow-up, he noted improvement in his myalgias.  He did note some dyspnea if climbing too many stairs, but was uncertain if this is due to deconditioning /limitations from pain.  He reported a history of  headaches with trigeminal neuralgia diagnosed in the past, which she reported contributed to his earlier jaw/neck/left temple pain noted to be right-sided in the past.  He had a history of granulomatosis polyangiitis.  No reported signs or symptoms of volume overload.  Coronary CTA and echo ordered for further risk factor stratification. He was started on low dose amlodipine for antianginal effect and BP control.  Between visits, Crestor increased due to LDL above goal of 70.  Subsequent coronary CTA without acute intrathoracic abnormality and showing similar appearance of granulomatous disease.  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  Recommendation was for imaging follow-up by CTA or MRI on an annual basis.  Coronary artery calcium score was 975, placing him in the 87th percentile for age and sex matched control.  A calcified plaque causing moderate stenosis was noted in the RCA and proximal LAD.  CAD RADS 3.  Moderate stenosis.  Recommendation was for symptom guided anti-ischemic pharmacotherapy, as well as risk factor modification per guideline directed care.  Additional analysis with CT FFR was submitted and reported separately, given the stenosis noted as above.  On coronary CTA, aorta was noted to be 39 mm.  Aortic valve noted to be trileaflet/tricuspid.  Subsequent CT FFR showed no significant stenosis of the LAD with FFR 0.8.  Left circumflex FFR 0.9.  RCA FFR analysis was not able to be performed due to significant motion artifact.  Echo showed EF 60 to 65%, NR WMA, G1 DD, mild MR, moderate aortic sclerosis without evidence of stenosis, and aortic dilation 38 mm.  Today, 08/19/2020, he returns to clinic to review his coronary CTA.  Given his high coronary artery calcium score with proximal LAD stenosis, as well as his symptoms as described above, recommendation was for cardiac catheterization at this time.  On further discussion, the patient  preference is to defer catheterization.  He feels very strongly that his chest pain is musculoskeletal.  He reports chest pain since his earliest 43s and throughout [facility].  He reports experiencing pains in his ribs and stomach.  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  We discussed that you could have both musculoskeletal pain and coronary pain with patient preference to still defer.  He is aware of the risks of deferring his cardiac catheterization at this time.  He feels strongly that his symptoms are related to his comorbid disease.  He denies any signs or symptoms of volume overload.  No signs or symptoms of bleeding.  Return precautions given.  Home Medications    Current Outpatient Medications on File Prior to Visit  Medication Sig Dispense Refill  . acetaminophen (TYLENOL) 650 MG CR tablet Take 350 mg by mouth every 8 (eight) hours as needed for pain or fever.     Marland Kitchen aspirin 81 MG  tablet Take 81 mg by mouth daily. AM    . B Complex-C-Folic Acid (B-COMPLEX BALANCED PO) Take 1 tablet by mouth daily.    . calcium carbonate (OS-CAL) 600 MG TABS tablet Take 600 mg by mouth daily. AM    . ezetimibe (ZETIA) 10 MG tablet TAKE 1 TABLET BY MOUTH  DAILY 90 tablet 0  . famotidine (PEPCID) 40 MG tablet TAKE 1 TABLET BY MOUTH  DAILY 90 tablet 3  . fluticasone (FLONASE) 50 MCG/ACT nasal spray Place 2 sprays into both nostrils daily. Use for 4-6 weeks then stop and use seasonally or as needed. 16 g 3  . folic acid (FOLVITE) 323 MCG tablet Take 400 mcg by mouth daily. AM    . gabapentin (NEURONTIN) 100 MG capsule Take 200 mg by mouth 3 (three) times daily.    Marland Kitchen ibandronate (BONIVA) 150 MG tablet Take 150 mg by mouth every 30 (thirty) days.    Marland Kitchen levothyroxine (SYNTHROID) 25 MCG tablet TAKE 1 TABLET BY MOUTH  DAILY 90 tablet 3  . loratadine (CLARITIN) 10 MG tablet Take 10 mg by mouth daily as needed for allergies.     . magnesium oxide (MAG-OX) 400 MG tablet Take 400 mg by  mouth daily.    . niacin (NIASPAN) 500 MG CR tablet Take 750 mg by mouth at bedtime.    . predniSONE (DELTASONE) 5 MG tablet Take 5 mg by mouth daily.    . rosuvastatin (CRESTOR) 40 MG tablet Take 1 tablet (40 mg total) by mouth daily. 90 tablet 3  . tiZANidine (ZANAFLEX) 4 MG tablet Take 2 mg by mouth 3 (three) times daily as needed.    . traMADol (ULTRAM) 50 MG tablet Take 1 tablet (50 mg total) by mouth every 6 (six) hours as needed for moderate pain or severe pain. 120 tablet 2  . Vitamin D, Cholecalciferol, 400 UNITS CAPS Take 400 Units by mouth daily.    . metoprolol tartrate (LOPRESSOR) 100 MG tablet Take 1 tablet (100 mg total) by mouth once for 1 dose. Take TWO hours prior to CT procedure 1 tablet 0   No current facility-administered medications on file prior to visit.    Review of Systems    He reports ongoing chest pain, attributed to musculoskeletal pain.   He reports ongoing dyspnea as reported in the past on stairs, attributed to deconditioning.   He reports gradual weight gain over the years due to a sedentary lifestyle.  All other systems reviewed and are otherwise negative except as noted above.  Physical Exam    VS:  BP 130/68 (BP Location: Left Arm, Patient Position: Sitting, Cuff Size: Normal)   Pulse 80   Ht 5' 6"  (1.676 m)   Wt 186 lb (84.4 kg)   SpO2 98%   BMI 30.02 kg/m  , BMI Body mass index is 30.02 kg/m. GEN: Well nourished, well developed, in no acute distress.  Facemask in place. HEENT: normal. Neck: Supple, JVD difficult to assess due to body habitus.  No carotid bruits or masses. Cardiac: RRR, 2/6 systolic murmur RUSB.  No rubs, or gallops. No clubbing, cyanosis.  Mild nonpitting bilateral dependent edema.  Radials/DP/PT 2+ and equal bilaterally.  Respiratory:  Respirations regular and unlabored, clear to auscultation bilaterally. GI: Soft, nontender, nondistended, BS + x 4. MS: no deformity or atrophy. Skin: warm and dry, no rash. Neuro:  Strength and  sensation are intact. Psych: Normal affect.  Accessory Clinical Findings    ECG personally reviewed by  me today -deferred- no acute changes.  VITALS Reviewed today   Temp Readings from Last 3 Encounters:  06/10/20 98 F (36.7 C) (Oral)  12/09/19 (!) 97.5 F (36.4 C) (Temporal)  06/08/19 97.7 F (36.5 C) (Temporal)   BP Readings from Last 3 Encounters:  08/19/20 130/68  08/04/20 (!) 112/53  07/01/20 136/82   Pulse Readings from Last 3 Encounters:  08/19/20 80  08/04/20 63  07/01/20 66    Wt Readings from Last 3 Encounters:  08/19/20 186 lb (84.4 kg)  07/01/20 183 lb (83 kg)  06/10/20 177 lb (80.3 kg)     LABS  reviewed today   Lab Results  Component Value Date   WBC 11.2 (H) 06/10/2020   HGB 14.1 06/10/2020   HCT 42.9 06/10/2020   MCV 96.0 06/10/2020   PLT 198 06/10/2020   Lab Results  Component Value Date   CREATININE 1.23 07/07/2020   BUN 24 07/07/2020   NA 141 07/07/2020   K 4.4 07/07/2020   CL 104 07/07/2020   CO2 24 07/07/2020   Lab Results  Component Value Date   ALT 24 07/07/2020   AST 19 07/07/2020   ALKPHOS 62 07/07/2020   BILITOT 0.6 07/07/2020   Lab Results  Component Value Date   CHOL 164 07/07/2020   HDL 63 07/07/2020   LDLCALC 69 07/07/2020   TRIG 194 (H) 07/07/2020   CHOLHDL 2.6 07/07/2020    Lab Results  Component Value Date   HGBA1C 5.7 10/28/2019   Lab Results  Component Value Date   TSH 1.090 12/09/2019     STUDIES/PROCEDURES reviewed today   cCTA 07/2020 IMPRESSION: 1. Coronary calcium score of 975. This was 87th percentile for age and sex matched control. 2. Normal coronary origin with right dominance. 3. Calcified plaque causing moderate stenosis in the RCA and proximal LAD. 4. CAD-RADS 3. Moderate stenosis. Consider symptom-guided anti-ischemic pharmacotherapy as well as risk factor modification per guideline directed care. Additional analysis with CT FFR will be submitted and reported separately. 5. Mild  ascending aorta dilation, maximum diameter 70m. 6. Aortic valve is Trileaflet/tricuspid. 1. Left Main:  No significant stenosis. 2. LAD: No significant stenosis.  FFRct 0.8 3. LCX: No significant stenosis.  FFRct 0.9 4. RCA: FFRct analysis not performed due to significant motion artifact.  IMPRESSION: 1.  CT FFR analysis didn't show any significant stenosis.  2. FFRct analysis not performed in the RCA due to significant motion artifact.  Echo 08/15/20 1. Left ventricular ejection fraction, by estimation, is 60 to 65%. The  left ventricle has normal function. The left ventricle has no regional  wall motion abnormalities. Left ventricular diastolic parameters are  consistent with Grade I diastolic  dysfunction (impaired relaxation).  2. Right ventricular systolic function is normal. The right ventricular  size is normal. Tricuspid regurgitation signal is inadequate for assessing  PA pressure.  3. The mitral valve is normal in structure. Mild mitral valve  regurgitation.  4. The aortic valve is normal in structure. Moderate aortic valve  sclerosis/calcification is present, without any evidence of aortic  stenosis.  5. There is borderline dilatation of the ascending aorta, measuring 38  mm.   Echo 06/10/2017 - Left ventricle: The cavity size was normal. Wall thickness was  normal. Systolic function was normal. The estimated ejection  fraction was in the range of 60% to 65%. Wall motion was normal;  there were no regional wall motion abnormalities. Left  ventricular diastolic function parameters were normal.  -  Aortic valve: Possibly bicuspid; normal thickness, mildly  calcified leaflets. There was very mild stenosis. Mean gradient  (S): 5 mm Hg. Valve area (VTI): 2.22 cm^2.  - Aorta: Ascending aortic diameter: 39 mm (S).  MPI 05/14/2016  Blood pressure demonstrated a normal response to exercise.  Horizontal ST segment depression ST segment depression of 1  mm was noted during stress in the V4, V5, V6 and V3 leads. These changes are suggestive of ischemia but there was motion artifact.  No T wave inversion was noted during stress.  The study is normal.  This is a low risk study.  The left ventricular ejection fraction is normal (55-65%).   Coronary artery calcium score 04/2016 IMPRESSION: 1. Coronary calcium score of 497. This was 78 percentile for age and sex matched control. This is a high score and further functional study with a stress test is recommended. 2. Calcified aortic valve. 3. Aneurysmal dilatation of the ascending aorta in the axial images measuring 41 mm. Further evaluation with CTA/MRA is recommended.  Assessment & Plan    Atypical chest pain History of CAD by CT/CAC Coronary artery calcium score greater than 400 -- No current chest pain.  CAC score elevated at 975 with proximal LAD stenosis and FFR 0.89.  Also noted was moderate RCA stenosis with FFR unable to be determined due to significant motion artifact.  Left circumflex FFR 0.9.  Given the above, catheterization recommended and politely deferred per pt.   we discussed the significance of proximal LAD stenosis with patient preference to still defer his catheterization at this time.  He has a history of myalgias and attributes his CP to this history.  He understands that we cannot completely rule out musculoskeletal pain and chest pain due to coronary insufficiency given risk factors occurring at the same time, which we discussed at length today.  Echo with normal EF, NR WMA.  Given his preference to defer cardiac catheterization, return precautions given.  Continue current medications, including ASA.  He is tolerating his amlodipine well, and we will increase his dose for antianginal effect and BP control moving forward.  Could consider Imdur at RTC.  Risks and benefits of catheterization reviewed in the event that he wishes to proceed with catheterization in the future.   Aggressive risk factor modification discussed. LDL control as below. Continue ASA and CCB.   Essential hypertension, BP goal 130/80 or lower --BP borderline today but improved.  Will increase to amlodipine 5 mg daily.  He has tolerated amlodipine 2.5 mg daily very well, added at his previous clinic visit.  Reassess at RTC.  Monitor BP at home with goal BP 130/80 or lower.  Reviewed restrictions for total daily intake under 2 L/day and total salt under 2 g/day.  Heart healthy diet and increased activity as tolerated reviewed.   Diastolic dysfunction  -- Reports mild dyspnea as above and stable from previous visit with recommendation for right and left heart catheterization when agreeable to proceed with this procedure as above.  Euvolemic and well compensated on exam.  Echo as above.  Continue current medications.  Patient for standing diuretic at this time.  HLD, LDL goal below 70 Hypertriglyceridemia --Continue Crestor 40 mg and Zetia.  Crestor was increased between visits due to LDL above goal, which we discussed as a risk factor for CAD.  He is tolerating Crestor 40 mg daily well.  Recheck lipid and liver function in 6 to 8 weeks.  If triglycerides still elevated at that time, consider  Vascepa.  Dilated ascending aorta (07/2020, CT of aorta, 4.0 cm) -- Previously noted, there was suspicion of bicuspid aortic valve given longstanding murmur, calcified aortic valve, and previous echoes with appearance of bicuspid valve.  Coronary CTA as above with tricuspid valve and no bicuspid valve appreciated on echo.  CTA Aorta notes he does have continued aortic dilation, measured at 4.0 cm.  Reviewed recommendation to avoid fluoroquinolones and heavy lifting.  He is aware of the recommendation for annual CT/MR.  Strict heart rate, blood pressure, and cholesterol control recommended.  Continue recently increase Crestor 40 mg daily, as well as increased antihypertensives as above.  Vascular disease/vertebral  artery stenosis --We will need to continue to monitor.  Recommend risk factor modification with cholesterol control, ASA, heart rate, and blood pressure control.  At RTC, consider referral to [facility] if needed given vertebral artery stenosis and need for periodic repeat studies.  Medication changes: Increase to amlodipine 5 mg daily (between visits had increased to Crestor 40 mg daily) Labs ordered: None, lipid and liver in 6 to 8 weeks Studies / Imaging ordered: Deferred catheterization, per preference of patient, reassess at RTC. Future considerations: Catheterization, lipid and liver in 6 to 8 weeks, Vascepa? Disposition: RTC 2 to 3 months to reassess symptoms and repeat labs as above    Arvil Chaco, PA-C 08/19/2020

## 2020-09-19 ENCOUNTER — Emergency Department: Payer: Managed Care, Other (non HMO)

## 2020-09-19 ENCOUNTER — Other Ambulatory Visit: Payer: Self-pay

## 2020-09-19 ENCOUNTER — Emergency Department
Admission: EM | Admit: 2020-09-19 | Discharge: 2020-09-19 | Disposition: A | Discharge status: Home / Self Care | Payer: Managed Care, Other (non HMO) | Attending: Emergency Medicine | Admitting: Emergency Medicine

## 2020-09-19 DIAGNOSIS — I10 Essential (primary) hypertension: Secondary | ICD-10-CM | POA: Insufficient documentation

## 2020-09-19 DIAGNOSIS — Z87891 Personal history of nicotine dependence: Secondary | ICD-10-CM | POA: Diagnosis not present

## 2020-09-19 DIAGNOSIS — Z7982 Long term (current) use of aspirin: Secondary | ICD-10-CM | POA: Insufficient documentation

## 2020-09-19 DIAGNOSIS — M542 Cervicalgia: Secondary | ICD-10-CM | POA: Insufficient documentation

## 2020-09-19 DIAGNOSIS — E039 Hypothyroidism, unspecified: Secondary | ICD-10-CM | POA: Diagnosis not present

## 2020-09-19 DIAGNOSIS — Z79899 Other long term (current) drug therapy: Secondary | ICD-10-CM | POA: Diagnosis not present

## 2020-09-19 LAB — BASIC METABOLIC PANEL
Anion gap: 7 (ref 5–15)
BUN: 28 mg/dL — ABNORMAL HIGH (ref 8–23)
CO2: 22 mmol/L (ref 22–32)
Calcium: 9.8 mg/dL (ref 8.9–10.3)
Chloride: 108 mmol/L (ref 98–111)
Creatinine, Ser: 1.09 mg/dL (ref 0.61–1.24)
GFR, Estimated: >60 mL/min (ref >60)
Glucose, Bld: 127 mg/dL — ABNORMAL HIGH (ref 70–99)
Potassium: 3.7 mmol/L (ref 3.5–5.1)
Sodium: 137 mmol/L (ref 135–145)

## 2020-09-19 LAB — CBC WITH DIFFERENTIAL/PLATELET
Abs Immature Granulocytes: 0.11 10*3/uL — ABNORMAL HIGH (ref 0.00–0.07)
Basophils Absolute: 0.1 10*3/uL (ref 0.0–0.1)
Basophils Relative: 1 %
Eosinophils Absolute: 0.2 10*3/uL (ref 0.0–0.5)
Eosinophils Relative: 2 %
HCT: 39.3 % (ref 39.0–52.0)
Hemoglobin: 13.4 g/dL (ref 13.0–17.0)
Immature Granulocytes: 1 %
Lymphocytes Relative: 15 %
Lymphs Abs: 1.5 10*3/uL (ref 0.7–4.0)
MCH: 32.3 pg (ref 26.0–34.0)
MCHC: 34.1 g/dL (ref 30.0–36.0)
MCV: 94.7 fL (ref 80.0–100.0)
Monocytes Absolute: 0.7 10*3/uL (ref 0.1–1.0)
Monocytes Relative: 7 %
Neutro Abs: 7.5 10*3/uL (ref 1.7–7.7)
Neutrophils Relative %: 74 %
Platelets: 178 10*3/uL (ref 150–400)
RBC: 4.15 MIL/uL — ABNORMAL LOW (ref 4.22–5.81)
RDW: 13.2 % (ref 11.5–15.5)
WBC: 10.2 10*3/uL (ref 4.0–10.5)
nRBC: 0 % (ref 0.0–0.2)

## 2020-09-19 MED ORDER — DIAZEPAM 2 MG PO TABS: 2.0000 mg | ORAL_TABLET | Freq: Three times a day (TID) | ORAL | 0 refills | Status: DC | PRN

## 2020-09-19 MED ORDER — KETOROLAC TROMETHAMINE 30 MG/ML IJ SOLN
15.0000 mg | Freq: Once | INTRAMUSCULAR | Status: AC
Start: 2020-09-19 — End: 2020-09-19
  Administered 2020-09-19: 15 mg via INTRAVENOUS
  Filled 2020-09-19: qty 1

## 2020-09-19 MED ORDER — IOHEXOL 350 MG/ML SOLN
75.0000 mL | Freq: Once | INTRAVENOUS | Status: AC | PRN
  Administered 2020-09-19: 75 mL via INTRAVENOUS

## 2020-09-19 MED ORDER — CYCLOBENZAPRINE HCL 5 MG PO TABS: ORAL_TABLET | ORAL | 0 refills | Status: DC

## 2020-09-19 NOTE — Discharge Instructions (Addendum)
You may take Valium 3 times daily as needed for muscle spasms.  Apply moist heat to affected area several times daily.  Return to the ER for worsening symptoms, persistent vomiting, difficulty breathing or other concerns.

## 2020-09-19 NOTE — ED Triage Notes (Signed)
Pt states is having neck pain posteriorly that radiates up to posterior skull and around to right ear. Pt states he has carotid stenosis. Pt states took tramadol 2230. Pt without nuchal rigidity, denies photophobia and sound sensitivity. No rash, no fever.

## 2020-09-19 NOTE — ED Provider Notes (Signed)
[facility] Emergency Department Provider Note   ____________________________________________   Event Date/Time   First MD Initiated Contact with Patient 09/19/20 0250     (approximate)  I have reviewed the triage vital signs and the nursing notes.   HISTORY  Chief Complaint Headache and Neck Pain    HPI Adrian Zavala is a 70 y.o. male who presents to the ED from home with a chief complaint of neck pain.  Patient reports a 2-day history of right sided neck pain posteriorly that radiates up into his head and ear.  History of trigeminal neuralgia, Wegener's granulomatosis.  Has been taking his tramadol and gabapentin with partial relief of symptoms.  Describes sharp, hard pain.  Denies associated vision changes, chest pain, shortness of breath, abdominal pain, nausea, vomiting or dizziness.  Denies photophobia, facial droop, extremity weakness/numbness/tingling, altered mentation.     Past Medical History:  Diagnosis Date  . Arthritis    Spine, Left Hip, Hands  . 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[place])   . GERD (gastroesophageal reflux disease)   . Heart murmur   . Hypercholesteremia   . Hypothyroidism    graves  . Lung disorder    S/P chemo for Wegener's  . Neuromuscular disorder ([place])    some leg weakness S/P Chemo for Wegener's  . Osteopenia   . Osteoporosis    Femur  . Seasonal allergies   . Wegener's granulomatosis     Patient Active Problem List   Diagnosis Date Noted  . Bursitis of left hip 04/28/2018  . Elevated hemoglobin A1c 10/24/2017  . Osteoporosis 10/01/2016  . Irritable bowel syndrome (IBS) 10/01/2016  . Ascending aortic aneurysm ([place]) 10/01/2016  . Osteoarthritis of multiple joints 04/16/2016  . BPH (benign prostatic hyperplasia) 06/06/2015  . Muscle pain 06/06/2015  . Fatty liver disease, nonalcoholic 85/04/7739  . 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 Colitis 01/03/2015  . HLD (hyperlipidemia) 01/03/2015  . Essential  hypertension 01/03/2015  . Eye inflamed 01/03/2015  . Hypothyroidism 01/03/2015  . Bicuspid aortic valve 01/03/2015  . Special screening for malignant neoplasms, colon   . Benign neoplasm of sigmoid colon   . First degree hemorrhoids   . Arthritis, degenerative 06/03/2013    Past Surgical History:  Procedure Laterality Date  . CHOLECYSTECTOMY     has "metal sutures"   . COLONOSCOPY N/A 10/25/2014   Procedure: COLONOSCOPY;  Surgeon: Lucilla Lame, MD;  Location: [place];  Service: Gastroenterology;  Laterality: N/A;  . TREATMENT FISTULA ANAL     closure with rectal advancement flap    Prior to Admission medications   Medication Sig Start Date End Date Taking? Authorizing Provider  acetaminophen (TYLENOL) 650 MG CR tablet Take 350 mg by mouth every 8 (eight) hours as needed for pain or fever.     [provider]  amLODipine (NORVASC) 5 MG tablet Take 1 tablet (5 mg total) by mouth daily. 08/19/20 08/14/21  Marrianne Mood D, PA-C  aspirin 81 MG tablet Take 81 mg by mouth daily. AM    [provider]  B Complex-C-Folic Acid (B-COMPLEX BALANCED PO) Take 1 tablet by mouth daily.    [provider]  calcium carbonate (OS-CAL) 600 MG TABS tablet Take 600 mg by mouth daily. AM    [provider]  ezetimibe (ZETIA) 10 MG tablet TAKE 1 TABLET BY MOUTH  DAILY 08/02/20   Minna Merritts, MD  famotidine (PEPCID) 40 MG tablet TAKE 1 TABLET BY MOUTH  DAILY 08/02/20  Karamalegos, Alexander J, DO  fluticasone (FLONASE) 50 MCG/ACT nasal spray Place 2 sprays into both nostrils daily. Use for 4-6 weeks then stop and use seasonally or as needed. 10/27/18   Karamalegos, Devonne Doughty, DO  folic acid (FOLVITE) 270 MCG tablet Take 400 mcg by mouth daily. AM    [provider]  gabapentin (NEURONTIN) 100 MG capsule Take 200 mg by mouth 3 (three) times daily. 08/30/19   [provider]  ibandronate (BONIVA) 150 MG tablet Take 150 mg by mouth every 30  (thirty) days. 01/26/19   [provider]  levothyroxine (SYNTHROID) 25 MCG tablet TAKE 1 TABLET BY MOUTH  DAILY 03/12/20   Parks Ranger, Devonne Doughty, DO  loratadine (CLARITIN) 10 MG tablet Take 10 mg by mouth daily as needed for allergies.     [provider]  magnesium oxide (MAG-OX) 400 MG tablet Take 400 mg by mouth daily. 07/27/15   [provider]  metoprolol tartrate (LOPRESSOR) 100 MG tablet Take 1 tablet (100 mg total) by mouth once for 1 dose. Take TWO hours prior to CT procedure 07/01/20 07/01/20  Marrianne Mood D, PA-C  niacin (NIASPAN) 500 MG CR tablet Take 750 mg by mouth at bedtime.    [provider]  predniSONE (DELTASONE) 5 MG tablet Take 5 mg by mouth daily.    [provider]  rosuvastatin (CRESTOR) 40 MG tablet Take 1 tablet (40 mg total) by mouth daily. 07/18/20 07/13/21  Marrianne Mood D, PA-C  tiZANidine (ZANAFLEX) 4 MG tablet Take 2 mg by mouth 3 (three) times daily as needed.    [provider]  traMADol (ULTRAM) 50 MG tablet Take 1 tablet (50 mg total) by mouth every 6 (six) hours as needed for moderate pain or severe pain. 06/07/20   Karamalegos, Devonne Doughty, DO  Vitamin D, Cholecalciferol, 400 UNITS CAPS Take 400 Units by mouth daily.    [provider]    Allergies Sulfa antibiotics, Sulfamethoxazole-trimethoprim, Ampicillin, Keflex [cephalexin], Penicillins, Quinolones, Codeine, Prilosec [omeprazole], Tetracycline, and Tetracyclines & related  Family History  Family history unknown: Yes    Social History Social History   Tobacco Use  . Smoking status: Former    Pack years: 0.00    Types: Cigarettes    Quit date: 08/24/1996    Years since quitting: 24.0  . Smokeless tobacco: Former  Substance Use Topics  . Alcohol use: Yes    Alcohol/week: 3.0 standard drinks    Types: 3 Cans of beer per week  . Drug use: No    Review of Systems  Constitutional: No fever/chills Eyes: No visual changes. ENT:  No sore throat. Cardiovascular: Denies chest pain. Respiratory: Denies shortness of breath. Gastrointestinal: No abdominal pain.  No nausea, no vomiting.  No diarrhea.  No constipation. Genitourinary: Negative for dysuria. Musculoskeletal: Positive for right-sided neck pain.  Negative for back pain. Skin: Negative for rash. Neurological: Positive for headache. Negative for focal weakness or numbness.   ____________________________________________   PHYSICAL EXAM:  VITAL SIGNS: ED Triage Vitals  Enc Vitals Group     BP 09/19/20 0032 (!) 157/69     Pulse Rate 09/19/20 0032 73     Resp 09/19/20 0032 16     Temp 09/19/20 0032 99 F (37.2 C)     Temp Source 09/19/20 0032 Oral     SpO2 09/19/20 0032 100 %     Weight 09/19/20 0033 182 lb (82.6 kg)     Height 09/19/20 0033 5' 6"  (1.676 m)  Head Circumference --      Peak Flow --      Pain Score 09/19/20 0032 8     Pain Loc --      Pain Edu? --      Excl. in [place]? --     Constitutional: Alert and oriented. Well appearing and in no acute distress. Eyes: Conjunctivae are normal. PERRL. EOMI. Head: Atraumatic. Nose: No congestion/rhinnorhea. Mouth/Throat: Mucous membranes are moist.   Neck: No stridor.  No cervical spine tenderness to palpation.  No carotid bruits.  Supple neck without meningismus.  Right trapezius muscle spasms. Cardiovascular: Normal rate, regular rhythm. Grossly normal heart sounds.  Good peripheral circulation. Respiratory: Normal respiratory effort.  No retractions. Lungs CTAB. Gastrointestinal: Soft and nontender. No distention. No abdominal bruits. No CVA tenderness. Musculoskeletal: No lower extremity tenderness nor edema.  No joint effusions. Neurologic: Alert and oriented x3.  CN II to XII grossly intact.  Normal speech and language. No gross focal neurologic deficits are appreciated. No gait instability. Skin:  Skin is warm, dry and intact. No rash noted. Psychiatric: Mood and affect are normal. Speech  and behavior are normal.  ____________________________________________   LABS (all labs ordered are listed, but only abnormal results are displayed)  Labs Reviewed  CBC WITH DIFFERENTIAL/PLATELET - Abnormal; Notable for the following components:      Result Value   RBC 4.15 (*)    Abs Immature Granulocytes 0.11 (*)    All other components within normal limits  BASIC METABOLIC PANEL - Abnormal; Notable for the following components:   Glucose, Bld 127 (*)    BUN 28 (*)    All other components within normal limits   ____________________________________________  EKG  ED ECG REPORT I, Audrie Kuri J, the attending physician, personally viewed and interpreted this ECG.   Date: 09/19/2020  EKG Time: 0046  Rate: 71  Rhythm: normal EKG, normal sinus rhythm  Axis: Normal  Intervals:none  ST&T Change: Nonspecific  ____________________________________________  RADIOLOGY I, Kamri Gotsch J, personally viewed and evaluated these images (plain radiographs) as part of my medical decision making, as well as reviewing the written report by the radiologist.  ED MD interpretation: Unremarkable CT angio head and neck  Official radiology report(s): CT Angio Head W or Wo Contrast  Result Date: 09/19/2020 CLINICAL DATA:  Initial evaluation for acute neck pain.  She EXAM: CT ANGIOGRAPHY HEAD AND NECK TECHNIQUE: Multidetector CT imaging of the head and neck was performed using the standard protocol during bolus administration of intravenous contrast. Multiplanar CT image reconstructions and MIPs were obtained to evaluate the vascular anatomy. Carotid stenosis measurements (when applicable) are obtained utilizing NASCET criteria, using the distal internal carotid diameter as the denominator. CONTRAST:  67m OMNIPAQUE IOHEXOL 350 MG/ML SOLN COMPARISON:  Prior study from 11/22/2017. FINDINGS: CT HEAD FINDINGS Brain: Cerebral volume within normal limits for age. Scattered patchy hypodensity involving the  supratentorial cerebral white matter most likely related to chronic microvascular ischemic disease. No acute intracranial hemorrhage. No acute large vessel territory infarct. No mass lesion or midline shift. No hydrocephalus or extra-axial fluid collection. Vascular: No hyperdense vessel. Skull: Scalp soft tissues and calvarium within normal limits. Sinuses: Paranasal sinuses are largely clear.  No mastoid effusion. Orbits: Globes and orbital soft tissues demonstrate no acute finding. Review of the MIP images confirms the above findings CTA NECK FINDINGS Aortic arch: Visualized aortic arch normal in caliber with normal branch pattern. Mild atheromatous change about the arch and origin of the great vessels without significant  stenosis. Right carotid system: Right CCA patent from its origin to the bifurcation without significant stenosis. Mild atheromatous change about the right bifurcation without significant stenosis. Right ICA patent distally without stenosis, dissection or occlusion. Left carotid system: Left CCA patent from its origin to the bifurcation without stenosis. Mild mixed plaque about the left bifurcation without significant stenosis. Left ICA patent distally without stenosis, dissection or occlusion. Vertebral arteries: Both vertebral arteries arise from the subclavian arteries. Left vertebral artery strongly dominant with a diffusely hypoplastic right vertebral artery. Vertebral arteries patent without stenosis, dissection or occlusion. Skeleton: No visible acute osseous finding. No discrete or worrisome osseous lesions. Moderate spondylosis noted at C5-6. Other neck: No other acute soft tissue abnormality within the neck. No mass or adenopathy. Left thyroid lobe hypoplastic. Note made of a 7 mm right thyroid nodule, of doubtful significance given size and patient age, no follow-up imaging recommended (ref: J Am Coll Radiol. 2015 Feb;12(2): 143-50). Upper chest: Sequelae of prior granulomatous  infection with scattered calcified mediastinal and hilar nodes, with a few calcified granulomata within the lungs. Visualized upper chest demonstrates no acute finding. Review of the MIP images confirms the above findings CTA HEAD FINDINGS Anterior circulation: Both internal carotid arteries widely patent to the termini without stenosis or other abnormality. A1 segments patent bilaterally. Normal anterior communicating artery complex. Anterior cerebral arteries patent to their distal aspects without stenosis. No M1 stenosis or occlusion. Normal MCA bifurcations. Distal MCA branches well perfused and symmetric. Posterior circulation: Dominant left vertebral artery widely patent to the vertebrobasilar junction. Right V4 segment markedly hypoplastic but grossly patent as well. Both PICA origins patent and normal. Basilar patent to its distal aspect without stenosis. Superior cerebellar arteries patent bilaterally. Both PCAs primarily supplied via the basilar well perfused to their distal aspects. Venous sinuses: Grossly patent allowing for timing the contrast bolus. Anatomic variants: Strongly dominant left vertebral artery. No aneurysm or other vascular abnormality. Review of the MIP images confirms the above findings IMPRESSION: CT HEAD IMPRESSION: 1. No acute intracranial abnormality. 2. Mild to moderate chronic microvascular ischemic disease. CTA HEAD AND NECK IMPRESSION: 1. Negative CTA of the head and neck. No large vessel occlusion, dissection, hemodynamically significant stenosis, or other acute vascular abnormality. 2. Mild for age atheromatous change about the carotid bifurcations without significant stenosis. 3. Sequelae of prior granulomatous infection. Electronically Signed   By: Jeannine Boga M.D.   On: 09/19/2020 03:24   CT Angio Neck W and/or Wo Contrast  Result Date: 09/19/2020 CLINICAL DATA:  Initial evaluation for acute neck pain.  She EXAM: CT ANGIOGRAPHY HEAD AND NECK TECHNIQUE:  Multidetector CT imaging of the head and neck was performed using the standard protocol during bolus administration of intravenous contrast. Multiplanar CT image reconstructions and MIPs were obtained to evaluate the vascular anatomy. Carotid stenosis measurements (when applicable) are obtained utilizing NASCET criteria, using the distal internal carotid diameter as the denominator. CONTRAST:  13m OMNIPAQUE IOHEXOL 350 MG/ML SOLN COMPARISON:  Prior study from 11/22/2017. FINDINGS: CT HEAD FINDINGS Brain: Cerebral volume within normal limits for age. Scattered patchy hypodensity involving the supratentorial cerebral white matter most likely related to chronic microvascular ischemic disease. No acute intracranial hemorrhage. No acute large vessel territory infarct. No mass lesion or midline shift. No hydrocephalus or extra-axial fluid collection. Vascular: No hyperdense vessel. Skull: Scalp soft tissues and calvarium within normal limits. Sinuses: Paranasal sinuses are largely clear.  No mastoid effusion. Orbits: Globes and orbital soft tissues demonstrate no acute finding. Review  of the MIP images confirms the above findings CTA NECK FINDINGS Aortic arch: Visualized aortic arch normal in caliber with normal branch pattern. Mild atheromatous change about the arch and origin of the great vessels without significant stenosis. Right carotid system: Right CCA patent from its origin to the bifurcation without significant stenosis. Mild atheromatous change about the right bifurcation without significant stenosis. Right ICA patent distally without stenosis, dissection or occlusion. Left carotid system: Left CCA patent from its origin to the bifurcation without stenosis. Mild mixed plaque about the left bifurcation without significant stenosis. Left ICA patent distally without stenosis, dissection or occlusion. Vertebral arteries: Both vertebral arteries arise from the subclavian arteries. Left vertebral artery strongly  dominant with a diffusely hypoplastic right vertebral artery. Vertebral arteries patent without stenosis, dissection or occlusion. Skeleton: No visible acute osseous finding. No discrete or worrisome osseous lesions. Moderate spondylosis noted at C5-6. Other neck: No other acute soft tissue abnormality within the neck. No mass or adenopathy. Left thyroid lobe hypoplastic. Note made of a 7 mm right thyroid nodule, of doubtful significance given size and patient age, no follow-up imaging recommended (ref: J Am Coll Radiol. 2015 Feb;12(2): 143-50). Upper chest: Sequelae of prior granulomatous infection with scattered calcified mediastinal and hilar nodes, with a few calcified granulomata within the lungs. Visualized upper chest demonstrates no acute finding. Review of the MIP images confirms the above findings CTA HEAD FINDINGS Anterior circulation: Both internal carotid arteries widely patent to the termini without stenosis or other abnormality. A1 segments patent bilaterally. Normal anterior communicating artery complex. Anterior cerebral arteries patent to their distal aspects without stenosis. No M1 stenosis or occlusion. Normal MCA bifurcations. Distal MCA branches well perfused and symmetric. Posterior circulation: Dominant left vertebral artery widely patent to the vertebrobasilar junction. Right V4 segment markedly hypoplastic but grossly patent as well. Both PICA origins patent and normal. Basilar patent to its distal aspect without stenosis. Superior cerebellar arteries patent bilaterally. Both PCAs primarily supplied via the basilar well perfused to their distal aspects. Venous sinuses: Grossly patent allowing for timing the contrast bolus. Anatomic variants: Strongly dominant left vertebral artery. No aneurysm or other vascular abnormality. Review of the MIP images confirms the above findings IMPRESSION: CT HEAD IMPRESSION: 1. No acute intracranial abnormality. 2. Mild to moderate chronic microvascular  ischemic disease. CTA HEAD AND NECK IMPRESSION: 1. Negative CTA of the head and neck. No large vessel occlusion, dissection, hemodynamically significant stenosis, or other acute vascular abnormality. 2. Mild for age atheromatous change about the carotid bifurcations without significant stenosis. 3. Sequelae of prior granulomatous infection. Electronically Signed   By: Jeannine Boga M.D.   On: 09/19/2020 03:24    ____________________________________________   PROCEDURES  Procedure(s) performed (including Critical Care):  Procedures   ____________________________________________   INITIAL IMPRESSION / ASSESSMENT AND PLAN / ED COURSE  As part of my medical decision making, I reviewed the following data within the [facility] notes reviewed and incorporated, Labs reviewed, Old chart reviewed, Radiograph reviewed, and Notes from prior ED visits     70 year old male presenting with posterior neck and head pain Differential diagnosis includes, but is not limited to, intracranial hemorrhage, meningitis/encephalitis, previous head trauma, cavernous venous thrombosis, tension headache, temporal arteritis, migraine or migraine equivalent, idiopathic intracranial hypertension, and non-specific headache.   Laboratory results unremarkable.  Awaiting results of CTA head and neck.  Clinical Course as of 09/19/20 0408  Mon Sep 19, 2020  0359 Updated patient on CT imaging results.  Will  administer Toradol now as patient is driving.  Will discharge home with prescription for Valium.  Strict return precautions given.  Patient verbalizes understanding and agrees with plan of care. [JS]    Clinical Course User Index [JS] Paulette Blanch, MD     ____________________________________________   FINAL CLINICAL IMPRESSION(S) / ED DIAGNOSES  Final diagnoses:  Neck pain     ED Discharge Orders     None        Note:  This document was prepared using Dragon voice  recognition software and may include unintentional dictation errors.    Paulette Blanch, MD 09/19/20 [phone]

## 2020-10-08 ENCOUNTER — Other Ambulatory Visit: Payer: Self-pay | Admitting: Cardiovascular Disease

## 2020-11-15 ENCOUNTER — Other Ambulatory Visit: Payer: Self-pay | Admitting: Family Medicine

## 2020-11-15 DIAGNOSIS — M8949 Other hypertrophic osteoarthropathy, multiple sites: Secondary | ICD-10-CM

## 2020-11-15 DIAGNOSIS — M159 Polyosteoarthritis, unspecified: Secondary | ICD-10-CM

## 2020-11-15 MED ORDER — TRAMADOL HCL 50 MG PO TABS: 50.0000 mg | ORAL_TABLET | Freq: Four times a day (QID) | ORAL | 0 refills | Status: DC | PRN

## 2020-11-15 NOTE — Telephone Encounter (Signed)
Medication Refill - Medication: tramadol  Has the patient contacted their pharmacy? Yes per pt pharmacy told him to call md office. Pt next appt 12-19-2020 with dr Raliegh Ip   Preferred Pharmacy (with phone number or street name): [facility] [address] in [place] phone number [phone]  Agent: Please be advised that RX refills may take up to 3 business days. We ask that you follow-up with your pharmacy.

## 2020-11-15 NOTE — Telephone Encounter (Signed)
Requested medication (s) are due for refill today: no  Requested medication (s) are on the active medication list: yes   Last refill:  06/07/2020  Future visit scheduled: no  Notes to clinic:  this refill cannot be delegated    Requested Prescriptions  Pending Prescriptions Disp Refills   traMADol (ULTRAM) 50 MG tablet 120 tablet 2    Sig: Take 1 tablet (50 mg total) by mouth every 6 (six) hours as needed for moderate pain or severe pain.     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

## 2020-11-22 NOTE — Progress Notes (Signed)
Cardiology Office Note  Date:  11/23/2020   ID:  Adrian Zavala, DOB [birth year], MRN [id number]  PCP:  Olin Hauser, DO   Chief Complaint  Patient presents with   3 month follow up     "Doing well." Medications reviewed by the patient verbally.     HPI:  Adrian Zavala is a  70 year old gentleman with a history of  Wegener's, s/p chemo 1991,   Cushing's, on chronic prednisone,  long history of smoking who stopped in 1998,  hyperlipidemia heart murmur (Previous echocardiogram concerning for bicuspid aortic valve),  coronary artery disease, by CT  CT coronary calcium score 05/07/2016, Score of 497 Mild to moderately dilated ascending aorta 4.1 cm in 04/2016 Vertebral artery stenosis on MRI hyperlipidemia.  Who presents for follow-up of his dilated aorta, coronary calcification  Still working at [employer] to [place] to see family  Few hobbies No regular exercise Neuropathy right upper leg, head neuropathy On neurontin for pain , 200 TID Seen by rheumatology and neurology,  diagnosed with Right trigeminal neuralgia  history of Granulomatous polyangiitis  On prednisone for adrenal insuff, 5 mg daily   blood pressure well controlled at home Mildly elevated on today's check No chest pain, SOB on exertion   on Crestor 20 daily with Zetia Previous myalgias on Lipitor  EKG personally reviewed by myself on todays visit Normal sinus rhythm rate 69 bpm right bundle branch block   CT coronary calcium score 05/07/2016 Score of 497 calcified aortic valve, Dilated ascending aorta 4.1 cm  Echo 07/2020, reviewed  1. Left ventricular ejection fraction, by estimation, is 60 to 65%. The  left ventricle has normal function. The left ventricle has no regional  wall motion abnormalities. Left ventricular diastolic parameters are  consistent with Grade I diastolic  dysfunction (impaired relaxation).   2. Right ventricular systolic function is normal. The right  ventricular  size is normal. Tricuspid regurgitation signal is inadequate for assessing  PA pressure.  Mild mitral valve  regurgitation.   4. The aortic valve is normal in structure. Moderate aortic valve  sclerosis/calcification is present, without any evidence of aortic  stenosis.   5. There is borderline dilatation of the ascending aorta, measuring 38  mm.   Negative trigeminal protocol MRI of the brain, with no structural abnormality to explain patient's symptoms identified. 2. Age-appropriate cerebral atrophy with mild-to-moderate chronic microvascular ischemic disease, grossly similar to previous.  Echo 05/2017 Ascending aorta unchanged,  3.9 cm Discussed with him in detail, no significant change compared to CT scan 2018  ER 11/22/2017 Pain, tremors, lost feeling in scalp and feet Flushing, Tachycardia Previous allergy to bactrim  MRA: 2 mm suspected A-comm aneurysm, Moderate stenosis RIGHT vertebral artery origin.  Stress test 05/14/2016 performed Showing no ischemia normal ejection fraction   Wegener's, long history of chemotherapy, on chronic methotrexate once per week, on chronic prednisone   Reports he's been on cholesterol medication since the 1980s, when it first came out   Prior history and the [employer], Previous issues with gallbladder disease in his 55s   episode of nausea vomiting in November 2016  PMH:   has a past medical history of Arthritis, 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[place]), GERD (gastroesophageal reflux disease), Heart murmur, Hypercholesteremia, Hypothyroidism, Lung disorder, Neuromuscular disorder ([place]), Osteopenia, Osteoporosis, Seasonal allergies, and Wegener's granulomatosis.  PSH:    Past Surgical History:  Procedure Laterality Date   CHOLECYSTECTOMY     has "metal sutures"    COLONOSCOPY N/A 10/25/2014   Procedure:  COLONOSCOPY;  Surgeon: Lucilla Lame, MD;  Location: [facility];  Service: Gastroenterology;  Laterality: N/A;   TREATMENT FISTULA  ANAL     closure with rectal advancement flap    Current Outpatient Medications  Medication Sig Dispense Refill   acetaminophen (TYLENOL) 650 MG CR tablet Take 350 mg by mouth every 8 (eight) hours as needed for pain or fever.      amLODipine (NORVASC) 5 MG tablet Take 1 tablet (5 mg total) by mouth daily. 90 tablet 3   aspirin 81 MG tablet Take 81 mg by mouth daily. AM     B Complex-C-Folic Acid (B-COMPLEX BALANCED PO) Take 1 tablet by mouth daily.     calcium carbonate (OS-CAL) 600 MG TABS tablet Take 600 mg by mouth daily. AM     diazepam (VALIUM) 2 MG tablet Take 1 tablet (2 mg total) by mouth every 8 (eight) hours as needed for muscle spasms. 15 tablet 0   ezetimibe (ZETIA) 10 MG tablet TAKE 1 TABLET BY MOUTH  DAILY 90 tablet 0   famotidine (PEPCID) 40 MG tablet TAKE 1 TABLET BY MOUTH  DAILY 90 tablet 3   fluticasone (FLONASE) 50 MCG/ACT nasal spray Place 2 sprays into both nostrils daily. Use for 4-6 weeks then stop and use seasonally or as needed. 16 g 3   folic acid (FOLVITE) 829 MCG tablet Take 400 mcg by mouth daily. AM     gabapentin (NEURONTIN) 100 MG capsule Take 200 mg by mouth 3 (three) times daily.     ibandronate (BONIVA) 150 MG tablet Take 150 mg by mouth every 30 (thirty) days.     levothyroxine (SYNTHROID) 25 MCG tablet TAKE 1 TABLET BY MOUTH  DAILY 90 tablet 3   loratadine (CLARITIN) 10 MG tablet Take 10 mg by mouth daily as needed for allergies.      magnesium oxide (MAG-OX) 400 MG tablet Take 400 mg by mouth daily.     niacin (NIASPAN) 500 MG CR tablet Take 750 mg by mouth at bedtime.     predniSONE (DELTASONE) 5 MG tablet Take 5 mg by mouth daily.     rosuvastatin (CRESTOR) 40 MG tablet Take 1 tablet (40 mg total) by mouth daily. 90 tablet 3   tiZANidine (ZANAFLEX) 4 MG tablet Take 2 mg by mouth 3 (three) times daily as needed.     traMADol (ULTRAM) 50 MG tablet Take 1 tablet (50 mg total) by mouth every 6 (six) hours as needed for moderate pain or severe pain. 120  tablet 0   Vitamin D, Cholecalciferol, 400 UNITS CAPS Take 400 Units by mouth daily.     No current facility-administered medications for this visit.     Allergies:   Sulfa antibiotics, Sulfamethoxazole-trimethoprim, Ampicillin, Keflex [cephalexin], Penicillins, Quinolones, Codeine, Prilosec [omeprazole], Tetracycline, and Tetracyclines & related   Social History:  The patient  reports that he quit smoking about 24 years ago. His smoking use included cigarettes. He has quit using smokeless tobacco. He reports current alcohol use of about 3.0 standard drinks per week. He reports that he does not use drugs.   Family History:   Family history is unknown by patient.    Review of Systems: Review of Systems  Constitutional: Negative.   HENT: Negative.    Respiratory: Negative.    Cardiovascular: Negative.   Gastrointestinal: Negative.   Musculoskeletal:  Positive for joint pain.  Neurological: Negative.   Psychiatric/Behavioral: Negative.    All other systems reviewed and are negative.  PHYSICAL EXAM: VS:  BP (!) 150/82 (BP Location: Left Arm, Patient Position: Sitting, Cuff Size: Normal)   Pulse 69   Ht 5' 6"  (1.676 m)   Wt 184 lb 2 oz (83.5 kg)   SpO2 98%   BMI 29.72 kg/m  , BMI Body mass index is 29.72 kg/m. Constitutional:  oriented to person, place, and time. No distress.  HENT:  Head: Grossly normal Eyes:  no discharge. No scleral icterus.  Neck: No JVD, no carotid bruits  Cardiovascular: Regular rate and rhythm, no murmurs appreciated Pulmonary/Chest: Clear to auscultation bilaterally, no wheezes or rails Abdominal: Soft.  no distension.  no tenderness.  Musculoskeletal: Normal range of motion Neurological:  normal muscle tone. Coordination normal. No atrophy Skin: Skin warm and dry Psychiatric: normal affect, pleasant   Recent Labs: 12/09/2019: TSH 1.090 07/07/2020: ALT 24 09/19/2020: BUN 28; Creatinine, Ser 1.09; Hemoglobin 13.4; Platelets 178; Potassium 3.7;  Sodium 137    Lipid Panel Lab Results  Component Value Date   CHOL 164 07/07/2020   HDL 63 07/07/2020   LDLCALC 69 07/07/2020   TRIG 194 (H) 07/07/2020      Wt Readings from Last 3 Encounters:  11/23/20 184 lb 2 oz (83.5 kg)  09/19/20 182 lb (82.6 kg)  08/19/20 186 lb (84.4 kg)     ASSESSMENT AND PLAN:  Mixed hyperlipidemia Cholesterol is at goal on the current lipid regimen. No changes to the medications were made.   Essential hypertension Blood pressure is well controlled on today's visit. No changes made to the medications.  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 No further workup at this time. Continue current medication regimen.   Total encounter time more than 25 minutes  Greater than 50% was spent in counseling and coordination of care with the patient   No orders of the defined types were placed in this encounter.    Signed, Esmond Plants, M.D., Ph.D. 11/23/2020  [place], [place]

## 2020-11-23 ENCOUNTER — Ambulatory Visit (INDEPENDENT_AMBULATORY_CARE_PROVIDER_SITE_OTHER): Payer: Managed Care, Other (non HMO) | Admitting: Cardiovascular Disease

## 2020-11-23 ENCOUNTER — Other Ambulatory Visit: Payer: Self-pay

## 2020-11-23 ENCOUNTER — Encounter: Payer: Self-pay | Admitting: Cardiovascular Disease

## 2020-11-23 VITALS — BP 150/82 | HR 69 | Ht 66.0 in | Wt 184.1 lb

## 2020-11-23 DIAGNOSIS — I77810 Thoracic aortic ectasia: Secondary | ICD-10-CM

## 2020-11-23 DIAGNOSIS — I10 Essential (primary) hypertension: Secondary | ICD-10-CM | POA: Diagnosis not present

## 2020-11-23 DIAGNOSIS — I5032 Chronic diastolic (congestive) heart failure: Secondary | ICD-10-CM | POA: Diagnosis not present

## 2020-11-23 DIAGNOSIS — I25118 Atherosclerotic heart disease of native coronary artery with other forms of angina pectoris: Secondary | ICD-10-CM

## 2020-11-23 DIAGNOSIS — E785 Hyperlipidemia, unspecified: Secondary | ICD-10-CM | POA: Diagnosis not present

## 2020-11-23 DIAGNOSIS — I6509 Occlusion and stenosis of unspecified vertebral artery: Secondary | ICD-10-CM

## 2020-11-23 MED ORDER — AMLODIPINE BESYLATE 10 MG PO TABS: 10.0000 mg | ORAL_TABLET | Freq: Every day | ORAL | 3 refills | Status: DC

## 2020-11-23 MED ORDER — ROSUVASTATIN CALCIUM 40 MG PO TABS: 40.0000 mg | ORAL_TABLET | Freq: Every day | ORAL | 3 refills | Status: DC

## 2020-11-23 MED ORDER — EZETIMIBE 10 MG PO TABS: 10.0000 mg | ORAL_TABLET | Freq: Every day | ORAL | 3 refills | Status: DC

## 2020-11-23 NOTE — Patient Instructions (Addendum)
Medication Instructions:  Please increase  amlodipine up to 10 mg daily  If you need a refill on your cardiac medications before your next appointment, please call your pharmacy.   Lab work: No new labs needed  Testing/Procedures: No new testing needed  Follow-Up: At [facility], you and your health needs are our priority.  As part of our continuing mission to provide you with exceptional heart care, we have created designated Provider Care Teams.  These Care Teams include your primary Cardiologist (physician) and Advanced Practice Providers (APPs -  Physician Assistants and Nurse Practitioners) who all work together to provide you with the care you need, when you need it.  You will need a follow up appointment in 12 months  Providers on your designated Care Team:   Murray Hodgkins, NP Christell Faith, PA-C Marrianne Mood, PA-C Cadence McLaughlin, [place]  COVID-19 Vaccine Information can be found at: [email or web address] For questions related to vaccine distribution or appointments, please email [email or web address] or call [phone].

## 2020-12-19 ENCOUNTER — Ambulatory Visit (INDEPENDENT_AMBULATORY_CARE_PROVIDER_SITE_OTHER): Payer: Managed Care, Other (non HMO) | Admitting: Family Medicine

## 2020-12-19 ENCOUNTER — Other Ambulatory Visit: Payer: Self-pay

## 2020-12-19 ENCOUNTER — Other Ambulatory Visit: Payer: Self-pay | Admitting: Family Medicine

## 2020-12-19 ENCOUNTER — Encounter: Payer: Self-pay | Admitting: Family Medicine

## 2020-12-19 VITALS — BP 128/66 | HR 77 | Temp 97.7°F | Resp 17 | Ht 66.0 in | Wt 182.6 lb

## 2020-12-19 DIAGNOSIS — I712 Thoracic aortic aneurysm, without rupture: Secondary | ICD-10-CM

## 2020-12-19 DIAGNOSIS — I7121 Aneurysm of the ascending aorta, without rupture: Secondary | ICD-10-CM

## 2020-12-19 DIAGNOSIS — Z0000 Encounter for general adult medical examination without abnormal findings: Secondary | ICD-10-CM

## 2020-12-19 DIAGNOSIS — M8949 Other hypertrophic osteoarthropathy, multiple sites: Secondary | ICD-10-CM

## 2020-12-19 DIAGNOSIS — Z23 Encounter for immunization: Secondary | ICD-10-CM

## 2020-12-19 DIAGNOSIS — M159 Polyosteoarthritis, unspecified: Secondary | ICD-10-CM

## 2020-12-19 MED ORDER — TRAMADOL HCL 50 MG PO TABS: 50.0000 mg | ORAL_TABLET | Freq: Four times a day (QID) | ORAL | 2 refills | Status: DC | PRN

## 2020-12-19 NOTE — Progress Notes (Signed)
Subjective:    Patient ID: Adrian Zavala, male    DOB: Jul 29, 1950, 70 y.o.   MRN: [id number]  Adrian Zavala is a 70 y.o. male presenting on 12/19/2020 for Annual Exam   HPI  Here for Annual Physical and Lab fasting orders.   Physical Currently doing well. Without new concerns. Reviewed diet and lifestyle current plans   Hyperlipidemia CAD HTN Ascending aortic aneurysm - Followed by Cardiology - He is on Zetia and Crestor (dose increase Rosuvastatin) They are monitoring CAD, he has had a CT Coronary scan done 07/2020  On Amlodipine now for BP   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, hands, hips) Dr Candelaria Stagers [facility] / [facility] Rheumatology He had x-rays C/T spine, showed OA/DJD  He continues to take Tramadol up to 3 times daily regularly PRN for pain with good results, needs refill - He has chronic back pain with DJD bone spurs and has curvature of spine, worse with prolonged sitting at work, causing worse back pain, followed by Rheumatology - Taking Tylenol Arthritis 666m x 1 per dose in afternoon and evening with some relief - On Tizanidine half of 442mTID PRN - Worse if turn neck to the R has grinding in neck - In past he had some issues with excessive walking limiting him and pain - Reports chronic problem with chronic joint pain and stiffness from arthritis. 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, has had some hand and finger joint deformity due to arthritis     Health Maintenance: Had COVID19 antibody from [place] Due for Flu Shot   Colon CA Screening: Last Colonoscopy 10/25/14 (done by [facility] - Dr WoRob Bunting results with polyp found to be hyperplastic, good for 10 years. Currently asymptomatic. No known family history of colon CA.   UTD COVID19 vaccine Completed x 4 COVID vaccines. He says he has developed some neuropathy in R arm fingers, and pain in back of neck. Also had Trigeminal neuralgia ongoing after  vaccine.   [facility] 12/19/2020 06/08/2019 12/08/2018  Decreased Interest 0 0 0  Down, Depressed, Hopeless 0 0 0  PHQ - 2 Score 0 0 0  Altered sleeping 1 - -  Tired, decreased energy 0 - -  Change in appetite 0 - -  Feeling bad or failure about yourself  0 - -  Trouble concentrating 0 - -  Moving slowly or fidgety/restless 0 - -  Suicidal thoughts 0 - -  PHQ-9 Score 1 - -  Difficult doing work/chores Not difficult at all - -    Past Medical History:  Diagnosis Date   Arthritis    Spine, Left Hip, Hands   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HCC)    GERD (gastroesophageal reflux disease)    Heart murmur    Hypercholesteremia    Hypothyroidism    graves   Lung disorder    S/P chemo for Wegener's   Neuromuscular disorder (HCC)    some leg weakness S/P Chemo for Wegener's   Osteopenia    Osteoporosis    Femur   Seasonal allergies    Wegener's granulomatosis    Past Surgical History:  Procedure Laterality Date   CHOLECYSTECTOMY     has "metal sutures"    COLONOSCOPY N/A 10/25/2014   Procedure: COLONOSCOPY;  Surgeon: DaLucilla LameMD;  [place] Service: Gastro[place]ology;  Laterality: N/A;   TREATMENT FISTULA ANAL     closure with rectal advancement flap  Social History   Socioeconomic History   Marital status: Married    Spouse name: Jeydan Barner   Number of children: Not on file   Years of education: Not on file   Highest education level: Not on file  Occupational History   Occupation: Scientist, 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[place])  Tobacco Use   Smoking status: Former    Types: Cigarettes    Quit date: 08/24/1996    Years since quitting: 24.3   Smokeless tobacco: Former  Scientific laboratory technician Use: Never used  Substance and Sexual Activity   Alcohol use: Yes    Alcohol/week: 3.0 standard drinks    Types: 3 Cans of beer per week   Drug use: No   Sexual activity: Not on file  Other Topics Concern   Not on file  Social History Narrative   Not on file    Social Determinants of Health   Financial Resource Strain: Not on file  Food Insecurity: Not on file  Transportation Needs: Not on file  Physical Activity: Not on file  Stress: Not on file  Social Connections: Not on file  Intimate Partner Violence: Not on file   Family History  Family history unknown: Yes   Current Outpatient Medications on File Prior to Visit  Medication Sig   acetaminophen (TYLENOL) 650 MG CR tablet Take 350 mg by mouth every 8 (eight) hours as needed for pain or fever.    amLODipine (NORVASC) 10 MG tablet Take 1 tablet (10 mg total) by mouth daily.   aspirin 81 MG tablet Take 81 mg by mouth daily. AM   B Complex-C-Folic Acid (B-COMPLEX BALANCED PO) Take 1 tablet by mouth daily.   calcium carbonate (OS-CAL) 600 MG TABS tablet Take 600 mg by mouth daily. AM   ezetimibe (ZETIA) 10 MG tablet Take 1 tablet (10 mg total) by mouth daily.   famotidine (PEPCID) 40 MG tablet TAKE 1 TABLET BY MOUTH  DAILY   fluticasone (FLONASE) 50 MCG/ACT nasal spray Place 2 sprays into both nostrils daily. Use for 4-6 weeks then stop and use seasonally or as needed.   folic acid (FOLVITE) 852 MCG tablet Take 400 mcg by mouth daily. AM   gabapentin (NEURONTIN) 100 MG capsule Take 200 mg by mouth 3 (three) times daily.   ibandronate (BONIVA) 150 MG tablet Take 150 mg by mouth every 30 (thirty) days.   levothyroxine (SYNTHROID) 25 MCG tablet TAKE 1 TABLET BY MOUTH  DAILY   loratadine (CLARITIN) 10 MG tablet Take 10 mg by mouth daily as needed for allergies.    magnesium oxide (MAG-OX) 400 MG tablet Take 400 mg by mouth daily.   niacin (NIASPAN) 500 MG CR tablet Take 750 mg by mouth at bedtime.   predniSONE (DELTASONE) 5 MG tablet Take 5 mg by mouth daily.   rosuvastatin (CRESTOR) 40 MG tablet Take 1 tablet (40 mg total) by mouth daily.   Vitamin D, Cholecalciferol, 400 UNITS CAPS Take 400 Units by mouth daily.   tiZANidine (ZANAFLEX) 4 MG tablet Take 2 mg by mouth 3 (three) times daily  as needed. (Patient not taking: Reported on 12/19/2020)   No current facility-administered medications on file prior to visit.    Review of Systems  Constitutional:  Negative for activity change, appetite change, chills, diaphoresis, fatigue and fever.  HENT:  Negative for congestion and hearing loss.   Eyes:  Negative for visual disturbance.  Respiratory:  Negative for cough, chest tightness, shortness of breath and wheezing.  Cardiovascular:  Negative for chest pain, palpitations and leg swelling.  Gastrointestinal:  Negative for abdominal pain, constipation, diarrhea, nausea and vomiting.  Genitourinary:  Negative for dysuria, frequency and hematuria.  Musculoskeletal:  Negative for arthralgias and neck pain.  Skin:  Negative for rash.  Neurological:  Negative for dizziness, weakness, light-headedness, numbness and headaches.  Hematological:  Negative for adenopathy.  Psychiatric/Behavioral:  Negative for behavioral problems, dysphoric mood and sleep disturbance.   Per HPI unless specifically indicated above     Objective:    BP 128/66 (BP Location: Right Arm, Patient Position: Sitting, Cuff Size: Normal)   Pulse 77   Temp 97.7 F (36.5 C) (Temporal)   Resp 17   Ht 5' 6"  (1.676 m)   Wt 182 lb 9.6 oz (82.8 kg)   SpO2 100%   BMI 29.47 kg/m   Wt Readings from Last 3 Encounters:  12/19/20 182 lb 9.6 oz (82.8 kg)  11/23/20 184 lb 2 oz (83.5 kg)  09/19/20 182 lb (82.6 kg)    Physical Exam Vitals and nursing note reviewed.  Constitutional:      General: He is not in acute distress.    Appearance: He is well-developed. He is not diaphoretic.     Comments: Well-appearing, comfortable, cooperative  HENT:     Head: Normocephalic and atraumatic.  Eyes:     General:        Right eye: No discharge.        Left eye: No discharge.     Conjunctiva/sclera: Conjunctivae normal.     Pupils: Pupils are equal, round, and reactive to light.  Neck:     Thyroid: No thyromegaly.      Vascular: Carotid bruit (bilateral) present.  Cardiovascular:     Rate and Rhythm: Normal rate and regular rhythm.     Pulses: Normal pulses.     Heart sounds: Normal heart sounds. No murmur heard. Pulmonary:     Effort: Pulmonary effort is normal. No respiratory distress.     Breath sounds: Normal breath sounds. No wheezing or rales.  Abdominal:     General: Bowel sounds are normal. There is no distension.     Palpations: Abdomen is soft. There is no mass.     Tenderness: There is no abdominal tenderness.  Musculoskeletal:        General: No tenderness. Normal range of motion.     Cervical back: Normal range of motion and neck supple.     Right lower leg: No edema.     Left lower leg: No edema.     Comments: Upper / Lower Extremities: - Normal muscle tone, strength bilateral upper extremities 5/5, lower extremities 5/5  Lymphadenopathy:     Cervical: No cervical adenopathy.  Skin:    General: Skin is warm and dry.     Findings: No erythema or rash.  Neurological:     Mental Status: He is alert and oriented to person, place, and time.     Comments: Distal sensation intact to light touch all extremities  Psychiatric:        Mood and Affect: Mood normal.        Behavior: Behavior normal.        Thought Content: Thought content normal.     Comments: Well groomed, good eye contact, normal speech and thoughts      Results for orders placed or performed during the hospital encounter of 09/19/20  CBC with Differential  Result Value Ref Range   WBC 10.2  4.0 - 10.5 K/uL   RBC 4.15 (L) 4.22 - 5.81 MIL/uL   Hemoglobin 13.4 13.0 - 17.0 g/dL   HCT 39.3 39.0 - 52.0 %   MCV 94.7 80.0 - 100.0 fL   MCH 32.3 26.0 - 34.0 pg   MCHC 34.1 30.0 - 36.0 g/dL   RDW 13.2 11.5 - 15.5 %   Platelets 178 150 - 400 K/uL   nRBC 0.0 0.0 - 0.2 %   Neutrophils Relative % 74 %   Neutro Abs 7.5 1.7 - 7.7 K/uL   Lymphocytes Relative 15 %   Lymphs Abs 1.5 0.7 - 4.0 K/uL   Monocytes Relative 7 %    Monocytes Absolute 0.7 0.1 - 1.0 K/uL   Eosinophils Relative 2 %   Eosinophils Absolute 0.2 0.0 - 0.5 K/uL   Basophils Relative 1 %   Basophils Absolute 0.1 0.0 - 0.1 K/uL   Immature Granulocytes 1 %   Abs Immature Granulocytes 0.11 (H) 0.00 - 0.07 K/uL  Basic metabolic panel  Result Value Ref Range   Sodium 137 135 - 145 mmol/L   Potassium 3.7 3.5 - 5.1 mmol/L   Chloride 108 98 - 111 mmol/L   CO2 22 22 - 32 mmol/L   Glucose, Bld 127 (H) 70 - 99 mg/dL   BUN 28 (H) 8 - 23 mg/dL   Creatinine, Ser 1.09 0.61 - 1.24 mg/dL   Calcium 9.8 8.9 - 10.3 mg/dL   GFR, Estimated >60 >60 mL/min   Anion gap 7 5 - 15      Assessment & Plan:   Problem List Items Addressed This Visit     Ascending aortic aneurysm ([place])    Followed by Cardiology No new concerns.      Other Visit Diagnoses     Annual physical exam    -  Primary   Relevant Orders   CBC with Differential/Platelet   Lipid panel   Hemoglobin A1c   PSA   TSH   T4, free   Comprehensive metabolic panel   Needs flu shot       Relevant Orders   Flu Vaccine QUAD High Dose(Fluad)       Updated Health Maintenance information Flu Shot today Declines COVID vaccine Pending fasting labs [facility]. Encouraged improvement to lifestyle with diet and exercise Goal of weight loss  Followed by Cardiology for CAD last CT Coronary 07/2020  Reviewed prior imaging CT Coronary and prior CT scans for monitoring Aortic Aneurysm Followed by Cardiology, on Statin lipid therapy.   No orders of the defined types were placed in this encounter.    Follow up plan: Return in about 6 months (around 06/18/2021) for 6 month follow-up Back pain/arthritis med refill.  [name], DO [facility] 12/19/2020, 8:11 AM

## 2020-12-19 NOTE — Assessment & Plan Note (Signed)
Followed by Cardiology No new concerns.

## 2020-12-19 NOTE — Patient Instructions (Addendum)
Thank you for coming to the office today.  Printed [facility] orders  High dose Flu Shot today  Refilled Tramadol use PRN.  DUE for FASTING BLOOD WORK (no food or drink after midnight before the lab appointment, only water or coffee without cream/sugar on the morning of)  [facility] orders today  For Lab Results, once available within 2-3 days of blood draw, you can can log in to MyChart online to view your results and a brief explanation. Also, we can discuss results at next follow-up visit.   Please schedule a Follow-up Appointment to: Return in about 6 months (around 06/18/2021) for 6 month follow-up Back pain/arthritis med refill.  If you have any other questions or concerns, please feel free to call the office or send a message through [place]. You may also schedule an earlier appointment if necessary.  Additionally, you may be receiving a survey about your experience at our office within a few days to 1 week by e-mail or mail. We value your feedback.  [name], DO [place]

## 2020-12-20 LAB — LIPID PANEL
Chol/HDL Ratio: 1.9 ratio (ref 0.0–5.0)
Cholesterol, Total: 142 mg/dL (ref 100–199)
HDL: 73 mg/dL (ref >39)
LDL Chol Calc (NIH): 50 mg/dL (ref 0–99)
Triglycerides: 109 mg/dL (ref 0–149)
VLDL Cholesterol Cal: 19 mg/dL (ref 5–40)

## 2020-12-20 LAB — CBC WITH DIFFERENTIAL/PLATELET
Basophils Absolute: 0.1 10*3/uL (ref 0.0–0.2)
Basos: 1 %
EOS (ABSOLUTE): 0.1 10*3/uL (ref 0.0–0.4)
Eos: 1 %
Hematocrit: 44.7 % (ref 37.5–51.0)
Hemoglobin: 14.7 g/dL (ref 13.0–17.7)
Immature Grans (Abs): 0.1 10*3/uL (ref 0.0–0.1)
Immature Granulocytes: 1 %
Lymphocytes Absolute: 1.6 10*3/uL (ref 0.7–3.1)
Lymphs: 16 %
MCH: 31.3 pg (ref 26.6–33.0)
MCHC: 32.9 g/dL (ref 31.5–35.7)
MCV: 95 fL (ref 79–97)
Monocytes Absolute: 0.9 10*3/uL (ref 0.1–0.9)
Monocytes: 9 %
Neutrophils Absolute: 7.4 10*3/uL — ABNORMAL HIGH (ref 1.4–7.0)
Neutrophils: 72 %
Platelets: 175 10*3/uL (ref 150–450)
RBC: 4.7 x10E6/uL (ref 4.14–5.80)
RDW: 12.5 % (ref 11.6–15.4)
WBC: 10.1 10*3/uL (ref 3.4–10.8)

## 2020-12-20 LAB — COMPREHENSIVE METABOLIC PANEL
ALT: 33 IU/L (ref 0–44)
AST: 24 IU/L (ref 0–40)
Albumin/Globulin Ratio: 2.5 — ABNORMAL HIGH (ref 1.2–2.2)
Albumin: 4.5 g/dL (ref 3.8–4.8)
Alkaline Phosphatase: 66 IU/L (ref 44–121)
BUN/Creatinine Ratio: 23 (ref 10–24)
BUN: 24 mg/dL (ref 8–27)
Bilirubin Total: 0.6 mg/dL (ref 0.0–1.2)
CO2: 24 mmol/L (ref 20–29)
Calcium: 10.2 mg/dL (ref 8.6–10.2)
Chloride: 105 mmol/L (ref 96–106)
Creatinine, Ser: 1.03 mg/dL (ref 0.76–1.27)
Globulin, Total: 1.8 g/dL (ref 1.5–4.5)
Glucose: 76 mg/dL (ref 70–99)
Potassium: 4.3 mmol/L (ref 3.5–5.2)
Sodium: 143 mmol/L (ref 134–144)
Total Protein: 6.3 g/dL (ref 6.0–8.5)
eGFR: 79 mL/min/{1.73_m2} (ref >59)

## 2020-12-20 LAB — HEMOGLOBIN A1C
Est. average glucose Bld gHb Est-mCnc: 126 mg/dL
Hgb A1c MFr Bld: 6 % — ABNORMAL HIGH (ref 4.8–5.6)

## 2020-12-20 LAB — TSH: TSH: 1.22 u[IU]/mL (ref 0.450–4.500)

## 2020-12-20 LAB — T4, FREE: Free T4: 1.12 ng/dL (ref 0.82–1.77)

## 2020-12-20 LAB — PSA: Prostate Specific Ag, Serum: 0.4 ng/mL (ref 0.0–4.0)

## 2021-01-06 ENCOUNTER — Other Ambulatory Visit: Payer: Self-pay | Admitting: Cardiovascular Disease

## 2021-01-06 ENCOUNTER — Other Ambulatory Visit: Payer: Self-pay | Admitting: Family Medicine

## 2021-01-06 DIAGNOSIS — E039 Hypothyroidism, unspecified: Secondary | ICD-10-CM

## 2021-01-07 NOTE — Telephone Encounter (Signed)
Requested Prescriptions  Pending Prescriptions Disp Refills  . levothyroxine (SYNTHROID) 25 MCG tablet [Pharmacy Med Name: Levothyroxine Sodium 25 MCG Oral Tablet] 90 tablet 3    Sig: TAKE 1 TABLET BY MOUTH  DAILY     Endocrinology:  Hypothyroid Agents Failed - 01/06/2021  8:52 PM      Failed - TSH needs to be rechecked within 3 months after an abnormal result. Refill until TSH is due.      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

## 2021-01-09 ENCOUNTER — Other Ambulatory Visit: Payer: Self-pay

## 2021-01-09 MED ORDER — ROSUVASTATIN CALCIUM 40 MG PO TABS: 40.0000 mg | ORAL_TABLET | Freq: Every day | ORAL | 2 refills | Status: DC

## 2021-02-22 ENCOUNTER — Other Ambulatory Visit: Payer: Self-pay | Admitting: Physical Medicine & Rehabilitation

## 2021-02-22 DIAGNOSIS — G8929 Other chronic pain: Secondary | ICD-10-CM

## 2021-02-22 DIAGNOSIS — M5414 Radiculopathy, thoracic region: Secondary | ICD-10-CM

## 2021-03-01 ENCOUNTER — Ambulatory Visit: Payer: Managed Care, Other (non HMO)

## 2021-03-08 ENCOUNTER — Ambulatory Visit
Admission: RE | Admit: 2021-03-08 | Discharge: 2021-03-08 | Disposition: A | Discharge status: Home / Self Care | Payer: Managed Care, Other (non HMO) | Source: Ambulatory Visit | Attending: Physical Medicine & Rehabilitation | Admitting: Physical Medicine & Rehabilitation

## 2021-03-08 ENCOUNTER — Other Ambulatory Visit: Payer: Self-pay

## 2021-03-08 DIAGNOSIS — M5414 Radiculopathy, thoracic region: Secondary | ICD-10-CM | POA: Diagnosis present

## 2021-03-08 DIAGNOSIS — M546 Pain in thoracic spine: Secondary | ICD-10-CM | POA: Diagnosis present

## 2021-03-08 DIAGNOSIS — G8929 Other chronic pain: Secondary | ICD-10-CM | POA: Insufficient documentation

## 2021-04-28 ENCOUNTER — Other Ambulatory Visit: Payer: Self-pay | Admitting: Family Medicine

## 2021-04-28 DIAGNOSIS — M159 Polyosteoarthritis, unspecified: Secondary | ICD-10-CM

## 2021-04-28 NOTE — Telephone Encounter (Signed)
Requested medication (s) are due for refill today:   Provider to review  Requested medication (s) are on the active medication list:   Yes  Future visit scheduled:   Yes   Last ordered: 12/19/2020 #120, 2 refills  Returned because it's a non delegated refill   Requested Prescriptions  Pending Prescriptions Disp Refills   traMADol (ULTRAM) 50 MG tablet [Pharmacy Med Name: TRAMADOL 50MG TABLETS] 120 tablet     Sig: TAKE 1 TABLET(50 MG) BY MOUTH EVERY 6 HOURS AS NEEDED FOR MODERATE PAIN OR SEVERE PAIN     Not Delegated - Analgesics:  Opioid Agonists Failed - 04/28/2021  8:23 AM      Failed - This refill cannot be delegated      Failed - Urine Drug Screen completed in last 360 days      Failed - Valid encounter within last 3 months    Recent Outpatient Visits           4 months ago Annual physical exam   [place], DO   10 months ago Primary osteoarthritis involving multiple joints   [place], DO   1 year ago Annual physical exam   [place], DO   1 year ago 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

## 2021-06-19 ENCOUNTER — Ambulatory Visit: Payer: Managed Care, Other (non HMO) | Admitting: Family Medicine

## 2021-06-28 ENCOUNTER — Other Ambulatory Visit: Payer: Self-pay | Admitting: Family Medicine

## 2021-06-28 ENCOUNTER — Ambulatory Visit: Payer: Managed Care, Other (non HMO) | Admitting: Family Medicine

## 2021-06-28 ENCOUNTER — Encounter: Payer: Self-pay | Admitting: Family Medicine

## 2021-06-28 VITALS — BP 136/68 | HR 74 | Ht 66.0 in | Wt 189.6 lb

## 2021-06-28 DIAGNOSIS — E039 Hypothyroidism, unspecified: Secondary | ICD-10-CM

## 2021-06-28 DIAGNOSIS — M150 Primary generalized (osteo)arthritis: Secondary | ICD-10-CM

## 2021-06-28 DIAGNOSIS — Z0000 Encounter for general adult medical examination without abnormal findings: Secondary | ICD-10-CM

## 2021-06-28 DIAGNOSIS — I10 Essential (primary) hypertension: Secondary | ICD-10-CM | POA: Diagnosis not present

## 2021-06-28 DIAGNOSIS — J321 Chronic frontal sinusitis: Secondary | ICD-10-CM | POA: Diagnosis not present

## 2021-06-28 DIAGNOSIS — M159 Polyosteoarthritis, unspecified: Secondary | ICD-10-CM | POA: Diagnosis not present

## 2021-06-28 DIAGNOSIS — R7309 Other abnormal glucose: Secondary | ICD-10-CM

## 2021-06-28 DIAGNOSIS — N401 Enlarged prostate with lower urinary tract symptoms: Secondary | ICD-10-CM

## 2021-06-28 DIAGNOSIS — E782 Mixed hyperlipidemia: Secondary | ICD-10-CM

## 2021-06-28 MED ORDER — IPRATROPIUM BROMIDE 0.06 % NA SOLN: 2.0000 | Freq: Four times a day (QID) | NASAL | 2 refills | Status: DC | PRN

## 2021-06-28 MED ORDER — AZITHROMYCIN 250 MG PO TABS: ORAL_TABLET | ORAL | 0 refills | Status: DC

## 2021-06-28 NOTE — Progress Notes (Signed)
? ?Subjective:  ? ? Patient ID: Adrian Zavala, male    DOB: [birth year], 71 y.o.   MRN: [id number] ? ?Adrian Zavala is a 71 y.o. male presenting on 06/28/2021 for Back Pain ? ? ?HPI ? ?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, hands, hips) ?Dr Candelaria Stagers [facility] ?He had x-rays C/T spine, showed OA/DJD ?  ?He continues to take Tramadol up to 3 times daily regularly PRN for pain with good results, needs refill ?- He has chronic back pain with DJD bone spurs and has curvature of spine, worse with prolonged sitting at work, causing worse back pain, followed by Rheumatology ?- Taking Tylenol Arthritis 672m x 1 per dose in afternoon and evening with some relief ?- On Tizanidine half of 468mTID PRN ?- Worse if turn neck to the R has grinding in neck ?- In past he had some issues with excessive walking limiting him and pain ?- Reports chronic problem with chronic joint pain and stiffness from arthritis. 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 ?Currently improved back pain, seems to have some improved movement with stretching and limiting flares. ? ?Sinusitis, recurrent ?Reports persistent or chronic vs perpetual sinusitis ?He describes continues to work on draining his sinuses ?Admits episodic fissure in R nostril - uses neosporin topical on Q-tip ? ?Cataract surgery 07/2020 ?He had some cloudiness over lens, then now has had laser to remove cloudiness on the lens. He legs/clamps on the lenses was lasered off. ? ? ? ?  06/28/2021  ?  8:32 AM 12/19/2020  ?  8:17 AM 06/08/2019  ?  9:25 AM  ?Depression screen PHQ 2/9  ?Decreased Interest 0 0 0  ?Down, Depressed, Hopeless 0 0 0  ?PHQ - 2 Score 0 0 0  ?Altered sleeping 0 1   ?Tired, decreased energy 0 0   ?Change in appetite 0 0   ?Feeling bad or failure about yourself  0 0   ?Trouble concentrating 0 0   ?Moving slowly or fidgety/restless 0 0   ?Suicidal thoughts 0  0   ?PHQ-9 Score 0 1   ?Difficult doing work/chores Not difficult at all Not difficult at all   ? ? ?Social History  ? ?Tobacco Use  ? Smoking status: Former  ?  Types: Cigarettes  ?  Quit date: 08/24/1996  ?  Years since quitting: 24.8  ? Smokeless tobacco: Former  ?Vaping Use  ? Vaping Use: Never used  ?Substance Use Topics  ? Alcohol use: Yes  ?  Alcohol/week: 3.0 standard drinks  ?  Types: 3 Cans of beer per week  ? Drug use: No  ? ? ?Review of Systems ?Per HPI unless specifically indicated above ? ?   ?Objective:  ?  ?BP 136/68   Pulse 74   Ht 5' 6"  (1.676 m)   Wt 189 lb 9.6 oz (86 kg)   SpO2 99%   BMI 30.60 kg/m?   ?Wt Readings from Last 3 Encounters:  ?06/28/21 189 lb 9.6 oz (86 kg)  ?12/19/20 182 lb 9.6 oz (82.8 kg)  ?11/23/20 184 lb 2 oz (83.5 kg)  ?  ?Physical Exam ?Vitals and nursing note reviewed.  ?Constitutional:   ?   General: He is not in acute distress. ?   Appearance: Normal appearance. He is well-developed. He is not diaphoretic.  ?   Comments: Well-appearing, comfortable, cooperative  ?HENT:  ?  Head: Normocephalic and atraumatic.  ?Eyes:  ?   General:     ?   Right eye: No discharge.     ?   Left eye: No discharge.  ?   Conjunctiva/sclera: Conjunctivae normal.  ?Cardiovascular:  ?   Rate and Rhythm: Normal rate.  ?Pulmonary:  ?   Effort: Pulmonary effort is normal.  ?Skin: ?   General: Skin is warm and dry.  ?   Findings: No erythema or rash.  ?Neurological:  ?   Mental Status: He is alert and oriented to person, place, and time.  ?Psychiatric:     ?   Mood and Affect: Mood normal.     ?   Behavior: Behavior normal.     ?   Thought Content: Thought content normal.  ?   Comments: Well groomed, good eye contact, normal speech and thoughts  ? ?Results for orders placed or performed in visit on 12/19/20  ?CBC with Differential/Platelet  ?Result Value Ref Range  ? WBC 10.1 3.4 - 10.8 x10E3/uL  ? RBC 4.70 4.14 - 5.80 x10E6/uL  ? Hemoglobin 14.7 13.0 - 17.7 g/dL  ? Hematocrit 44.7 37.5 - 51.0 %  ?  MCV 95 79 - 97 fL  ? MCH 31.3 26.6 - 33.0 pg  ? MCHC 32.9 31.5 - 35.7 g/dL  ? RDW 12.5 11.6 - 15.4 %  ? Platelets 175 150 - 450 x10E3/uL  ? Neutrophils 72 Not Estab. %  ? Lymphs 16 Not Estab. %  ? Monocytes 9 Not Estab. %  ? Eos 1 Not Estab. %  ? Basos 1 Not Estab. %  ? Neutrophils Absolute 7.4 (H) 1.4 - 7.0 x10E3/uL  ? Lymphocytes Absolute 1.6 0.7 - 3.1 x10E3/uL  ? Monocytes Absolute 0.9 0.1 - 0.9 x10E3/uL  ? EOS (ABSOLUTE) 0.1 0.0 - 0.4 x10E3/uL  ? Basophils Absolute 0.1 0.0 - 0.2 x10E3/uL  ? Immature Granulocytes 1 Not Estab. %  ? Immature Grans (Abs) 0.1 0.0 - 0.1 x10E3/uL  ?Lipid panel  ?Result Value Ref Range  ? Cholesterol, Total 142 100 - 199 mg/dL  ? Triglycerides 109 0 - 149 mg/dL  ? HDL 73 >39 mg/dL  ? VLDL Cholesterol Cal 19 5 - 40 mg/dL  ? LDL Chol Calc (NIH) 50 0 - 99 mg/dL  ? Chol/HDL Ratio 1.9 0.0 - 5.0 ratio  ?Hemoglobin A1c  ?Result Value Ref Range  ? Hgb A1c MFr Bld 6.0 (H) 4.8 - 5.6 %  ? Est. average glucose Bld gHb Est-mCnc 126 mg/dL  ?PSA  ?Result Value Ref Range  ? Prostate Specific Ag, Serum 0.4 0.0 - 4.0 ng/mL  ?TSH  ?Result Value Ref Range  ? TSH 1.220 0.450 - 4.500 uIU/mL  ?T4, free  ?Result Value Ref Range  ? Free T4 1.12 0.82 - 1.77 ng/dL  ?Comprehensive metabolic panel  ?Result Value Ref Range  ? Glucose 76 70 - 99 mg/dL  ? BUN 24 8 - 27 mg/dL  ? Creatinine, Ser 1.03 0.76 - 1.27 mg/dL  ? eGFR 79 >59 mL/min/1.73  ? BUN/Creatinine Ratio 23 10 - 24  ? Sodium 143 134 - 144 mmol/L  ? Potassium 4.3 3.5 - 5.2 mmol/L  ? Chloride 105 96 - 106 mmol/L  ? CO2 24 20 - 29 mmol/L  ? Calcium 10.2 8.6 - 10.2 mg/dL  ? Total Protein 6.3 6.0 - 8.5 g/dL  ? Albumin 4.5 3.8 - 4.8 g/dL  ? Globulin, Total 1.8 1.5 - 4.5 g/dL  ?  Albumin/Globulin Ratio 2.5 (H) 1.2 - 2.2  ? Bilirubin Total 0.6 0.0 - 1.2 mg/dL  ? Alkaline Phosphatase 66 44 - 121 IU/L  ? AST 24 0 - 40 IU/L  ? ALT 33 0 - 44 IU/L  ? ?   ?Assessment & Plan:  ? ?Problem List Items Addressed This Visit   ? ? Osteoarthritis of multiple joints  ?  Hypothyroidism - Primary  ? Essential hypertension  ? ?Other Visit Diagnoses   ? ? Chronic frontal sinusitis      ? Relevant Medications  ? azithromycin (ZITHROMAX Z-PAK) 250 MG tablet  ? ipratropium (ATROVENT) 0.06 % nasal spray  ? ?  ?  ?Hypothyroidism ?Controlled on current therapy Levothyroxine 73mg daily ?Future labs ordered ? ?HTN ?Controlled on current regimen ? ?Osteoarthritis, spine multiple joints ?Chronic stable problem ?Continue on therapy with Orthopedics / Rheumatology ?Prior imaging ?Continue gabapentin, Tramadol, Tizanidine ? ?Sinusitis ?Episodic flares, with sinus congestion flares. He has nasal fissure episodic ?Trial on Zpak course and Atrovent PRN ? ?Meds ordered this encounter  ?Medications  ? azithromycin (ZITHROMAX Z-PAK) 250 MG tablet  ?  Sig: Take 2 tabs (5068mtotal) on Day 1. Take 1 tab (25031mdaily for next 4 days.  ?  Dispense:  6 tablet  ?  Refill:  0  ? ipratropium (ATROVENT) 0.06 % nasal spray  ?  Sig: Place 2 sprays into both nostrils 4 (four) times daily as needed for rhinitis.  ?  Dispense:  15 mL  ?  Refill:  2  ? ? ? ? ?Follow up plan: ?Return in about 6 months (around 12/28/2021) for 6 month fasting lab only then 1 week later Annual Physical. ? ?Future labs ordered for 12/2021 ? ?AleNobie PutnamO ?[facility] ?06/28/2021, 8:50 AM ?

## 2021-06-28 NOTE — Patient Instructions (Addendum)
Thank you for coming to the office today. ? ?For sinuses ? ?Start Azithromycin Z pak (antibiotic) 2 tabs day 1, then 1 tab x 4 days, complete entire course even if improved ? ?Start Atrovent nasal spray decongestant 2 sprays in each nostril up to 4 times daily for 7 days ? ?DUE for FASTING BLOOD WORK (no food or drink after midnight before the lab appointment, only water or coffee without cream/sugar on the morning of) ? ?SCHEDULE "Lab Only" visit in the morning at the clinic for lab draw in 6 MONTHS  ? ?- Make sure Lab Only appointment is at about 1 week before your next appointment, so that results will be available ? ?For Lab Results, once available within 2-3 days of blood draw, you can can log in to MyChart online to view your results and a brief explanation. Also, we can discuss results at next follow-up visit. ? ? ?Please schedule a Follow-up Appointment to: Return in about 6 months (around 12/28/2021) for 6 month fasting lab only then 1 week later Annual Physical. ? ?If you have any other questions or concerns, please feel free to call the office or send a message through [place]. You may also schedule an earlier appointment if necessary. ? ?Additionally, you may be receiving a survey about your experience at our office within a few days to 1 week by e-mail or mail. We value your feedback. ? ?[name], DO ?[place] ?

## 2021-08-02 ENCOUNTER — Other Ambulatory Visit: Payer: Self-pay | Admitting: Physical Medicine & Rehabilitation

## 2021-08-02 DIAGNOSIS — G8929 Other chronic pain: Secondary | ICD-10-CM

## 2021-08-11 ENCOUNTER — Ambulatory Visit
Admission: RE | Admit: 2021-08-11 | Discharge: 2021-08-11 | Disposition: A | Discharge status: Home / Self Care | Payer: Managed Care, Other (non HMO) | Source: Ambulatory Visit | Attending: Physical Medicine & Rehabilitation | Admitting: Physical Medicine & Rehabilitation

## 2021-08-11 DIAGNOSIS — M5441 Lumbago with sciatica, right side: Secondary | ICD-10-CM | POA: Diagnosis present

## 2021-08-11 DIAGNOSIS — G8929 Other chronic pain: Secondary | ICD-10-CM

## 2021-08-15 ENCOUNTER — Telehealth: Payer: Self-pay

## 2021-08-15 NOTE — Telephone Encounter (Signed)
Copied from [place] [phone]. Topic: General - Other >> Aug 15, 2021  8:47 AM [name] A wrote: Reason for CRM: The patient is currently out of work on [employer] leave   [place] group directed the patient to notify their PCP   The patient has FMLA paperwork and they would like to know when the best time to drop it off may be   The patient is scheduled to return to work tentatively on 08/24/21  The patient is also being seen by Dr. Alba Destine for their right hip discomfort   Please contact further if needed   ** I spoke with the patient and Dr. Glendell Docker CMA.   He is aware that she has to complete the FMLA and release him to work.  I have let Dr. Raliegh Ip know what was going on and the patient will be seen by them for this on May 30th.  No further actions needed from our end.

## 2021-08-28 ENCOUNTER — Other Ambulatory Visit: Payer: Self-pay | Admitting: Family Medicine

## 2021-08-28 DIAGNOSIS — M159 Polyosteoarthritis, unspecified: Secondary | ICD-10-CM

## 2021-08-28 NOTE — Addendum Note (Signed)
Encounter addended by: Annie Paras on: 08/28/2021 1:53 PM  Actions taken: Letter saved

## 2021-08-29 ENCOUNTER — Encounter: Payer: Self-pay | Admitting: Family Medicine

## 2021-08-29 NOTE — Telephone Encounter (Signed)
Requested medication (s) are due for refill today: yes  Requested medication (s) are on the active medication list: yes  Last refill:  04/28/21 #120 2 RF  Future visit scheduled: no  Notes to clinic:  med not delegated to NT to RF   Requested Prescriptions  Pending Prescriptions Disp Refills   traMADol (ULTRAM) 50 MG tablet [Pharmacy Med Name: TRAMADOL 50MG TABLETS] 120 tablet     Sig: TAKE 1 TABLET(50 MG) BY MOUTH EVERY 6 HOURS AS NEEDED FOR MODERATE PAIN     Not Delegated - Analgesics:  Opioid Agonists Failed - 08/28/2021  1:06 PM      Failed - This refill cannot be delegated      Failed - Urine Drug Screen completed in last 360 days      Passed - Valid encounter within last 3 months    Recent Outpatient Visits           2 months ago Acquired hypothyroidism   [place], DO   8 months ago Annual physical exam   [place], DO   1 year ago Primary osteoarthritis involving multiple joints   [place], DO   1 year ago Annual physical exam   [place], DO   2 years ago 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

## 2021-09-07 ENCOUNTER — Other Ambulatory Visit: Payer: Self-pay | Admitting: Sports Medicine

## 2021-09-07 DIAGNOSIS — W19XXXA Unspecified fall, initial encounter: Secondary | ICD-10-CM

## 2021-09-07 DIAGNOSIS — G8929 Other chronic pain: Secondary | ICD-10-CM

## 2021-09-08 ENCOUNTER — Other Ambulatory Visit: Payer: Self-pay | Admitting: Family Medicine

## 2021-09-08 DIAGNOSIS — K219 Gastro-esophageal reflux disease without esophagitis: Secondary | ICD-10-CM

## 2021-09-08 NOTE — Telephone Encounter (Signed)
Requested Prescriptions  Pending Prescriptions Disp Refills  . 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

## 2021-09-09 ENCOUNTER — Other Ambulatory Visit: Payer: Self-pay | Admitting: Family Medicine

## 2021-09-09 DIAGNOSIS — K219 Gastro-esophageal reflux disease without esophagitis: Secondary | ICD-10-CM

## 2021-09-11 NOTE — Telephone Encounter (Signed)
Duplicate request. Requested Prescriptions  Pending Prescriptions Disp Refills  . 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

## 2021-09-14 ENCOUNTER — Ambulatory Visit
Admission: RE | Admit: 2021-09-14 | Discharge: 2021-09-14 | Disposition: A | Discharge status: Home / Self Care | Payer: Managed Care, Other (non HMO) | Source: Ambulatory Visit | Attending: Sports Medicine | Admitting: Sports Medicine

## 2021-09-14 DIAGNOSIS — M25551 Pain in right hip: Secondary | ICD-10-CM | POA: Insufficient documentation

## 2021-09-14 DIAGNOSIS — X58XXXA Exposure to other specified factors, initial encounter: Secondary | ICD-10-CM | POA: Diagnosis not present

## 2021-09-14 DIAGNOSIS — G8929 Other chronic pain: Secondary | ICD-10-CM | POA: Diagnosis present

## 2021-09-14 DIAGNOSIS — M1611 Unilateral primary osteoarthritis, right hip: Secondary | ICD-10-CM | POA: Diagnosis not present

## 2021-09-14 DIAGNOSIS — W19XXXA Unspecified fall, initial encounter: Secondary | ICD-10-CM | POA: Diagnosis not present

## 2021-09-14 DIAGNOSIS — M659 Synovitis and tenosynovitis, unspecified: Secondary | ICD-10-CM | POA: Insufficient documentation

## 2021-09-19 ENCOUNTER — Encounter: Payer: Self-pay | Admitting: Family Medicine

## 2021-10-17 ENCOUNTER — Other Ambulatory Visit: Payer: Self-pay | Admitting: Surgery

## 2021-10-18 NOTE — TOC Initial Note (Signed)
Transition of Care [facility]) - Initial/Assessment Note    Patient Details  Name: Adrian Zavala MRN: [id number] Date of Birth: 01-Sep-1950  Transition of Care [facility]) CM/SW Contact:    Conception Oms, RN Phone Number: 10/18/2021, 3:51 PM  Clinical Narrative:                  Requested a 3 in 1 to be delivered to the patient's home by [place]  prior to the surgery  scheduled for 10/24/21,        Patient Goals and CMS Choice        Expected Discharge Plan and Services                                                Prior Living Arrangements/Services                       Activities of Daily Living      Permission Sought/Granted                  Emotional Assessment              Admission diagnosis:  PRIMARY OSTEOARTHRITIS OF RIGHT HIP. Patient Active Problem List   Diagnosis Date Noted   Bursitis of left hip 04/28/2018   Elevated hemoglobin A1c 10/24/2017   Osteoporosis 10/01/2016   Irritable bowel syndrome (IBS) 10/01/2016   Ascending aortic aneurysm ([place]) 10/01/2016   Osteoarthritis of multiple joints 04/16/2016   BPH (benign prostatic hyperplasia) 06/06/2015   Muscle pain 06/06/2015   Fatty liver disease, nonalcoholic 92/01/9416   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[place]) 05/09/2015   Colitis 01/03/2015   HLD (hyperlipidemia) 01/03/2015   Essential hypertension 01/03/2015   Eye inflamed 01/03/2015   Hypothyroidism 01/03/2015   Bicuspid aortic valve 01/03/2015   Special screening for malignant neoplasms, colon    Benign neoplasm of sigmoid colon    First degree hemorrhoids    Arthritis, degenerative 06/03/2013   PCP:  Olin Hauser, DO Pharmacy:   [facility] [place], [place] [address] [place] [address] Phone: [phone] Fax: [phone]  [facility] ([place], [place] [facility] [address]  [place] [address] [place] [address] Phone: [phone] Fax: [phone]  [facility] ([facility] ) - [place], [place] [address] Phone: [phone] Fax: [phone]     Social Determinants of Health (SDOH) Interventions    Readmission Risk Interventions     No data to display

## 2021-10-19 ENCOUNTER — Ambulatory Visit: Payer: Managed Care, Other (non HMO) | Admitting: Nurse Practitioner

## 2021-10-19 ENCOUNTER — Telehealth: Payer: Self-pay | Admitting: *Deleted

## 2021-10-19 ENCOUNTER — Encounter
Admission: RE | Admit: 2021-10-19 | Discharge: 2021-10-19 | Disposition: A | Discharge status: Home / Self Care | Payer: Managed Care, Other (non HMO) | Source: Ambulatory Visit | Attending: Surgery | Admitting: Surgery

## 2021-10-19 VITALS — BP 136/78 | HR 99 | Resp 16 | Ht 66.0 in | Wt 193.6 lb

## 2021-10-19 DIAGNOSIS — Z01810 Encounter for preprocedural cardiovascular examination: Secondary | ICD-10-CM

## 2021-10-19 DIAGNOSIS — Z01818 Encounter for other preprocedural examination: Secondary | ICD-10-CM | POA: Diagnosis present

## 2021-10-19 HISTORY — DX: Essential (primary) hypertension: I10

## 2021-10-19 HISTORY — DX: Wegener's granulomatosis without renal involvement: M31.30

## 2021-10-19 HISTORY — DX: Atherosclerotic heart disease of native coronary artery without angina pectoris: I25.10

## 2021-10-19 HISTORY — DX: Benign prostatic hyperplasia without lower urinary tract symptoms: N40.0

## 2021-10-19 HISTORY — DX: Unspecified hemorrhoids: K64.9

## 2021-10-19 HISTORY — DX: Prediabetes: R73.03

## 2021-10-19 HISTORY — DX: Anemia, unspecified: D64.9

## 2021-10-19 HISTORY — DX: Fatty (change of) liver, not elsewhere classified: K76.0

## 2021-10-19 HISTORY — DX: Irritable bowel syndrome, unspecified: K58.9

## 2021-10-19 HISTORY — DX: Other abnormal glucose: R73.09

## 2021-10-19 LAB — URINALYSIS, ROUTINE W REFLEX MICROSCOPIC
Bilirubin Urine: NEGATIVE
Glucose, UA: NEGATIVE mg/dL
Hgb urine dipstick: NEGATIVE
Ketones, ur: NEGATIVE mg/dL
Leukocytes,Ua: NEGATIVE
Nitrite: NEGATIVE
Protein, ur: NEGATIVE mg/dL
Specific Gravity, Urine: 1.019 (ref 1.005–1.030)
pH: 5 (ref 5.0–8.0)

## 2021-10-19 LAB — CBC WITH DIFFERENTIAL/PLATELET
Abs Immature Granulocytes: 0.2 10*3/uL — ABNORMAL HIGH (ref 0.00–0.07)
Basophils Absolute: 0.2 10*3/uL — ABNORMAL HIGH (ref 0.0–0.1)
Basophils Relative: 1 %
Eosinophils Absolute: 0.3 10*3/uL (ref 0.0–0.5)
Eosinophils Relative: 2 %
HCT: 42.7 % (ref 39.0–52.0)
Hemoglobin: 14.3 g/dL (ref 13.0–17.0)
Immature Granulocytes: 2 %
Lymphocytes Relative: 12 %
Lymphs Abs: 1.6 10*3/uL (ref 0.7–4.0)
MCH: 31.6 pg (ref 26.0–34.0)
MCHC: 33.5 g/dL (ref 30.0–36.0)
MCV: 94.3 fL (ref 80.0–100.0)
Monocytes Absolute: 1.2 10*3/uL — ABNORMAL HIGH (ref 0.1–1.0)
Monocytes Relative: 9 %
Neutro Abs: 9.8 10*3/uL — ABNORMAL HIGH (ref 1.7–7.7)
Neutrophils Relative %: 74 %
Platelets: 196 10*3/uL (ref 150–400)
RBC: 4.53 MIL/uL (ref 4.22–5.81)
RDW: 13.2 % (ref 11.5–15.5)
WBC: 13.2 10*3/uL — ABNORMAL HIGH (ref 4.0–10.5)
nRBC: 0 % (ref 0.0–0.2)

## 2021-10-19 LAB — SURGICAL PCR SCREEN
MRSA, PCR: NEGATIVE
Staphylococcus aureus: NEGATIVE

## 2021-10-19 LAB — COMPREHENSIVE METABOLIC PANEL
ALT: 37 U/L (ref 0–44)
AST: 31 U/L (ref 15–41)
Albumin: 4.4 g/dL (ref 3.5–5.0)
Alkaline Phosphatase: 64 U/L (ref 38–126)
Anion gap: 9 (ref 5–15)
BUN: 24 mg/dL — ABNORMAL HIGH (ref 8–23)
CO2: 25 mmol/L (ref 22–32)
Calcium: 10.3 mg/dL (ref 8.9–10.3)
Chloride: 108 mmol/L (ref 98–111)
Creatinine, Ser: 1.12 mg/dL (ref 0.61–1.24)
GFR, Estimated: >60 mL/min (ref >60)
Glucose, Bld: 100 mg/dL — ABNORMAL HIGH (ref 70–99)
Potassium: 3.8 mmol/L (ref 3.5–5.1)
Sodium: 142 mmol/L (ref 135–145)
Total Bilirubin: 0.7 mg/dL (ref 0.3–1.2)
Total Protein: 6.9 g/dL (ref 6.5–8.1)

## 2021-10-19 LAB — TYPE AND SCREEN
ABO/RH(D): O POS
Antibody Screen: NEGATIVE

## 2021-10-19 NOTE — Patient Instructions (Addendum)
Your procedure is scheduled on:10-24-21 Tuesday Report to the Registration Desk on the 1st floor of the [facility].Then proceed to the 2nd floor Surgery Desk To find out your arrival time, please call [phone] between 1PM - 3PM on: 10-23-21 Monday  If your arrival time is 6:00 am, do not arrive prior to that time as the [facility] entrance doors do not open until 6:00 am.  REMEMBER: Instructions that are not followed completely may result in serious medical risk, up to and including death; or upon the discretion of your surgeon and anesthesiologist your surgery may need to be rescheduled.  Do not eat food after midnight the night before surgery.  No gum chewing, lozengers or hard candies.  You may however, drink CLEAR liquids up to 2 hours before you are scheduled to arrive for your surgery. Do not drink anything within 2 hours of your scheduled arrival time.  Clear liquids include: - water  - apple juice without pulp - gatorade (not RED colors) - black coffee or tea (Do NOT add milk or creamers to the coffee or tea) Do NOT drink anything that is not on this list.  Type 1 and Type 2 diabetics should only drink water.  In addition, your doctor has ordered for you to drink the provided  Ensure Pre-Surgery Clear Carbohydrate Drink  Drinking this carbohydrate drink up to two hours before surgery helps to reduce insulin resistance and improve patient outcomes. Please complete drinking 2 hours prior to scheduled arrival time.  TAKE THESE MEDICATIONS THE MORNING OF SURGERY WITH A SIP OF WATER: -amLODipine (NORVASC) -ezetimibe (ZETIA) -gabapentin (NEURONTIN) -levothyroxine (SYNTHROID) -rosuvastatin (CRESTOR) -predniSONE (DELTASONE)  -loratadine (CLARITIN)  -traMADol (ULTRAM) -famotidine (PEPCID)-take one the night before and one on the morning of surgery - helps to prevent nausea after surgery.)  Stop your 81 mg Aspirin 2 days prior to surgery-Last dose on 10-21-21 Saturday  One  week prior to surgery: Stop Anti-inflammatories (NSAIDS) such as Advil, Aleve, Ibuprofen, Motrin, Naproxen, Naprosyn and Aspirin based products such as Excedrin, Goodys Powder, BC Powder.You may however, take Tylenol/Tramadol if needed for pain up until the day of surgery.  Stop ANY OVER THE COUNTER supplements/vitamins NOW (10-19-21) until after surgery (B Complex, Calcium, Magnesium, Vitamin D3 and Niacin)  No Alcohol for 24 hours before or after surgery.  No Smoking including e-cigarettes for 24 hours prior to surgery.  No chewable tobacco products for at least 6 hours prior to surgery.  No nicotine patches on the day of surgery.  Do not use any "recreational" drugs for at least a week prior to your surgery.  Please be advised that the combination of cocaine and anesthesia may have negative outcomes, up to and including death. If you test positive for cocaine, your surgery will be cancelled.  On the morning of surgery brush your teeth with toothpaste and water, you may rinse your mouth with mouthwash if you wish. Do not swallow any toothpaste or mouthwash.  Use CHG Soap as directed on instruction sheet.  Do not wear jewelry, make-up, hairpins, clips or nail polish.  Do not wear lotions, powders, or perfumes.   Do not shave body from the neck down 48 hours prior to surgery just in case you cut yourself which could leave a site for infection.  Also, freshly shaved skin may become irritated if using the CHG soap.  Contact lenses, hearing aids and dentures may not be worn into surgery.  Do not bring valuables to the hospital. [facility]  is not responsible for any missing/lost belongings or valuables.  Notify your doctor if there is any change in your medical condition (cold, fever, infection).  Wear comfortable clothing (specific to your surgery type) to the hospital.  After surgery, you can help prevent lung complications by doing breathing exercises.  Take deep breaths and cough  every 1-2 hours. Your doctor may order a device called an Incentive Spirometer to help you take deep breaths. When coughing or sneezing, hold a pillow firmly against your incision with both hands. This is called "splinting." Doing this helps protect your incision. It also decreases belly discomfort.  If you are being admitted to the hospital overnight, leave your suitcase in the car. After surgery it may be brought to your room.  If you are being discharged the day of surgery, you will not be allowed to drive home. You will need a responsible adult (18 years or older) to drive you home and stay with you that night.   If you are taking public transportation, you will need to have a responsible adult (18 years or older) with you. Please confirm with your physician that it is acceptable to use public transportation.   Please call the [facility]. at [phone] if you have any questions about these instructions.  Surgery Visitation Policy:  Patients undergoing a surgery or procedure may have two family members or support persons with them as long as the person is not COVID-19 positive or experiencing its symptoms.   Inpatient Visitation:    Visiting hours are 7 a.m. to 8 p.m. Up to four visitors are allowed at one time in a patient room, including children. The visitors may rotate out with other people during the day. One designated support person (adult) may remain overnight.    How to Use an Incentive Spirometer An incentive spirometer is a tool that measures how well you are filling your lungs with each breath. Learning to take long, deep breaths using this tool can help you keep your lungs clear and active. This may help to reverse or lessen your chance of developing breathing (pulmonary) problems, especially infection. You may be asked to use a spirometer: After a surgery. If you have a lung problem or a history of smoking. After a long period of time when you have  been unable to move or be active. If the spirometer includes an indicator to show the highest number that you have reached, your health care provider or respiratory therapist will help you set a goal. Keep a log of your progress as told by your health care provider. What are the risks? Breathing too quickly may cause dizziness or cause you to pass out. Take your time so you do not get dizzy or light-headed. If you are in pain, you may need to take pain medicine before doing incentive spirometry. It is harder to take a deep breath if you are having pain. How to use your incentive spirometer  Sit up on the edge of your bed or on a chair. Hold the incentive spirometer so that it is in an upright position. Before you use the spirometer, breathe out normally. Place the mouthpiece in your mouth. Make sure your lips are closed tightly around it. Breathe in slowly and as deeply as you can through your mouth, causing the piston or the ball to rise toward the top of the chamber. Hold your breath for 3-5 seconds, or for as long as possible. If the spirometer includes a coach  indicator, use this to guide you in breathing. Slow down your breathing if the indicator goes above the marked areas. Remove the mouthpiece from your mouth and breathe out normally. The piston or ball will return to the bottom of the chamber. Rest for a few seconds, then repeat the steps 10 or more times. Take your time and take a few normal breaths between deep breaths so that you do not get dizzy or light-headed. Do this every 1-2 hours when you are awake. If the spirometer includes a goal marker to show the highest number you have reached (best effort), use this as a goal to work toward during each repetition. After each set of 10 deep breaths, cough a few times. This will help to make sure that your lungs are clear. If you have an incision on your chest or abdomen from surgery, place a pillow or a rolled-up towel firmly against the  incision when you cough. This can help to reduce pain while taking deep breaths and coughing. General tips When you are able to get out of bed: Walk around often. Continue to take deep breaths and cough in order to clear your lungs. Keep using the incentive spirometer until your health care provider says it is okay to stop using it. If you have been in the hospital, you may be told to keep using the spirometer at home. Contact a health care provider if: You are having difficulty using the spirometer. You have trouble using the spirometer as often as instructed. Your pain medicine is not giving enough relief for you to use the spirometer as told. You have a fever. Get help right away if: You develop shortness of breath. You develop a cough with bloody mucus from the lungs. You have fluid or blood coming from an incision site after you cough. Summary An incentive spirometer is a tool that can help you learn to take long, deep breaths to keep your lungs clear and active. You may be asked to use a spirometer after a surgery, if you have a lung problem or a history of smoking, or if you have been inactive for a long period of time. Use your incentive spirometer as instructed every 1-2 hours while you are awake. If you have an incision on your chest or abdomen, place a pillow or a rolled-up towel firmly against your incision when you cough. This will help to reduce pain. Get help right away if you have shortness of breath, you cough up bloody mucus, or blood comes from your incision when you cough. This information is not intended to replace advice given to you by your health care provider. Make sure you discuss any questions you have with your health care provider. Document Revised: 06/01/2019 Document Reviewed: 06/01/2019 Elsevier Patient Education  [place].

## 2021-10-19 NOTE — Telephone Encounter (Signed)
Urgent tele add on today    Patient Consent for Virtual Visit        Adrian Zavala has provided verbal consent on 10/19/2021 for a virtual visit (video or telephone).   CONSENT FOR VIRTUAL VISIT FOR:  Adrian Zavala  By participating in this virtual visit I agree to the following:  I hereby voluntarily request, consent and authorize [facility] and its employed or contracted physicians, physician assistants, nurse practitioners or other licensed health care professionals (the Practitioner), to provide me with telemedicine health care services (the "Services") as deemed necessary by the treating Practitioner. I acknowledge and consent to receive the Services by the Practitioner via telemedicine. I understand that the telemedicine visit will involve communicating with the Practitioner through live audiovisual communication technology and the disclosure of certain medical information by electronic transmission. I acknowledge that I have been given the opportunity to request an in-person assessment or other available alternative prior to the telemedicine visit and am voluntarily participating in the telemedicine visit.  I understand that I have the right to withhold or withdraw my consent to the use of telemedicine in the course of my care at any time, without affecting my right to future care or treatment, and that the Practitioner or I may terminate the telemedicine visit at any time. I understand that I have the right to inspect all information obtained and/or recorded in the course of the telemedicine visit and may receive copies of available information for a reasonable fee.  I understand that some of the potential risks of receiving the Services via telemedicine include:  Delay or interruption in medical evaluation due to technological equipment failure or disruption; Information transmitted may not be sufficient (e.g. poor resolution of images) to allow for appropriate medical decision making  by the Practitioner; and/or  In rare instances, security protocols could fail, causing a breach of personal health information.  Furthermore, I acknowledge that it is my responsibility to provide information about my medical history, conditions and care that is complete and accurate to the best of my ability. I acknowledge that Practitioner's advice, recommendations, and/or decision may be based on factors not within their control, such as incomplete or inaccurate data provided by me or distortions of diagnostic images or specimens that may result from electronic transmissions. I understand that the practice of medicine is not an exact science and that Practitioner makes no warranties or guarantees regarding treatment outcomes. I acknowledge that a copy of this consent can be made available to me via my patient portal ([facility]), or I can request a printed copy by calling the office of [facility].    I understand that my insurance will be billed for this visit.   I have read or had this consent read to me. I understand the contents of this consent, which adequately explains the benefits and risks of the Services being provided via telemedicine.  I have been provided ample opportunity to ask questions regarding this consent and the Services and have had my questions answered to my satisfaction. I give my informed consent for the services to be provided through the use of telemedicine in my medical care

## 2021-10-19 NOTE — Progress Notes (Signed)
Virtual Visit via Telephone Note   Because of Adrian Zavala's co-morbid illnesses, he is at least at moderate risk for complications without adequate follow up.  This format is felt to be most appropriate for this patient at this time.  The patient did not have access to video technology/had technical difficulties with video requiring transitioning to audio format only (telephone).  All issues noted in this document were discussed and addressed.  No physical exam could be performed with this format.  Please refer to the patient's chart for his consent to telehealth for [facility].  Evaluation Performed:  Preoperative cardiovascular risk assessment _____________   Date:  10/19/2021   Patient ID:  Adrian Zavala, DOB [birth year], MRN [id number] Patient Location:  Home Provider location:   Office  Primary Care Provider:  Olin Hauser, DO Primary Cardiologist:  Ida Rogue, MD  Chief Complaint / Patient Profile   71 y.o. y/o male with a h/o coronary artery disease (on CT) , dilation of ascending aorta, bicuspid aortic valve, hypertension, hyperlipidemia BPH, GERD, hypothyroidism and Wegener's s/p chemotherapy who is pending right total hip arthroplasty on 10/24/2021 with Dr. Milagros Evener and presents today for telephonic preoperative cardiovascular risk assessment.  Past Medical History    Past Medical History:  Diagnosis Date   Anemia    Arthritis    Spine, Left Hip, Hands   Ascending aortic aneurysm (HCC)    Bicuspid aortic valve    BPH (benign prostatic hyperplasia)    Coronary artery disease    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HCC)    Elevated hemoglobin A1c    Fatty liver disease, nonalcoholic    GERD (gastroesophageal reflux disease)    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HCC)    Heart murmur    Hemorrhoid    Hypercholesteremia    Hypertension    Hypothyroidism    graves   IBS (irritable bowel syndrome)    Lung disorder    S/P chemo for Wegener's   Neuromuscular  disorder ([place])    some leg weakness S/P Chemo for Wegener's   Osteopenia    Osteoporosis    Femur   Pre-diabetes    Seasonal allergies    Wegener's granulomatosis    Past Surgical History:  Procedure Laterality Date   CHOLECYSTECTOMY     has "metal sutures"    COLONOSCOPY N/A 10/25/2014   Procedure: COLONOSCOPY;  Surgeon: Lucilla Lame, MD;  Location: [facility];  Service: Gastroenterology;  Laterality: N/A;   TREATMENT FISTULA ANAL     closure with rectal advancement flap   VASECTOMY      Allergies  Allergies  Allergen Reactions   Sulfamethoxazole-Trimethoprim Anaphylaxis   Ampicillin    Keflex [Cephalexin]    Penicillins Other (See Comments)    Passes out   Quinolones     Fluoroquinolones not recommended in the setting of aortic dilation   Codeine Itching, Other (See Comments) and Rash    tingling Rash and dizziness   Prilosec [Omeprazole] Hives and Rash   Tetracyclines & Related Rash    History of Present Illness    Adrian Zavala is a 71 y.o. male who presents via audio/video conferencing for a telehealth visit today.  Pt was last seen in cardiology clinic on 11/23/2020 by Dr. Rockey Situ. At that time Adrian Zavala was doing well. The patient is now pending procedure as outlined above. Since his last visit, he has done well from a cardiac standpoint. He denies chest pain, palpitations, dyspnea, pnd, orthopnea, n, v, dizziness, syncope, edema,  weight gain, or early satiety. All other systems reviewed and are otherwise negative except as noted above.   Home Medications    Prior to Admission medications   Medication Sig Start Date End Date Taking? Authorizing Provider  acetaminophen (TYLENOL) 650 MG CR tablet Take 650 mg by mouth every 8 (eight) hours as needed for pain or fever.    [provider]  amLODipine (NORVASC) 10 MG tablet Take 1 tablet (10 mg total) by mouth daily. Patient taking differently: Take 10 mg by mouth every morning. 11/23/20   Minna Merritts, MD  aspirin 81 MG tablet Take 81 mg by mouth daily. AM    [provider]  B Complex-C-Folic Acid (B-COMPLEX BALANCED PO) Take 1 tablet by mouth daily.    [provider]  calcium carbonate (OS-CAL) 600 MG TABS tablet Take 600 mg by mouth daily. AM    [provider]  Cholecalciferol (VITAMIN D3) 25 MCG (1000 UT) CAPS Take 1 capsule by mouth daily.    [provider]  ezetimibe (ZETIA) 10 MG tablet TAKE 1 TABLET BY MOUTH  DAILY Patient taking differently: Take 10 mg by mouth every morning. 01/09/21   Minna Merritts, MD  famotidine (PEPCID) 40 MG tablet TAKE 1 TABLET BY MOUTH  DAILY Patient taking differently: Take 40 mg by mouth every morning. 09/08/21   Karamalegos, Devonne Doughty, DO  FIBER PO Take 1 capsule by mouth 2 (two) times daily.    [provider]  fluticasone (FLONASE) 50 MCG/ACT nasal spray Place 2 sprays into both nostrils daily. Use for 4-6 weeks then stop and use seasonally or as needed. Patient taking differently: Place 2 sprays into both nostrils as needed. Use for 4-6 weeks then stop and use seasonally or as needed. 10/27/18   Karamalegos, Devonne Doughty, DO  gabapentin (NEURONTIN) 300 MG capsule Take 300 mg by mouth 3 (three) times daily. 08/30/19   [provider]  ibandronate (BONIVA) 150 MG tablet Take 150 mg by mouth every 30 (thirty) days. 01/26/19   [provider]  ibuprofen (ADVIL) 200 MG tablet Take 200 mg by mouth every 6 (six) hours as needed.    [provider]  ipratropium (ATROVENT) 0.06 % nasal spray Place 2 sprays into both nostrils 4 (four) times daily as needed for rhinitis. 06/28/21   Karamalegos, Devonne Doughty, DO  levothyroxine (SYNTHROID) 25 MCG tablet TAKE 1 TABLET BY MOUTH  DAILY Patient taking differently: Take by mouth daily before breakfast. 01/07/21   Parks Ranger, Devonne Doughty, DO  loratadine (CLARITIN) 10 MG tablet Take 10 mg by mouth every morning.    [provider]   magnesium oxide (MAG-OX) 400 MG tablet Take 400 mg by mouth 2 (two) times daily. 07/27/15   [provider]  niacin (NIASPAN) 500 MG CR tablet Take 750 mg by mouth at bedtime.    [provider]  predniSONE (DELTASONE) 5 MG tablet Take 5-20 mg by mouth daily. Depending on flare up of Wegeners    [provider]  rosuvastatin (CRESTOR) 40 MG tablet Take 1 tablet (40 mg total) by mouth daily. Patient taking differently: Take 40 mg by mouth every morning. 01/09/21 01/04/22  Minna Merritts, MD  traMADol (ULTRAM) 50 MG tablet Take 1 tablet (50 mg total) by mouth every 6 (six) hours as needed. Patient taking differently: Take 50 mg by mouth 3 (three) times daily. 08/29/21   Olin Hauser, DO    Physical Exam  Vital Signs:  Linton Stolp does not have vital signs available for review today.  Given telephonic nature of communication, physical exam is limited. AAOx3. NAD. Normal affect.  Speech and respirations are unlabored.  Accessory Clinical Findings    None  Assessment & Plan    1.  Preoperative Cardiovascular Risk Assessment:  According to the Revised Cardiac Risk Index (RCRI), his Perioperative Risk of Major Cardiac Event is (%): 0.4. His Functional Capacity in METs is: 4.3 according to the [place] Activity Status Index (DASI).Therefore, based on ACC/AHA guidelines, patient would be at acceptable risk for the planned procedure without further cardiovascular testing.   Per office protocol, patient may hold aspirin for 5 to 7 days prior to surgery.  Please resume aspirin as soon as possible postprocedure, the discretion of the surgeon.  A copy of this note will be routed to requesting surgeon.  Time:   Today, I have spent 6 minutes with the patient with telehealth technology discussing medical history, symptoms, and management plan.     Lenna Sciara, NP  10/19/2021, 4:10 PM

## 2021-10-19 NOTE — Telephone Encounter (Signed)
1st attempt to reach pt regarding surgical clearance and the need for a televisit.  Left a message for pt to call back and ask for the PREOP TEAM.

## 2021-10-19 NOTE — Telephone Encounter (Signed)
-----   Message from Karen Kitchens, NP sent at 10/19/2021  1:11 PM EDT ----- Regarding: Request for pre-operative cardiac clearance Request for pre-operative cardiac clearance:  1. What type of surgery is being performed?  RIGHT TOTAL HIP ARTHROPLASTY  2. When is this surgery scheduled?  10/24/2021  3. Type of clearance being requested (medical, pharmacy, both). BOTH   4. Are there any medications that need to be held prior to surgery? ASA  5. Practice name and name of physician performing surgery?  Performing surgeon: Dr. Milagros Evener, MD Requesting clearance: Honor Loh, FNP-C    6. Anesthesia type (none, local, MAC, general)? GENERAL  7. What is the office phone and fax number?   Phone: [phone] Fax: [phone]  ATTENTION: Unable to create telephone message as per your standard workflow. Directed by [facility] providers to send requests for cardiac clearance to this pool for appropriate distribution to provider covering pre-operative clearances.   Honor Loh, MSN, APRN, FNP-C, CEN [facility]  Peri-operative Services Nurse Practitioner Phone: [phone] 10/19/21 1:11 PM

## 2021-10-19 NOTE — Telephone Encounter (Signed)
   Name: Adrian Zavala  DOB: 04/19/1950  MRN: [id number]  Primary Cardiologist: Ida Rogue, MD   Preoperative team, please contact this patient and set up a phone call appointment for further preoperative risk assessment. Please obtain consent and complete medication review. Thank you for your help.  I confirm that guidance regarding antiplatelet and oral anticoagulation therapy has been completed and, if necessary, noted below.  If patient remains asymptomatic, per office protocol, patient may hold aspirin for 5 to 7 days prior to surgery.  Please resume aspirin as soon as possible postprocedure, the discretion of the surgeon.   Lenna Sciara, NP 10/19/2021, 1:19 PM [facility] [address] [place], [place] [address]

## 2021-10-19 NOTE — Telephone Encounter (Signed)
Patient was returning call. Please advise ?

## 2021-10-19 NOTE — Telephone Encounter (Signed)
Left message for pt to call back to be added on as urgent tele pre op appt today per pre op provider.

## 2021-10-20 ENCOUNTER — Encounter: Payer: Self-pay | Admitting: Surgery

## 2021-10-20 NOTE — Progress Notes (Signed)
Perioperative Services Pre-Admission/Anesthesia Testing   Date: 10/20/21 Name: Adrian Zavala MRN:   [id number]  Re: Consideration of preoperative prophylactic antibiotic change   Request sent to: Poggi, Marshall Cork, MD (routed and/or faxed via [facility])  Planned Surgical Procedure(s):    Case: [id number] Date/Time: 10/24/21 1014   Procedure: TOTAL HIP ARTHROPLASTY (Right: Hip)   Anesthesia type: Choice   Pre-op diagnosis: PRIMARY OSTEOARTHRITIS OF RIGHT HIP.   Location: [facility] OR ROOM 03 / [place] ORS FOR ANESTHESIA GROUP   Surgeons: Corky Mull, MD   Clinical Notes:  Patient has a documented allergy/intolerance to PCN  Advising that PCN has caused him to "pass out" in the past.   Screened as appropriate for cephalosporin use during medication reconciliation No immediate angioedema, dysphagia, SOB, anaphylaxis symptoms. No severe rash involving mucous membranes or skin necrosis. No hospital admissions related to side effects of PCN/cephalosporin use.  No documented reaction to PCN or cephalosporin in the last 10 years.  Request:  As an evidence based approach to reducing the rate of incidence for post-operative SSI and the development of MDROs, could an agent that allows for narrower antimicrobial coverage for preoperative prophylaxis in this patient's upcoming surgical course be considered?   Currently ordered preoperative prophylactic ABX: vancomycin.   Specifically requesting change to cephalosporin (CEFAZOLIN).  Drug of choice for many procedures; it is the most widely studied antimicrobial agent with proven efficacy for antimicrobial prophylaxis.   Desirable duration of action, spectrum of activity against organisms commonly encountered in surgery, and it has an excellent safety profile and low cost.   Active against streptococci, methicillin-susceptible staphylococci, and many gram-negative organisms.  Please communicate decision with me and I will change the orders in Epic as per  your direction.   Things to consider: Many patients report that they were "allergic" to PCN earlier in life, however this does not translate into a true lifelong allergy. Patients can lose sensitivity to specific IgE antibodies over time if PCN is avoided (Kleris & Lugar, 2019).  Up to 10% of the adult population and 15% of hospitalized patients report an allergy to PCN, however clinical studies suggest that 90% of those reporting an allergy can tolerate PCN antibiotics (Kleris & Lugar, 2019).  Cross-sensitivity between PCN and cephalosporins has been documented as being as high as 10%, however this estimation included data believed to have been collected in a setting where there was contamination. Newer data suggests that the prevalence of cross-sensitivity between PCN and cephalosporins is actually estimated to be closer to 1% (Hermanides et al., 2018).   Patients labeled as PCN allergic, whether they are truly allergic or not, have been found to have inferior outcomes in terms of rates of serious infection, and these patients tend to have longer hospital stays ([place], 2019).  Treatment related secondary infections, such as Clostridioides difficile, have been linked to the improper use of broad spectrum antibiotics in patients improperly labeled as PCN allergic (Kleris & Lugar, 2019).  Anaphylaxis from cephalosporins is rare and the evidence suggests that there is no increased risk of an anaphylactic type reaction when cephalosporins are used in a PCN allergic patient (Pichichero, 2006).  Citations: Hermanides J, Lemkes BA, Prins Pearla Dubonnet MW, Terreehorst I. Presumed ?-Lactam Allergy and Cross-reactivity in the Operating Theater: A Practical Approach. Anesthesiology. 2018 Aug;129(2):335-342. doi: 10.1097/ALN.0000000000002252. PMID: 46270350.  Kleris, [place] & Lugar, P. L. (2019). Things We Do For No Reason: Failing to Question a Penicillin Allergy History. Journal of hospital medicine,  14(10), F6008577. Advance online publication. https://www.wallace-middleton.info/  Pichichero, M. E. (2006). Cephalosporins can be prescribed safely for penicillin-allergic patients. Journal of family medicine, 55(2), 106-112. Accessed: https://cdn.mdedge.com/files/s5f-public/Document/September-2017/5502JFP_AppliedEvidence1.pdf   BHonor Loh MSN, APRN, FNP-C, CEN [facility] Peri-operative Services Nurse Practitioner FAX: ([phone]/28/23 8:12 AM

## 2021-10-20 NOTE — Progress Notes (Signed)
Perioperative Services  Pre-Admission/Anesthesia Testing Clinical Review  Date: 10/20/21  Patient Demographics:  Name: Adrian Zavala DOB:   06-23-1950 MRN:   [id number]  Planned Surgical Procedure(s):    Case: [id number] Date/Time: 10/24/21 1014   Procedure: TOTAL HIP ARTHROPLASTY (Right: Hip)   Anesthesia type: Choice   Pre-op diagnosis: PRIMARY OSTEOARTHRITIS OF RIGHT HIP.   Location: [facility] OR ROOM 03 / [place] ORS FOR ANESTHESIA GROUP   Surgeons: Corky Mull, MD   NOTE: Available PAT nursing documentation and vital signs have been reviewed. Clinical nursing staff has updated patient's PMH/PSHx, current medication list, and drug allergies/intolerances to ensure comprehensive history available to assist in medical decision making as it pertains to the aforementioned surgical procedure and anticipated anesthetic course. Extensive review of available clinical information performed.  PMH and PSHx updated with any diagnoses/procedures that  may have been inadvertently omitted during his intake with the pre-admission testing department's nursing staff.  Clinical Discussion:  Adrian Zavala is a 71 y.o. male who is submitted for pre-surgical anesthesia review and clearance prior to him undergoing the above procedure. Patient is a Former Smoker (30 pack years; quit 08/1996). Pertinent PMH includes: CAD, diastolic dysfunction, aortic stenosis, ascending aorta dilatation, aortic atherosclerosis, cardiac murmur, RBBB, HTN, HLD, prediabetes, hypothyroidism, exogenous Cushing syndrome, GERD (on daily H2 blocker), Wegener's granulomatosis with associated polyangiitis, anemia, lower extremity weakness, osteoporosis, OA, lumbar stenosis, BPH, Crohn's disease/IBS.  Patient is followed by cardiology Rockey Situ, MD). He was last seen in the cardiology clinic on 11/23/2020; notes reviewed.  At the time of this clinic visit, patient doing well overall from a cardiovascular perspective.  He denied any episodes  of chest pain, shortness breath, PND, orthopnea, palpitations, significant peripheral edema, vertiginous symptoms, or presyncope/syncope.  Patient with a past medical history significant for cardiovascular diagnoses.  Myocardial perfusion imaging study performed on 05/14/2016 revealed a normal left ventricular systolic function with an EF of 55-65%.  There was 1 mm ST depression noted during stress in leads V3-V6.  Changes suggestive of ischemia, however there was noted motion artifact.  There was no evidence of a stress-induced myocardial ischemia or arrhythmia. Blood pressure demonstrated normal response to exercise.  Study determined to be normal and low risk.  Coronary CTA performed on 08/04/2020 revealed an elevated coronary calcium score of 975, which was 87th percentile for age, sex, and race matched control.  There was normal coronary origin with RIGHT dominance.  Calcified plaque noted in the RCA and proximal LAD causing a approximately 50% stenosis.  Mild ascending aortic dilatation noted with a maximum diameter of 39 mm.  Aortic valve trileaflet/tricuspid.  FFR analysis revealed no hemodynamically significant stenosis.  LM = no significant stenosis LAD = 0.8 LCx = 0.9 RCA = not performed due to significant motion artifact  Most recent TTE was performed on 08/15/2020 revealing a normal left ventricular systolic function with an EF of 60 to 65%.  There were no regional wall motion abnormalities. Diastolic Doppler parameters consistent with abnormal relaxation (G1DD).  Mild mitral valve regurgitation noted.  Moderate aortic valve sclerosis/calcification present with no evidence of aortic valve stenosis.  Borderline dilatation of the ascending aorta noted with a maximum diameter of 38 mm. Of note, previous echocardiogram from 06/10/2017 revealed very mild aortic valve stenosis with a mean pressure gradient of 5 mmHg; AVA (VTI) = 2.22 cm.    Blood pressure elevated at 150/82 mmHg on currently  prescribed CCB (amlodipine) monotherapy.  Patient is on rosuvastatin + ezetimibe + niacin for  his HLD diagnosis and further ASCVD prevention.  Patient with a prediabetes diagnosis; last HgbA1c was 6.0% when checked on 12/19/2020.  Functional capacity somewhat limited by patient's current arthritides, however patient still felt to be able to achieve at least 4 METS of activity without angina/anginal equivalent symptoms.  Given elevated blood pressure noted in clinic, patient's amlodipine dose was uptitrated to 10 mg daily.  No other changes were made to his medication regimen. Patient to follow-up with outpatient cardiology in 1 year or sooner if needed.  Adrian Zavala is scheduled for an elective TOTAL RIGHT HIP ARTHROPLASTY on 10/24/2021 with Dr. Milagros Evener, MD.  Given patient's past medical history significant for cardiovascular diagnoses, presurgical cardiac clearance was sought by the PAT team.  Per cardiology, "according to the RCRI, his perioperative risk of MACE is 0.4%.  His functional capacity and METS is 4.3 according to the DASI.  Therefore based on ACC/AHA guidelines, patient would be at an ACCEPTABLE risk for the planned procedure without further cardiovascular testing". In review of his medication reconciliation, it is noted the patient is on daily antiplatelet therapy. He has been instructed on recommendations from his cardiologist for holding his daily low-dose ASA for 2 days prior to his procedure with plans to restart as soon as postoperative bleeding risk felt to be minimized by his primary attending surgeon. The patient is aware that his last dose of ASA should be on 10/21/2021.  P yesatient denies previous perioperative complications with anesthesia in the past. In review of the available records, it is noted that patient underwent a MAC anesthetic course at [facility] (ASA III) in 10/2014 without documented complications.      10/19/2021   12:02 PM 06/28/2021    8:31 AM  12/19/2020    8:06 AM  Vitals with BMI  Height _0  _1  _2   Weight 193 lbs 9 oz 189 lbs 10 oz 182 lbs 10 oz  BMI 31.26 32.44 01.02  Systolic 725 366 440  Diastolic 78 68 66  Pulse 99 74 77    Providers/Specialists:   NOTE: Primary physician provider listed below. Patient may have been seen by APP or partner within same practice.   PROVIDER ROLE / SPECIALTY LAST OV  Poggi, Marshall Cork, MD Orthopedics (Surgeon) 10/13/2021  Olin Hauser, DO Primary Care Provider 06/28/2021  Ida Rogue, MD Cardiology 11/23/2020  Ephriam Jenkins, MD Rheumatology 10/18/2021   Allergies:  Sulfamethoxazole-trimethoprim, Ampicillin, Keflex [cephalexin], Penicillins, Quinolones, Codeine, Prilosec [omeprazole], and Tetracyclines & related  Current Home Medications:   No current facility-administered medications for this encounter.    acetaminophen (TYLENOL) 650 MG CR tablet   amLODipine (NORVASC) 10 MG tablet   aspirin 81 MG tablet   B Complex-C-Folic Acid (B-COMPLEX BALANCED PO)   calcium carbonate (OS-CAL) 600 MG TABS tablet   Cholecalciferol (VITAMIN D3) 25 MCG (1000 UT) CAPS   ezetimibe (ZETIA) 10 MG tablet   famotidine (PEPCID) 40 MG tablet   FIBER PO   fluticasone (FLONASE) 50 MCG/ACT nasal spray   gabapentin (NEURONTIN) 300 MG capsule   ibandronate (BONIVA) 150 MG tablet   ibuprofen (ADVIL) 200 MG tablet   ipratropium (ATROVENT) 0.06 % nasal spray   levothyroxine (SYNTHROID) 25 MCG tablet   loratadine (CLARITIN) 10 MG tablet   magnesium oxide (MAG-OX) 400 MG tablet   niacin (NIASPAN) 500 MG CR tablet   predniSONE (DELTASONE) 5 MG tablet   rosuvastatin (CRESTOR) 40 MG tablet   traMADol (ULTRAM) 50 MG tablet  History:   Past Medical History:  Diagnosis Date   Anemia    Aortic atherosclerosis ([place])    Aortic stenosis 06/10/2017   a.) TTE 06/10/2017: EF 60-65%; very mild AS with MPG 5 mmHG; AVA (VTI) = 2.22 cm   Arthritis    Spine, Left Hip, Hands   Ascending aorta  dilatation ([place]) 05/07/2016   a.) cCTA 05/07/2016: measured 41 mm; b.) TTE 06/10/2017: measured 39 mm; c.) cCTA 08/04/2020: measured 39 mm; d.) TTE 08/15/2020: measured 38 mm   BPH (benign prostatic hyperplasia)    Coronary artery disease    Crohn's disease ([place])    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[place])    a.) exogenous 2/2 prolonged corticosteroid use   Diastolic dysfunction 43/32/9518   a.) TTE 08/15/2020: EF 60-65%, mild MR, mod AoV sclerosis, G1DD   Elevated hemoglobin A1c    Fatty liver disease, nonalcoholic    GERD (gastroesophageal reflux disease)    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H[facility])    Heart murmur    Hemorrhoid    Hypercholesteremia    Hypertension    Hypothyroidism    IBS (irritable bowel syndrome)    Inflammatory eye disease    Long term current use of systemic steroids    a.) low dose prednisone   Long-term current use of bisphosphonate    a.) ibandronate   Lower extremity weakness    a.) s/p chemo for Wegener's   Lumbar stenosis    Lung disorder    S/P chemo for Wegener's   Osteopenia    Osteoporosis    Pre-diabetes    RBBB (right bundle branch block)    Seasonal allergies    Wegener's granulomatosis    a.) s/p Tx with cyclophosphamide + MTX; ongoing treatment with chronic corticosteroids   Past Surgical History:  Procedure Laterality Date   CHOLECYSTECTOMY     has "metal sutures"    COLONOSCOPY N/A 10/25/2014   Procedure: COLONOSCOPY;  Surgeon: Lucilla Lame, MD;  Location: [facility];  Service: Gastroenterology;  Laterality: N/A;   TREATMENT FISTULA ANAL     closure with rectal advancement flap   VASECTOMY     Family History  Family history unknown: Yes   Social History   Tobacco Use   Smoking status: Former    Packs/day: 1.00    Years: 30.00    Total pack years: 30.00    Types: Cigarettes    Quit date: 08/24/1996    Years since quitting: 25.1   Smokeless tobacco: Former  Scientific laboratory technician Use: Never used  Substance Use Topics   Alcohol use:  Yes    Alcohol/week: 3.0 standard drinks of alcohol    Types: 3 Cans of beer per week    Comment: occ   Drug use: No    Pertinent Clinical Results:  LABS: Labs reviewed: Acceptable for surgery.  Hospital Outpatient Visit on 10/19/2021  Component Date Value Ref Range Status   MRSA, PCR 10/19/2021 NEGATIVE  NEGATIVE Final   Staphylococcus aureus 10/19/2021 NEGATIVE  NEGATIVE Final   Comment: (NOTE) The Xpert SA Assay (FDA approved for NASAL specimens in patients 72 years of age and older), is one component of a comprehensive surveillance program. It is not intended to diagnose infection nor to guide or monitor treatment. Performed at [facility], [place], [place] [address]    WBC 10/19/2021 13.2 (H)  4.0 - 10.5 K/uL Final   RBC 10/19/2021 4.53  4.22 - 5.81 MIL/uL Final   Hemoglobin 10/19/2021  14.3  13.0 - 17.0 g/dL Final   HCT 10/19/2021 42.7  39.0 - 52.0 % Final   MCV 10/19/2021 94.3  80.0 - 100.0 fL Final   MCH 10/19/2021 31.6  26.0 - 34.0 pg Final   MCHC 10/19/2021 33.5  30.0 - 36.0 g/dL Final   RDW 10/19/2021 13.2  11.5 - 15.5 % Final   Platelets 10/19/2021 196  150 - 400 K/uL Final   nRBC 10/19/2021 0.0  0.0 - 0.2 % Final   Neutrophils Relative % 10/19/2021 74  % Final   Neutro Abs 10/19/2021 9.8 (H)  1.7 - 7.7 K/uL Final   Lymphocytes Relative 10/19/2021 12  % Final   Lymphs Abs 10/19/2021 1.6  0.7 - 4.0 K/uL Final   Monocytes Relative 10/19/2021 9  % Final   Monocytes Absolute 10/19/2021 1.2 (H)  0.1 - 1.0 K/uL Final   Eosinophils Relative 10/19/2021 2  % Final   Eosinophils Absolute 10/19/2021 0.3  0.0 - 0.5 K/uL Final   Basophils Relative 10/19/2021 1  % Final   Basophils Absolute 10/19/2021 0.2 (H)  0.0 - 0.1 K/uL Final   Immature Granulocytes 10/19/2021 2  % Final   Abs Immature Granulocytes 10/19/2021 0.20 (H)  0.00 - 0.07 K/uL Final   Performed at [facility], [place], [place] [address]   Sodium 10/19/2021  142  135 - 145 mmol/L Final   Potassium 10/19/2021 3.8  3.5 - 5.1 mmol/L Final   Chloride 10/19/2021 108  98 - 111 mmol/L Final   CO2 10/19/2021 25  22 - 32 mmol/L Final   Glucose, Bld 10/19/2021 100 (H)  70 - 99 mg/dL Final   Glucose reference range applies only to samples taken after fasting for at least 8 hours.   BUN 10/19/2021 24 (H)  8 - 23 mg/dL Final   Creatinine, Ser 10/19/2021 1.12  0.61 - 1.24 mg/dL Final   Calcium 10/19/2021 10.3  8.9 - 10.3 mg/dL Final   Total Protein 10/19/2021 6.9  6.5 - 8.1 g/dL Final   Albumin 10/19/2021 4.4  3.5 - 5.0 g/dL Final   AST 10/19/2021 31  15 - 41 U/L Final   ALT 10/19/2021 37  0 - 44 U/L Final   Alkaline Phosphatase 10/19/2021 64  38 - 126 U/L Final   Total Bilirubin 10/19/2021 0.7  0.3 - 1.2 mg/dL Final   GFR, Estimated 10/19/2021 >60  >60 mL/min Final   Comment: (NOTE) Calculated using the CKD-EPI Creatinine Equation (2021)    Anion gap 10/19/2021 9  5 - 15 Final   Performed at [facility], [place], [place] [address]   Color, Urine 10/19/2021 AMBER (A)  YELLOW Final   BIOCHEMICALS MAY BE AFFECTED BY COLOR   APPearance 10/19/2021 CLOUDY (A)  CLEAR Final   Specific Gravity, Urine 10/19/2021 1.019  1.005 - 1.030 Final   pH 10/19/2021 5.0  5.0 - 8.0 Final   Glucose, UA 10/19/2021 NEGATIVE  NEGATIVE mg/dL Final   Hgb urine dipstick 10/19/2021 NEGATIVE  NEGATIVE Final   Bilirubin Urine 10/19/2021 NEGATIVE  NEGATIVE Final   Ketones, ur 10/19/2021 NEGATIVE  NEGATIVE mg/dL Final   Protein, ur 10/19/2021 NEGATIVE  NEGATIVE mg/dL Final   Nitrite 10/19/2021 NEGATIVE  NEGATIVE Final   Leukocytes,Ua 10/19/2021 NEGATIVE  NEGATIVE Final   Performed at [facility], [address] [place], [place] [address]   ABO/RH(D) 10/19/2021 O POS   Final   Antibody Screen 10/19/2021 NEG  Final   Sample Expiration 10/19/2021 11/02/2021,2359   Final   Extend sample reason 10/19/2021    Final                   Value:NO  TRANSFUSIONS OR PREGNANCY IN THE PAST 3 MONTHS Performed at [facility], [address] [place], [place] [address]     ECG: Date: 10/19/2021 Time ECG obtained: 1241 PM Rate: 80 bpm Rhythm:  Normal sinus rhythm; RBBB Axis (leads I and aVF): Normal Intervals: PR 154 ms. QRS 112 ms. QTc 431 ms. ST segment and T wave changes: No evidence of acute ST segment elevation or depression Comparison: Similar to previous tracing obtained on 09/19/2020   IMAGING / PROCEDURES: TRANSTHORACIC ECHOCARDIOGRAM performed on 08/15/2020 Left ventricular ejection fraction, by estimation, is 60 to 65%. The left ventricle has normal function. The left ventricle has no regional wall motion abnormalities. Left ventricular diastolic parameters are consistent with Grade I diastolic dysfunction (impaired relaxation).  Right ventricular systolic function is normal. The right ventricular size is normal. Tricuspid regurgitation signal is inadequate for assessing PA pressure.  The mitral valve is normal in structure. Mild mitral valve regurgitation.  The aortic valve is normal in structure. Moderate aortic valve sclerosis/calcification is present, without any evidence of aortic stenosis.  There is borderline dilatation of the ascending aorta, measuring 38 mm.   CT CORONARY MORPH W/CTA COR W/SCORE W/CA W/CM &/OR WO/CM; RACTIONAL FLOW RESERVE performed on 08/04/2020 No acute intrathoracic abnormality. Similar appearing evidence of prior granulomatous disease. Coronary calcium score of 975. This was 87th percentile for age and sex matched control. Normal coronary origin with right dominance. Calcified plaque causing moderate stenosis in the RCA and proximal LAD. CAD-RADS 3. Moderate stenosis. Consider symptom-guided anti-ischemic pharmacotherapy as well as risk factor modification per guideline directed care.   Mild ascending aorta dilation, maximum diameter 67m. Aortic valve is trileaflet/tricuspid  FFR  analysis demonstrated no significant stenosis. Left Main:  No significant stenosis. LAD: No significant stenosis.  FFRct 0.8 LCX: No significant stenosis.  FFRct 0.9 RCA: FFRct analysis not performed due to significant motion artifact.  MYOCARDIAL PERFUSION IMAGING STUDY (LEXISCAN) performed on 05/14/2016 Normal left ventricular systolic function with an EF of 55-65% Horizontal ST segment depression of 1 mm was noted during stress and leads V3-V6.  These changes are suggestive of ischemia, but there was motion artifact noted. No T wave inversion noted during stress Blood pressure demonstrated normal response to exercise Normal low risk study  Impression and Plan:  RAhad Colarussohas been referred for pre-anesthesia review and clearance prior to him undergoing the planned anesthetic and procedural courses. Available labs, pertinent testing, and imaging results were personally reviewed by me. This patient has been appropriately cleared by cardiology with an overall ACCEPTABLE risk of significant perioperative cardiovascular complications.  Based on clinical review performed today (10/20/21), barring any significant acute changes in the patient's overall condition, it is anticipated that he will be able to proceed with the planned surgical intervention. Any acute changes in clinical condition may necessitate his procedure being postponed and/or cancelled. Patient will meet with anesthesia team (MD and/or CRNA) on the day of his procedure for preoperative evaluation/assessment. Questions regarding anesthetic course will be fielded at that time.   Pre-surgical instructions were reviewed with the patient during his PAT appointment and questions were fielded by PAT clinical staff. Patient was advised that if any questions or concerns arise prior to his procedure then he should return a call to  PAT and/or his surgeon's office to discuss.  Honor Loh, MSN, APRN, FNP-C, CEN [facility]   Peri-operative Services Nurse Practitioner Phone: [phone] Fax: [phone] 10/20/21 1:24 PM  NOTE: This note has been prepared using Dragon dictation software. Despite my best ability to proofread, there is always the potential that unintentional transcriptional errors may still occur from this process.

## 2021-10-21 ENCOUNTER — Other Ambulatory Visit: Payer: Self-pay | Admitting: Cardiovascular Disease

## 2021-10-23 NOTE — Progress Notes (Signed)
  Perioperative Services Pre-Admission/Anesthesia Testing     Date: 10/23/21  Name: Kainoa Swoboda MRN:   [id number]  Re: Change in [place] for upcoming surgery   Case: [id number] Date/Time: 10/24/21 1014   Procedure: TOTAL HIP ARTHROPLASTY (Right: Hip)   Anesthesia type: Choice   Pre-op diagnosis: PRIMARY OSTEOARTHRITIS OF RIGHT HIP.   Location: [facility] OR ROOM 03 / [place] ORS FOR ANESTHESIA GROUP   Surgeons: Corky Mull, MD   Primary attending surgeon was consulted regarding consideration of therapeutic change in antimicrobial agent being used for preoperative prophylaxis in this patient's upcoming surgical case. Following analysis of the risk versus benefits, the patient's primary attending surgeon advised that it would be acceptable to discontinue the ordered vancomycin and place an order for cefazolin 2 gm IV on call to the OR. Orders for this patient were amended by me following collaborative conversation with attending surgeon taking into consideration of risk versus benefits associated with the change in therapy.  Honor Loh, MSN, APRN, FNP-C, CEN [facility]  Peri-operative Services Nurse Practitioner Phone: [phone] 10/23/21 10:11 AM

## 2021-10-24 ENCOUNTER — Observation Stay: Payer: Managed Care, Other (non HMO)

## 2021-10-24 ENCOUNTER — Ambulatory Visit: Payer: Managed Care, Other (non HMO) | Admitting: Urgent Care

## 2021-10-24 ENCOUNTER — Inpatient Hospital Stay
Admission: RE | Admit: 2021-10-24 | Discharge: 2021-10-29 | DRG: 467 | Disposition: A | Discharge status: Home / Self Care | Payer: Managed Care, Other (non HMO) | Attending: Surgery | Admitting: Surgery

## 2021-10-24 ENCOUNTER — Other Ambulatory Visit: Payer: Self-pay

## 2021-10-24 ENCOUNTER — Encounter: Payer: Self-pay | Admitting: Surgery

## 2021-10-24 ENCOUNTER — Encounter
Admission: RE | Disposition: A | Discharge status: Home / Self Care | Payer: Self-pay | Source: Home / Self Care | Attending: Surgery

## 2021-10-24 DIAGNOSIS — Z8261 Family history of arthritis: Secondary | ICD-10-CM

## 2021-10-24 DIAGNOSIS — Z79891 Long term (current) use of opiate analgesic: Secondary | ICD-10-CM

## 2021-10-24 DIAGNOSIS — N400 Enlarged prostate without lower urinary tract symptoms: Secondary | ICD-10-CM | POA: Diagnosis present

## 2021-10-24 DIAGNOSIS — Z880 Allergy status to penicillin: Secondary | ICD-10-CM

## 2021-10-24 DIAGNOSIS — W01198A Fall on same level from slipping, tripping and stumbling with subsequent striking against other object, initial encounter: Secondary | ICD-10-CM | POA: Diagnosis not present

## 2021-10-24 DIAGNOSIS — Z9049 Acquired absence of other specified parts of digestive tract: Secondary | ICD-10-CM

## 2021-10-24 DIAGNOSIS — E039 Hypothyroidism, unspecified: Secondary | ICD-10-CM | POA: Diagnosis present

## 2021-10-24 DIAGNOSIS — Z888 Allergy status to other drugs, medicaments and biological substances status: Secondary | ICD-10-CM

## 2021-10-24 DIAGNOSIS — E249 Cushing's syndrome, unspecified: Secondary | ICD-10-CM | POA: Diagnosis present

## 2021-10-24 DIAGNOSIS — S72121A Displaced fracture of lesser trochanter of right femur, initial encounter for closed fracture: Secondary | ICD-10-CM | POA: Diagnosis not present

## 2021-10-24 DIAGNOSIS — Z881 Allergy status to other antibiotic agents status: Secondary | ICD-10-CM

## 2021-10-24 DIAGNOSIS — I10 Essential (primary) hypertension: Secondary | ICD-10-CM | POA: Diagnosis present

## 2021-10-24 DIAGNOSIS — Z9221 Personal history of antineoplastic chemotherapy: Secondary | ICD-10-CM

## 2021-10-24 DIAGNOSIS — Z79899 Other long term (current) drug therapy: Secondary | ICD-10-CM

## 2021-10-24 DIAGNOSIS — M810 Age-related osteoporosis without current pathological fracture: Secondary | ICD-10-CM | POA: Diagnosis present

## 2021-10-24 DIAGNOSIS — Z8249 Family history of ischemic heart disease and other diseases of the circulatory system: Secondary | ICD-10-CM

## 2021-10-24 DIAGNOSIS — Z883 Allergy status to other anti-infective agents status: Secondary | ICD-10-CM

## 2021-10-24 DIAGNOSIS — M1611 Unilateral primary osteoarthritis, right hip: Principal | ICD-10-CM | POA: Diagnosis present

## 2021-10-24 DIAGNOSIS — Z885 Allergy status to narcotic agent status: Secondary | ICD-10-CM

## 2021-10-24 DIAGNOSIS — Z6831 Body mass index (BMI) 31.0-31.9, adult: Secondary | ICD-10-CM

## 2021-10-24 DIAGNOSIS — Z96641 Presence of right artificial hip joint: Principal | ICD-10-CM

## 2021-10-24 DIAGNOSIS — J302 Other seasonal allergic rhinitis: Secondary | ICD-10-CM | POA: Diagnosis present

## 2021-10-24 DIAGNOSIS — I77810 Thoracic aortic ectasia: Secondary | ICD-10-CM | POA: Diagnosis present

## 2021-10-24 DIAGNOSIS — M9701XA Periprosthetic fracture around internal prosthetic right hip joint, initial encounter: Secondary | ICD-10-CM | POA: Diagnosis not present

## 2021-10-24 DIAGNOSIS — Z79890 Hormone replacement therapy (postmenopausal): Secondary | ICD-10-CM

## 2021-10-24 DIAGNOSIS — R7303 Prediabetes: Secondary | ICD-10-CM | POA: Diagnosis present

## 2021-10-24 DIAGNOSIS — Y92231 Patient bathroom in hospital as the place of occurrence of the external cause: Secondary | ICD-10-CM | POA: Diagnosis not present

## 2021-10-24 DIAGNOSIS — I2510 Atherosclerotic heart disease of native coronary artery without angina pectoris: Secondary | ICD-10-CM | POA: Diagnosis present

## 2021-10-24 DIAGNOSIS — E669 Obesity, unspecified: Secondary | ICD-10-CM | POA: Diagnosis present

## 2021-10-24 DIAGNOSIS — K760 Fatty (change of) liver, not elsewhere classified: Secondary | ICD-10-CM | POA: Diagnosis present

## 2021-10-24 DIAGNOSIS — Z7982 Long term (current) use of aspirin: Secondary | ICD-10-CM

## 2021-10-24 DIAGNOSIS — I700 Atherosclerosis of aorta: Secondary | ICD-10-CM | POA: Diagnosis present

## 2021-10-24 DIAGNOSIS — I4510 Unspecified right bundle-branch block: Secondary | ICD-10-CM | POA: Diagnosis present

## 2021-10-24 DIAGNOSIS — Z87891 Personal history of nicotine dependence: Secondary | ICD-10-CM

## 2021-10-24 DIAGNOSIS — T380X5A Adverse effect of glucocorticoids and synthetic analogues, initial encounter: Secondary | ICD-10-CM | POA: Diagnosis present

## 2021-10-24 DIAGNOSIS — K5090 Crohn's disease, unspecified, without complications: Secondary | ICD-10-CM | POA: Diagnosis present

## 2021-10-24 DIAGNOSIS — K219 Gastro-esophageal reflux disease without esophagitis: Secondary | ICD-10-CM | POA: Diagnosis present

## 2021-10-24 DIAGNOSIS — M3130 Wegener's granulomatosis without renal involvement: Secondary | ICD-10-CM | POA: Diagnosis present

## 2021-10-24 HISTORY — DX: Long term (current) use of systemic steroids: Z79.52

## 2021-10-24 HISTORY — DX: Other specified disorders of eye and adnexa: H57.89

## 2021-10-24 HISTORY — DX: Atherosclerosis of aorta: I70.0

## 2021-10-24 HISTORY — DX: Spinal stenosis, lumbar region without neurogenic claudication: M48.061

## 2021-10-24 HISTORY — PX: TOTAL HIP ARTHROPLASTY: SHX124

## 2021-10-24 HISTORY — DX: Unspecified right bundle-branch block: I45.10

## 2021-10-24 HISTORY — DX: Other symptoms and signs involving the musculoskeletal system: R29.898

## 2021-10-24 HISTORY — DX: Crohn's disease, unspecified, without complications: K50.90

## 2021-10-24 HISTORY — DX: Long term (current) use of bisphosphonates: Z79.83

## 2021-10-24 LAB — ABO/RH: ABO/RH(D): O POS

## 2021-10-24 SURGERY — ARTHROPLASTY, HIP, TOTAL,POSTERIOR APPROACH
Anesthesia: Spinal | Site: Hip | Laterality: Right

## 2021-10-24 MED ORDER — SODIUM CHLORIDE 0.9 % IV SOLN
INTRAVENOUS | Status: DC | PRN
  Administered 2021-10-24: 60 mL

## 2021-10-24 MED ORDER — DIPHENHYDRAMINE HCL 12.5 MG/5ML PO ELIX: 12.5000 mg | ORAL_SOLUTION | ORAL | Status: DC | PRN

## 2021-10-24 MED ORDER — TRIAMCINOLONE ACETONIDE 40 MG/ML IJ SUSP
INTRAMUSCULAR | Status: DC | PRN
  Administered 2021-10-24: 40 mg via INTRA_ARTICULAR

## 2021-10-24 MED ORDER — MIDAZOLAM HCL 5 MG/5ML IJ SOLN
INTRAMUSCULAR | Status: DC | PRN
  Administered 2021-10-24: 2 mg via INTRAVENOUS

## 2021-10-24 MED ORDER — LORATADINE 10 MG PO TABS
10.0000 mg | ORAL_TABLET | ORAL | Status: DC
  Administered 2021-10-25 – 2021-10-29 (×5): 10 mg via ORAL
  Filled 2021-10-24 (×5): qty 1

## 2021-10-24 MED ORDER — PHENYLEPHRINE HCL-NACL 20-0.9 MG/250ML-% IV SOLN
INTRAVENOUS | Status: DC | PRN
  Administered 2021-10-24: 35 ug/min via INTRAVENOUS

## 2021-10-24 MED ORDER — METOCLOPRAMIDE HCL 5 MG/ML IJ SOLN: 5.0000 mg | Freq: Three times a day (TID) | INTRAMUSCULAR | Status: DC | PRN

## 2021-10-24 MED ORDER — MAGNESIUM HYDROXIDE 400 MG/5ML PO SUSP
30.0000 mL | Freq: Every day | ORAL | Status: DC | PRN
  Administered 2021-10-27: 30 mL via ORAL

## 2021-10-24 MED ORDER — CHLORHEXIDINE GLUCONATE 0.12 % MT SOLN
OROMUCOSAL | Status: AC
  Administered 2021-10-24: 15 mL via OROMUCOSAL
  Filled 2021-10-24: qty 15

## 2021-10-24 MED ORDER — METOCLOPRAMIDE HCL 5 MG PO TABS: 5.0000 mg | ORAL_TABLET | Freq: Three times a day (TID) | ORAL | Status: DC | PRN

## 2021-10-24 MED ORDER — BUPIVACAINE-EPINEPHRINE (PF) 0.5% -1:200000 IJ SOLN
INTRAMUSCULAR | Status: DC | PRN
  Administered 2021-10-24: 30 mL

## 2021-10-24 MED ORDER — LEVOTHYROXINE SODIUM 25 MCG PO TABS
25.0000 ug | ORAL_TABLET | Freq: Every day | ORAL | Status: DC
  Administered 2021-10-25 – 2021-10-29 (×5): 25 ug via ORAL
  Filled 2021-10-24 (×5): qty 1

## 2021-10-24 MED ORDER — EZETIMIBE 10 MG PO TABS
10.0000 mg | ORAL_TABLET | Freq: Every day | ORAL | Status: DC
  Administered 2021-10-25 – 2021-10-29 (×5): 10 mg via ORAL
  Filled 2021-10-24 (×5): qty 1

## 2021-10-24 MED ORDER — HYDROCORTISONE SOD SUC (PF) 100 MG IJ SOLR
INTRAMUSCULAR | Status: DC | PRN
  Administered 2021-10-24: 75 mg via INTRAVENOUS

## 2021-10-24 MED ORDER — PHENYLEPHRINE HCL (PRESSORS) 10 MG/ML IV SOLN
INTRAVENOUS | Status: AC
  Filled 2021-10-24: qty 1

## 2021-10-24 MED ORDER — KETOROLAC TROMETHAMINE 15 MG/ML IJ SOLN
15.0000 mg | Freq: Once | INTRAMUSCULAR | Status: AC
  Administered 2021-10-24: 15 mg via INTRAVENOUS

## 2021-10-24 MED ORDER — SODIUM CHLORIDE 0.9 % IR SOLN
Status: DC | PRN
  Administered 2021-10-24: 3000 mL

## 2021-10-24 MED ORDER — SODIUM CHLORIDE 0.9 % IV SOLN: INTRAVENOUS | Status: DC

## 2021-10-24 MED ORDER — ONDANSETRON HCL 4 MG/2ML IJ SOLN: 4.0000 mg | Freq: Four times a day (QID) | INTRAMUSCULAR | Status: DC | PRN

## 2021-10-24 MED ORDER — ACETAMINOPHEN 10 MG/ML IV SOLN
INTRAVENOUS | Status: AC
  Filled 2021-10-24: qty 100

## 2021-10-24 MED ORDER — ROSUVASTATIN CALCIUM 10 MG PO TABS
40.0000 mg | ORAL_TABLET | ORAL | Status: DC
  Administered 2021-10-25 – 2021-10-29 (×5): 40 mg via ORAL
  Filled 2021-10-24 (×5): qty 4

## 2021-10-24 MED ORDER — KETOROLAC TROMETHAMINE 15 MG/ML IJ SOLN
7.5000 mg | Freq: Four times a day (QID) | INTRAMUSCULAR | Status: DC
  Administered 2021-10-24 – 2021-10-25 (×2): 7.5 mg via INTRAVENOUS
  Filled 2021-10-24 (×4): qty 1

## 2021-10-24 MED ORDER — FENTANYL CITRATE (PF) 100 MCG/2ML IJ SOLN
25.0000 ug | INTRAMUSCULAR | Status: DC | PRN
  Administered 2021-10-24 (×3): 25 ug via INTRAVENOUS

## 2021-10-24 MED ORDER — TRAMADOL HCL 50 MG PO TABS
50.0000 mg | ORAL_TABLET | Freq: Four times a day (QID) | ORAL | Status: DC | PRN
  Administered 2021-10-26 – 2021-10-27 (×2): 50 mg via ORAL
  Filled 2021-10-24 (×2): qty 1

## 2021-10-24 MED ORDER — FAMOTIDINE 20 MG PO TABS
40.0000 mg | ORAL_TABLET | ORAL | Status: DC
  Administered 2021-10-25 – 2021-10-29 (×5): 40 mg via ORAL
  Filled 2021-10-24 (×5): qty 2

## 2021-10-24 MED ORDER — IPRATROPIUM BROMIDE 0.06 % NA SOLN
2.0000 | Freq: Four times a day (QID) | NASAL | Status: DC | PRN
Start: 2021-10-24 — End: 2021-10-29

## 2021-10-24 MED ORDER — LACTATED RINGERS IV SOLN: INTRAVENOUS | Status: DC

## 2021-10-24 MED ORDER — CALCIUM POLYCARBOPHIL 625 MG PO TABS
625.0000 mg | ORAL_TABLET | Freq: Two times a day (BID) | ORAL | Status: DC
  Administered 2021-10-24 – 2021-10-29 (×11): 625 mg via ORAL
  Filled 2021-10-24 (×11): qty 1

## 2021-10-24 MED ORDER — ONDANSETRON HCL 4 MG/2ML IJ SOLN: 4.0000 mg | Freq: Once | INTRAMUSCULAR | Status: DC | PRN

## 2021-10-24 MED ORDER — PREDNISONE 10 MG PO TABS
5.0000 mg | ORAL_TABLET | Freq: Every day | ORAL | Status: DC
  Administered 2021-10-25 – 2021-10-29 (×5): 5 mg via ORAL
  Filled 2021-10-24 (×5): qty 1

## 2021-10-24 MED ORDER — ONDANSETRON HCL 4 MG PO TABS: 4.0000 mg | ORAL_TABLET | Freq: Four times a day (QID) | ORAL | Status: DC | PRN

## 2021-10-24 MED ORDER — FENTANYL CITRATE (PF) 100 MCG/2ML IJ SOLN
INTRAMUSCULAR | Status: DC | PRN
  Administered 2021-10-24: 50 ug via INTRAVENOUS

## 2021-10-24 MED ORDER — ORAL CARE MOUTH RINSE: 15.0000 mL | Freq: Once | OROMUCOSAL | Status: AC

## 2021-10-24 MED ORDER — PHENYLEPHRINE HCL-NACL 20-0.9 MG/250ML-% IV SOLN
INTRAVENOUS | Status: AC
  Filled 2021-10-24: qty 250

## 2021-10-24 MED ORDER — 0.9 % SODIUM CHLORIDE (POUR BTL) OPTIME
TOPICAL | Status: DC | PRN
  Administered 2021-10-24: 1000 mL

## 2021-10-24 MED ORDER — ONDANSETRON HCL 4 MG/2ML IJ SOLN
INTRAMUSCULAR | Status: DC | PRN
  Administered 2021-10-24: 4 mg via INTRAVENOUS

## 2021-10-24 MED ORDER — PROPOFOL 10 MG/ML IV BOLUS
INTRAVENOUS | Status: DC | PRN
  Administered 2021-10-24: 40 mg via INTRAVENOUS

## 2021-10-24 MED ORDER — ACETAMINOPHEN 10 MG/ML IV SOLN
INTRAVENOUS | Status: DC | PRN
  Administered 2021-10-24: 1000 mg via INTRAVENOUS

## 2021-10-24 MED ORDER — SODIUM CHLORIDE FLUSH 0.9 % IV SOLN
INTRAVENOUS | Status: AC
  Filled 2021-10-24: qty 40

## 2021-10-24 MED ORDER — GLYCOPYRROLATE 0.2 MG/ML IJ SOLN
INTRAMUSCULAR | Status: DC | PRN
  Administered 2021-10-24: .1 mg via INTRAVENOUS

## 2021-10-24 MED ORDER — GABAPENTIN 300 MG PO CAPS
300.0000 mg | ORAL_CAPSULE | Freq: Three times a day (TID) | ORAL | Status: DC
  Administered 2021-10-24 – 2021-10-29 (×14): 300 mg via ORAL
  Filled 2021-10-24 (×14): qty 1

## 2021-10-24 MED ORDER — ACETAMINOPHEN 325 MG PO TABS
325.0000 mg | ORAL_TABLET | Freq: Four times a day (QID) | ORAL | Status: DC | PRN
  Administered 2021-10-26 (×2): 650 mg via ORAL
  Filled 2021-10-24 (×2): qty 2

## 2021-10-24 MED ORDER — FENTANYL CITRATE (PF) 100 MCG/2ML IJ SOLN
INTRAMUSCULAR | Status: AC
  Administered 2021-10-24: 25 ug via INTRAVENOUS
  Filled 2021-10-24: qty 2

## 2021-10-24 MED ORDER — MIDAZOLAM HCL 2 MG/2ML IJ SOLN
INTRAMUSCULAR | Status: AC
  Filled 2021-10-24: qty 2

## 2021-10-24 MED ORDER — PROPOFOL 500 MG/50ML IV EMUL
INTRAVENOUS | Status: DC | PRN
  Administered 2021-10-24: 75 ug/kg/min via INTRAVENOUS

## 2021-10-24 MED ORDER — OXYCODONE HCL 5 MG PO TABS
5.0000 mg | ORAL_TABLET | ORAL | Status: DC | PRN
  Administered 2021-10-25: 5 mg via ORAL
  Administered 2021-10-26 – 2021-10-28 (×5): 10 mg via ORAL
  Filled 2021-10-24 (×2): qty 2
  Filled 2021-10-24: qty 1
  Filled 2021-10-24 (×3): qty 2

## 2021-10-24 MED ORDER — BUPIVACAINE LIPOSOME 1.3 % IJ SUSP
INTRAMUSCULAR | Status: AC
  Filled 2021-10-24: qty 20

## 2021-10-24 MED ORDER — AMLODIPINE BESYLATE 10 MG PO TABS
10.0000 mg | ORAL_TABLET | ORAL | Status: DC
  Administered 2021-10-25 – 2021-10-29 (×5): 10 mg via ORAL
  Filled 2021-10-24 (×5): qty 1

## 2021-10-24 MED ORDER — NIACIN ER (ANTIHYPERLIPIDEMIC) 500 MG PO TBCR
500.0000 mg | EXTENDED_RELEASE_TABLET | Freq: Every day | ORAL | Status: DC
Start: 2021-10-24 — End: 2021-10-29
  Administered 2021-10-24 – 2021-10-28 (×5): 500 mg via ORAL
  Filled 2021-10-24 (×5): qty 1

## 2021-10-24 MED ORDER — BISACODYL 10 MG RE SUPP: 10.0000 mg | Freq: Every day | RECTAL | Status: DC | PRN

## 2021-10-24 MED ORDER — FLEET ENEMA 7-19 GM/118ML RE ENEM: 1.0000 | ENEMA | Freq: Once | RECTAL | Status: DC | PRN

## 2021-10-24 MED ORDER — DOCUSATE SODIUM 100 MG PO CAPS
100.0000 mg | ORAL_CAPSULE | Freq: Two times a day (BID) | ORAL | Status: DC
Start: 2021-10-24 — End: 2021-10-25
  Administered 2021-10-24 – 2021-10-25 (×3): 100 mg via ORAL
  Filled 2021-10-24 (×3): qty 1

## 2021-10-24 MED ORDER — BUPIVACAINE HCL (PF) 0.5 % IJ SOLN
INTRAMUSCULAR | Status: DC | PRN
  Administered 2021-10-24: 2.6 mL

## 2021-10-24 MED ORDER — RENA-VITE PO TABS
1.0000 | ORAL_TABLET | Freq: Every day | ORAL | Status: DC
  Administered 2021-10-24 – 2021-10-29 (×6): 1 via ORAL
  Filled 2021-10-24 (×6): qty 1

## 2021-10-24 MED ORDER — SODIUM CHLORIDE 0.9 % IV SOLN
750.0000 mg | Freq: Once | INTRAVENOUS | Status: AC
  Administered 2021-10-24: 750 mg via INTRAVENOUS
  Filled 2021-10-24: qty 12

## 2021-10-24 MED ORDER — CEFAZOLIN SODIUM-DEXTROSE 2-4 GM/100ML-% IV SOLN
2.0000 g | Freq: Four times a day (QID) | INTRAVENOUS | Status: AC
  Administered 2021-10-24 – 2021-10-25 (×3): 2 g via INTRAVENOUS
  Filled 2021-10-24 (×3): qty 100

## 2021-10-24 MED ORDER — PHENYLEPHRINE HCL (PRESSORS) 10 MG/ML IV SOLN
INTRAVENOUS | Status: DC | PRN
  Administered 2021-10-24 (×2): 160 ug via INTRAVENOUS

## 2021-10-24 MED ORDER — TRANEXAMIC ACID 1000 MG/10ML IV SOLN
INTRAVENOUS | Status: AC
  Filled 2021-10-24: qty 10

## 2021-10-24 MED ORDER — MAGNESIUM OXIDE 400 MG PO TABS
400.0000 mg | ORAL_TABLET | Freq: Two times a day (BID) | ORAL | Status: DC
  Administered 2021-10-24 – 2021-10-29 (×11): 400 mg via ORAL
  Filled 2021-10-24 (×21): qty 1

## 2021-10-24 MED ORDER — CEFAZOLIN SODIUM-DEXTROSE 2-4 GM/100ML-% IV SOLN
INTRAVENOUS | Status: AC
  Filled 2021-10-24: qty 100

## 2021-10-24 MED ORDER — VITAMIN D 25 MCG (1000 UNIT) PO TABS
1000.0000 [IU] | ORAL_TABLET | Freq: Every day | ORAL | Status: DC
  Administered 2021-10-24 – 2021-10-29 (×6): 1000 [IU] via ORAL
  Filled 2021-10-24 (×6): qty 1

## 2021-10-24 MED ORDER — PROPOFOL 1000 MG/100ML IV EMUL
INTRAVENOUS | Status: AC
  Filled 2021-10-24: qty 100

## 2021-10-24 MED ORDER — CEFAZOLIN SODIUM-DEXTROSE 2-4 GM/100ML-% IV SOLN
2.0000 g | Freq: Once | INTRAVENOUS | Status: AC
  Administered 2021-10-24: 2 g via INTRAVENOUS

## 2021-10-24 MED ORDER — ACETAMINOPHEN 500 MG PO TABS
1000.0000 mg | ORAL_TABLET | Freq: Four times a day (QID) | ORAL | Status: AC
  Administered 2021-10-24 – 2021-10-25 (×4): 1000 mg via ORAL
  Filled 2021-10-24 (×4): qty 2

## 2021-10-24 MED ORDER — KETOROLAC TROMETHAMINE 15 MG/ML IJ SOLN
INTRAMUSCULAR | Status: AC
  Filled 2021-10-24: qty 1

## 2021-10-24 MED ORDER — KETOROLAC TROMETHAMINE 15 MG/ML IJ SOLN
INTRAMUSCULAR | Status: DC | PRN
  Administered 2021-10-24: 15 mg via INTRA_ARTICULAR

## 2021-10-24 MED ORDER — FENTANYL CITRATE (PF) 100 MCG/2ML IJ SOLN
INTRAMUSCULAR | Status: AC
  Filled 2021-10-24: qty 2

## 2021-10-24 MED ORDER — EPHEDRINE SULFATE (PRESSORS) 50 MG/ML IJ SOLN
INTRAMUSCULAR | Status: DC | PRN
  Administered 2021-10-24: 5 mg via INTRAVENOUS
  Administered 2021-10-24: 10 mg via INTRAVENOUS
  Administered 2021-10-24 (×2): 5 mg via INTRAVENOUS

## 2021-10-24 MED ORDER — BUPIVACAINE-EPINEPHRINE (PF) 0.5% -1:200000 IJ SOLN
INTRAMUSCULAR | Status: AC
  Filled 2021-10-24: qty 30

## 2021-10-24 MED ORDER — APIXABAN 2.5 MG PO TABS
2.5000 mg | ORAL_TABLET | Freq: Two times a day (BID) | ORAL | Status: DC
  Filled 2021-10-24: qty 1

## 2021-10-24 MED ORDER — CHLORHEXIDINE GLUCONATE 0.12 % MT SOLN: 15.0000 mL | Freq: Once | OROMUCOSAL | Status: AC

## 2021-10-24 MED ORDER — CALCIUM CARBONATE 1250 (500 CA) MG PO TABS
1250.0000 mg | ORAL_TABLET | Freq: Every day | ORAL | Status: DC
  Administered 2021-10-24 – 2021-10-29 (×6): 1250 mg via ORAL
  Filled 2021-10-24 (×6): qty 1

## 2021-10-24 MED ORDER — TRANEXAMIC ACID 1000 MG/10ML IV SOLN
INTRAVENOUS | Status: DC | PRN
  Administered 2021-10-24: 1000 mg via TOPICAL

## 2021-10-24 MED ORDER — HYDROMORPHONE HCL 1 MG/ML IJ SOLN
0.2500 mg | INTRAMUSCULAR | Status: DC | PRN
  Administered 2021-10-25 – 2021-10-26 (×3): 0.5 mg via INTRAVENOUS
  Filled 2021-10-24 (×3): qty 1

## 2021-10-24 MED ORDER — CEFAZOLIN SODIUM-DEXTROSE 2-3 GM-%(50ML) IV SOLR: INTRAVENOUS | Status: DC | PRN

## 2021-10-24 SURGICAL SUPPLY — 61 items
ACE SHELL 3H 52 E HIP (Shell) ×2 IMPLANT
BLADE SAGITTAL WIDE XTHICK NO (BLADE) ×2 IMPLANT
BLADE SURG SZ20 CARB STEEL (BLADE) ×2 IMPLANT
CHLORAPREP W/TINT 26 (MISCELLANEOUS) ×2 IMPLANT
DRAPE 3/4 80X56 (DRAPES) ×2 IMPLANT
DRAPE IMP U-DRAPE 54X76 (DRAPES) IMPLANT
DRAPE INCISE IOBAN 66X60 STRL (DRAPES) ×2 IMPLANT
DRAPE SURG 17X11 SM STRL (DRAPES) ×4 IMPLANT
DRSG MEPILEX SACRM 8.7X9.8 (GAUZE/BANDAGES/DRESSINGS) ×2 IMPLANT
DRSG OPSITE POSTOP 4X10 (GAUZE/BANDAGES/DRESSINGS) ×2 IMPLANT
ELECT CAUTERY BLADE 6.4 (BLADE) ×2 IMPLANT
GAUZE 4X4 16PLY ~~LOC~~+RFID DBL (SPONGE) ×2 IMPLANT
GAUZE XEROFORM 1X8 LF (GAUZE/BANDAGES/DRESSINGS) ×2 IMPLANT
GLOVE BIO SURGEON STRL SZ7.5 (GLOVE) ×8 IMPLANT
GLOVE BIO SURGEON STRL SZ8 (GLOVE) ×8 IMPLANT
GLOVE BIOGEL PI IND STRL 8 (GLOVE) ×1 IMPLANT
GLOVE BIOGEL PI INDICATOR 8 (GLOVE) ×2
GLOVE SURG UNDER LTX SZ8 (GLOVE) ×2 IMPLANT
GOWN STRL REUS W/ TWL LRG LVL3 (GOWN DISPOSABLE) ×1 IMPLANT
GOWN STRL REUS W/ TWL XL LVL3 (GOWN DISPOSABLE) ×1 IMPLANT
GOWN STRL REUS W/TWL LRG LVL3 (GOWN DISPOSABLE) ×2
GOWN STRL REUS W/TWL XL LVL3 (GOWN DISPOSABLE) ×2
HEAD CERAMIC BIOLOX 36 T1 +3 (Head) ×1 IMPLANT
HOLSTER ELECTROSUGICAL PENCIL (MISCELLANEOUS) ×1 IMPLANT
HOOD PEEL AWAY FLYTE STAYCOOL (MISCELLANEOUS) ×7 IMPLANT
IV NS IRRIG 3000ML ARTHROMATIC (IV SOLUTION) ×2 IMPLANT
KIT TURNOVER KIT A (KITS) ×2 IMPLANT
LINER ACE G7 36 HIGH WALL (Liner) ×1 IMPLANT
MANIFOLD NEPTUNE II (INSTRUMENTS) ×2 IMPLANT
MAT ABSORB  FLUID 56X50 GRAY (MISCELLANEOUS) ×2
MAT ABSORB FLUID 56X50 GRAY (MISCELLANEOUS) ×1 IMPLANT
NDL FILTER BLUNT 18X1 1/2 (NEEDLE) ×1 IMPLANT
NDL SAFETY ECLIPSE 18X1.5 (NEEDLE) ×2 IMPLANT
NDL SPNL 20GX3.5 QUINCKE YW (NEEDLE) ×1 IMPLANT
NEEDLE FILTER BLUNT 18X 1/2SAF (NEEDLE) ×2
NEEDLE FILTER BLUNT 18X1 1/2 (NEEDLE) ×1 IMPLANT
NEEDLE HYPO 18GX1.5 SHARP (NEEDLE) ×4
NEEDLE SPNL 20GX3.5 QUINCKE YW (NEEDLE) ×2 IMPLANT
PACK HIP PROSTHESIS (MISCELLANEOUS) ×2 IMPLANT
PENCIL SMOKE EVACUATOR (MISCELLANEOUS) ×2 IMPLANT
PIN STEINMAN 3/16 (PIN) ×2 IMPLANT
PIN STEINMANN 3/16X9 BAY 6PK (Pin) ×1 IMPLANT
PLUG APICAL HOLE HIP G7 (Hips) ×1 IMPLANT
PULSAVAC PLUS IRRIG FAN TIP (DISPOSABLE) ×2
SHELL ACETAB 3H 52 E HIP (Shell) IMPLANT
SPONGE T-LAP 18X18 ~~LOC~~+RFID (SPONGE) ×10 IMPLANT
ST PIN 3/16X9 BAY 6PK (Pin) ×2 IMPLANT
STAPLER SKIN PROX 35W (STAPLE) ×2 IMPLANT
STEM COLLARLESS FULL 13X145MM (Stem) ×1 IMPLANT
SUT TICRON 2-0 30IN 311381 (SUTURE) ×6 IMPLANT
SUT VIC AB 0 CT1 36 (SUTURE) ×2 IMPLANT
SUT VIC AB 1 CT1 36 (SUTURE) ×2 IMPLANT
SUT VIC AB 2-0 CT1 (SUTURE) ×7 IMPLANT
SYR 10ML LL (SYRINGE) ×2 IMPLANT
SYR 20ML LL LF (SYRINGE) ×2 IMPLANT
SYR 30ML LL (SYRINGE) ×4 IMPLANT
TAPE TRANSPORE STRL 2 31045 (GAUZE/BANDAGES/DRESSINGS) ×2 IMPLANT
TIP FAN IRRIG PULSAVAC PLUS (DISPOSABLE) ×1 IMPLANT
TRAP FLUID SMOKE EVACUATOR (MISCELLANEOUS) ×2 IMPLANT
WATER STERILE IRR 1000ML POUR (IV SOLUTION) ×2 IMPLANT
WATER STERILE IRR 500ML POUR (IV SOLUTION) ×2 IMPLANT

## 2021-10-24 NOTE — Evaluation (Signed)
Physical Therapy Evaluation Patient Details Name: Adrian Zavala MRN: [id number] DOB: [birth year] Today's Date: 10/24/2021  History of Present Illness  71 y/o male s/p R total hip replacement (posterior approach) 8/1.  Clinical Impression  Pt did well with POD0 PT exam and subsequent PT session.  He was slow and guarded with most acts but showed great effort and was able to do supine exercises, before most bed mobility with just cuing and extra time and ultimately walked ~60 ft with slow but safe and steadily improving gait.  Pt doing well with expected POD0 milestones.       Recommendations for follow up therapy are one component of a multi-disciplinary discharge planning process, led by the attending physician.  Recommendations may be updated based on patient status, additional functional criteria and insurance authorization.  Follow Up Recommendations Follow physician's recommendations for discharge plan and follow up therapies      Assistance Recommended at Discharge Intermittent Supervision/Assistance  Patient can return home with the following  A little help with bathing/dressing/bathroom;Assistance with cooking/housework;Assist for transportation    Equipment Recommendations BSC/3in1  Recommendations for Other Services       Functional Status Assessment Patient has had a recent decline in their functional status and demonstrates the ability to make significant improvements in function in a reasonable and predictable amount of time.     Precautions / Restrictions Precautions Precautions: Posterior Hip;Fall Precaution Booklet Issued: Yes (comment) Restrictions Weight Bearing Restrictions: Yes RLE Weight Bearing: Weight bearing as tolerated      Mobility  Bed Mobility Overal bed mobility: Needs Assistance Bed Mobility: Supine to Sit     Supine to sit: Min guard, Min assist     General bed mobility comments: slow and labored getting to EOB, with use of bed rails did not  need direct assist to attain EOB sitting    Transfers Overall transfer level: Needs assistance Equipment used: Rolling walker (2 wheels) Transfers: Sit to/from Stand Sit to Stand: Min guard           General transfer comment: from standard height bed, pt able to rise with heavy UE use and cuing to insure he did not bend too forward at the hip    Ambulation/Gait Ambulation/Gait assistance: Min guard Gait Distance (Feet): 60 Feet Assistive device: Rolling walker (2 wheels)         General Gait Details: Slow but steadily improving cadence/step length/speed with appropriate UE reliance  Stairs            Wheelchair Mobility    Modified Rankin (Stroke Patients Only)       Balance Overall balance assessment: Modified Independent                                           Pertinent Vitals/Pain Pain Assessment Pain Assessment: 0-10 Pain Score: 2  (2/10 at rest, signficant increase with all R hip activity)    Home Living Family/patient expects to be discharged to:: Private residence Living Arrangements: Spouse/significant other     Home Access: Stairs to enter Entrance Stairs-Rails:  (yes) Entrance Stairs-Number of Steps: 3   Home Layout: One level Home Equipment: Conservation officer, nature (2 wheels)      Prior Function Prior Level of Function : Working/employed             Mobility Comments: over the last month he has had so much pain  he was using a w/c ADLs Comments: recent hip pain has necessitated wife was helping him don shoes/socks     Hand Dominance        Extremity/Trunk Assessment   Upper Extremity Assessment Upper Extremity Assessment: Overall WFL for tasks assessed    Lower Extremity Assessment Lower Extremity Assessment:  (expected post-op weakness)       Communication   Communication: No difficulties  Cognition Arousal/Alertness: Awake/alert Behavior During Therapy: WFL for tasks assessed/performed Overall  Cognitive Status: Within Functional Limits for tasks assessed                                          General Comments General comments (skin integrity, edema, etc.): good effort POD0    Exercises Total Joint Exercises Ankle Circles/Pumps: AROM, 10 reps Quad Sets: Strengthening, 10 reps Short Arc Quad: AROM, Strengthening, 10 reps Heel Slides: AROM, AAROM, 5 reps (lightly resisted leg ext) Hip ABduction/ADduction: AROM, AAROM, 5 reps   Assessment/Plan    PT Assessment Patient needs continued PT services  PT Problem List Decreased strength;Decreased range of motion;Decreased activity tolerance;Decreased balance;Decreased mobility;Decreased coordination;Decreased knowledge of use of DME;Decreased safety awareness;Pain       PT Treatment Interventions DME instruction;Gait training;Stair training;Functional mobility training;Therapeutic activities;Therapeutic exercise;Balance training;Neuromuscular re-education;Patient/family education    PT Goals (Current goals can be found in the Care Plan section)  Acute Rehab PT Goals Patient Stated Goal: go home tomorrow PT Goal Formulation: With patient Time For Goal Achievement: 11/06/21 Potential to Achieve Goals: Good    Frequency BID     Co-evaluation               AM-PAC PT "6 Clicks" Mobility  Outcome Measure Help needed turning from your back to your side while in a flat bed without using bedrails?: A Little Help needed moving from lying on your back to sitting on the side of a flat bed without using bedrails?: A Little Help needed moving to and from a bed to a chair (including a wheelchair)?: A Little Help needed standing up from a chair using your arms (e.g., wheelchair or bedside chair)?: A Little Help needed to walk in hospital room?: A Little Help needed climbing 3-5 steps with a railing? : A Little 6 Click Score: 18    End of Session Equipment Utilized During Treatment: Gait belt Activity  Tolerance: Patient tolerated treatment well;Patient limited by pain Patient left: with chair alarm set;with call bell/phone within reach;with family/visitor present;with nursing/sitter in room Nurse Communication: Mobility status PT Visit Diagnosis: Muscle weakness (generalized) (M62.81);Difficulty in walking, not elsewhere classified (R26.2);Pain Pain - Right/Left: Right Pain - part of body: Hip    Time: 8768-1157 PT Time Calculation (min) (ACUTE ONLY): 59 min   Charges:   PT Evaluation $PT Eval Low Complexity: 1 Low PT Treatments $Gait Training: 8-22 mins $Therapeutic Exercise: 8-22 mins $Therapeutic Activity: 8-22 mins        Kreg Shropshire, DPT 10/24/2021, 6:09 PM

## 2021-10-24 NOTE — Plan of Care (Signed)
  Problem: Activity: Goal: Risk for activity intolerance will decrease Outcome: Progressing   Problem: Nutrition: Goal: Adequate nutrition will be maintained Outcome: Progressing   Problem: Pain Managment: Goal: General experience of comfort will improve Outcome: Progressing   

## 2021-10-24 NOTE — Transfer of Care (Signed)
Immediate Anesthesia Transfer of Care Note  Patient: Adrian Zavala  Procedure(s) Performed: TOTAL HIP ARTHROPLASTY (Right: Hip)  Patient Location: PACU  Anesthesia Type:Spinal  Level of Consciousness: drowsy and patient cooperative  Airway & Oxygen Therapy: Patient Spontanous Breathing and Patient connected to face mask oxygen  Post-op Assessment: Report given to RN and Post -op Vital signs reviewed and stable  Post vital signs: Reviewed and stable  Last Vitals:  Vitals Value Taken Time  BP 102/57 10/24/21 1248  Temp    Pulse 79 10/24/21 1248  Resp 15 10/24/21 1248  SpO2 100 % 10/24/21 1248    Last Pain:  Vitals:   10/24/21 0906  TempSrc: Temporal  PainSc: 5          Complications: No notable events documented.

## 2021-10-24 NOTE — Op Note (Signed)
10/24/2021  12:37 PM  Patient:   Adrian Zavala  Pre-Op Diagnosis:   Degenerative joint disease, right hip.  Post-Op Diagnosis:   Same.  Procedure:   Right total hip arthroplasty.  Surgeon:   Pascal Lux, MD  Assistant:   Cameron Proud, PA-C; Almon Register, PA-S  Anesthesia:   Spinal  Findings:   As above.  Complications:   None  EBL:   75 cc  Fluids:   800 cc crystalloid  UOP:   None  TT:   None  Drains:   None  Closure:   Staples  Implants:   Biomet press-fit system with a #13 lateral offset Echo femoral stem, a 52 mm acetabular shell with an E-poly hi-wall liner, and a 36 mm ceramic head with a +3 mm neck.  Brief Clinical Note:   The patient is a 71 year old male with a history of progressively worsening right hip pain.  His symptoms have progressed despite medications, activity modification, etc.  His history and examination are consistent with advanced degenerative joint disease, confirmed by MRI scanning.  The MRI scan also demonstrated evidence of increased bone marrow edema in both the acetabulum and femoral heads with a possible insufficiency fracture of the femoral head.  The patient presents at this time for a right total hip arthroplasty.   Procedure:   The patient was brought into the operating room. After adequate spinal anesthesia was obtained, the patient was repositioned in the left lateral decubitus position and secured using a lateral hip positioner. The right hip and lower extremity were prepped with ChloroPrep solution before being draped sterilely. Preoperative antibiotics were administered. A timeout was performed to verify the appropriate surgical site.    A standard posterior approach to the hip was made through an approximately 6-7 inch incision. The incision was carried down through the subcutaneous tissues to expose the gluteal fascia and proximal end of the iliotibial band. These structures were split the length of the incision and the Charnley  self-retaining hip retractor placed. The bursal tissues were swept posteriorly to expose the short external rotators. The anterior border of the piriformis tendon was identified and this plane developed down through the capsule to enter the joint. A flap of tissue was elevated off the posterior aspect of the femoral neck and greater trochanter and retracted posteriorly. This flap included the piriformis tendon, the short external rotators, and the posterior capsule. The soft tissues were elevated off the lateral aspect of the ilium and a large Steinmann pin placed bicortically.   With the right leg aligned over the left, a drill bit was placed into the greater trochanter parallel to the Steinmann pin and the distance between these two pins measured in order to optimize leg lengths postoperatively. The drill bit was removed and the hip dislocated. The piriformis fossa was debrided of soft tissues before the intramedullary canal was accessed through this point using a triple step reamer. The canal was reamed sequentially beginning with a #7 tapered reamer and progressing to a #13 tapered reamer. This provided excellent circumferential chatter. Using the appropriate guide, a femoral neck cut was made 10-12 mm above the lesser trochanter. The femoral head was removed.  Attention was directed to the acetabular side. The labrum was debrided circumferentially before the ligamentum teres was removed using a large curette. A line was drawn on the drapes corresponding to the native version of the acetabulum. This line was used as a guide while the acetabulum was reamed sequentially beginning with a  47 mm reamer and progressing to a 51 mm reamer. This provided excellent circumferential chatter. The 51 mm trial acetabulum was positioned and found to fit quite well. Therefore, the 52 mm acetabular shell was selected and impacted into place with care taken to maintain the appropriate version. The trial high wall liner was  inserted.  Attention was redirected to the femoral side. A box osteotome was used to establish version before the canal was broached sequentially beginning with a #7 broach and progressing to a #13 broach. This was left in place and several trial reductions performed using both a standard and laterally offset neck options, as well as the -3 mm, +0 mm, and +3 mm neck lengths. After removing the trial components, the "manhole cover" was placed into the apex of the acetabular shell and tightened securely. The permanent E-polyethylene hi-wall liner was impacted into the acetabular shell and its locking mechanism verified using a quarter-inch osteotome. Next, the #13 laterally offset femoral stem was impacted into place with care taken to maintain the appropriate version. A repeat trial reduction was performed using the +0 mm and +3 mm neck lengths. The +3 mm neck length demonstrated excellent stability both in extension and external rotation as well as with flexion to 90 and internal rotation beyond 70. It also was stable in the position of sleep. In addition, leg lengths appeared to be restored appropriately, both by reassessing the position of the right leg over the left, as well as by measuring the distance between the Steinmann pin and the drill bit. The 36 mm ceramic head with the +3 mm neck was impacted onto the stem of the femoral component. The Morse taper locking mechanism was verified using manual distraction before the head was relocated and placed through a range of motion with the findings as described above.  The wound was copiously irrigated with sterile saline solution via the jet lavage system before the peri-incisional and pericapsular tissues were injected with 30 cc of 0.5% Sensorcaine with epinephrine and 20 cc of Exparel diluted out to 60 cc with normal saline to help with postoperative analgesia. The posterior flap was reapproximated to the posterior aspect of the greater trochanter using #2  Tycron interrupted sutures placed through drill holes. Several additional #2 Tycron interrupted sutures were used to reinforce this layer of closure. The iliotibial band was reapproximated using #1 Vicryl interrupted sutures before the gluteal fascia was closed using a running #1 Vicryl suture. At this point, 1 g of transexemic acid in 10 cc of normal saline was injected into the joint to help reduce postoperative bleeding. The subcutaneous tissues were closed in several layers using 2-0 Vicryl interrupted sutures before the skin was closed using staples. A sterile occlusive dressing was applied to the wound. The patient was then rolled back into the supine position on his/her hospital bed before being awakened and returned to the recovery room in satisfactory condition after tolerating the procedure well.

## 2021-10-24 NOTE — TOC Progression Note (Signed)
Transition of Care [facility]) - Progression Note    Patient Details  Name: Adrian Zavala MRN: [id number] Date of Birth: Jul 11, 1950  Transition of Care [facility] CM/SW [place], RN Phone Number: 10/24/2021, 11:55 AM  Clinical Narrative:   Patient scheduled to go to Outpatient PT 10/26/21, has a rolling walker at home, 3 in 1 was to be delivered by [employer] to his home prior to surgery         Expected Discharge Plan and Services                                                 Social Determinants of Health (SDOH) Interventions    Readmission Risk Interventions     No data to display

## 2021-10-24 NOTE — Anesthesia Preprocedure Evaluation (Signed)
Anesthesia Evaluation  Patient identified by MRN, date of birth, ID band Patient awake    Reviewed: Allergy & Precautions, H&P , NPO status , Patient's Chart, lab work & pertinent test results, reviewed documented beta blocker date and time   Airway Mallampati: II   Neck ROM: full    Dental  (+) Poor Dentition   Pulmonary neg pulmonary ROS, former smoker,    Pulmonary exam normal        Cardiovascular Exercise Tolerance: Poor hypertension, On Medications + CAD  negative cardio ROS Normal cardiovascular exam+ dysrhythmias + Valvular Problems/Murmurs  Rhythm:regular Rate:Normal     Neuro/Psych negative neurological ROS  negative psych ROS   GI/Hepatic negative GI ROS, Neg liver ROS, GERD  ,  Endo/Other  negative endocrine ROSHypothyroidism   Renal/GU negative Renal ROS  negative genitourinary   Musculoskeletal   Abdominal   Peds  Hematology negative hematology ROS (+) Blood dyscrasia, anemia ,   Anesthesia Other Findings Past Medical History: No date: Anemia No date: Aortic atherosclerosis ([place]) 06/10/2017: Aortic stenosis     Comment:  a.) TTE 06/10/2017: EF 60-65%; very mild AS with MPG 5               mmHG; AVA (VTI) = 2.22 cm No date: Arthritis     Comment:  Spine, Left Hip, Hands 05/07/2016: Ascending aorta dilatation (HCC)     Comment:  a.) cCTA 05/07/2016: measured 41 mm; b.) TTE 06/10/2017:              measured 39 mm; c.) cCTA 08/04/2020: measured 39 mm; d.)               TTE 08/15/2020: measured 38 mm No date: BPH (benign prostatic hyperplasia) No date: Coronary artery disease No date: Crohn's disease ([place]) No date: 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[place])     Comment:  a.) exogenous 2/2 prolonged corticosteroid use 70/96/2836: Diastolic dysfunction     Comment:  a.) TTE 08/15/2020: EF 60-65%, mild MR, mod AoV               sclerosis, G1DD No date: Elevated hemoglobin A1c No date: Fatty liver disease,  nonalcoholic No date: GERD (gastroesophageal reflux disease) No date: 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HCC) No date: Heart murmur No date: Hemorrhoid No date: Hypercholesteremia No date: Hypertension No date: Hypothyroidism No date: IBS (irritable bowel syndrome) No date: Inflammatory eye disease No date: Long term current use of systemic steroids     Comment:  a.) low dose prednisone No date: Long-term current use of bisphosphonate     Comment:  a.) ibandronate No date: Lower extremity weakness     Comment:  a.) s/p chemo for Wegener's No date: Lumbar stenosis No date: Lung disorder     Comment:  S/P chemo for Wegener's No date: Osteopenia No date: Osteoporosis No date: Pre-diabetes No date: RBBB (right bundle branch block) No date: Seasonal allergies 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     Comment:  a.) s/p Tx with cyclophosphamide + MTX; ongoing               treatment with chronic corticosteroids Past Surgical History: No date: CHOLECYSTECTOMY     Comment:  has "metal sutures"  10/25/2014: COLONOSCOPY; N/A     Comment:  Procedure: COLONOSCOPY;  Surgeon: Lucilla Lame, MD;                Location: [place];  Service:  Gastroenterology;  Laterality: N/A; No date: TREATMENT FISTULA ANAL     Comment:  closure with rectal advancement flap No date: VASECTOMY BMI    Body Mass Index: 31.24 kg/m     Reproductive/Obstetrics negative OB ROS                             Anesthesia Physical Anesthesia Plan  ASA: 4  Anesthesia Plan: Spinal   Post-op Pain Management:    Induction:   PONV Risk Score and Plan: 2  Airway Management Planned:   Additional Equipment:   Intra-op Plan:   Post-operative Plan:   Informed Consent: I have reviewed the patients History and Physical, chart, labs and discussed the procedure including the risks, benefits and alternatives for the proposed anesthesia with the patient or authorized  representative who has indicated his/her understanding and acceptance.     Dental Advisory Given  Plan Discussed with: CRNA  Anesthesia Plan Comments:         Anesthesia Quick Evaluation

## 2021-10-24 NOTE — Anesthesia Procedure Notes (Addendum)
Spinal  Patient location during procedure: OR Start time: 10/24/2021 10:34 AM End time: 10/24/2021 10:37 AM Reason for block: surgical anesthesia Staffing Performed: resident/CRNA and other anesthesia staff  Anesthesiologist: Molli Barrows, MD Resident/CRNA: Loletha Grayer, CRNA Other anesthesia staff: Terrence Dupont, RN Performed by: Terrence Dupont, RN Authorized by: Molli Barrows, MD   Preanesthetic Checklist Completed: patient identified, IV checked, site marked, risks and benefits discussed, surgical consent, monitors and equipment checked, pre-op evaluation and timeout performed Spinal Block Patient position: sitting Prep: Betadine Patient monitoring: heart rate, continuous pulse ox, blood pressure and cardiac monitor Approach: midline Location: L4-5 Injection technique: single-shot Needle Needle type: [place]  Needle gauge: 22 G Needle length: 9 cm Assessment Sensory level: T10 Events: CSF return Additional Notes Negative paresthesia. Negative blood return. Positive free-flowing CSF. Expiration date of kit checked and confirmed. Patient tolerated procedure well, without complications.

## 2021-10-24 NOTE — H&P (Signed)
History of Present Illness: Adrian Zavala is a 71 y.o. male who presents for evaluation and treatment of his right hip pain with radiation to anterior right thigh . The symptoms have been present for 8 months and developed following a slip and fall injury. Initially, the patient saw Dr. Alba Destine whom he had been seeing for chronic mid back pain. She ordered x-rays which showed no evidence for fractures, so the patient was started in physical therapy which provided little if any improvement in his symptoms. Therefore, the patient underwent a steroid injection into the right hip by Dr. Alba Destine which provided only temporary partial relief of his symptoms. Therefore, the patient was sent to orthopedics where he saw Dr. Candelaria Stagers. Dr. Orion Modest felt that the majority of his pain was actually coming from his hip so he was sent for an MRI scan and referred to me for further evaluation and treatment. The pain is moderate-severe and constant. He rates his pain at 4/10 on today's visit. He describes the pain as aching, dull, stabbing, and throbbing. The symptoms are aggravated by standing, walking, lifting, carrying heavy loads, daily activities, and at night. The symptoms improve with sitting, lying down, and rest. He has been taking ibuprofen, muscle relaxer, and tramadol as necessary with limited benefit. He denies any numbness, weakness, paresthesias to either lower extremity. He reports no bladder, bowel or sexual dysfunction. He has tried steroid injections and physical therapy in the past as noted above. He has not had any prior injury or symptoms to this part of the body in the past. 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  Current Outpatient Medications: amLODIPine (NORVASC) 5 MG tablet  aspirin 81 MG EC tablet Take 1 tablet (81 mg total) by mouth once daily 2 tablets QD   calcium carbonate-vitamin D3 (OS-CAL 500+D) 500 mg(1,267m) -200 unit tablet Take 1 tablet by mouth once daily  cholecalciferol (VITAMIN D3) 2,000 unit capsule Take 1 capsule (2,000 Units total) by mouth once daily  dimethicone 2 % Crea Apply topically.  ezetimibe (ZETIA) 10 mg tablet Take 1 tablet (10 mg total) by mouth once daily  famotidine (PEPCID) 40 MG tablet Take by mouth  fluticasone propionate (FLONASE) 50 mcg/actuation nasal spray Place 2 sprays into both nostrils once daily  folic acid (FOLVITE) 1 MG tablet Take 1 tablet (1 mg total) by mouth once daily  gabapentin (NEURONTIN) 300 MG capsule Take 1 capsule (300 mg total) by mouth 3 (three) times daily 270 capsule 1  hydrocortisone 1 % cream Apply topically 2 (two) times daily  ibandronate (BONIVA) 150 mg tablet TAKE 1 TABLET BY MOUTH EVERY 30 DAYS TAKE WITH A FULL GLASS OF WATER. DO NOT LIE DOWN FOR THE NEXT 60 MINUTES 3 tablet 3  ibuprofen (MOTRIN) 200 MG tablet Take 1 tablet (200 mg total) by mouth every 6 (six) hours as needed for Pain  levothyroxine (SYNTHROID, LEVOTHROID) 50 MCG tablet Take 1 tablet (50 mcg total) by mouth once daily Take on an empty stomach with a glass of water at least 30-60 minutes before breakfast.  loratadine (CLARITIN) 10 mg capsule Take 1 capsule (10 mg total) by mouth once daily  magnesium oxide (MAG-OX) 400 mg tablet Take 1 tablet (400 mg total) by mouth 2 (two) times daily. 60 tablet 5  niacin (NIASPAN) 500 MG ER tablet Take 1 tablet (500 mg total) by mouth at  bedtime  predniSONE (DELTASONE) 5 MG tablet Take 1 tablet (5 mg total) by mouth once daily 90 tablet 1  rosuvastatin (CRESTOR) 20 MG tablet Take 1 tablet (20 mg total) by mouth once daily  tiZANidine (ZANAFLEX) 4 MG tablet Take 0.5 tablets (2 mg total) by mouth 3 (three) times daily As needed for muscle spasm 30 tablet 1  traMADol (ULTRAM) 50 mg tablet Take 1 tablet (50 mg total) by mouth 4 (four) times daily as needed Takes this twice daily.   triamcinolone 0.5 % cream Apply topically 2 (two) times daily   Allergies:   Bactrim [Sulfamethoxazole-Trimethoprim] Anaphylaxis  Codeine Rash and Tingling  Penicillins Other (See Comments)  Tetracyclines Rash  Prilosec [Omeprazole] Unknown   Past Medical History:  Allergic state  Colitis, unspecified  Crohn's disease (CMS-HCC)  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CMS-HCC)  GERD (gastroesophageal reflux disease)  Heart murmur, unspecified  Hyperlipidemia  Hypertension  Inflammatory eye disease  Osteoarthritis  Osteoporosis (Dexa studies on file)  Thyroid disease  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CMS-HCC)   Past Surgical History:  CHOLECYSTECTOMY  FISSURECTOMY ANAL  FUNCTIONAL ENDOSCOPIC SINUS SURGERY  VASECTOMY   Family History:  Arthritis Mother  Coronary Artery Disease (Blocked arteries around heart) Mother  Myocardial Infarction (Heart attack) Mother  Hyperlipidemia (Elevated cholesterol) Mother  High blood pressure (Hypertension) Mother  Cancer Maternal Aunt   Social History:   Socioeconomic History:  Marital status: Married  Occupational History  Occupation: works full-time  Tobacco Use  Smoking status: Former  Packs/day: 1.00  Years: 30.00  Pack years: 30.00  Types: Cigarettes  Start date: 11/03/1967  Quit date: 11/12/1997  Years since quitting: 23.9  Smokeless tobacco: Never  Tobacco comments:  Stopped smoking in 1999  Vaping Use  Vaping Use: Never used  Substance and Sexual Activity  Alcohol use: Yes  Comment: 1 to 3 beers per week  Drug use: No  Sexual activity: Not Currently  Partners: Female  Birth control/protection: Surgical  Comment: Vasectomy   Review of Systems:  A comprehensive 14 point ROS was performed, reviewed, and the pertinent orthopaedic findings are documented in the HPI.  Physical Exam: Vitals:  10/13/21 1320  BP: (!) 146/92  Weight: 83.9 kg (185 lb)  Height: 165.1 cm (5' 5" )  PainSc: 4  PainLoc: Hip   General/Constitutional: The patient  appears to be well-nourished, well-developed, and in no acute distress. Neuro/Psych: Normal mood and affect, oriented to person, place and time. Eyes: Non-icteric. Pupils are equal, round, and reactive to light, and exhibit synchronous movement. ENT: Unremarkable. Lymphatic: No palpable adenopathy. Respiratory: Lungs clear to auscultation, Normal chest excursion, No wheezes, and Non-labored breathing Cardiovascular: Regular rate and rhythm. No murmurs. and No edema, swelling or tenderness, except as noted in detailed exam. Integumentary: No impressive skin lesions present, except as noted in detailed exam. Musculoskeletal: Unremarkable, except as noted in detailed exam.  Lumbar exam: Skin inspection of the lower back is unremarkable. His spine appears to be straight. His gait is not assessed on today's visit as he presents in a wheelchair. He has mild tenderness along the thoracolumbar junction. He has no pain over either SI joint or either trochanteric region. .   Hip exam: He has significant pain with right hip internal rotation and external rotation. He exhibits flexion to 95 degrees, internal rotation to 10 degrees, and external rotation to 35 degrees on the right. He is neurovascularly intact to the right lower extremity, other than slightly decreased sensation to light touch to the anterior right thigh region as  compared to the left. He has negative sitting straight leg raises bilaterally.  Imaging:  Recent x-rays of the pelvis and right hip are available for review and have been reviewed by myself. These films demonstrate no evidence for fractures, lytic lesions, or significant degenerative changes, although early degenerative changes are noted as manifest by small osteophytes on the inferior aspect of the femoral neck and in the fovea.  A recent MRI scan of the right hip and also is available for review and has been reviewed by myself. By report, this study demonstrates evidence of  moderate to severe degenerative joint disease as manifest by "full-thickness cartilage loss along the weightbearing surfaces" as well as a "moderate-sized hip effusion with synovitis." The MRI scan also demonstrates evidence of "periarticular marrow edema in the right femoral head/neck and acetabulum related to arthritis. There is a potential subchondral insufficiency fracture of the right femoral head." Both the films and report were reviewed by myself and discussed with the patient and his wife.  Assessment: Primary osteoarthritis of right hip.   Plan: The treatment options were discussed with the patient and his wife. In addition, patient educational materials were provided regarding the diagnosis and treatment options. The patient is quite frustrated by his symptoms and functional limitations, and is ready to consider more aggressive treatment options. Therefore, I have recommended a surgical procedure, specifically a right total hip arthroplasty. The procedure was discussed with the patient, as were the potential risks (including bleeding, infection, nerve and/or blood vessel injury, persistent or recurrent pain, loosening and/or failure of the components, dislocation, leg length inequality, need for further surgery, blood clots, strokes, heart attacks and/or arhythmias, pneumonia, etc.) and benefits. The patient states his/her understanding and wishes to proceed. All of the patient's questions and concerns were answered. He can call any time with further concerns. He will follow up post-surgery, routine.   H&P reviewed and patient re-examined. No changes.

## 2021-10-25 ENCOUNTER — Inpatient Hospital Stay: Payer: Managed Care, Other (non HMO)

## 2021-10-25 ENCOUNTER — Encounter
Admission: RE | Disposition: A | Discharge status: Home / Self Care | Payer: Self-pay | Source: Home / Self Care | Attending: Surgery

## 2021-10-25 ENCOUNTER — Other Ambulatory Visit: Payer: Self-pay

## 2021-10-25 ENCOUNTER — Inpatient Hospital Stay: Payer: Managed Care, Other (non HMO) | Admitting: Anesthesiology

## 2021-10-25 ENCOUNTER — Observation Stay: Payer: Managed Care, Other (non HMO)

## 2021-10-25 ENCOUNTER — Encounter: Payer: Self-pay | Admitting: Surgery

## 2021-10-25 DIAGNOSIS — E249 Cushing's syndrome, unspecified: Secondary | ICD-10-CM | POA: Diagnosis present

## 2021-10-25 DIAGNOSIS — E669 Obesity, unspecified: Secondary | ICD-10-CM | POA: Diagnosis present

## 2021-10-25 DIAGNOSIS — K760 Fatty (change of) liver, not elsewhere classified: Secondary | ICD-10-CM | POA: Diagnosis present

## 2021-10-25 DIAGNOSIS — M9701XA Periprosthetic fracture around internal prosthetic right hip joint, initial encounter: Secondary | ICD-10-CM | POA: Diagnosis not present

## 2021-10-25 DIAGNOSIS — Z79891 Long term (current) use of opiate analgesic: Secondary | ICD-10-CM | POA: Diagnosis not present

## 2021-10-25 DIAGNOSIS — M810 Age-related osteoporosis without current pathological fracture: Secondary | ICD-10-CM | POA: Diagnosis present

## 2021-10-25 DIAGNOSIS — M1611 Unilateral primary osteoarthritis, right hip: Secondary | ICD-10-CM | POA: Diagnosis present

## 2021-10-25 DIAGNOSIS — I10 Essential (primary) hypertension: Secondary | ICD-10-CM | POA: Diagnosis present

## 2021-10-25 DIAGNOSIS — Z79899 Other long term (current) drug therapy: Secondary | ICD-10-CM | POA: Diagnosis not present

## 2021-10-25 DIAGNOSIS — I2510 Atherosclerotic heart disease of native coronary artery without angina pectoris: Secondary | ICD-10-CM | POA: Diagnosis present

## 2021-10-25 DIAGNOSIS — Y92231 Patient bathroom in hospital as the place of occurrence of the external cause: Secondary | ICD-10-CM | POA: Diagnosis not present

## 2021-10-25 DIAGNOSIS — Z881 Allergy status to other antibiotic agents status: Secondary | ICD-10-CM | POA: Diagnosis not present

## 2021-10-25 DIAGNOSIS — W01198A Fall on same level from slipping, tripping and stumbling with subsequent striking against other object, initial encounter: Secondary | ICD-10-CM | POA: Diagnosis not present

## 2021-10-25 DIAGNOSIS — I4510 Unspecified right bundle-branch block: Secondary | ICD-10-CM | POA: Diagnosis present

## 2021-10-25 DIAGNOSIS — K5090 Crohn's disease, unspecified, without complications: Secondary | ICD-10-CM | POA: Diagnosis present

## 2021-10-25 DIAGNOSIS — E039 Hypothyroidism, unspecified: Secondary | ICD-10-CM | POA: Diagnosis present

## 2021-10-25 DIAGNOSIS — I700 Atherosclerosis of aorta: Secondary | ICD-10-CM | POA: Diagnosis present

## 2021-10-25 DIAGNOSIS — S72121A Displaced fracture of lesser trochanter of right femur, initial encounter for closed fracture: Secondary | ICD-10-CM | POA: Diagnosis not present

## 2021-10-25 DIAGNOSIS — J302 Other seasonal allergic rhinitis: Secondary | ICD-10-CM | POA: Diagnosis present

## 2021-10-25 DIAGNOSIS — R7303 Prediabetes: Secondary | ICD-10-CM | POA: Diagnosis present

## 2021-10-25 DIAGNOSIS — K219 Gastro-esophageal reflux disease without esophagitis: Secondary | ICD-10-CM | POA: Diagnosis present

## 2021-10-25 DIAGNOSIS — M3130 Wegener's granulomatosis without renal involvement: Secondary | ICD-10-CM | POA: Diagnosis present

## 2021-10-25 DIAGNOSIS — Z7982 Long term (current) use of aspirin: Secondary | ICD-10-CM | POA: Diagnosis not present

## 2021-10-25 DIAGNOSIS — Z79890 Hormone replacement therapy (postmenopausal): Secondary | ICD-10-CM | POA: Diagnosis not present

## 2021-10-25 DIAGNOSIS — I77810 Thoracic aortic ectasia: Secondary | ICD-10-CM | POA: Diagnosis present

## 2021-10-25 DIAGNOSIS — N400 Enlarged prostate without lower urinary tract symptoms: Secondary | ICD-10-CM | POA: Diagnosis present

## 2021-10-25 HISTORY — PX: FEMORAL REVISION: SHX5830

## 2021-10-25 LAB — CBC
HCT: 36 % — ABNORMAL LOW (ref 39.0–52.0)
Hemoglobin: 12.3 g/dL — ABNORMAL LOW (ref 13.0–17.0)
MCH: 32.3 pg (ref 26.0–34.0)
MCHC: 34.2 g/dL (ref 30.0–36.0)
MCV: 94.5 fL (ref 80.0–100.0)
Platelets: 174 10*3/uL (ref 150–400)
RBC: 3.81 MIL/uL — ABNORMAL LOW (ref 4.22–5.81)
RDW: 12.7 % (ref 11.5–15.5)
WBC: 12.4 10*3/uL — ABNORMAL HIGH (ref 4.0–10.5)
nRBC: 0 % (ref 0.0–0.2)

## 2021-10-25 LAB — BASIC METABOLIC PANEL
Anion gap: 9 (ref 5–15)
BUN: 20 mg/dL (ref 8–23)
CO2: 24 mmol/L (ref 22–32)
Calcium: 9.8 mg/dL (ref 8.9–10.3)
Chloride: 108 mmol/L (ref 98–111)
Creatinine, Ser: 1.2 mg/dL (ref 0.61–1.24)
GFR, Estimated: >60 mL/min (ref >60)
Glucose, Bld: 141 mg/dL — ABNORMAL HIGH (ref 70–99)
Potassium: 3.9 mmol/L (ref 3.5–5.1)
Sodium: 141 mmol/L (ref 135–145)

## 2021-10-25 SURGERY — FEMORAL REVISION
Anesthesia: Spinal | Site: Hip | Laterality: Right

## 2021-10-25 MED ORDER — APIXABAN 2.5 MG PO TABS: 2.5000 mg | ORAL_TABLET | Freq: Two times a day (BID) | ORAL | 0 refills | Status: DC

## 2021-10-25 MED ORDER — KETOROLAC TROMETHAMINE 15 MG/ML IJ SOLN
7.5000 mg | Freq: Four times a day (QID) | INTRAMUSCULAR | Status: AC
  Administered 2021-10-26 (×3): 7.5 mg via INTRAVENOUS
  Filled 2021-10-25 (×4): qty 1

## 2021-10-25 MED ORDER — LIDOCAINE HCL (CARDIAC) PF 100 MG/5ML IV SOSY
PREFILLED_SYRINGE | INTRAVENOUS | Status: DC | PRN
  Administered 2021-10-25: 60 mg via INTRAVENOUS

## 2021-10-25 MED ORDER — 0.9 % SODIUM CHLORIDE (POUR BTL) OPTIME
TOPICAL | Status: DC | PRN
  Administered 2021-10-25: 250 mL

## 2021-10-25 MED ORDER — PHENYLEPHRINE 80 MCG/ML (10ML) SYRINGE FOR IV PUSH (FOR BLOOD PRESSURE SUPPORT)
PREFILLED_SYRINGE | INTRAVENOUS | Status: DC | PRN
  Administered 2021-10-25: 160 ug via INTRAVENOUS

## 2021-10-25 MED ORDER — SODIUM CHLORIDE 0.9 % IV SOLN: INTRAVENOUS | Status: DC | PRN

## 2021-10-25 MED ORDER — FENTANYL CITRATE (PF) 100 MCG/2ML IJ SOLN
INTRAMUSCULAR | Status: DC | PRN
  Administered 2021-10-25 (×2): 50 ug via INTRAVENOUS

## 2021-10-25 MED ORDER — FENTANYL CITRATE (PF) 100 MCG/2ML IJ SOLN: 25.0000 ug | INTRAMUSCULAR | Status: DC | PRN

## 2021-10-25 MED ORDER — CEFAZOLIN SODIUM-DEXTROSE 2-4 GM/100ML-% IV SOLN
INTRAVENOUS | Status: AC
  Administered 2021-10-26: 2 g via INTRAVENOUS
  Filled 2021-10-25: qty 100

## 2021-10-25 MED ORDER — KETAMINE HCL 50 MG/5ML IJ SOSY
PREFILLED_SYRINGE | INTRAMUSCULAR | Status: AC
  Filled 2021-10-25: qty 5

## 2021-10-25 MED ORDER — FLEET ENEMA 7-19 GM/118ML RE ENEM: 1.0000 | ENEMA | Freq: Once | RECTAL | Status: DC | PRN

## 2021-10-25 MED ORDER — MIDAZOLAM HCL 5 MG/5ML IJ SOLN
INTRAMUSCULAR | Status: DC | PRN
  Administered 2021-10-25 (×2): 1 mg via INTRAVENOUS

## 2021-10-25 MED ORDER — SODIUM CHLORIDE FLUSH 0.9 % IV SOLN
INTRAVENOUS | Status: AC
  Filled 2021-10-25: qty 40

## 2021-10-25 MED ORDER — APIXABAN 2.5 MG PO TABS
2.5000 mg | ORAL_TABLET | Freq: Two times a day (BID) | ORAL | Status: DC
  Administered 2021-10-26 – 2021-10-29 (×7): 2.5 mg via ORAL
  Filled 2021-10-25 (×7): qty 1

## 2021-10-25 MED ORDER — SODIUM CHLORIDE 0.9 % IR SOLN
Status: DC | PRN
  Administered 2021-10-25: 3000 mL

## 2021-10-25 MED ORDER — OXYCODONE HCL 5 MG/5ML PO SOLN: 5.0000 mg | Freq: Once | ORAL | Status: DC | PRN

## 2021-10-25 MED ORDER — FENTANYL CITRATE (PF) 100 MCG/2ML IJ SOLN
INTRAMUSCULAR | Status: AC
  Filled 2021-10-25: qty 2

## 2021-10-25 MED ORDER — ONDANSETRON HCL 4 MG PO TABS: 4.0000 mg | ORAL_TABLET | Freq: Four times a day (QID) | ORAL | 0 refills | Status: DC | PRN

## 2021-10-25 MED ORDER — KETOROLAC TROMETHAMINE 15 MG/ML IJ SOLN
INTRAMUSCULAR | Status: AC
  Administered 2021-10-25: 15 mg
  Filled 2021-10-25: qty 1

## 2021-10-25 MED ORDER — BUPIVACAINE-EPINEPHRINE (PF) 0.25% -1:200000 IJ SOLN
INTRAMUSCULAR | Status: AC
  Filled 2021-10-25: qty 30

## 2021-10-25 MED ORDER — ONDANSETRON HCL 4 MG/2ML IJ SOLN: 4.0000 mg | Freq: Once | INTRAMUSCULAR | Status: DC | PRN

## 2021-10-25 MED ORDER — TRANEXAMIC ACID 1000 MG/10ML IV SOLN
INTRAVENOUS | Status: AC
  Filled 2021-10-25: qty 10

## 2021-10-25 MED ORDER — ONDANSETRON HCL 4 MG/2ML IJ SOLN: 4.0000 mg | Freq: Four times a day (QID) | INTRAMUSCULAR | Status: DC | PRN

## 2021-10-25 MED ORDER — METOCLOPRAMIDE HCL 5 MG/ML IJ SOLN: 5.0000 mg | Freq: Three times a day (TID) | INTRAMUSCULAR | Status: DC | PRN

## 2021-10-25 MED ORDER — BISACODYL 10 MG RE SUPP: 10.0000 mg | Freq: Every day | RECTAL | Status: DC | PRN

## 2021-10-25 MED ORDER — ACETAMINOPHEN 10 MG/ML IV SOLN
1000.0000 mg | Freq: Once | INTRAVENOUS | Status: DC | PRN
Start: 2021-10-25 — End: 2021-10-25

## 2021-10-25 MED ORDER — PHENYLEPHRINE HCL-NACL 20-0.9 MG/250ML-% IV SOLN
INTRAVENOUS | Status: DC | PRN
  Administered 2021-10-25: 50 ug/min via INTRAVENOUS

## 2021-10-25 MED ORDER — CEFAZOLIN SODIUM-DEXTROSE 2-4 GM/100ML-% IV SOLN
2.0000 g | Freq: Four times a day (QID) | INTRAVENOUS | Status: AC
  Administered 2021-10-25 – 2021-10-26 (×2): 2 g via INTRAVENOUS
  Filled 2021-10-25 (×3): qty 100

## 2021-10-25 MED ORDER — KETOROLAC TROMETHAMINE 15 MG/ML IJ SOLN
15.0000 mg | Freq: Once | INTRAMUSCULAR | Status: AC
  Administered 2021-10-25: 15 mg via INTRAVENOUS

## 2021-10-25 MED ORDER — PHENYLEPHRINE HCL-NACL 20-0.9 MG/250ML-% IV SOLN
INTRAVENOUS | Status: AC
  Filled 2021-10-25: qty 250

## 2021-10-25 MED ORDER — CEFAZOLIN SODIUM-DEXTROSE 2-4 GM/100ML-% IV SOLN
2.0000 g | Freq: Once | INTRAVENOUS | Status: AC
  Administered 2021-10-25: 2 g via INTRAVENOUS

## 2021-10-25 MED ORDER — BUPIVACAINE-EPINEPHRINE (PF) 0.25% -1:200000 IJ SOLN
INTRAMUSCULAR | Status: DC | PRN
  Administered 2021-10-25: 30 mL via PERINEURAL

## 2021-10-25 MED ORDER — PROPOFOL 500 MG/50ML IV EMUL
INTRAVENOUS | Status: DC | PRN
  Administered 2021-10-25: 50 ug/kg/min via INTRAVENOUS

## 2021-10-25 MED ORDER — DOCUSATE SODIUM 100 MG PO CAPS
100.0000 mg | ORAL_CAPSULE | Freq: Two times a day (BID) | ORAL | Status: DC
  Administered 2021-10-25 – 2021-10-29 (×8): 100 mg via ORAL
  Filled 2021-10-25 (×8): qty 1

## 2021-10-25 MED ORDER — BUPIVACAINE LIPOSOME 1.3 % IJ SUSP
INTRAMUSCULAR | Status: AC
  Filled 2021-10-25: qty 20

## 2021-10-25 MED ORDER — OXYCODONE HCL 5 MG PO TABS: 5.0000 mg | ORAL_TABLET | ORAL | 0 refills | Status: DC | PRN

## 2021-10-25 MED ORDER — KETAMINE HCL 10 MG/ML IJ SOLN
INTRAMUSCULAR | Status: DC | PRN
  Administered 2021-10-25 (×2): 10 mg via INTRAVENOUS

## 2021-10-25 MED ORDER — MAGNESIUM HYDROXIDE 400 MG/5ML PO SUSP
30.0000 mL | Freq: Every day | ORAL | Status: DC | PRN
  Filled 2021-10-25: qty 30

## 2021-10-25 MED ORDER — PHENYLEPHRINE HCL (PRESSORS) 10 MG/ML IV SOLN
INTRAVENOUS | Status: DC | PRN
  Administered 2021-10-25: 80 ug via INTRAVENOUS

## 2021-10-25 MED ORDER — DIPHENHYDRAMINE HCL 12.5 MG/5ML PO ELIX: 12.5000 mg | ORAL_SOLUTION | ORAL | Status: DC | PRN

## 2021-10-25 MED ORDER — ACETAMINOPHEN 500 MG PO TABS: 1000.0000 mg | ORAL_TABLET | Freq: Four times a day (QID) | ORAL | 0 refills | Status: DC

## 2021-10-25 MED ORDER — OXYCODONE HCL 5 MG PO TABS: 5.0000 mg | ORAL_TABLET | Freq: Once | ORAL | Status: DC | PRN

## 2021-10-25 MED ORDER — METOCLOPRAMIDE HCL 5 MG PO TABS: 5.0000 mg | ORAL_TABLET | Freq: Three times a day (TID) | ORAL | Status: DC | PRN

## 2021-10-25 MED ORDER — MIDAZOLAM HCL 2 MG/2ML IJ SOLN
INTRAMUSCULAR | Status: AC
  Filled 2021-10-25: qty 2

## 2021-10-25 MED ORDER — ONDANSETRON HCL 4 MG PO TABS: 4.0000 mg | ORAL_TABLET | Freq: Four times a day (QID) | ORAL | Status: DC | PRN

## 2021-10-25 SURGICAL SUPPLY — 72 items
BAG DECANTER FOR FLEXI CONT (MISCELLANEOUS) ×2 IMPLANT
BLADE SAGITTAL WIDE XTHICK NO (BLADE) IMPLANT
BLADE SURG SZ20 CARB STEEL (BLADE) ×2 IMPLANT
BNDG COHESIVE 6X5 TAN ST LF (GAUZE/BANDAGES/DRESSINGS) ×2 IMPLANT
CABLE CERLAGE W/CRIMP 1.8 (Cable) ×2 IMPLANT
CHLORAPREP W/TINT 26 (MISCELLANEOUS) ×4 IMPLANT
COVER BACK TABLE REUSABLE LG (DRAPES) ×2 IMPLANT
COVER MAYO STAND REUSABLE (DRAPES) ×2 IMPLANT
DRAPE 3/4 80X56 (DRAPES) ×2 IMPLANT
DRAPE IMP U-DRAPE 54X76 (DRAPES) ×1 IMPLANT
DRAPE INCISE IOBAN 66X60 STRL (DRAPES) ×2 IMPLANT
DRAPE ORTHO SPLIT 77X108 STRL (DRAPES) ×4
DRAPE SURG 17X11 SM STRL (DRAPES) ×4 IMPLANT
DRAPE SURG ORHT 6 SPLT 77X108 (DRAPES) ×2 IMPLANT
DRSG MEPILEX SACRM 8.7X9.8 (GAUZE/BANDAGES/DRESSINGS) ×1 IMPLANT
DRSG OPSITE POSTOP 4X10 (GAUZE/BANDAGES/DRESSINGS) ×1 IMPLANT
DRSG OPSITE POSTOP 4X12 (GAUZE/BANDAGES/DRESSINGS) ×2 IMPLANT
DRSG OPSITE POSTOP 4X14 (GAUZE/BANDAGES/DRESSINGS) ×2 IMPLANT
ELECT CAUTERY BLADE 6.4 (BLADE) ×2 IMPLANT
GAUZE 4X4 16PLY ~~LOC~~+RFID DBL (SPONGE) ×4 IMPLANT
GAUZE PACK 2X3YD (PACKING) ×2 IMPLANT
GLOVE BIO SURGEON STRL SZ7.5 (GLOVE) ×5 IMPLANT
GLOVE BIO SURGEON STRL SZ8 (GLOVE) ×7 IMPLANT
GLOVE BIOGEL PI IND STRL 8 (GLOVE) ×1 IMPLANT
GLOVE BIOGEL PI INDICATOR 8 (GLOVE) ×2
GLOVE SURG UNDER LTX SZ8 (GLOVE) ×2 IMPLANT
GOWN STRL REUS W/ TWL LRG LVL3 (GOWN DISPOSABLE) ×2 IMPLANT
GOWN STRL REUS W/ TWL XL LVL3 (GOWN DISPOSABLE) ×1 IMPLANT
GOWN STRL REUS W/TWL LRG LVL3 (GOWN DISPOSABLE) ×4
GOWN STRL REUS W/TWL XL LVL3 (GOWN DISPOSABLE) ×2
HEAD CERAMIC BIOLOX 36 T1 STD (Head) ×1 IMPLANT
HOLSTER ELECTROSUGICAL PENCIL (MISCELLANEOUS) ×2 IMPLANT
HOOD PEEL AWAY FLYTE STAYCOOL (MISCELLANEOUS) ×6 IMPLANT
IV NS 100ML SINGLE PACK (IV SOLUTION) ×1 IMPLANT
IV NS IRRIG 3000ML ARTHROMATIC (IV SOLUTION) ×2 IMPLANT
KIT TURNOVER KIT A (KITS) ×2 IMPLANT
MANIFOLD NEPTUNE II (INSTRUMENTS) ×2 IMPLANT
NDL FILTER BLUNT 18X1 1/2 (NEEDLE) ×1 IMPLANT
NDL SAFETY ECLIPSE 18X1.5 (NEEDLE) ×1 IMPLANT
NDL SPNL 20GX3.5 QUINCKE YW (NEEDLE) ×1 IMPLANT
NDL SPNL 22GX3.5 QUINCKE BK (NEEDLE) ×1 IMPLANT
NEEDLE FILTER BLUNT 18X 1/2SAF (NEEDLE) ×2
NEEDLE FILTER BLUNT 18X1 1/2 (NEEDLE) ×1 IMPLANT
NEEDLE HYPO 18GX1.5 SHARP (NEEDLE) ×2
NEEDLE SPNL 20GX3.5 QUINCKE YW (NEEDLE) ×2 IMPLANT
NEEDLE SPNL 22GX3.5 QUINCKE BK (NEEDLE) ×2 IMPLANT
NS IRRIG 1000ML POUR BTL (IV SOLUTION) ×2 IMPLANT
PACK HIP PROSTHESIS (MISCELLANEOUS) ×2 IMPLANT
PRESSURIZER CEMENT PROX FEM SM (MISCELLANEOUS) ×2 IMPLANT
PULSAVAC PLUS IRRIG FAN TIP (DISPOSABLE) ×2
SPONGE T-LAP 18X18 ~~LOC~~+RFID (SPONGE) ×10 IMPLANT
STAPLER SKIN PROX 35W (STAPLE) ×2 IMPLANT
STEM DISTAL W/LOCK SCRW 15X150 (Stem) ×1 IMPLANT
STEM FEM VG 15X120 (Stem) ×1 IMPLANT
SUT ETHIBOND #5 BRAIDED 30INL (SUTURE) ×4 IMPLANT
SUT ETHIBOND CT1 BRD #0 30IN (SUTURE) ×4 IMPLANT
SUT ETHIBOND CT1 BRD 2-0 30IN (SUTURE) ×4 IMPLANT
SUT VIC AB 1 CT1 36 (SUTURE) ×4 IMPLANT
SUT VIC AB 1 CTX 27 (SUTURE) ×2 IMPLANT
SUT VIC AB 2-0 CT1 (SUTURE) ×4 IMPLANT
SUT VIC AB 2-0 CT1 27 (SUTURE) ×4
SUT VIC AB 2-0 CT1 TAPERPNT 27 (SUTURE) ×4 IMPLANT
SYR 10ML LL (SYRINGE) ×2 IMPLANT
SYR 20ML LL LF (SYRINGE) ×2 IMPLANT
SYR 30ML LL (SYRINGE) ×2 IMPLANT
SYR TB 1ML 27GX1/2 LL (SYRINGE) ×2 IMPLANT
TAPE TRANSPORE STRL 2 31045 (GAUZE/BANDAGES/DRESSINGS) ×2 IMPLANT
TIP BRUSH PULSAVAC PLUS 24.33 (MISCELLANEOUS) ×2 IMPLANT
TIP FAN IRRIG PULSAVAC PLUS (DISPOSABLE) ×1 IMPLANT
TRAY FOLEY MTR SLVR 16FR STAT (SET/KITS/TRAYS/PACK) ×1 IMPLANT
TUBING CONNECTING 10 (TUBING) ×1 IMPLANT
WATER STERILE IRR 500ML POUR (IV SOLUTION) ×2 IMPLANT

## 2021-10-25 NOTE — Transfer of Care (Signed)
Immediate Anesthesia Transfer of Care Note  Patient: Adrian Zavala  Procedure(s) Performed: REVISION FEMORAL STEM RIGHT HIP (Right: Hip)  Patient Location: PACU  Anesthesia Type:Spinal  Level of Consciousness: drowsy  Airway & Oxygen Therapy: Patient Spontanous Breathing and Patient connected to face mask oxygen  Post-op Assessment: Report given to RN and Post -op Vital signs reviewed and stable  Post vital signs: Reviewed and stable  Last Vitals:  Vitals Value Taken Time  BP 121/58 10/25/21 1840  Temp    Pulse 64 10/25/21 1847  Resp 20 10/25/21 1847  SpO2 100 % 10/25/21 1847  Vitals shown include unvalidated device data.  Last Pain:  Vitals:   10/25/21 1535  TempSrc: Oral  PainSc:       Patients Stated Pain Goal: 0 (88/82/80 0349)  Complications: No notable events documented.

## 2021-10-25 NOTE — Progress Notes (Addendum)
Subjective: 1 Day Post-Op Procedure(s) (LRB): TOTAL HIP ARTHROPLASTY (Right) Patient reports pain as moderate.   Patient is  well but did fall while going to the bathroom and felt a pop in his right hip. Plan is to go Home after hospital stay. Negative for chest pain and shortness of breath Fever: no Gastrointestinal:Negative for nausea and vomiting Patient states he is passing gas without pain.  Objective: Vital signs in last 24 hours: Temp:  [97 F (36.1 C)-98.2 F (36.8 C)] 98.2 F (36.8 C) (08/02 0337) Pulse Rate:  [75-85] 84 (08/02 0337) Resp:  [12-21] 17 (08/02 0337) BP: (96-141)/(54-75) 141/67 (08/02 0337) SpO2:  [97 %-100 %] 98 % (08/02 0337) Weight:  [87.8 kg] 87.8 kg (08/01 0906)  Intake/Output from previous day:  Intake/Output Summary (Last 24 hours) at 10/25/2021 0809 Last data filed at 10/25/2021 0409 Gross per 24 hour  Intake 1724.01 ml  Output 1475 ml  Net 249.01 ml    Intake/Output this shift: No intake/output data recorded.  Labs: Recent Labs    10/25/21 0242  HGB 12.3*   Recent Labs    10/25/21 0242  WBC 12.4*  RBC 3.81*  HCT 36.0*  PLT 174   Recent Labs    10/25/21 0242  NA 141  K 3.9  CL 108  CO2 24  BUN 20  CREATININE 1.20  GLUCOSE 141*  CALCIUM 9.8   No results for input(s): "LABPT", "INR" in the last 72 hours.   EXAM General - Patient is Alert, Appropriate, and Oriented Extremity - ABD soft Neurovascular intact Dorsiflexion/Plantar flexion intact Incision: dressing C/D/I No cellulitis present Leg lengths are equal this morning. He is able to tolerate gentle log rolling to the right hip without significant pain. He denies any significant tenderness with palpation over the greater trochanteric region. Dressing/Incision - clean, dry, no drainage Motor Function - intact, moving foot and toes well on exam.  Abdomen soft with intact bowels sounds.  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   a.) cCTA 05/07/2016: measured 41 mm; b.) TTE 06/10/2017: measured 39 mm; c.) cCTA 08/04/2020: measured 39 mm; d.) TTE 08/15/2020: measured 38 mm   BPH (benign prostatic hyperplasia)    Coronary artery disease    Crohn's disease ([place])    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[place])    a.) exogenous 2/2 prolonged corticosteroid use   Diastolic dysfunction 56/31/4970   a.) TTE 08/15/2020: EF 60-65%, mild MR, mod AoV sclerosis, G1DD   Elevated hemoglobin A1c    Fatty liver disease, nonalcoholic    GERD (gastroesophageal reflux disease)    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HCC)    Heart murmur    Hemorrhoid    Hypercholesteremia    Hypertension    Hypothyroidism    IBS (irritable bowel syndrome)    Inflammatory eye disease    Long term current use of systemic steroids    a.) low dose prednisone   Long-term current use of bisphosphonate    a.) ibandronate   Lower extremity weakness    a.) s/p chemo for Wegener's   Lumbar stenosis    Lung disorder    S/P chemo for Wegener's   Osteopenia    Osteoporosis    Pre-diabetes  RBBB (right bundle branch block)    Seasonal allergies    Wegener's granulomatosis    a.) s/p Tx with cyclophosphamide + MTX; ongoing treatment with chronic corticosteroids    Assessment/Plan: 1 Day Post-Op Procedure(s) (LRB): TOTAL HIP ARTHROPLASTY (Right) Principal Problem:   Status post total hip replacement, right  Estimated body mass index is 31.24 kg/m as calculated from the following:   Height as of this encounter: 5' 6"  (1.676 m).   Weight as of this encounter: 87.8 kg.  Labs reviewed this AM, WBC 12.4, no recent fevers. Patient did have a fall in the bathroom this morning, no evidence for dislocation to the right hip.  No acute fracture is seen on AP pelvis  x-rays.  Dedicated hip x-rays will be obtained later today. Partial weightbearing to the right hip until x-rays have been obtained, PT to hold until updated x-rays are obtained. Will plan on proceeding with PT today, if performs well he may still go home today however this is pending x-rays and further PT workup. Patient is passing gas without pain this morning.  DVT Prophylaxis -  Eliquis, SCDs Partial weightbearing to the right leg.  Raquel James, PA-C [facility] Orthopaedic Surgery 10/25/2021, 8:09 AM    Addendum: The patient's repeat pelvis and right hip films confirmed the presence of a displaced medial calcar fracture with subsidence of the femoral stem.  Therefore, after discussing the treatment options with the patient and his wife, who is at the bedside, we will proceed with a revision of the femoral component to a modular revision stem with diaphyseal fixation and cable fixation of the calcar fracture.  This procedure has been discussed in detail with the patient and his wife, as have the potential risks (including bleeding, infection, nerve and/or blood vessel injury, persistent or recurrent pain, malunion and/or nonunion, loosening of and/or failure of the components, dislocation, leg length inequality, need for further surgery, blood clots, strokes, heart attacks and or arrhythmias, etc) and benefits.  The patient states his understanding wishes to proceed.  A formal written consent will be obtained by the nursing staff.  Pascal Lux, MD [facility] Orthopedic Surgery 10/25/2021, 4:10 PM

## 2021-10-25 NOTE — Anesthesia Preprocedure Evaluation (Signed)
Anesthesia Evaluation  Patient identified by MRN, date of birth, ID band Patient awake    Reviewed: Allergy & Precautions, NPO status , Patient's Chart, lab work & pertinent test results  History of Anesthesia Complications Negative for: history of anesthetic complications  Airway Mallampati: II  TM Distance: >3 FB Neck ROM: Full    Dental  (+) Partial Lower   Pulmonary neg pulmonary ROS, neg sleep apnea, neg COPD, Patient abstained from smoking.Not current smoker, former smoker,    Pulmonary exam normal breath sounds clear to auscultation       Cardiovascular Exercise Tolerance: Good METShypertension, Pt. on medications + CAD  (-) Past MI (-) dysrhythmias  Rhythm:Regular Rate:Normal - Systolic murmurs Most recent TTE was performed on 08/15/2020 revealing a normal left ventricular systolic function with an EF of 60 to 65%.  There were no regional wall motion abnormalities. Diastolic Doppler parameters consistent with abnormal relaxation (G1DD).  Mild mitral valve regurgitation noted.  Moderate aortic valve sclerosis/calcification present with no evidence of aortic valve stenosis.  Borderline dilatation of the ascending aorta noted with a maximum diameter of 38 mm. Of note, previous echocardiogram from 06/10/2017 revealed very mild aortic valve stenosis with a mean pressure gradient of 5 mmHg; AVA (VTI) = 2.22 cm.     Neuro/Psych negative neurological ROS  negative psych ROS   GI/Hepatic GERD  Medicated and Controlled,(+)     (-) substance abuse  ,   Endo/Other  neg diabetesHypothyroidism   Renal/GU negative Renal ROS     Musculoskeletal  (+) Arthritis , Osteoarthritis,    Abdominal   Peds  Hematology  (+) Blood dyscrasia, anemia ,   Anesthesia Other Findings Past Medical History: No date: Anemia No date: Aortic atherosclerosis ([place]) 06/10/2017: Aortic stenosis     Comment:  a.) TTE 06/10/2017: EF 60-65%; very  mild AS with MPG 5               mmHG; AVA (VTI) = 2.22 cm No date: Arthritis     Comment:  Spine, Left Hip, Hands 05/07/2016: Ascending aorta dilatation (HCC)     Comment:  a.) cCTA 05/07/2016: measured 41 mm; b.) TTE 06/10/2017:              measured 39 mm; c.) cCTA 08/04/2020: measured 39 mm; d.)               TTE 08/15/2020: measured 38 mm No date: BPH (benign prostatic hyperplasia) No date: Coronary artery disease No date: Crohn's disease ([place]) No date: 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[place])     Comment:  a.) exogenous 2/2 prolonged corticosteroid use 26/94/8546: Diastolic dysfunction     Comment:  a.) TTE 08/15/2020: EF 60-65%, mild MR, mod AoV               sclerosis, G1DD No date: Elevated hemoglobin A1c No date: Fatty liver disease, nonalcoholic No date: GERD (gastroesophageal reflux disease) No date: 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HCC) No date: Heart murmur No date: Hemorrhoid No date: Hypercholesteremia No date: Hypertension No date: Hypothyroidism No date: IBS (irritable bowel syndrome) No date: Inflammatory eye disease No date: Long term current use of systemic steroids     Comment:  a.) low dose prednisone No date: Long-term current use of bisphosphonate     Comment:  a.) ibandronate No date: Lower extremity weakness     Comment:  a.) s/p chemo for Wegener's No date: Lumbar stenosis No date: Lung disorder     Comment:  S/P chemo for Wegener's  No date: Osteopenia No date: Osteoporosis No date: Pre-diabetes No date: RBBB (right bundle branch block) No date: Seasonal allergies 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     Comment:  a.) s/p Tx with cyclophosphamide + MTX; ongoing               treatment with chronic corticosteroids  Reproductive/Obstetrics                             Anesthesia Physical Anesthesia Plan  ASA: 3  Anesthesia Plan: Spinal   Post-op Pain Management:    Induction: Intravenous  PONV Risk Score and Plan: 1 and  Ondansetron, Dexamethasone, Propofol infusion, TIVA and Midazolam  Airway Management Planned: Natural Airway  Additional Equipment: None  Intra-op Plan:   Post-operative Plan:   Informed Consent: I have reviewed the patients History and Physical, chart, labs and discussed the procedure including the risks, benefits and alternatives for the proposed anesthesia with the patient or authorized representative who has indicated his/her understanding and acceptance.       Plan Discussed with: CRNA and Surgeon  Anesthesia Plan Comments: (S/p hip replacement yesterday under spinal anesthesia. It has worn off by now, and patient has not received any anticoagulants. Normal Platelets. Last NPO at [phone] full breakfast. Will proceed with spinal anesthetic again at the 8hr NPO mark. Discussed R/B/A of neuraxial anesthesia technique with patient: - rare risks of spinal/epidural hematoma, nerve damage, infection - Risk of PDPH - Risk of nausea and vomiting - Risk of conversion to general anesthesia and its associated risks, including sore throat, damage to lips/eyes/teeth/oropharynx, and rare risks such as cardiac and respiratory events. - Risk of allergic reactions  Discussed the role of CRNA in patient's perioperative care.  Patient voiced understanding.)        Anesthesia Quick Evaluation

## 2021-10-25 NOTE — Anesthesia Procedure Notes (Signed)
Spinal  Start time: 10/25/2021 4:25 PM End time: 10/25/2021 4:29 PM Reason for block: surgical anesthesia Staffing Performed: resident/CRNA  Anesthesiologist: Arita Miss, MD Resident/CRNA: Beverely Low, CRNA Performed by: Beverely Low, CRNA Authorized by: Molli Barrows, MD   Spinal Block Patient position: sitting Prep: ChloraPrep Patient monitoring: continuous pulse ox and blood pressure Approach: midline Location: L3-4 Injection technique: single-shot Needle Needle type: Pencan  Needle gauge: 24 G

## 2021-10-25 NOTE — Progress Notes (Signed)
PT Cancellation Note  Patient Details Name: Adrian Zavala MRN: [id number] DOB: 1950-03-30   Cancelled Treatment:     PT attempt. New x-rays results viewed. Planned surgery this afternoon scheduled. PT will re-evaluate tomorrow as requested by MD.     Willette Pa 10/25/2021, 10:00 AM

## 2021-10-25 NOTE — Plan of Care (Signed)
  Problem: Education: Goal: Knowledge of General Education information will improve Description: Including pain rating scale, medication(s)/side effects and non-pharmacologic comfort measures Outcome: Progressing   Problem: Health Behavior/Discharge Planning: Goal: Ability to manage health-related needs will improve Outcome: Progressing   Problem: Clinical Measurements: Goal: Ability to maintain clinical measurements within normal limits will improve Outcome: Progressing   Problem: Clinical Measurements: Goal: Will remain free from infection Outcome: Progressing   Problem: Clinical Measurements: Goal: Diagnostic test results will improve Outcome: Progressing   Problem: Clinical Measurements: Goal: Respiratory complications will improve Outcome: Progressing   Problem: Clinical Measurements: Goal: Cardiovascular complication will be avoided Outcome: Progressing   Problem: Activity: Goal: Risk for activity intolerance will decrease Outcome: Progressing   Problem: Nutrition: Goal: Adequate nutrition will be maintained Outcome: Progressing   Problem: Coping: Goal: Level of anxiety will decrease Outcome: Progressing   Problem: Elimination: Goal: Will not experience complications related to bowel motility Outcome: Progressing   Problem: Elimination: Goal: Will not experience complications related to urinary retention Outcome: Progressing   Problem: Pain Managment: Goal: General experience of comfort will improve Outcome: Progressing   Problem: Safety: Goal: Ability to remain free from injury will improve Outcome: Progressing   Problem: Skin Integrity: Goal: Risk for impaired skin integrity will decrease Outcome: Progressing

## 2021-10-25 NOTE — Discharge Instructions (Signed)
Instructions after Total Hip Replacement     J. Dorien Chihuahua, M.D.  Raquel Veronica Guerrant, PA-C     Dept[facility]  [address], [place]  [address]  Phone: [phone]   Fax: [phone]    DIET: Drink plenty of non-alcoholic fluids. Resume your normal diet. Include foods high in fiber.  ACTIVITY:  You may use crutches or a walker with weight-bearing as tolerated, unless instructed otherwise. You may be weaned off of the walker or crutches by your Physical Therapist.  Do NOT reach below the level of your knees or cross your legs until allowed.    Continue doing gentle exercises. Exercising will reduce the pain and swelling, increase motion, and prevent muscle weakness.   Please continue to use the TED compression stockings for 6 weeks. You may remove the stockings at night, but should reapply them in the morning. Do not drive or operate any equipment until instructed.  WOUND CARE:  Continue to use ice packs periodically to reduce pain and swelling. Keep the incision clean and dry. You may bathe or shower after the staples are removed at the first office visit following surgery.  MEDICATIONS: You may resume your regular medications. Please take the pain medication as prescribed on the medication. Do not take pain medication on an empty stomach. You have been given a prescription for a blood thinner to prevent blood clots. Please take the medication as instructed. (NOTE: After completing a 2 week course of Lovenox, take one Enteric-coated aspirin once a day.) Pain medications and iron supplements can cause constipation. Use a stool softener (Senokot or Colace) on a daily basis and a laxative (dulcolax or miralax) as needed. Do not drive or drink alcoholic beverages when taking pain medications.  CALL THE OFFICE FOR: Temperature above 101 degrees Excessive bleeding or drainage on the dressing. Excessive swelling, coldness, or  paleness of the toes. Persistent nausea and vomiting.  FOLLOW-UP:  You should have an appointment to return to the office in 2 weeks after surgery. Arrangements have been made for continuation of Physical Therapy (either home therapy or outpatient therapy).

## 2021-10-25 NOTE — Op Note (Signed)
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  Post-Op Diagnosis:   Same  Procedure:   Revision of femoral component with cable fixation of medial calcar fracture status post right total hip arthroplasty.  Surgeon:   Pascal Lux, MD  Assistant:   Cameron Proud, PA-C; Almon Register, PA-S  Anesthesia:   Spinal  Findings:   As above.  Complications:   None  Fluids:   800 cc crystalloid  EBL:   25 cc  UOP:   None  TT:   None  Drains:   None  Closure:   Staples  Implants:   [employer] Arcos 15 x 150 mm revision stem with 70 mm C metaphyseal component and 36 mm ceramic head with +0 mm neck  Brief Clinical Note:   The patient is a 71 year old male who is now 1 day status post a right total hip arthroplasty.  When he got out of bed this morning to go to the bathroom when he lost his balance and fell against the toilet, injuring his right hip.  Subsequent x-rays confirm the presence of a displaced medial calcar fracture with subsidence of the femoral stem.  The patient presents at this time for definitive management of this injury.  Procedure:   The patient was brought into the operating room and lain in the supine position.  After adequate spinal anesthesia was obtained, the patient was repositioned in the left lateral decubitus position and secured using a lateral hip positioner.  The previously placed staples were removed from the right hip before the right hip and lower extremity were prepped with ChloraPrep solution and draped sterilely.  Preoperative antibiotics were administered.  A timeout was performed to verify the appropriate surgical site.  The surgical incision was reopened, extending it 1 inch distally.  The incision was carried down through the subcutaneous tissues, removing the previously placed Vicryl sutures in the process.  The iliotibial band was  identified and reopened, again removing the retained Vicryl sutures, before the Tycron sutures were removed from the posterior capsular closure.  The hip was dislocated and the femoral stem removed easily.  The wound was copiously irrigated with sterile saline solution using the jet lavage system to remove the postoperative hematoma.  The medial calcar fracture was reduced and stabilized using two 1.8 mm cerclage cables which were tightened firmly and locked securely before the wires were cut short.  The adequacy of reduction was verified by palpation and visually.  The femoral canal was prepared by reaming the canal distally with the 150 mm reamers beginning with a 12 mm diameter reamer and progressing to a 15 mm diameter reamer which could be sunk to the 70 mm line at the tip of the greater trochanter.  After verifying with a sound that this could be seated properly, the permanent 15 x 150 mm diaphyseal component was impacted into place and sunk to the 70 mm line.  The proximal component was reamed sequentially beginning with the A diameter and progressing to the C diameter component.  This was felt to provide good metaphyseal chatter.  The 70 mm C trial component was secured with the appropriate version and a trial reduction performed using the +0 mm head.  This provided excellent stability both in extension and external rotation as well as with flexion and internal rotation.  The hip was stable with internal rotation to  70 degrees at 90 degrees of flexion as well as in the position of sleep.    The hip was dislocated and the trial components removed.  The permanent 70 mm C component was impacted into place and secured using the appropriate locking screw with care taken to ensure the version was unchanged.  A repeat trial reduction with the +0 mm head demonstrated the findings as described above.  Therefore, the permanent 36 mm ceramic head with a +0 mm neck was impacted into place and its Morse taper  locking mechanism verified with manual distraction.  Hip again was relocated and placed through a range of motion with the findings as described above.  The wound was copiously irrigated with sterile saline solution using the jet lavage system before the peri-incisional and pericapsular tissues were injected with 30 cc of 0.5% Sensorcaine with epinephrine to help with postoperative analgesia.  The posterior capsular flap was reapproximated to the posterior aspect of the greater trochanter using #2 Tycron interrupted sutures placed through drill holes utilized previously.  Several additional #2 Tycron interrupted sutures were used to reinforce this layer of closure.  The iliotibial band was reapproximated using #1 Vicryl interrupted sutures before the gluteal fascia was closed using a running #1 Vicryl suture.  The subcutaneous tissues were closed in several layers using 2-0 Vicryl interrupted sutures before the skin was closed using staples.  A sterile occlusive dressing was applied to the wound.  The patient was then rolled back into the supine position on his hospital bed before being awakened and returned to the recovery room in satisfactory condition after tolerating the procedure well.

## 2021-10-25 NOTE — Anesthesia Postprocedure Evaluation (Signed)
Anesthesia Post Note  Patient: Adrian Zavala  Procedure(s) Performed: REVISION FEMORAL STEM RIGHT HIP (Right: Hip)  Patient location during evaluation: PACU Anesthesia Type: Spinal Level of consciousness: awake and alert Pain management: pain level controlled Vital Signs Assessment: post-procedure vital signs reviewed and stable Respiratory status: spontaneous breathing, nonlabored ventilation, respiratory function stable and patient connected to nasal cannula oxygen Cardiovascular status: blood pressure returned to baseline and stable Postop Assessment: no apparent nausea or vomiting Anesthetic complications: no   No notable events documented.   Last Vitals:  Vitals:   10/25/21 1930 10/25/21 1946  BP: 133/62 125/67  Pulse: 71 69  Resp: 13 16  Temp:  36.6 C  SpO2: 98% 100%    Last Pain:  Vitals:   10/25/21 1945  TempSrc:   PainSc: 0-No pain                 Molli Barrows

## 2021-10-25 NOTE — Progress Notes (Addendum)
The patient will be going to Outpatient PT He has a rolling walker at home 3 in 1  will be delivered to the patient's room by adapt Wife to provide transportation,

## 2021-10-25 NOTE — Anesthesia Postprocedure Evaluation (Signed)
Anesthesia Post Note  Patient: Adrian Zavala  Procedure(s) Performed: TOTAL HIP ARTHROPLASTY (Right: Hip)  Patient location during evaluation: Nursing Unit Anesthesia Type: Spinal Level of consciousness: oriented and awake and alert Pain management: pain level controlled Vital Signs Assessment: post-procedure vital signs reviewed and stable Respiratory status: spontaneous breathing Cardiovascular status: blood pressure returned to baseline and stable Postop Assessment: no headache, no backache, no apparent nausea or vomiting and patient able to bend at knees Anesthetic complications: no   No notable events documented.   Last Vitals:  Vitals:   10/24/21 2030 10/25/21 0337  BP: 133/70 (!) 141/67  Pulse: 84 84  Resp: 15 17  Temp: 36.8 C 36.8 C  SpO2: 98% 98%    Last Pain:  Vitals:   10/25/21 0337  TempSrc: Oral  PainSc:                  Precious Haws Evora Schechter

## 2021-10-26 LAB — BASIC METABOLIC PANEL
Anion gap: 5 (ref 5–15)
BUN: 25 mg/dL — ABNORMAL HIGH (ref 8–23)
CO2: 24 mmol/L (ref 22–32)
Calcium: 9.2 mg/dL (ref 8.9–10.3)
Chloride: 113 mmol/L — ABNORMAL HIGH (ref 98–111)
Creatinine, Ser: 1.01 mg/dL (ref 0.61–1.24)
GFR, Estimated: >60 mL/min (ref >60)
Glucose, Bld: 178 mg/dL — ABNORMAL HIGH (ref 70–99)
Potassium: 3.9 mmol/L (ref 3.5–5.1)
Sodium: 142 mmol/L (ref 135–145)

## 2021-10-26 LAB — CBC
HCT: 31.4 % — ABNORMAL LOW (ref 39.0–52.0)
Hemoglobin: 10.7 g/dL — ABNORMAL LOW (ref 13.0–17.0)
MCH: 32.6 pg (ref 26.0–34.0)
MCHC: 34.1 g/dL (ref 30.0–36.0)
MCV: 95.7 fL (ref 80.0–100.0)
Platelets: 170 10*3/uL (ref 150–400)
RBC: 3.28 MIL/uL — ABNORMAL LOW (ref 4.22–5.81)
RDW: 13.2 % (ref 11.5–15.5)
WBC: 21.8 10*3/uL — ABNORMAL HIGH (ref 4.0–10.5)
nRBC: 0 % (ref 0.0–0.2)

## 2021-10-26 LAB — SURGICAL PATHOLOGY

## 2021-10-26 NOTE — Evaluation (Addendum)
Physical Therapy Re-Evaluation Patient Details Name: Adrian Zavala MRN: [id number] DOB: 1950/08/25 Today's Date: 10/26/2021  History of Present Illness  71 y/o male s/p R total hip replacement (posterior approach) 8/1. Pt is now s/p R THA revision following fall in hosp 10/25/21.  Clinical Impression  Pt seen as re-evaluation this session due to R THA revision, goals updated as appropriate. Pt presents to PT in bed and agreeable to participate in therapy services. Pt reported 5-6/10 in R hip at rest, 8/10 pain with mobility, RN notified. Pt requires modA to perform sup<>EOB xfer, minA for STS from EOB, CGA from recliner, and CGA for amb. Pt able to amb 50 ft w CGA and RW w no LOB, close chair follow due to pt's initial complaints of dizziness. Pt displays FOF behavior following fall 8/2. Pt provided edu on WB status and post hip precautions, Pt verbalized understanding. Pt required significant verbal cueing for proper hand placement during xfers.  Pt received pain medication from RN during session, improving tolerance to tx. Pt will benefit from skilled PT services to safely address deficits outlined and to return to PLOF. Recommendation at this time is HHPT with frequent/constant supervision/assistance pending further progress with mobility.       Recommendations for follow up therapy are one component of a multi-disciplinary discharge planning process, led by the attending physician.  Recommendations may be updated based on patient status, additional functional criteria and insurance authorization.  Follow Up Recommendations Home health PT      Assistance Recommended at Discharge Frequent or constant Supervision/Assistance  Patient can return home with the following  A little help with bathing/dressing/bathroom;Assistance with cooking/housework;Assist for transportation;A little help with walking and/or transfers    Equipment Recommendations BSC/3in1  Recommendations for Other Services        Functional Status Assessment Patient has had a recent decline in their functional status and demonstrates the ability to make significant improvements in function in a reasonable and predictable amount of time.     Precautions / Restrictions Precautions Precautions: Posterior Hip Precaution Booklet Issued: No Restrictions Weight Bearing Restrictions: Yes RLE Weight Bearing: Partial weight bearing RLE Partial Weight Bearing Percentage or Pounds: 50%      Mobility  Bed Mobility Overal bed mobility: Needs Assistance Bed Mobility: Supine to Sit     Supine to sit: Mod assist     General bed mobility comments: cues to use bed rails to assist, modA for RLE Patient Response: Cooperative  Transfers Overall transfer level: Needs assistance Equipment used: Rolling walker (2 wheels) Transfers: Sit to/from Stand Sit to Stand: Min assist, Min guard           General transfer comment: minA for initial STS, CGA for subsequent xsfers. sig verbal cueing for proper hand placement.    Ambulation/Gait Ambulation/Gait assistance: Min guard Gait Distance (Feet): 50 Feet Assistive device: Rolling walker (2 wheels) Gait Pattern/deviations: Decreased stance time - right, Decreased weight shift to right, Antalgic, Step-through pattern Gait velocity: slow     General Gait Details: decr weight shift to R as appropriate for pt's WB statu.  Stairs            Wheelchair Mobility    Modified Rankin (Stroke Patients Only)       Balance Overall balance assessment: History of Falls, Needs assistance Sitting-balance support: Bilateral upper extremity supported, Feet supported Sitting balance-Leahy Scale: Fair     Standing balance support: Bilateral upper extremity supported, Reliant on assistive device for balance Standing balance-Leahy Scale:  Fair                               Pertinent Vitals/Pain Pain Assessment Pain Assessment: 0-10 Pain Score: 8  Pain  Location: R hip Pain Descriptors / Indicators: Burning, Sore, Moaning, Grimacing Pain Intervention(s): Monitored during session, RN gave pain meds during session, Limited activity within patient's tolerance, Repositioned    Home Living Family/patient expects to be discharged to:: Private residence Living Arrangements: Spouse/significant other     Home Access: Stairs to enter   Technical brewer of Steps: 3   Home Layout: One level Home Equipment: Conservation officer, nature (2 wheels)      Prior Function Prior Level of Function : Working/employed             Mobility Comments: over the last month he has had so much pain he was using a w/c ADLs Comments: recent hip pain has necessitated wife was helping him don shoes/socks     Hand Dominance        Extremity/Trunk Assessment   Upper Extremity Assessment Upper Extremity Assessment: Overall WFL for tasks assessed    Lower Extremity Assessment Lower Extremity Assessment:  (s/p R RTHA, able to move LLE against gravity)    Cervical / Trunk Assessment Cervical / Trunk Assessment: Normal  Communication   Communication: No difficulties  Cognition Arousal/Alertness: Awake/alert Behavior During Therapy: WFL for tasks assessed/performed Overall Cognitive Status: Within Functional Limits for tasks assessed                                          General Comments      Exercises     Assessment/Plan    PT Assessment Patient needs continued PT services  PT Problem List Decreased strength;Decreased range of motion;Decreased activity tolerance;Decreased balance;Decreased mobility;Decreased coordination;Decreased knowledge of use of DME;Decreased safety awareness;Pain;Decreased knowledge of precautions       PT Treatment Interventions DME instruction;Gait training;Stair training;Functional mobility training;Therapeutic activities;Therapeutic exercise;Balance training;Neuromuscular re-education;Patient/family  education    PT Goals (Current goals can be found in the Care Plan section)  Acute Rehab PT Goals Patient Stated Goal: to go home PT Goal Formulation: With patient Time For Goal Achievement: 11/08/21 Potential to Achieve Goals: Good    Frequency BID     Co-evaluation               AM-PAC PT "6 Clicks" Mobility  Outcome Measure Help needed turning from your back to your side while in a flat bed without using bedrails?: A Lot Help needed moving from lying on your back to sitting on the side of a flat bed without using bedrails?: A Lot Help needed moving to and from a bed to a chair (including a wheelchair)?: A Little Help needed standing up from a chair using your arms (e.g., wheelchair or bedside chair)?: A Little Help needed to walk in hospital room?: A Little Help needed climbing 3-5 steps with a railing? : A Lot 6 Click Score: 15    End of Session Equipment Utilized During Treatment: Gait belt Activity Tolerance: Patient tolerated treatment well;Patient limited by pain Patient left: with chair alarm set;with call bell/phone within reach;in chair (Pt requested to remove SCDs) Nurse Communication: Mobility status PT Visit Diagnosis: Muscle weakness (generalized) (M62.81);Difficulty in walking, not elsewhere classified (R26.2);Pain Pain - Right/Left: Right Pain - part of  body: Hip    Time: 7124-5809 PT Time Calculation (min) (ACUTE ONLY): 31 min   Charges:   PT Evaluation $PT Re-evaluation: 1 Re-eval PT Treatments $Gait Training: 8-22 mins $Therapeutic Activity: 8-22 mins         Glenice Laine MPH, SPT 10/26/21, 11:00 AM

## 2021-10-26 NOTE — Progress Notes (Addendum)
Physical Therapy Treatment Patient Details Name: Adrian Zavala MRN: [id number] DOB: 07/12/1950 Today's Date: 10/26/2021   History of Present Illness 71 y/o male s/p R total hip replacement (posterior approach) 8/1. Pt is now s/p R THA revision following fall in hosp 10/25/21.    PT Comments    Pt presents to PT in bed and agreeable to participate in therapy services. Pt requires modA and extra time to perform sup<>sit<>sup xfer for trunk control and CGA for amb and STS. Pt denies dizziness with positional changes. Pt able to amb 100 ft in hallway w chair follow, CGA, and RW w no LOB. Pt reports 8/10 pain in R hip. Pt educated on post hip precautions and PWB status, pt able to recall only 1/3 hip precautions. Pt will benefit from PT services upon discharge to safely address deficits listed in patient problem and eventual return to PLOF, recommendation remains appropriate.     Recommendations for follow up therapy are one component of a multi-disciplinary discharge planning process, led by the attending physician.  Recommendations may be updated based on patient status, additional functional criteria and insurance authorization.  Follow Up Recommendations  Home health PT     Assistance Recommended at Discharge Frequent or constant Supervision/Assistance  Patient can return home with the following A little help with bathing/dressing/bathroom;Assistance with cooking/housework;Assist for transportation;A little help with walking and/or transfers;Help with stairs or ramp for entrance;Direct supervision/assist for medications management   Equipment Recommendations  BSC/3in1;Rolling walker (2 wheels)    Recommendations for Other Services       Precautions / Restrictions Precautions Precautions: Posterior Hip Precaution Booklet Issued: Yes (comment) Restrictions Weight Bearing Restrictions: Yes RLE Weight Bearing: Partial weight bearing RLE Partial Weight Bearing Percentage or Pounds: 50%      Mobility  Bed Mobility Overal bed mobility: Needs Assistance Bed Mobility: Supine to Sit     Supine to sit: Mod assist     General bed mobility comments: cues to use bed rails to assist, modA for RLE Patient Response: Cooperative  Transfers Overall transfer level: Needs assistance Equipment used: Rolling walker (2 wheels) Transfers: Sit to/from Stand Sit to Stand: Min assist, Min guard           General transfer comment: minA for initial STS, CGA for subsequent xsfers. mod verbal cueing for proper hand placement.    Ambulation/Gait Ambulation/Gait assistance: Min guard Gait Distance (Feet): 100 Feet Assistive device: Rolling walker (2 wheels) Gait Pattern/deviations: Decreased stance time - right, Decreased weight shift to right, Antalgic, Step-to pattern, Step-through pattern Gait velocity: slow     General Gait Details: decr weight shift to R as appropriate for pt's WB status. able to use step-through gait pattern when cued.   Stairs             Wheelchair Mobility    Modified Rankin (Stroke Patients Only)       Balance Overall balance assessment: Needs assistance Sitting-balance support: Bilateral upper extremity supported, Feet supported Sitting balance-Leahy Scale: Fair     Standing balance support: Bilateral upper extremity supported, Reliant on assistive device for balance Standing balance-Leahy Scale: Fair                              Cognition Arousal/Alertness: Awake/alert Behavior During Therapy: WFL for tasks assessed/performed Overall Cognitive Status: Within Functional Limits for tasks assessed  Exercises      General Comments        Pertinent Vitals/Pain Pain Assessment Pain Assessment: 0-10 Pain Score: 8  Pain Location: R hip Pain Descriptors / Indicators: Burning, Sore, Moaning, Grimacing Pain Intervention(s): Monitored during session, Premedicated  before session, Repositioned, Limited activity within patient's tolerance    Home Living                          Prior Function            PT Goals (current goals can now be found in the care plan section) Acute Rehab PT Goals Patient Stated Goal: to go home PT Goal Formulation: With patient Time For Goal Achievement: 11/08/21 Potential to Achieve Goals: Good Progress towards PT goals: Progressing toward goals    Frequency    BID      PT Plan Current plan remains appropriate    Co-evaluation              AM-PAC PT "6 Clicks" Mobility   Outcome Measure  Help needed turning from your back to your side while in a flat bed without using bedrails?: A Lot Help needed moving from lying on your back to sitting on the side of a flat bed without using bedrails?: A Lot Help needed moving to and from a bed to a chair (including a wheelchair)?: A Little Help needed standing up from a chair using your arms (e.g., wheelchair or bedside chair)?: A Little Help needed to walk in hospital room?: A Little Help needed climbing 3-5 steps with a railing? : A Lot 6 Click Score: 15    End of Session Equipment Utilized During Treatment: Gait belt Activity Tolerance: Patient tolerated treatment well;Patient limited by pain Patient left: in bed;with family/visitor present;with call bell/phone within reach;with SCD's reapplied;with bed alarm set   PT Visit Diagnosis: Muscle weakness (generalized) (M62.81);Difficulty in walking, not elsewhere classified (R26.2);Pain Pain - Right/Left: Right Pain - part of body: Hip     Time: 2505-3976 PT Time Calculation (min) (ACUTE ONLY): 27 min  Charges:  $Gait Training: 8-22 mins $Therapeutic Activity: 8-22 mins                     Glenice Laine MPH, SPT 10/26/21, 2:54 PM

## 2021-10-26 NOTE — Progress Notes (Signed)
  Progress Note   Date: 10/26/2021  Patient Name: Adrian Zavala        MRN#: [id number]  Review of the patient's clinical findings supports the diagnosis of:   Obesity

## 2021-10-26 NOTE — Progress Notes (Signed)
Subjective: 1 Day Post-Op Procedure(s) (LRB): REVISION FEMORAL STEM RIGHT HIP (Right) POD2 Primary right total hip replacement. Patient reports pain as moderate.   Patient is well, and has had no acute complaints or problems Plan is to go Home after hospital stay. Negative for chest pain and shortness of breath Fever: no Gastrointestinal:Negative for nausea and vomiting this morning. States he is passing gas but has not had a BM yet.  Objective: Vital signs in last 24 hours: Temp:  [97.9 F (36.6 C)-98.5 F (36.9 C)] 98.3 F (36.8 C) (08/03 1549) Pulse Rate:  [66-88] 74 (08/03 1549) Resp:  [7-18] 16 (08/03 0728) BP: (107-146)/(54-71) 129/68 (08/03 1549) SpO2:  [97 %-100 %] 98 % (08/03 1549)  Intake/Output from previous day:  Intake/Output Summary (Last 24 hours) at 10/26/2021 1637 Last data filed at 10/26/2021 0639 Gross per 24 hour  Intake 1340 ml  Output 525 ml  Net 815 ml    Intake/Output this shift: No intake/output data recorded.  Labs: Recent Labs    10/25/21 0242 10/26/21 0759  HGB 12.3* 10.7*   Recent Labs    10/25/21 0242 10/26/21 0759  WBC 12.4* 21.8*  RBC 3.81* 3.28*  HCT 36.0* 31.4*  PLT 174 170   Recent Labs    10/25/21 0242 10/26/21 0759  NA 141 142  K 3.9 3.9  CL 108 113*  CO2 24 24  BUN 20 25*  CREATININE 1.20 1.01  GLUCOSE 141* 178*  CALCIUM 9.8 9.2   No results for input(s): "LABPT", "INR" in the last 72 hours.   EXAM General - Patient is Alert, Appropriate, and Oriented Extremity - ABD soft Neurovascular intact Intact pulses distally Dorsiflexion/Plantar flexion intact Incision: dressing C/D/I No cellulitis present Dressing/Incision - clean, dry, no drainage Motor Function - intact, moving foot and toes well on exam.  Abdomen soft with intact bowel sounds this morning.  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   a.) cCTA 05/07/2016: measured 41 mm; b.) TTE 06/10/2017: measured 39 mm; c.) cCTA 08/04/2020: measured 39 mm; d.) TTE 08/15/2020: measured 38 mm   BPH (benign prostatic hyperplasia)    Coronary artery disease    Crohn's disease ([place])    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[place])    a.) exogenous 2/2 prolonged corticosteroid use   Diastolic dysfunction 03/18/8249   a.) TTE 08/15/2020: EF 60-65%, mild MR, mod AoV sclerosis, G1DD   Elevated hemoglobin A1c    Fatty liver disease, nonalcoholic    GERD (gastroesophageal reflux disease)    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[facility])    Heart murmur    Hemorrhoid    Hypercholesteremia    Hypertension    Hypothyroidism    IBS (irritable bowel syndrome)    Inflammatory eye disease    Long term current use of systemic steroids    a.) low dose prednisone   Long-term current use of bisphosphonate    a.) ibandronate   Lower extremity weakness    a.) s/p chemo for Wegener's   Lumbar stenosis    Lung disorder    S/P chemo for Wegener's   Osteopenia    Osteoporosis    Pre-diabetes    RBBB (right bundle branch block)    Seasonal allergies  Wegener's granulomatosis    a.) s/p Tx with cyclophosphamide + MTX; ongoing treatment with chronic corticosteroids    Assessment/Plan: 1 Day Post-Op Procedure(s) (LRB): REVISION FEMORAL STEM RIGHT HIP (Right) Principal Problem:   Status post total hip replacement, right  Estimated body mass index is 31.24 kg/m as calculated from the following:   Height as of this encounter: 5' 6"  (1.676 m).   Weight as of this encounter: 87.8 kg. Advance diet Up with therapy D/C IV fluids when tolerating po intake.  Labs reviewed this AM, WBC up to 21.8, likely stress and prednisone induced.  Hg 10.7 this morning, will re check CBC and BMP tomorrow morning. Up with therapy today. Work on [employer]. Plan for  discharge home at this time, possibly tomorrow pending progress with PT.  DVT Prophylaxis -  Eliquis, SCDs Weight-Bearing as tolerated to right leg  J. Cameron Proud, PA-C [facility] Orthopaedic Surgery 10/26/2021, 4:37 PM

## 2021-10-27 ENCOUNTER — Encounter: Payer: Self-pay | Admitting: Surgery

## 2021-10-27 LAB — BASIC METABOLIC PANEL
Anion gap: 5 (ref 5–15)
BUN: 21 mg/dL (ref 8–23)
CO2: 26 mmol/L (ref 22–32)
Calcium: 9.6 mg/dL (ref 8.9–10.3)
Chloride: 111 mmol/L (ref 98–111)
Creatinine, Ser: 0.93 mg/dL (ref 0.61–1.24)
GFR, Estimated: >60 mL/min (ref >60)
Glucose, Bld: 103 mg/dL — ABNORMAL HIGH (ref 70–99)
Potassium: 3.9 mmol/L (ref 3.5–5.1)
Sodium: 142 mmol/L (ref 135–145)

## 2021-10-27 LAB — CBC
HCT: 31 % — ABNORMAL LOW (ref 39.0–52.0)
Hemoglobin: 10.4 g/dL — ABNORMAL LOW (ref 13.0–17.0)
MCH: 31.7 pg (ref 26.0–34.0)
MCHC: 33.5 g/dL (ref 30.0–36.0)
MCV: 94.5 fL (ref 80.0–100.0)
Platelets: 179 10*3/uL (ref 150–400)
RBC: 3.28 MIL/uL — ABNORMAL LOW (ref 4.22–5.81)
RDW: 13.2 % (ref 11.5–15.5)
WBC: 18.9 10*3/uL — ABNORMAL HIGH (ref 4.0–10.5)
nRBC: 0 % (ref 0.0–0.2)

## 2021-10-27 MED ORDER — TIZANIDINE HCL 4 MG PO TABS
4.0000 mg | ORAL_TABLET | Freq: Three times a day (TID) | ORAL | Status: DC | PRN
  Administered 2021-10-28: 4 mg via ORAL
  Filled 2021-10-27 (×2): qty 1

## 2021-10-27 MED ORDER — TIZANIDINE HCL 4 MG PO TABS: 4.0000 mg | ORAL_TABLET | Freq: Three times a day (TID) | ORAL | 0 refills | Status: DC | PRN

## 2021-10-27 MED ORDER — HYDROMORPHONE HCL 2 MG PO TABS
1.0000 mg | ORAL_TABLET | Freq: Four times a day (QID) | ORAL | Status: DC | PRN
  Administered 2021-10-27 – 2021-10-29 (×5): 2 mg via ORAL
  Filled 2021-10-27 (×5): qty 1

## 2021-10-27 NOTE — Plan of Care (Signed)
  Problem: Education: Goal: Knowledge of General Education information will improve Description: Including pain rating scale, medication(s)/side effects and non-pharmacologic comfort measures Outcome: Progressing   Problem: Health Behavior/Discharge Planning: Goal: Ability to manage health-related needs will improve Outcome: Progressing   Problem: Clinical Measurements: Goal: Ability to maintain clinical measurements within normal limits will improve Outcome: Progressing Goal: Will remain free from infection Outcome: Progressing Goal: Diagnostic test results will improve Outcome: Progressing Goal: Cardiovascular complication will be avoided Outcome: Progressing   Problem: Activity: Goal: Risk for activity intolerance will decrease Outcome: Progressing   Problem: Elimination: Goal: Will not experience complications related to bowel motility Outcome: Progressing Goal: Will not experience complications related to urinary retention Outcome: Progressing   Problem: Pain Managment: Goal: General experience of comfort will improve Outcome: Progressing   Problem: Safety: Goal: Ability to remain free from injury will improve Outcome: Progressing   Problem: Skin Integrity: Goal: Risk for impaired skin integrity will decrease Outcome: Progressing   Problem: Nutrition: Goal: Adequate nutrition will be maintained Outcome: Adequate for Discharge   Problem: Clinical Measurements: Goal: Respiratory complications will improve Outcome: Completed/Met   Problem: Coping: Goal: Level of anxiety will decrease Outcome: Completed/Met

## 2021-10-27 NOTE — Evaluation (Signed)
Occupational Therapy Evaluation Patient Details Name: Adrian Zavala MRN: [id number] DOB: [birth year] Today's Date: 10/27/2021   History of Present Illness 71 y/o male s/p R total hip replacement (posterior approach) 8/1. Pt is now s/p R THA revision following fall in hosp 10/25/21.   Clinical Impression   Pt seen for OT evaluation this date. Pt up in recliner and spouse present. Pt was very pain limited in the past month or so requiring AD for limited mobility and assist from spouse for LB ADL. Pt is eager to improve pain control and return to PLOF including work with less pain and improved safety and independence. Pt currently requires MIN-MOD A for LB ADL tasks, spouse able to provide needed level of assist upon discharge. Pt able to recall 2/3 posterior total hip precautions at start of session requiring 1 verbal cue for 3rd precautions. Pt/spouse instructed in ADL transfers, home/routines modifications, positioning for sleeping, AE/DME for ADL, pet care considerations, falls prevention, and compression stocking mgt. Pt/spouse verbalized understanding. Mobility deferred 2/2 staff in to assess pt and lunch tray delivered. Pt would benefit from additional instruction in self care skills and techniques to help maintain precautions with or without assistive devices to support recall and carryover prior to discharge.      Recommendations for follow up therapy are one component of a multi-disciplinary discharge planning process, led by the attending physician.  Recommendations may be updated based on patient status, additional functional criteria and insurance authorization.   Follow Up Recommendations  No OT follow up    Assistance Recommended at Discharge Intermittent Supervision/Assistance  Patient can return home with the following A little help with walking and/or transfers;A little help with bathing/dressing/bathroom;Assist for transportation;Assistance with cooking/housework;Help with stairs or ramp  for entrance    Functional Status Assessment  Patient has had a recent decline in their functional status and demonstrates the ability to make significant improvements in function in a reasonable and predictable amount of time.  Equipment Recommendations  BSC/3in1    Recommendations for Other Services       Precautions / Restrictions Precautions Precautions: Posterior Hip;Fall Precaution Booklet Issued: Yes (comment) Restrictions Weight Bearing Restrictions: Yes RLE Weight Bearing: Partial weight bearing RLE Partial Weight Bearing Percentage or Pounds: 50%      Mobility Bed Mobility               General bed mobility comments: NT, up in recliner    Transfers                   General transfer comment: deferred, staff in then lunch came      Balance                                           ADL either performed or assessed with clinical judgement   ADL Overall ADL's : Needs assistance/impaired                                       General ADL Comments: Pt currently requires MIN-MOD A for LB ADL tasks, spouse able to provide needed level of assist upon discharge.     Vision         Perception     Praxis      Pertinent Vitals/Pain Pain Assessment Pain Assessment:  0-10 Pain Score: 4  Pain Location: R hip Pain Descriptors / Indicators: Burning, Sore, Moaning, Grimacing Pain Intervention(s): Monitored during session, Repositioned, Ice applied     Hand Dominance     Extremity/Trunk Assessment Upper Extremity Assessment Upper Extremity Assessment: Overall WFL for tasks assessed   Lower Extremity Assessment Lower Extremity Assessment: RLE deficits/detail RLE Deficits / Details: s/p R THA, expected post-op strength/ROM deficits   Cervical / Trunk Assessment Cervical / Trunk Assessment: Normal   Communication Communication Communication: No difficulties   Cognition Arousal/Alertness:  Awake/alert Behavior During Therapy: WFL for tasks assessed/performed Overall Cognitive Status: Within Functional Limits for tasks assessed                                       General Comments       Exercises Other Exercises Other Exercises: Pt/spouse instructed in ADL transfers, home/routines modifications, positioning for sleeping, AE/DME for ADL, pet care considerations, falls prevention, and compression stocking mgt.   Shoulder Instructions      Home Living Family/patient expects to be discharged to:: Private residence Living Arrangements: Spouse/significant other Available Help at Discharge: Family;Available 24 hours/day Type of Home: House Home Access: Stairs to enter [employer] of Steps: 3 +1 Entrance Stairs-Rails: Right;Left Home Layout: One level     Bathroom Shower/Tub: Occupational psychologist: Standard     Home Equipment: Conservation officer, nature (2 wheels);Cane - single point;Wheelchair - Scientist, physiological: Reacher        Prior Functioning/Environment Prior Level of Function : Working/employed             Mobility Comments: over the last month he has had so much pain he was using a w/c ADLs Comments: recent hip pain has necessitated wife was helping him don shoes/socks        OT Problem List: Decreased strength;Decreased range of motion;Pain;Impaired balance (sitting and/or standing);Decreased knowledge of use of DME or AE;Decreased knowledge of precautions      OT Treatment/Interventions: Self-care/ADL training;Therapeutic exercise;Therapeutic activities;DME and/or AE instruction;Patient/family education;Balance training    OT Goals(Current goals can be found in the care plan section) Acute Rehab OT Goals Patient Stated Goal: get better pain control and go home OT Goal Formulation: With patient/family Time For Goal Achievement: 11/10/21 Potential to Achieve Goals: Good ADL Goals Pt Will  Perform Lower Body Dressing: with modified independence;with adaptive equipment;with caregiver independent in assisting;sit to/from stand Pt Will Transfer to Toilet: with modified independence;ambulating (BSC over toilet, LRAD, maintaining precautions) Pt Will Perform Toileting - Clothing Manipulation and hygiene: with caregiver independent in assisting;sit to/from stand Additional ADL Goal #1: Pt will independently instruct family in posterior hip precautions and how to maintain during ADL/transfers.  OT Frequency: Min 2X/week    Co-evaluation              AM-PAC OT "6 Clicks" Daily Activity     Outcome Measure Help from another person eating meals?: None Help from another person taking care of personal grooming?: None Help from another person toileting, which includes using toliet, bedpan, or urinal?: A Little Help from another person bathing (including washing, rinsing, drying)?: A Lot Help from another person to put on and taking off regular upper body clothing?: None Help from another person to put on and taking off regular lower body clothing?: A Lot 6 Click Score: 19   End of Session    Activity  Tolerance: Patient tolerated treatment well Patient left: in chair;with call bell/phone within reach;with family/visitor present  OT Visit Diagnosis: Other abnormalities of gait and mobility (R26.89);Pain Pain - Right/Left: Right Pain - part of body: Hip                Time: 1155-2080 OT Time Calculation (min): 38 min Charges:  OT General Charges $OT Visit: 1 Visit OT Evaluation $OT Eval Moderate Complexity: 1 Mod OT Treatments $Self Care/Home Management : 23-37 mins  Ardeth Perfect., MPH, MS, OTR/L ascom [phone] 10/27/21, 2:26 PM

## 2021-10-27 NOTE — Plan of Care (Signed)
Patient sleeping between care. Aox4. Minimal to moderate pain overnight. Dressing intact with old drainage. No new changes in assessment. Call bell within reach. Bed alarm on.  PLAN OF CARE ONGOING  Problem: Education: Goal: Knowledge of General Education information will improve Description: Including pain rating scale, medication(s)/side effects and non-pharmacologic comfort measures Outcome: Progressing   Problem: Health Behavior/Discharge Planning: Goal: Ability to manage health-related needs will improve Outcome: Progressing   Problem: Clinical Measurements: Goal: Ability to maintain clinical measurements within normal limits will improve Outcome: Progressing Goal: Will remain free from infection Outcome: Progressing Goal: Diagnostic test results will improve Outcome: Progressing Goal: Respiratory complications will improve Outcome: Progressing Goal: Cardiovascular complication will be avoided Outcome: Progressing   Problem: Activity: Goal: Risk for activity intolerance will decrease Outcome: Progressing   Problem: Nutrition: Goal: Adequate nutrition will be maintained Outcome: Progressing   Problem: Coping: Goal: Level of anxiety will decrease Outcome: Progressing   Problem: Elimination: Goal: Will not experience complications related to bowel motility Outcome: Progressing Goal: Will not experience complications related to urinary retention Outcome: Progressing   Problem: Pain Managment: Goal: General experience of comfort will improve Outcome: Progressing   Problem: Safety: Goal: Ability to remain free from injury will improve Outcome: Progressing   Problem: Skin Integrity: Goal: Risk for impaired skin integrity will decrease Outcome: Progressing   Problem: Education: Goal: Knowledge of the prescribed therapeutic regimen will improve Outcome: Progressing Goal: Understanding of discharge needs will improve Outcome: Progressing Goal: Individualized  Educational Video(s) Outcome: Progressing   Problem: Activity: Goal: Ability to avoid complications of mobility impairment will improve Outcome: Progressing Goal: Ability to tolerate increased activity will improve Outcome: Progressing   Problem: Clinical Measurements: Goal: Postoperative complications will be avoided or minimized Outcome: Progressing   Problem: Pain Management: Goal: Pain level will decrease with appropriate interventions Outcome: Progressing   Problem: Skin Integrity: Goal: Will show signs of wound healing Outcome: Progressing

## 2021-10-27 NOTE — Discharge Summary (Addendum)
Physician Discharge Summary  Patient ID: Adrian Zavala MRN: [id number] DOB/AGE: February 13, 1951 71 y.o.  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[place]) 05/09/2015   Colitis 01/03/2015   HLD (hyperlipidemia) 01/03/2015   Essential hypertension 01/03/2015   Eye inflamed 01/03/2015   Hypothyroidism 01/03/2015   Bicuspid aortic valve 01/03/2015   Special screening for malignant neoplasms, colon    Benign neoplasm of sigmoid colon    First degree hemorrhoids    Arthritis, degenerative 06/03/2013    Past Medical History:  Diagnosis Date   Anemia    Aortic atherosclerosis ([place])    Aortic stenosis 06/10/2017   a.) TTE 06/10/2017: EF 60-65%; very mild AS with MPG 5 mmHG; AVA (VTI) = 2.22 cm   Arthritis    Spine, Left Hip, Hands   Ascending aorta dilatation ([place]) 05/07/2016   a.) cCTA 05/07/2016: measured 41 mm; b.) TTE 06/10/2017: measured 39 mm; c.) cCTA 08/04/2020: measured 39 mm; d.) TTE 08/15/2020: measured 38 mm   BPH (benign prostatic hyperplasia)    Coronary artery disease    Crohn's disease ([place])    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[place])    a.) exogenous 2/2 prolonged corticosteroid use   Diastolic dysfunction 88/41/6606   a.) TTE 08/15/2020: EF 60-65%, mild MR, mod AoV  sclerosis, G1DD   Elevated hemoglobin A1c    Fatty liver disease, nonalcoholic    GERD (gastroesophageal reflux disease)    Zavala with polyangiitis ([facility])    Heart murmur    Hemorrhoid    Hypercholesteremia    Hypertension    Hypothyroidism    IBS (irritable bowel syndrome)    Inflammatory eye disease    Long term current use of systemic steroids    a.) low dose prednisone   Long-term current use of bisphosphonate    a.) ibandronate   Lower extremity weakness    a.) s/p chemo for Adrian   Lumbar stenosis    Lung disorder    S/P chemo for Adrian   Osteopenia    Osteoporosis    Pre-diabetes    RBBB (right bundle branch block)    Seasonal allergies    Adrian Zavala    a.) s/p Tx with cyclophosphamide + MTX; ongoing treatment with chronic corticosteroids     Transfusion: None.   Consultants (if any):   Discharged Condition: Improved  Hospital Course: [name] is an 71 y.o. male who was admitted 10/24/2021 with a diagnosis of primary osteoarthritis of the right hip.  On POD1 the patient suffered a fall in the bathroom and suffered a medial calcar fracture and had to undergo a revision of femoral component with cable fixation of medial calcar fracture s/p a right total hip arthroplasty on 10/25/21.  Surgeries: Procedure(s): REVISION FEMORAL STEM RIGHT HIP on 10/25/2021 Patient tolerated the surgery well. Taken to PACU where she was stabilized and then transferred to  the orthopedic floor.  Started on Eliquis 2.65m every 12 hrs. Foot pumps applied bilaterally at 80 mm. Heels elevated on bed with rolled towels. No evidence of DVT. Negative [place]. Physical therapy started on day #1 for gait training and transfer. OT started day #1 for ADL and assisted devices.  Patient was able to work with PT and keep his partial weightbearing status to the right hip, 50% weightbearing to the right leg.  Patient did struggle with stairs, was able to perform stair training  with PT on 10/28/21.  Patient's IV was removed on POD4 following his revision.  Foley was removed on POD1.  Implants:  Zimmer Biomet Arcos 15 x 150 mm revision stem with 70 mm C metaphyseal component and 36 mm ceramic head with +0 mm neck  Initial surgery: Biomet press-fit system with a #13 lateral offset Echo femoral stem, a 52 mm acetabular shell with an E-poly hi-wall liner, and a 36 mm ceramic head with a +3 mm neck.  He was given perioperative antibiotics:  Anti-infectives (From admission, onward)    Start     Dose/Rate Route Frequency Ordered Stop   10/25/21 2230  ceFAZolin (ANCEF) IVPB 2g/100 mL premix        2 g 200 mL/hr over 30 Minutes Intravenous Every 6 hours 10/25/21 2005 10/26/21 1034   10/25/21 1600  ceFAZolin (ANCEF) IVPB 2g/100 mL premix        2 g 200 mL/hr over 30 Minutes Intravenous  Once 10/25/21 1559 10/25/21 1639   10/25/21 1600  ceFAZolin (ANCEF) 2-4 GM/100ML-% IVPB       Note to Pharmacy: MJeanene ErbE: cabinet override      10/25/21 1600 10/26/21 0424   10/24/21 1700  ceFAZolin (ANCEF) IVPB 2g/100 mL premix        2 g 200 mL/hr over 30 Minutes Intravenous Every 6 hours 10/24/21 1449 10/26/21 1115   10/24/21 0852  ceFAZolin (ANCEF) 2-4 GM/100ML-% IVPB       Note to Pharmacy: MJeanene ErbE: cabinet override      10/24/21 0852 10/24/21 1052   10/24/21 0130  ceFAZolin (ANCEF) IVPB 2g/100 mL premix        2 g 200 mL/hr over 30 Minutes Intravenous  Once 10/24/21 0120 10/24/21 1041     .  He was given sequential compression devices, early ambulation, and Eliquis for DVT prophylaxis.  He benefited maximally from the hospital stay and there were no complications.    Recent vital signs:  Vitals:   10/29/21 0513 10/29/21 0721  BP: (!) 151/80 127/61  Pulse: 85 91  Resp: 16 16  Temp: 98.4 F (36.9 C) 98.3 F (36.8 C)  SpO2: 98% 99%    Recent laboratory studies:  Lab Results  Component Value Date   HGB 10.4 (L) 10/27/2021   HGB 10.7 (L)  10/26/2021   HGB 12.3 (L) 10/25/2021   Lab Results  Component Value Date   WBC 18.9 (H) 10/27/2021   PLT 179 10/27/2021   No results found for: "INR" Lab Results  Component Value Date   NA 142 10/27/2021   K 3.9 10/27/2021   CL 111 10/27/2021   CO2 26 10/27/2021   BUN 21 10/27/2021   CREATININE 0.93 10/27/2021   GLUCOSE 103 (H) 10/27/2021    Discharge Medications:   Allergies as of 10/29/2021       Reactions   Sulfamethoxazole-trimethoprim Anaphylaxis   Ampicillin    Keflex [cephalexin]    Penicillins Other (See Comments)  Passes out   Quinolones    Fluoroquinolones not recommended in the setting of aortic dilation   Codeine Itching, Other (See Comments), Rash   tingling Rash and dizziness   Prilosec [omeprazole] Hives, Rash   Tetracyclines & Related Rash        Medication List     STOP taking these medications    acetaminophen 650 MG CR tablet Commonly known as: TYLENOL Replaced by: acetaminophen 500 MG tablet       TAKE these medications    acetaminophen 500 MG tablet Commonly known as: TYLENOL Take 2 tablets (1,000 mg total) by mouth every 6 (six) hours. Replaces: acetaminophen 650 MG CR tablet   amLODipine 10 MG tablet Commonly known as: NORVASC Take 1 tablet (10 mg total) by mouth daily. What changed: when to take this   apixaban 2.5 MG Tabs tablet Commonly known as: ELIQUIS Take 1 tablet (2.5 mg total) by mouth 2 (two) times daily.   aspirin 81 MG tablet Take 81 mg by mouth daily. AM   B-COMPLEX BALANCED PO Take 1 tablet by mouth daily.   calcium carbonate 600 MG Tabs tablet Commonly known as: OS-CAL Take 600 mg by mouth daily. AM   ezetimibe 10 MG tablet Commonly known as: ZETIA TAKE 1 TABLET BY MOUTH  DAILY   famotidine 40 MG tablet Commonly known as: PEPCID TAKE 1 TABLET BY MOUTH  DAILY What changed: when to take this   FIBER PO Take 1 capsule by mouth 2 (two) times daily.   fluticasone 50 MCG/ACT nasal spray Commonly  known as: FLONASE Place 2 sprays into both nostrils daily. Use for 4-6 weeks then stop and use seasonally or as needed. What changed:  when to take this reasons to take this   gabapentin 300 MG capsule Commonly known as: NEURONTIN Take 300 mg by mouth 3 (three) times daily.   HYDROmorphone 2 MG tablet Commonly known as: DILAUDID Take 0.5-1 tablets (1-2 mg total) by mouth every 6 (six) hours as needed for severe pain (Pain score 7-10).   ibandronate 150 MG tablet Commonly known as: BONIVA Take 150 mg by mouth every 30 (thirty) days.   ibuprofen 200 MG tablet Commonly known as: ADVIL Take 200 mg by mouth every 6 (six) hours as needed.   ipratropium 0.06 % nasal spray Commonly known as: ATROVENT Place 2 sprays into both nostrils 4 (four) times daily as needed for rhinitis.   levothyroxine 25 MCG tablet Commonly known as: SYNTHROID TAKE 1 TABLET BY MOUTH  DAILY What changed:  how much to take when to take this   loratadine 10 MG tablet Commonly known as: CLARITIN Take 10 mg by mouth every morning.   magnesium oxide 400 MG tablet Commonly known as: MAG-OX Take 400 mg by mouth 2 (two) times daily.   niacin 500 MG ER tablet Commonly known as: VITAMIN B3 Take 750 mg by mouth at bedtime.   ondansetron 4 MG tablet Commonly known as: ZOFRAN Take 1 tablet (4 mg total) by mouth every 6 (six) hours as needed for nausea.   predniSONE 5 MG tablet Commonly known as: DELTASONE Take 5-20 mg by mouth daily. Depending on flare up of [place]   rosuvastatin 40 MG tablet Commonly known as: CRESTOR Take 1 tablet (40 mg total) by mouth daily. What changed: when to take this   tiZANidine 4 MG tablet Commonly known as: ZANAFLEX Take 1 tablet (4 mg total) by mouth every 8 (eight) hours as needed for muscle spasms.  traMADol 50 MG tablet Commonly known as: ULTRAM Take 1 tablet (50 mg total) by mouth every 6 (six) hours as needed. What changed: when to take this   Vitamin D3 25  MCG (1000 UT) Caps Take 1 capsule by mouth daily.               Durable Medical Equipment  (From admission, onward)           Start     Ordered   10/24/21 1449  DME Bedside commode  Once       Question:  Patient needs a bedside commode to treat with the following condition  Answer:  Status post total hip replacement, right   10/24/21 1449   10/24/21 1449  DME 3 n 1  Once        10/24/21 1449   10/24/21 1449  DME Walker rolling  Once       Question Answer Comment  Walker: With 5 Inch Wheels   Patient needs a walker to treat with the following condition Status post total hip replacement, right      10/24/21 1449           Diagnostic Studies: DG HIP UNILAT W OR W/O PELVIS 2-3 VIEWS RIGHT  Result Date: 10/25/2021 CLINICAL DATA:  Status post right hip revision. EXAM: DG HIP (WITH OR WITHOUT PELVIS) 2-3V RIGHT COMPARISON:  October 25, 2021 (8:40 a.m.) FINDINGS: A total right hip replacement is seen without evidence of surrounding lucency to suggest the presence of hardware loosening. Interval revision of the previously noted right femoral prosthesis is noted with new radiopaque fixation wires seen along the proximal femoral shaft. A mildly displaced fracture of the lesser trochanter is also seen. This represents a new finding. A mild amount of soft tissue air is seen with lateral radiopaque skin staples. IMPRESSION: 1. Interval revision of the previously noted total right hip replacement, as described above. Electronically Signed   By: Virgina Norfolk M.D.   On: 10/25/2021 19:14   DG HIP UNILAT W OR W/O PELVIS 2-3 VIEWS RIGHT  Result Date: 10/25/2021 CLINICAL DATA:  Post total right hip replacement. EXAM: DG HIP (WITH OR WITHOUT PELVIS) 2-3V RIGHT COMPARISON:  Right hip radiographs 10/24/2021 FINDINGS: Status post total right hip arthroplasty. No perihardware lucency is seen to indicate hardware failure or loosening. There is vertically oriented apparent fracture of the junction of  the lesser trochanter and the periprosthetic intertrochanteric region of the proximal right femur. This was not visualized on yesterday's frontal radiograph and is suspicious for a new periprosthetic fracture. No prosthetic displacement is seen. Postoperative subcutaneous air is again seen about the right hip. Lateral right hip surgical skin staples. IMPRESSION: Findings suspicious for vertically oriented and mildly displaced periprosthetic fracture of the lesser trochanteric region just medial to the proximal right femoral stem prosthesis. This appears new compared to yesterday's postoperative radiographs. Recommend clinical correlation. Electronically Signed   By: Yvonne Kendall M.D.   On: 10/25/2021 09:04   DG Pelvis Portable  Result Date: 10/25/2021 CLINICAL DATA:  Postop surgery EXAM: PORTABLE PELVIS 1-2 VIEWS COMPARISON:  None Available. FINDINGS: Total hip arthroplasty. The femoral component and acetabular component are well seated. No fracture or dislocation. Expected postsurgical soft tissue changes IMPRESSION: RIGHT hip total arthroplasty without complication Electronically Signed   By: Suzy Bouchard M.D.   On: 10/25/2021 08:48   DG HIP UNILAT W OR W/O PELVIS 2-3 VIEWS RIGHT  Result Date: 10/24/2021 CLINICAL DATA:  Right total hip  arthroplasty. EXAM: DG HIP (WITH OR WITHOUT PELVIS) 2-3V RIGHT COMPARISON:  MRI 09/14/2021 FINDINGS: Total right hip arthroplasty components are in good position without complicating features. IMPRESSION: Well seated components of a total right hip arthroplasty. Electronically Signed   By: Marijo Sanes M.D.   On: 10/24/2021 13:08    Disposition: Plan for discharge home today, 10/29/21, pending stair training.   Follow-up Information     Lattie Corns, PA-C Follow up in 14 day(s).   Specialty: Physician Assistant Why: Electa Sniff information: [place] [address] [phone]                Signed: Judson Roch PA-C 10/29/2021, 7:55 AM

## 2021-10-27 NOTE — Progress Notes (Addendum)
Physical Therapy Treatment Patient Details Name: Adrian Zavala MRN: [id number] DOB: 1950/12/06 Today's Date: 10/27/2021   History of Present Illness 72 y/o male s/p R total hip replacement (posterior approach) 8/1. Pt is now s/p R THA revision following fall in hosp 10/25/21.    PT Comments    Pt presents to PT in recliner and agreeable to participate in therapy services. Initially reports 0/10 pain prior to start of tx, incr to 8/10 w movement. Pt requires CGA to perform STS, amb, and stair negotiation, and minA to perform sit to sup. Pt required less verbal cuing for proper hand placement on AD than previous sessions. Pt able to recall and demonstrate compliance w 3/3 posterior hip precautions during this afternoon's session. Able to ascend and descend 4 stairs w CGA and 1 handrail maintaining all posterior hip and weight bearing precautions, a bit more effortful than this AM session. Pt would continue to benefit from skilled PT to address deficits outlined and return to PLOF. Current recommendation of HHPT w frequent supervision remains appropriate.      Recommendations for follow up therapy are one component of a multi-disciplinary discharge planning process, led by the attending physician.  Recommendations may be updated based on patient status, additional functional criteria and insurance authorization.  Follow Up Recommendations  Home health PT     Assistance Recommended at Discharge Frequent or constant Supervision/Assistance  Patient can return home with the following A little help with bathing/dressing/bathroom;Assistance with cooking/housework;Assist for transportation;A little help with walking and/or transfers;Help with stairs or ramp for entrance;Direct supervision/assist for medications management   Equipment Recommendations  BSC/3in1;Rolling walker (2 wheels)    Recommendations for Other Services       Precautions / Restrictions Precautions Precautions: Posterior  Hip;Fall Precaution Booklet Issued: Yes (comment) Restrictions Weight Bearing Restrictions: Yes RLE Weight Bearing: Partial weight bearing RLE Partial Weight Bearing Percentage or Pounds: 50%     Mobility  Bed Mobility Overal bed mobility: Needs Assistance Bed Mobility: Supine to Sit     Supine to sit: Mod assist Sit to supine: Min assist   General bed mobility comments: cues to use bed rails to assist, modA for RLE    Transfers Overall transfer level: Needs assistance Equipment used: Rolling walker (2 wheels) Transfers: Sit to/from Stand Sit to Stand: Min guard           General transfer comment: CGA for STS. min verbal cueing for proper hand placement on RW    Ambulation/Gait Ambulation/Gait assistance: Min guard Gait Distance (Feet): 150 Feet Assistive device: Rolling walker (2 wheels) Gait Pattern/deviations: Decreased stance time - right, Decreased weight shift to right, Antalgic, Step-to pattern, Step-through pattern Gait velocity: slow     General Gait Details: decr weight shift to R as appropriate for pt's WB status.   Stairs Stairs: Yes Stairs assistance: Min guard Stair Management: Step to pattern, One rail Right Number of Stairs: 4 General stair comments: up and down 4 STE w CGA. maintained posterior hip precautions and PWB status   Wheelchair Mobility    Modified Rankin (Stroke Patients Only)       Balance Overall balance assessment: Needs assistance Sitting-balance support: Bilateral upper extremity supported, Feet supported Sitting balance-Leahy Scale: Fair     Standing balance support: Bilateral upper extremity supported, Reliant on assistive device for balance Standing balance-Leahy Scale: Fair  Cognition Arousal/Alertness: Awake/alert Behavior During Therapy: WFL for tasks assessed/performed Overall Cognitive Status: Within Functional Limits for tasks assessed                                           Exercises      General Comments        Pertinent Vitals/Pain Pain Assessment Pain Assessment: 0-10 Pain Score: 8  Pain Location: R hip Pain Descriptors / Indicators: Burning, Sore, Moaning, Grimacing Pain Intervention(s): Limited activity within patient's tolerance, Monitored during session, Premedicated before session, Repositioned    Home Living Family/patient expects to be discharged to:: Private residence Living Arrangements: Spouse/significant other Available Help at Discharge: Family;Available 24 hours/day Type of Home: House Home Access: Stairs to enter Entrance Stairs-Rails: Psychiatric nurse of Steps: 3 +1   Home Layout: One level Home Equipment: Conservation officer, nature (2 wheels);Cane - single point;Wheelchair - Psychologist, educational      Prior Function            PT Goals (current goals can now be found in the care plan section) Acute Rehab PT Goals Patient Stated Goal: to go home PT Goal Formulation: With patient Time For Goal Achievement: 11/09/21 Potential to Achieve Goals: Good Progress towards PT goals: Progressing toward goals    Frequency    BID      PT Plan Current plan remains appropriate    Co-evaluation              AM-PAC PT "6 Clicks" Mobility   Outcome Measure  Help needed turning from your back to your side while in a flat bed without using bedrails?: A Lot Help needed moving from lying on your back to sitting on the side of a flat bed without using bedrails?: A Lot Help needed moving to and from a bed to a chair (including a wheelchair)?: A Little Help needed standing up from a chair using your arms (e.g., wheelchair or bedside chair)?: A Little Help needed to walk in hospital room?: A Little Help needed climbing 3-5 steps with a railing? : A Little 6 Click Score: 16    End of Session Equipment Utilized During Treatment: Gait belt Activity Tolerance: Patient tolerated  treatment well;Patient limited by pain Patient left: in bed;with family/visitor present;with call bell/phone within reach;with bed alarm set;with SCD's reapplied Nurse Communication: Mobility status PT Visit Diagnosis: Muscle weakness (generalized) (M62.81);Difficulty in walking, not elsewhere classified (R26.2);Pain Pain - Right/Left: Right Pain - part of body: Hip     Time: 2336-1224 PT Time Calculation (min) (ACUTE ONLY): 37 min  Charges:  $Gait Training: 8-22 mins $Therapeutic Exercise: 8-22 mins $Therapeutic Activity: 8-22 mins                    Glenice Laine MPH, SPT 10/27/21, 2:19 PM  This entire session was performed under direct supervision and direction of a licensed therapist. I have personally read, edited and approve of the note as written.   Lieutenant Diego PT, DPT 2:35 PM,10/27/21

## 2021-10-27 NOTE — Care Management Important Message (Signed)
Important Message  Patient Details  Name: Adrian Zavala MRN: [id number] Date of Birth: 04-16-1950   Medicare Important Message Given:  N/A - LOS <3 / Initial given by admissions     Juliann Pulse A Shawne Bulow 10/27/2021, 9:03 AM

## 2021-10-27 NOTE — Progress Notes (Signed)
Subjective: 2 Days Post-Op Procedure(s) (LRB): REVISION FEMORAL STEM RIGHT HIP (Right) POD3 Primary right total hip replacement. Patient reports pain as moderate.   Patient is well, and has had no acute complaints or problems Plan is to go Home after hospital stay.  Was able to walk 100 feet with PT yesterday. Negative for chest pain and shortness of breath Fever: no Gastrointestinal:Negative for nausea and vomiting this morning. States he is passing gas but has not had a BM yet.  Objective: Vital signs in last 24 hours: Temp:  [98 F (36.7 C)-98.5 F (36.9 C)] 98.5 F (36.9 C) (08/04 0509) Pulse Rate:  [69-88] 84 (08/04 0509) Resp:  [20] 20 (08/04 0509) BP: (129-147)/(55-77) 147/77 (08/04 0509) SpO2:  [96 %-100 %] 97 % (08/04 0509)  Intake/Output from previous day:  Intake/Output Summary (Last 24 hours) at 10/27/2021 0753 Last data filed at 10/27/2021 0650 Gross per 24 hour  Intake 100 ml  Output 2000 ml  Net -1900 ml    Intake/Output this shift: No intake/output data recorded.  Labs: Recent Labs    10/25/21 0242 10/26/21 0759  HGB 12.3* 10.7*   Recent Labs    10/25/21 0242 10/26/21 0759  WBC 12.4* 21.8*  RBC 3.81* 3.28*  HCT 36.0* 31.4*  PLT 174 170   Recent Labs    10/25/21 0242 10/26/21 0759  NA 141 142  K 3.9 3.9  CL 108 113*  CO2 24 24  BUN 20 25*  CREATININE 1.20 1.01  GLUCOSE 141* 178*  CALCIUM 9.8 9.2   No results for input(s): "LABPT", "INR" in the last 72 hours.   EXAM General - Patient is Alert, Appropriate, and Oriented Extremity - ABD soft Neurovascular intact Intact pulses distally Dorsiflexion/Plantar flexion intact Incision: scant drainage No cellulitis present Dressing/Incision - Mild bloody drainage noted to the right hip. Motor Function - intact, moving foot and toes well on exam.  Bowel sounds this morning.  Mild distention with tympany this morning.  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   a.) cCTA 05/07/2016: measured 41 mm; b.) TTE 06/10/2017: measured 39 mm; c.) cCTA 08/04/2020: measured 39 mm; d.) TTE 08/15/2020: measured 38 mm   BPH (benign prostatic hyperplasia)    Coronary artery disease    Crohn's disease ([place])    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[place])    a.) exogenous 2/2 prolonged corticosteroid use   Diastolic dysfunction 95/18/8416   a.) TTE 08/15/2020: EF 60-65%, mild MR, mod AoV sclerosis, G1DD   Elevated hemoglobin A1c    Fatty liver disease, nonalcoholic    GERD (gastroesophageal reflux disease)    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[facility])    Heart murmur    Hemorrhoid    Hypercholesteremia    Hypertension    Hypothyroidism    IBS (irritable bowel syndrome)    Inflammatory eye disease    Long term current use of systemic steroids    a.) low dose prednisone   Long-term current use of bisphosphonate    a.) ibandronate   Lower extremity weakness    a.) s/p chemo for Wegener's   Lumbar stenosis    Lung disorder    S/P chemo for Wegener's   Osteopenia    Osteoporosis  Pre-diabetes    RBBB (right bundle branch block)    Seasonal allergies    Wegener's granulomatosis    a.) s/p Tx with cyclophosphamide + MTX; ongoing treatment with chronic corticosteroids    Assessment/Plan: 2 Days Post-Op Procedure(s) (LRB): REVISION FEMORAL STEM RIGHT HIP (Right) Principal Problem:   Status post total hip replacement, right  Estimated body mass index is 31.24 kg/m as calculated from the following:   Height as of this encounter: 5' 6"  (1.676 m).   Weight as of this encounter: 87.8 kg. Advance diet Up with therapy D/C IV fluids when tolerating po intake.  Labs pending this morning. Muscle relaxer added to pain regimen. Up with therapy today.  Needs  to clear stairs prior to discharge. Work on [employer].  Patient states he does not have a BM daily, he is passing gas this morning and denies any abdominal pain, N/V.  Can move to FLEET enema today if needed. Plan for discharge home this afternoon pending progress with PT and stair training.  Plan for discharge home at this time, possibly tomorrow pending progress with PT.  DVT Prophylaxis -  Eliquis, SCDs Weight-Bearing as tolerated to right leg  J. Cameron Proud, PA-C [facility] Orthopaedic Surgery 10/27/2021, 7:53 AM

## 2021-10-27 NOTE — Progress Notes (Addendum)
Physical Therapy Treatment Patient Details Name: Adrian Zavala MRN: [id number] DOB: 05/22/1950 Today's Date: 10/27/2021   History of Present Illness 71 y/o male s/p R total hip replacement (posterior approach) 8/1. Pt is now s/p R THA revision following fall in hosp 10/25/21.    PT Comments    Pt presents to PT in bed and agreeable to participate in therapy services. Pt reports 9/10 pain. Pt requires modA to perform sup<>sit and CGA for STS, amb, and stair negotiation. Pt still requiring verbal cueing for proper hand placement w AD use. Pt able to amb 80 ft w chair follow, RW, and CGA. Able to ascend/descend 4 stairs w CGA, maintaining post hip precautions and PWB status. Pt able to verbalize and demonstrate proper stair negotiation technique. Pt only able to recall 1/3 posterior hip precautions, re-educated. Pt will continue to benefit from skilled PT to address above deficits and promote optimal return to PLOF.     Recommendations for follow up therapy are one component of a multi-disciplinary discharge planning process, led by the attending physician.  Recommendations may be updated based on patient status, additional functional criteria and insurance authorization.  Follow Up Recommendations  Home health PT     Assistance Recommended at Discharge Frequent or constant Supervision/Assistance  Patient can return home with the following A little help with bathing/dressing/bathroom;Assistance with cooking/housework;Assist for transportation;A little help with walking and/or transfers;Help with stairs or ramp for entrance;Direct supervision/assist for medications management   Equipment Recommendations  BSC/3in1;Rolling walker (2 wheels)    Recommendations for Other Services       Precautions / Restrictions Precautions Precautions: Posterior Hip Precaution Booklet Issued: Yes (comment) Restrictions Weight Bearing Restrictions: Yes RLE Weight Bearing: Partial weight bearing RLE Partial  Weight Bearing Percentage or Pounds: 50%     Mobility  Bed Mobility Overal bed mobility: Needs Assistance Bed Mobility: Supine to Sit     Supine to sit: Mod assist     General bed mobility comments: cues to use bed rails to assist, modA for RLE    Transfers Overall transfer level: Needs assistance Equipment used: Rolling walker (2 wheels) Transfers: Sit to/from Stand Sit to Stand: Min guard           General transfer comment: CGA for STS. min verbal cueing for proper hand placement.    Ambulation/Gait Ambulation/Gait assistance: Min guard Gait Distance (Feet): 80 Feet Assistive device: Rolling walker (2 wheels) Gait Pattern/deviations: Decreased stance time - right, Decreased weight shift to right, Antalgic, Step-to pattern, Step-through pattern Gait velocity: slow     General Gait Details: decr weight shift to R as appropriate for pt's WB status. able to use step-through gait pattern when cued.   Stairs Stairs: Yes Stairs assistance: Min guard, +2 safety/equipment Stair Management: Step to pattern, Two rails Number of Stairs: 4 General stair comments: up and down 4 STE w CGA. maintained posterior hip precautions and PWB status   Wheelchair Mobility    Modified Rankin (Stroke Patients Only)       Balance Overall balance assessment: Needs assistance Sitting-balance support: Bilateral upper extremity supported, Feet supported Sitting balance-Leahy Scale: Fair     Standing balance support: Bilateral upper extremity supported, Reliant on assistive device for balance Standing balance-Leahy Scale: Fair                              Cognition Arousal/Alertness: Awake/alert Behavior During Therapy: WFL for tasks assessed/performed Overall Cognitive Status: Within  Functional Limits for tasks assessed                                          Exercises      General Comments        Pertinent Vitals/Pain Pain  Assessment Pain Assessment: 0-10 Pain Score: 9  Breathing: normal Negative Vocalization: occasional moan/groan, low speech, negative/disapproving quality Facial Expression: smiling or inexpressive Body Language: relaxed Consolability: no need to console PAINAD Score: 1 Pain Location: R hip Pain Descriptors / Indicators: Burning, Sore, Moaning, Grimacing Pain Intervention(s): Monitored during session, Premedicated before session, Repositioned, Limited activity within patient's tolerance    Home Living                          Prior Function            PT Goals (current goals can now be found in the care plan section) Acute Rehab PT Goals Patient Stated Goal: to go home PT Goal Formulation: With patient Time For Goal Achievement: 11/09/21 Potential to Achieve Goals: Good Progress towards PT goals: Progressing toward goals    Frequency    BID      PT Plan Current plan remains appropriate    Co-evaluation              AM-PAC PT "6 Clicks" Mobility   Outcome Measure  Help needed turning from your back to your side while in a flat bed without using bedrails?: A Lot Help needed moving from lying on your back to sitting on the side of a flat bed without using bedrails?: A Lot Help needed moving to and from a bed to a chair (including a wheelchair)?: A Little Help needed standing up from a chair using your arms (e.g., wheelchair or bedside chair)?: A Little Help needed to walk in hospital room?: A Little Help needed climbing 3-5 steps with a railing? : A Little 6 Click Score: 16    End of Session Equipment Utilized During Treatment: Gait belt Activity Tolerance: Patient tolerated treatment well;Patient limited by pain Patient left: in bed;with chair alarm set;in chair;with family/visitor present;with call bell/phone within reach;with SCD's reapplied Nurse Communication: Mobility status PT Visit Diagnosis: Muscle weakness (generalized) (M62.81);Difficulty  in walking, not elsewhere classified (R26.2);Pain Pain - Right/Left: Right Pain - part of body: Hip     Time: 5945-8592 PT Time Calculation (min) (ACUTE ONLY): 39 min  Charges:                        Glenice Laine MPH, SPT 10/27/21, 11:04 AM

## 2021-10-28 MED ORDER — ACETAMINOPHEN 500 MG PO TABS
500.0000 mg | ORAL_TABLET | Freq: Four times a day (QID) | ORAL | Status: DC
  Administered 2021-10-28 – 2021-10-29 (×4): 1000 mg via ORAL
  Filled 2021-10-28 (×5): qty 2

## 2021-10-28 MED ORDER — HYDROMORPHONE HCL 2 MG PO TABS
1.0000 mg | ORAL_TABLET | Freq: Four times a day (QID) | ORAL | 0 refills | Status: DC | PRN
Start: 2021-10-28 — End: 2022-08-29

## 2021-10-28 NOTE — Progress Notes (Signed)
Physical Therapy Treatment Patient Details Name: Adrian Zavala MRN: [id number] DOB: [birth year] Today's Date: 10/28/2021   History of Present Illness Adrian Zavala is a 76yoM who comes to [facility] 10/24/21 for elective Rt THA (posterolateral) with Dr. Milagros Evener. Pt sustained a pop in operative thigh and fall, foudn to have perprostheic fracture on POD1, taken back to OR with Dr. Roland Rack 8/2 for THA revision, made PWB thereafter. KCL:EXNT, hypoTSH, HTN, nonalcoholic fatty liver disease, granulomatosis with polyangitis.    PT Comments    Author returns to explore additional stairs navigation techniques this time- goal to improve ergonomics, safety, and independence. AMB needs minimized in session due to historically poor tolerance and need to optimize capabilities for stairs training. Pt has a WC at home which can be used in the acute heeling phase to optimize safety and pain control. Pt demos excellent potential for BUE strength in backward step training, then in stairwell is able to perform 6 stairs with less than half the effort used in previous technique. Wife in attendance for training session. HHPT still not an option (insurance limitations) and AMB very limited by pain. I think STR/SNF is the most reasonable option to help patient advance his independence and safe tolerance of household distance AMB. We now have a realistic option for home entry, but pt would not have any PT options for a couple weeks until he could start OPPT. Discussed with careteam, ortho and patient/spouse.     Recommendations for follow up therapy are one component of a multi-disciplinary discharge planning process, led by the attending physician.  Recommendations may be updated based on patient status, additional functional criteria and insurance authorization.  Follow Up Recommendations  Skilled nursing-short term rehab (<3 hours/day)     Assistance Recommended at Discharge Set up Supervision/Assistance  Patient can return  home with the following A little help with bathing/dressing/bathroom;Assistance with cooking/housework;Assist for transportation;Help with stairs or ramp for entrance;A lot of help with walking and/or transfers   Equipment Recommendations  BSC/3in1;Rolling walker (2 wheels)    Recommendations for Other Services       Precautions / Restrictions Precautions Precautions: Posterior Hip;Fall Precaution Booklet Issued: Yes (comment) Restrictions RLE Weight Bearing: Partial weight bearing RLE Partial Weight Bearing Percentage or Pounds: 50%     Mobility  Bed Mobility Overal bed mobility: Needs Assistance Bed Mobility: Supine to Sit     Supine to sit: Min guard, HOB elevated     General bed mobility comments: In recliner at start and end of session    Transfers Overall transfer level: Needs assistance Equipment used: Rolling walker (2 wheels) Transfers: Sit to/from Stand Sit to Stand: Supervision           General transfer comment: several opportunities for supervision level transfers in session, moving away from minGuard    Ambulation/Gait Ambulation/Gait assistance: Min guard Gait Distance (Feet): 25 Feet (additional distances deferred as not to elevate pain and detract from stairs training tolerance.) Assistive device: Rolling walker (2 wheels) Gait Pattern/deviations: WFL(Within Functional Limits)       General Gait Details: pain rapidly progresses with gait, despite good maintaining of PWB status, also ergonomically challenging to cardiorespiratory system, repeat stops for recovery after 24f and decomposing motor control of RLE in gait; author brings chair to patient for safety.   Stairs Stairs: Yes Stairs assistance: Min guard Stair Management: No rails, Backwards, With walker Number of Stairs: 9 General stair comments: *see exercise section for detail        Exercises  Other Exercises Other Exercises: stairs training: 8-inch portable step in  room, 3 times up/down backwards with BUE on RW, up with good, down with bad, PWB maintained; good control/low fatigue of arms (one shoe left) Other Exercises: stairs training in stairwell: 4 steps backwards c RW, no rails, 1 shoe left, up c good, down c bad, front/back RW poles on a 2 hole differential to allow for improve RW stability on a decline. Other Exercises: stairs training in stairwell: 2 steps backwards c RW, no rails, 1 shoe left, up c good, down c bad, front/back RW poles on a 2 hole differential to allow for improve RW stability on a decline. (short standing break between early 4 and 2 more) Other Exercises: education on posterior walker pole adjustment by 2 pin holes, migration of hands to behind grips on RW, education to wife on mild posterior force on walker as needed to prevent forward rotation. Other Exercises: sent a video link of technique for review            Cognition Arousal/Alertness: Awake/alert Behavior During Therapy: WFL for tasks assessed/performed Overall Cognitive Status: Within Functional Limits for tasks assessed             General Comments        Pertinent Vitals/Pain Pain Assessment Pain Assessment: 0-10 Pain Score: 9     Home Living                          Prior Function            PT Goals (current goals can now be found in the care plan section) Acute Rehab PT Goals Patient Stated Goal: to go home PT Goal Formulation: With patient Time For Goal Achievement: 11/09/21 Potential to Achieve Goals: Fair Progress towards PT goals: Not progressing toward goals - comment    Frequency    BID      PT Plan Discharge plan needs to be updated    Co-evaluation              AM-PAC PT "6 Clicks" Mobility   Outcome Measure  Help needed turning from your back to your side while in a flat bed without using bedrails?: A Little Help needed moving from lying on your back to sitting on the side of a flat bed without using  bedrails?: A Little Help needed moving to and from a bed to a chair (including a wheelchair)?: A Little Help needed standing up from a chair using your arms (e.g., wheelchair or bedside chair)?: A Little Help needed to walk in hospital room?: A Lot Help needed climbing 3-5 steps with a railing? : A Lot 6 Click Score: 16    End of Session Equipment Utilized During Treatment: Gait belt Activity Tolerance: Patient limited by pain;Patient limited by fatigue Patient left: in bed;with family/visitor present;with call bell/phone within reach;in chair Nurse Communication: Mobility status PT Visit Diagnosis: Muscle weakness (generalized) (M62.81);Difficulty in walking, not elsewhere classified (R26.2);Pain Pain - Right/Left: Right Pain - part of body: Hip     Time: 1210-1240 PT Time Calculation (min) (ACUTE ONLY): 30 min  Charges:  $Gait Training: 23-37 mins                     2:23 PM, 10/28/21 Etta Grandchild, PT, DPT Physical Therapist - [facility]  [phone] ([place])     Mona C 10/28/2021, 2:17 PM

## 2021-10-28 NOTE — Progress Notes (Signed)
Physical Therapy Treatment Patient Details Name: Adrian Zavala MRN: [id number] DOB: 13-Feb-1951 Today's Date: 10/28/2021   History of Present Illness Adrian Zavala is a 15yoM who comes to [facility] 10/24/21 for elective Rt THA (posterolateral) with Dr. Milagros Evener. Pt sustained a pop in operative thigh and fall, foudn to have perprostheic fracture on POD1, taken back to OR with Dr. Roland Rack 8/2 for THA revision, made PWB thereafter. YBO:FBPZ, hypoTSH, HTN, nonalcoholic fatty liver disease, granulomatosis with polyangitis.    PT Comments    Author coordinates session with analgesia given role of pain as a limiter in prior sessions. Pain well controlled at start, but rapidly progresses with AMB despite use of RW and 50% PWB. Increased metabolic needs of PWB gait with BUE on RW is very taxing pt limited by elevated HR and RR. AMB distance is half of previous day, remains limited from a household standpoint. Bed mobility and transfers more independent today comparably. Pt and spouse ask for another stairs training opportunity to build confidence for home DC< however a decrease in performance today raises questions about safety of DC to home, particularly in the absence of any available HHPT services. Will return later to take a new approach with stairs.     Recommendations for follow up therapy are one component of a multi-disciplinary discharge planning process, led by the attending physician.  Recommendations may be updated based on patient status, additional functional criteria and insurance authorization.  Follow Up Recommendations  Skilled nursing-short term rehab (<3 hours/day)     Assistance Recommended at Discharge Set up Supervision/Assistance  Patient can return home with the following A little help with bathing/dressing/bathroom;Assistance with cooking/housework;Assist for transportation;Help with stairs or ramp for entrance;A lot of help with walking and/or transfers   Equipment Recommendations   BSC/3in1;Rolling walker (2 wheels)    Recommendations for Other Services       Precautions / Restrictions Precautions Precautions: Posterior Hip;Fall Precaution Booklet Issued: Yes (comment) Restrictions RLE Weight Bearing: Partial weight bearing RLE Partial Weight Bearing Percentage or Pounds: 50%     Mobility  Bed Mobility Overal bed mobility: Needs Assistance Bed Mobility: Supine to Sit     Supine to sit: Min guard, HOB elevated     General bed mobility comments: heavy effort, but performs without any physical asssit; reminded pt/wife of crossing precuations with OOB    Transfers Overall transfer level: Needs assistance Equipment used: Rolling walker (2 wheels) Transfers: Sit to/from Stand Sit to Stand: Supervision           General transfer comment: cues and recs given for safe technique to pt/spouse    Ambulation/Gait Ambulation/Gait assistance: Min guard Gait Distance (Feet): 60 Feet Assistive device: Rolling walker (2 wheels)         General Gait Details: pain rapidly progresses with gait, despite good maintaining of PWB status, also ergonomically challenging to cardiorespiratory system, repeat stops for recovery after 92f and decomposing motor control of RLE in gait; author brings chair to patient for safety.   Stairs Stairs: Yes Stairs assistance: Min assist Stair Management: Step to pattern, One rail Right Number of Stairs: 3 General stair comments: 3 steps, 2 hands Rt rail pulling self; poor foot clearance on 3rd step, becomign risky and exhaustive with heavy dyspnea. performance decline since previous day, does not appear safe for home DC   Wheelchair Mobility    Modified Rankin (Stroke Patients Only)       Balance  Cognition Arousal/Alertness: Awake/alert Behavior During Therapy: WFL for tasks assessed/performed Overall Cognitive Status: Within Functional Limits for tasks  assessed                                          Exercises      General Comments        Pertinent Vitals/Pain Pain Assessment Pain Assessment: 0-10 Pain Score: 9     Home Living                          Prior Function            PT Goals (current goals can now be found in the care plan section) Acute Rehab PT Goals Patient Stated Goal: to go home PT Goal Formulation: With patient Time For Goal Achievement: 11/09/21 Potential to Achieve Goals: Fair Progress towards PT goals: Not progressing toward goals - comment    Frequency    BID      PT Plan Current plan remains appropriate    Co-evaluation              AM-PAC PT "6 Clicks" Mobility   Outcome Measure  Help needed turning from your back to your side while in a flat bed without using bedrails?: A Little Help needed moving from lying on your back to sitting on the side of a flat bed without using bedrails?: A Little Help needed moving to and from a bed to a chair (including a wheelchair)?: A Little Help needed standing up from a chair using your arms (e.g., wheelchair or bedside chair)?: A Little Help needed to walk in hospital room?: A Lot Help needed climbing 3-5 steps with a railing? : A Lot 6 Click Score: 16    End of Session Equipment Utilized During Treatment: Gait belt Activity Tolerance: Patient limited by pain;Patient limited by fatigue Patient left: in bed;with family/visitor present;with call bell/phone within reach;in chair Nurse Communication: Mobility status PT Visit Diagnosis: Muscle weakness (generalized) (M62.81);Difficulty in walking, not elsewhere classified (R26.2);Pain Pain - Right/Left: Right Pain - part of body: Hip     Time: 1031-1110 PT Time Calculation (min) (ACUTE ONLY): 39 min  Charges:  $Gait Training: 38-52 mins                    2:09 PM, 10/28/21 Etta Grandchild, PT, DPT Physical Therapist - [facility]  [phone] ([place])    Bruce C 10/28/2021, 2:05 PM

## 2021-10-28 NOTE — Plan of Care (Signed)
Patient sleeping between care. Minimal pain overnight. Hip dressing intact. No new changes in assessment. Call bell within reach. Bed alarm on.  PLAN OF CARE ONGOING  Problem: Education: Goal: Knowledge of General Education information will improve Description: Including pain rating scale, medication(s)/side effects and non-pharmacologic comfort measures Outcome: Progressing   Problem: Health Behavior/Discharge Planning: Goal: Ability to manage health-related needs will improve Outcome: Progressing   Problem: Clinical Measurements: Goal: Ability to maintain clinical measurements within normal limits will improve Outcome: Progressing Goal: Will remain free from infection Outcome: Progressing Goal: Diagnostic test results will improve Outcome: Progressing Goal: Cardiovascular complication will be avoided Outcome: Progressing   Problem: Activity: Goal: Risk for activity intolerance will decrease Outcome: Progressing   Problem: Nutrition: Goal: Adequate nutrition will be maintained Outcome: Progressing   Problem: Elimination: Goal: Will not experience complications related to bowel motility Outcome: Progressing Goal: Will not experience complications related to urinary retention Outcome: Progressing   Problem: Pain Managment: Goal: General experience of comfort will improve Outcome: Progressing   Problem: Safety: Goal: Ability to remain free from injury will improve Outcome: Progressing   Problem: Skin Integrity: Goal: Risk for impaired skin integrity will decrease Outcome: Progressing   Problem: Education: Goal: Knowledge of the prescribed therapeutic regimen will improve Outcome: Progressing Goal: Understanding of discharge needs will improve Outcome: Progressing Goal: Individualized Educational Video(s) Outcome: Progressing   Problem: Activity: Goal: Ability to avoid complications of mobility impairment will improve Outcome: Progressing Goal: Ability to  tolerate increased activity will improve Outcome: Progressing   Problem: Clinical Measurements: Goal: Postoperative complications will be avoided or minimized Outcome: Progressing   Problem: Pain Management: Goal: Pain level will decrease with appropriate interventions Outcome: Progressing   Problem: Skin Integrity: Goal: Will show signs of wound healing Outcome: Progressing

## 2021-10-28 NOTE — Progress Notes (Signed)
Subjective: 3 Days Post-Op Procedure(s) (LRB): REVISION FEMORAL STEM RIGHT HIP (Right) POD3 Primary right total hip replacement. Patient reports pain as mild.  Patient received dilaudid last night and then 40m of oxycodone this morning.  Wife reporting that he is loopy this morning. Patient is well, and has had no acute complaints or problems Plan is to go Home after hospital stay.  Is performing well with therapy but is having significant pain in the hip when doing so, controlled better with the dilaudid yesterday. Negative for chest pain and shortness of breath Fever: no Gastrointestinal:Negative for nausea and vomiting this morning. He has had a BM.  Objective: Vital signs in last 24 hours: Temp:  [98 F (36.7 C)-98.6 F (37 C)] 98.6 F (37 C) (08/05 0448) Pulse Rate:  [81-99] 81 (08/05 0448) Resp:  [15-17] 17 (08/05 0448) BP: (120-136)/(57-66) 127/61 (08/05 0448) SpO2:  [95 %-100 %] 96 % (08/05 0448)  Intake/Output from previous day:  Intake/Output Summary (Last 24 hours) at 10/28/2021 0807 Last data filed at 10/28/2021 06759Gross per 24 hour  Intake 440 ml  Output 1300 ml  Net -860 ml    Intake/Output this shift: Total I/O In: -  Out: 400 [Urine:400]  Labs: Recent Labs    10/26/21 0759 10/27/21 0755  HGB 10.7* 10.4*   Recent Labs    10/26/21 0759 10/27/21 0755  WBC 21.8* 18.9*  RBC 3.28* 3.28*  HCT 31.4* 31.0*  PLT 170 179   Recent Labs    10/26/21 0759 10/27/21 0755  NA 142 142  K 3.9 3.9  CL 113* 111  CO2 24 26  BUN 25* 21  CREATININE 1.01 0.93  GLUCOSE 178* 103*  CALCIUM 9.2 9.6   No results for input(s): "LABPT", "INR" in the last 72 hours.   EXAM General - Patient is Alert, Appropriate, and Oriented Extremity - ABD soft Neurovascular intact Intact pulses distally Dorsiflexion/Plantar flexion intact Incision: moderate drainage No cellulitis present Dressing/Incision - Bloody drainage noted to the right hip. Motor Function - intact,  moving foot and toes well on exam.  Bowel sounds this morning, abdomen soft to palpation.  Past Medical History:  Diagnosis Date   Anemia    [place]    Aortic stenosis 06/10/2017   a.) TTE 06/10/2017: EF 60-65%; very mild AS with MPG 5 mmHG; AVA (VTI) = 2.22 cm   Arthritis    Spine, Left Hip, Hands   Ascending aorta dilatation ([facility] 05/07/2016   a.) cCTA 05/07/2016: measured 41 mm; b.) TTE 06/10/2017: measured 39 mm; c.) cCTA 08/04/2020: measured 39 mm; d.) TTE 08/15/2020: measured 38 mm   BPH (benign prostatic hyperplasia)    Coronary artery disease    Crohn's disease ([facility]    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    a.) exogenous 2/2 prolonged corticosteroid use   Diastolic dysfunction 016/38/4665  a.) TTE 08/15/2020: EF 60-65%, mild MR, mod AoV sclerosis, G1DD   Elevated hemoglobin A1c    Fatty liver disease, nonalcoholic    GERD (gastroesophageal reflux disease)    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HCC)    Heart murmur    Hemorrhoid    Hypercholesteremia    Hypertension    Hypothyroidism    IBS (irritable bowel syndrome)    Inflammatory eye disease    Long term current use of systemic steroids    a.) low dose prednisone   Long-term current use of bisphosphonate    a.) ibandronate   Lower extremity weakness    a.) s/p  chemo for Wegener's   Lumbar stenosis    Lung disorder    S/P chemo for Wegener's   Osteopenia    Osteoporosis    Pre-diabetes    RBBB (right bundle branch block)    Seasonal allergies    Wegener's granulomatosis    a.) s/p Tx with cyclophosphamide + MTX; ongoing treatment with chronic corticosteroids    Assessment/Plan: 3 Days Post-Op Procedure(s) (LRB): REVISION FEMORAL STEM RIGHT HIP (Right) Principal Problem:   Status post total hip replacement, right  Estimated body mass index is 31.24 kg/m as calculated from the following:   Height as of this encounter: 5' 6"  (1.676 m).   Weight as of this encounter: 87.8 kg. Advance diet Up  with therapy D/C IV fluids when tolerating po intake.  Vitals stable this morning. Patient loopy this morning after pain medication.  I have D/C the oxycodone and tramadol, will use dilaudid and tylenol for pain control.  Can continue the muscle relaxer as well. Up with therapy today.  Patient has had a BM. If he does well with therapy today with improved pain control, can discharge home this afternoon.  DVT Prophylaxis -  Eliquis, SCDs 50% weightbearing to the right leg.  Raquel Kendrea [place], PA-C [facility] Orthopaedic Surgery 10/28/2021, 8:07 AM

## 2021-10-29 NOTE — Progress Notes (Signed)
Physical Therapy Treatment Patient Details Name: Adrian Zavala MRN: [id number] DOB: 04/11/1950 Today's Date: 10/29/2021   History of Present Illness Adrian Zavala is a 87yoM who comes [facility]/24/21 for elective Rt THA (posterolateral) with Dr. Milagros Evener. Pt sustained a pop in operative thigh and fall, foudn to have perprostheic fracture on POD1, taken back to OR with Dr. Roland Rack 8/2 for THA revision, made PWB thereafter. IZT:IWPY, hypoTSH, HTN, nonalcoholic fatty liver disease, granulomatosis with polyangitis.    PT Comments    Pt scheduled to go home this date, able to do 2 bouts of ~100 ft and negotiate up/down steps with only assist from wife.  Pt continues to show good motivation but still with decreased tolerance, considerable pain and general hesitation with a lot of actvitity.  We extensively went over stair negotiation, HEP, progressive mobility in the home and and PT answered all questions from wife/pt.  Overall pt is making consistent, but slow gains.  Per status HHPT is the most appropriate d/c plan, however due to insurance limitations appears outpt PT will need to start ASAP.   Recommendations for follow up therapy are one component of a multi-disciplinary discharge planning process, led by the attending physician.  Recommendations may be updated based on patient status, additional functional criteria and insurance authorization.  Follow Up Recommendations  Follow physician's recommendations for discharge plan and follow up therapies     Assistance Recommended at Discharge Frequent or constant Supervision/Assistance  Patient can return home with the following A little help with bathing/dressing/bathroom;Assistance with cooking/housework;Assist for transportation;Help with stairs or ramp for entrance;A lot of help with walking and/or transfers   Equipment Recommendations  BSC/3in1;Rolling walker (2 wheels)    Recommendations for Other Services       Precautions / Restrictions  Precautions Precautions: Posterior Hip;Fall Precaution Booklet Issued: Yes (comment) Restrictions RLE Weight Bearing: Partial weight bearing RLE Partial Weight Bearing Percentage or Pounds: 50%     Mobility  Bed Mobility Overal bed mobility: Needs Assistance Bed Mobility: Supine to Sit     Supine to sit: Min guard, HOB elevated Sit to supine: Min assist   General bed mobility comments: In recliner at start and end of session    Transfers Overall transfer level: Needs assistance Equipment used: Rolling walker (2 wheels) Transfers: Sit to/from Stand Sit to Stand: Supervision           General transfer comment: Pt still needs extra time to insure appropriate set up and UE use, but able to rise from multiple surfaces w/o direct assist.    Ambulation/Gait Ambulation/Gait assistance: Min guard Gait Distance (Feet): 100 Feet Assistive device: Rolling walker (2 wheels)         General Gait Details: Pt able to do 2 seperate bouts of ~100 ft - pt becomes fatigued with each effort, O2 stays in the high 90s but HR up to ~130 with DOE and need to sit   Stairs Stairs: Yes Stairs assistance: Min guard Stair Management: No rails, Backwards, With walker Number of Stairs: 4 General stair comments: Wife and pt able to negotiate steps/set up walker/etc w/o direct input from PT.  Pt labored and anxious with the effort but ultimately did complete the task   Wheelchair Mobility    Modified Rankin (Stroke Patients Only)       Balance Overall balance assessment: Needs assistance Sitting-balance support: Bilateral upper extremity supported, Feet supported Sitting balance-Leahy Scale: Fair     Standing balance support: Bilateral upper extremity supported, Reliant  on assistive device for balance Standing balance-Leahy Scale: Fair                              Cognition Arousal/Alertness: Awake/alert Behavior During Therapy: WFL for tasks  assessed/performed Overall Cognitive Status: Within Functional Limits for tasks assessed                                          Exercises Total Joint Exercises Ankle Circles/Pumps: AROM, 10 reps Quad Sets: Strengthening, 10 reps Short Arc Quad: AROM, 10 reps Heel Slides: AROM, 10 reps (with resisted leg ext) Hip ABduction/ADduction: AROM, AAROM, 10 reps    General Comments        Pertinent Vitals/Pain Pain Assessment Pain Assessment: 0-10 Pain Score: 8  Pain Location: R hip and down into the knee    Home Living                          Prior Function            PT Goals (current goals can now be found in the care plan section) Progress towards PT goals: Progressing toward goals    Frequency    BID      PT Plan Discharge plan needs to be updated    Co-evaluation              AM-PAC PT "6 Clicks" Mobility   Outcome Measure  Help needed turning from your back to your side while in a flat bed without using bedrails?: A Little Help needed moving from lying on your back to sitting on the side of a flat bed without using bedrails?: A Little Help needed moving to and from a bed to a chair (including a wheelchair)?: A Little Help needed standing up from a chair using your arms (e.g., wheelchair or bedside chair)?: A Little Help needed to walk in hospital room?: A Lot Help needed climbing 3-5 steps with a railing? : A Lot 6 Click Score: 16    End of Session Equipment Utilized During Treatment: Gait belt Activity Tolerance: Patient limited by pain;Patient limited by fatigue Patient left: in bed;with family/visitor present;with call bell/phone within reach;in chair Nurse Communication: Mobility status PT Visit Diagnosis: Muscle weakness (generalized) (M62.81);Difficulty in walking, not elsewhere classified (R26.2);Pain Pain - Right/Left: Right Pain - part of body: Hip     Time: 0815-0928 PT Time Calculation (min) (ACUTE ONLY):  73 min  Charges:  $Gait Training: 23-37 mins $Therapeutic Exercise: 23-37 mins $Therapeutic Activity: 8-22 mins                     Kreg Shropshire, DPT 10/29/2021, 9:54 AM

## 2021-10-29 NOTE — Plan of Care (Signed)
Patient sleeping between care. Increased bloody drainage to right hip noted. Dressing intact. Ice pack applied. No new complaints overnight. Call bell within reach.   Problem: Education: Goal: Knowledge of General Education information will improve Description: Including pain rating scale, medication(s)/side effects and non-pharmacologic comfort measures Outcome: Progressing   Problem: Health Behavior/Discharge Planning: Goal: Ability to manage health-related needs will improve Outcome: Progressing   Problem: Clinical Measurements: Goal: Ability to maintain clinical measurements within normal limits will improve Outcome: Progressing Goal: Will remain free from infection Outcome: Progressing Goal: Diagnostic test results will improve Outcome: Progressing Goal: Cardiovascular complication will be avoided Outcome: Progressing   Problem: Activity: Goal: Risk for activity intolerance will decrease Outcome: Progressing   Problem: Nutrition: Goal: Adequate nutrition will be maintained Outcome: Progressing   Problem: Elimination: Goal: Will not experience complications related to bowel motility Outcome: Progressing Goal: Will not experience complications related to urinary retention Outcome: Progressing   Problem: Pain Managment: Goal: General experience of comfort will improve Outcome: Progressing   Problem: Safety: Goal: Ability to remain free from injury will improve Outcome: Progressing   Problem: Skin Integrity: Goal: Risk for impaired skin integrity will decrease Outcome: Progressing   Problem: Education: Goal: Knowledge of the prescribed therapeutic regimen will improve Outcome: Progressing Goal: Understanding of discharge needs will improve Outcome: Progressing Goal: Individualized Educational Video(s) Outcome: Progressing   Problem: Activity: Goal: Ability to avoid complications of mobility impairment will improve Outcome: Progressing Goal: Ability to  tolerate increased activity will improve Outcome: Progressing   Problem: Clinical Measurements: Goal: Postoperative complications will be avoided or minimized Outcome: Progressing   Problem: Pain Management: Goal: Pain level will decrease with appropriate interventions Outcome: Progressing   Problem: Skin Integrity: Goal: Will show signs of wound healing Outcome: Progressing

## 2021-10-29 NOTE — Progress Notes (Signed)
Subjective: 4 Days Post-Op Procedure(s) (LRB): REVISION FEMORAL STEM RIGHT HIP (Right) POD3 Primary right total hip replacement. Patient reports pain as moderate.   Patient is well, and has had no acute complaints or problems Plan is to go Home after hospital stay.  Was able to perform stair training better during the second session of therapy yesterday. Negative for chest pain and shortness of breath Fever: no Gastrointestinal:Negative for nausea and vomiting this morning. He has had a BM.  Objective: Vital signs in last 24 hours: Temp:  [98.3 F (36.8 C)-98.6 F (37 C)] 98.3 F (36.8 C) (08/06 0721) Pulse Rate:  [79-91] 91 (08/06 0721) Resp:  [16-18] 16 (08/06 0721) BP: (101-151)/(57-80) 127/61 (08/06 0721) SpO2:  [98 %-100 %] 99 % (08/06 0721)  Intake/Output from previous day:  Intake/Output Summary (Last 24 hours) at 10/29/2021 0753 Last data filed at 10/29/2021 0515 Gross per 24 hour  Intake --  Output 2400 ml  Net -2400 ml    Intake/Output this shift: No intake/output data recorded.  Labs: Recent Labs    10/26/21 0759 10/27/21 0755  HGB 10.7* 10.4*   Recent Labs    10/26/21 0759 10/27/21 0755  WBC 21.8* 18.9*  RBC 3.28* 3.28*  HCT 31.4* 31.0*  PLT 170 179   Recent Labs    10/26/21 0759 10/27/21 0755  NA 142 142  K 3.9 3.9  CL 113* 111  CO2 24 26  BUN 25* 21  CREATININE 1.01 0.93  GLUCOSE 178* 103*  CALCIUM 9.2 9.6   No results for input(s): "LABPT", "INR" in the last 72 hours.   EXAM General - Patient is Alert, Appropriate, and Oriented Extremity - ABD soft Neurovascular intact Intact pulses distally Dorsiflexion/Plantar flexion intact Incision: moderate drainage No cellulitis present Dressing/Incision - Bloody drainage noted to the right hip.  New honeycomb dressing was applied to the right hip. Motor Function - intact, moving foot and toes well on exam.  Bowel sounds this morning, abdomen soft to palpation.  Past Medical History:   Diagnosis Date   Anemia    Aortic atherosclerosis ([place])    Aortic stenosis 06/10/2017   a.) TTE 06/10/2017: EF 60-65%; very mild AS with MPG 5 mmHG; AVA (VTI) = 2.22 cm   Arthritis    Spine, Left Hip, Hands   Ascending aorta dilatation ([place]) 05/07/2016   a.) cCTA 05/07/2016: measured 41 mm; b.) TTE 06/10/2017: measured 39 mm; c.) cCTA 08/04/2020: measured 39 mm; d.) TTE 08/15/2020: measured 38 mm   BPH (benign prostatic hyperplasia)    Coronary artery disease    Crohn's disease ([place])    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[place])    a.) exogenous 2/2 prolonged corticosteroid use   Diastolic dysfunction 66/44/0347   a.) TTE 08/15/2020: EF 60-65%, mild MR, mod AoV sclerosis, G1DD   Elevated hemoglobin A1c    Fatty liver disease, nonalcoholic    GERD (gastroesophageal reflux disease)    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HCC)    Heart murmur    Hemorrhoid    Hypercholesteremia    Hypertension    Hypothyroidism    IBS (irritable bowel syndrome)    Inflammatory eye disease    Long term current use of systemic steroids    a.) low dose prednisone   Long-term current use of bisphosphonate    a.) ibandronate   Lower extremity weakness    a.) s/p chemo for Wegener's   Lumbar stenosis    Lung disorder    S/P chemo for Wegener's   Osteopenia  Osteoporosis    Pre-diabetes    RBBB (right bundle branch block)    Seasonal allergies    Wegener's granulomatosis    a.) s/p Tx with cyclophosphamide + MTX; ongoing treatment with chronic corticosteroids    Assessment/Plan: 4 Days Post-Op Procedure(s) (LRB): REVISION FEMORAL STEM RIGHT HIP (Right) Principal Problem:   Status post total hip replacement, right  Estimated body mass index is 31.24 kg/m as calculated from the following:   Height as of this encounter: 5' 6"  (1.676 m).   Weight as of this encounter: 87.8 kg. Advance diet Up with therapy D/C IV fluids when tolerating po intake.  Vitals stable this morning. He was able to perform stair  training with more confidence yesterday during the second session. He would like to go home, does not wish to go to rehab. Up with therapy today, plan will be for discharge home after morning session of PT.  DVT Prophylaxis -  Eliquis, SCDs 50% weightbearing to the right leg.  Raquel Makhia Vosler, PA-C [facility] Orthopaedic Surgery 10/29/2021, 7:53 AM

## 2021-10-29 NOTE — Progress Notes (Signed)
Blood pressure 135/64, pulse 95, temperature 98.2 F (36.8 C), resp. rate 16, height 5' 6"  (1.676 m), weight 87.8 kg, SpO2 97 %. IV removed site c/d/I discussed pt d/c packet with pt and wife at bedside placed teds and sent home with extra dressings pt verbalized understanding and transported via w/c down to private car.

## 2021-10-30 ENCOUNTER — Telehealth: Payer: Self-pay

## 2021-10-30 NOTE — Telephone Encounter (Signed)
Transition Care Management Unsuccessful Follow-up Telephone Call  Date of discharge and from where:  [facility] 10/29/21  Attempts:  1st Attempt  Reason for unsuccessful TCM follow-up call:  Left voice message

## 2021-11-01 ENCOUNTER — Ambulatory Visit: Payer: Self-pay

## 2021-11-01 NOTE — Telephone Encounter (Signed)
The patients spouse called in stating he had hip surgery and revision but were not given instructions on changing his dressing. She wants to know what to do or if someone can do this for him. Please assist patient further     Chief Complaint: Hip surgery 10/25/21. Asking about dressing change. Sent home "a honeycomb dressing." Current dressing has drainage on it. Instructed to gently remove dressing after washing hands and apply new dressing.No shower or bathing until staples are removed. Symptoms: None Frequency: 10/25/21 Pertinent Negatives: Patient denies  Disposition: [] ED /[] Urgent Care (no appt availability in office) / [] Appointment(In office/virtual)/ []  [facility]/ [] Home Care/ [] Refused Recommended Disposition /[] [facility]/ [x]  Follow-up with PCP Additional Notes: Call back with any further questions or call surgeon.  Answer Assessment - Initial Assessment Questions 1. REASON FOR CALL: "What is your main concern right now?"     Dressing change to hip incision 2. ONSET: "When did the  start?"     Hip surgery 3. SEVERITY: "How bad is the ?"     N/a 4. FEVER: "Do you have a fever?"     No 5. OTHER SYMPTOMS: "Do you have any other new symptoms?"     No 6. TREATMENTS AND RESPONSE: "What have you done so far to try to make this better? What medicines have you used?"     N/a 7. PREGNANCY: "Is there any chance you are pregnant?" "When was your last menstrual period?"     N/a  Protocols used: No Guideline Available-A-[facility]

## 2021-11-06 NOTE — Progress Notes (Signed)
The patient fell in his hospital bathroom while getting up to use the bathroom the morning after surgery.  Therefore, this is to be considered a traumatic injury rather than resulting from a stress or pathologic cause.  Thank you!

## 2021-11-21 NOTE — Progress Notes (Unsigned)
Cardiology Office Note  Date:  11/22/2021   ID:  Adrian Zavala, DOB Jul 02, 1950, MRN [id number]  PCP:  Olin Hauser, DO   Chief Complaint  Patient presents with   12 month follow up     "Doing well." Medications reviewed by the patient verbally.     HPI:  [name] is a  71 year old gentleman with a history of  Wegener's, s/p chemo 1991,   Cushing's, on chronic prednisone,  long history of smoking who stopped in 1998,  hyperlipidemia heart murmur (Previous echocardiogram concerning for bicuspid aortic valve),  coronary artery disease, by CT  CT coronary calcium score 05/07/2016, Score of 497 Mild to moderately dilated ascending aorta 4.1 cm in 04/2016 Vertebral artery stenosis on MRI hyperlipidemia.  Who presents for follow-up of his dilated aorta, coronary calcification  Last seen by myself in clinic August 2022 Seen by one of our providers July 2023 for preop right total hip arthroplasty on 10/24/2021 with Dr. Tawny Hopping day after surgery while in the hospital going to the bathroom Needed surgical repair  Doing PT, still out of work Moving with a walker Pain getting better  Works at [employer], has not worked since May  No regular exercise,  denies shortness of breath or chest pain on exertion Denies any cardiac complications while in the hospital  Previously seen by rheumatology and neurology,  diagnosed with Right trigeminal neuralgia  history of Granulomatous polyangiitis  On prednisone for adrenal insuff, 5 mg daily   on Crestor 40 daily with Zetia Previous myalgias on Lipitor  EKG personally reviewed by myself on todays visit Normal sinus rhythm rate 68 bpm right bundle branch block   CT coronary calcium score 05/07/2016 Score of 497 calcified aortic valve, Dilated ascending aorta 4.1 cm  Echo 07/2020, reviewed  1. Left ventricular ejection fraction, by estimation, is 60 to 65%. The  left ventricle has normal function. The left ventricle  has no regional  wall motion abnormalities. Left ventricular diastolic parameters are  consistent with Grade I diastolic  dysfunction (impaired relaxation).   2. Right ventricular systolic function is normal. The right ventricular  size is normal. Tricuspid regurgitation signal is inadequate for assessing  PA pressure.  Mild mitral valve  regurgitation.   4. The aortic valve is normal in structure. Moderate aortic valve  sclerosis/calcification is present, without any evidence of aortic  stenosis.   5. There is borderline dilatation of the ascending aorta, measuring 38  mm.   Negative trigeminal protocol MRI of the brain, with no structural abnormality to explain patient's symptoms identified. 2. Age-appropriate cerebral atrophy with mild-to-moderate chronic microvascular ischemic disease, grossly similar to previous.  Echo 05/2017 Ascending aorta unchanged,  3.9 cm Discussed with him in detail, no significant change compared to CT scan 2018  ER 11/22/2017 Pain, tremors, lost feeling in scalp and feet Flushing, Tachycardia Previous allergy to bactrim  MRA: 2 mm suspected A-comm aneurysm, Moderate stenosis RIGHT vertebral artery origin.  Stress test 05/14/2016 performed Showing no ischemia normal ejection fraction   Wegener's, long history of chemotherapy, on chronic methotrexate once per week, on chronic prednisone   Reports he's been on cholesterol medication since the 1980s, when it first came out   Prior history and the [employer], Previous issues with gallbladder disease in his 36s   episode of nausea vomiting in November 2016  PMH:   has a past medical history of Anemia, Aortic atherosclerosis ([place]), Aortic stenosis (06/10/2017), Arthritis, Ascending aorta dilatation ([place]) (  05/07/2016), BPH (benign prostatic hyperplasia), Coronary artery disease, Crohn's disease ([place]), 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[place]), Diastolic dysfunction (93/71/6967), Elevated hemoglobin A1c, Fatty liver  disease, nonalcoholic, GERD (gastroesophageal reflux disease), 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[place]), Heart murmur, Hemorrhoid, Hypercholesteremia, Hypertension, Hypothyroidism, IBS (irritable bowel syndrome), Inflammatory eye disease, Long term current use of systemic steroids, Long-term current use of bisphosphonate, Lower extremity weakness, Lumbar stenosis, Lung disorder, Osteopenia, Osteoporosis, Pre-diabetes, RBBB (right bundle branch block), Seasonal allergies, and Wegener's granulomatosis.  PSH:    Past Surgical History:  Procedure Laterality Date   CHOLECYSTECTOMY     has "metal sutures"    COLONOSCOPY N/A 10/25/2014   Procedure: COLONOSCOPY;  Surgeon: Lucilla Lame, MD;  Location: [facility];  Service: Gastroenterology;  Laterality: N/A;   FEMORAL REVISION Right 10/25/2021   Procedure: REVISION FEMORAL STEM RIGHT HIP;  Surgeon: Corky Mull, MD;  Location: [facility];  Service: Orthopedics;  Laterality: Right;   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   Procedure: TOTAL HIP ARTHROPLASTY;  Surgeon: Corky Mull, MD;  Location: [facility];  Service: Orthopedics;  Laterality: Right;   TREATMENT FISTULA ANAL     closure with rectal advancement flap   VASECTOMY      Current Outpatient Medications  Medication Sig Dispense Refill   acetaminophen (TYLENOL) 500 MG tablet Take 2 tablets (1,000 mg total) by mouth every 6 (six) hours. 60 tablet 0   amLODipine (NORVASC) 10 MG tablet Take 1 tablet (10 mg total) by mouth daily. 90 tablet 3   aspirin 81 MG tablet Take 81 mg by mouth daily. AM     B Complex-C-Folic Acid (B-COMPLEX BALANCED PO) Take 1 tablet by mouth daily.     calcium carbonate (OS-CAL) 600 MG TABS tablet Take 600 mg by mouth daily. AM     Cholecalciferol (VITAMIN D3) 25 MCG (1000 UT) CAPS Take 1 capsule by mouth daily.     ezetimibe (ZETIA) 10 MG tablet TAKE 1 TABLET BY MOUTH  DAILY 90 tablet 0   famotidine (PEPCID) 40 MG tablet TAKE 1 TABLET BY MOUTH  DAILY 90 tablet 3    FIBER PO Take 1 capsule by mouth 2 (two) times daily.     fluticasone (FLONASE) 50 MCG/ACT nasal spray Place 2 sprays into both nostrils daily. Use for 4-6 weeks then stop and use seasonally or as needed. 16 g 3   gabapentin (NEURONTIN) 300 MG capsule Take 300 mg by mouth 3 (three) times daily.     HYDROmorphone (DILAUDID) 2 MG tablet Take 0.5-1 tablets (1-2 mg total) by mouth every 6 (six) hours as needed for severe pain (Pain score 7-10). 30 tablet 0   ibandronate (BONIVA) 150 MG tablet Take 150 mg by mouth every 30 (thirty) days.     ibuprofen (ADVIL) 200 MG tablet Take 200 mg by mouth every 6 (six) hours as needed.     ipratropium (ATROVENT) 0.06 % nasal spray Place 2 sprays into both nostrils 4 (four) times daily as needed for rhinitis. 15 mL 2   levothyroxine (SYNTHROID) 25 MCG tablet TAKE 1 TABLET BY MOUTH  DAILY (Patient taking differently: Take by mouth daily before breakfast.) 90 tablet 3   loratadine (CLARITIN) 10 MG tablet Take 10 mg by mouth every morning.     magnesium oxide (MAG-OX) 400 MG tablet Take 400 mg by mouth 2 (two) times daily.     niacin (NIASPAN) 500 MG CR tablet Take 750 mg by mouth at bedtime.     ondansetron (ZOFRAN) 4 MG tablet Take  1 tablet (4 mg total) by mouth every 6 (six) hours as needed for nausea. 30 tablet 0   predniSONE (DELTASONE) 5 MG tablet Take 5-20 mg by mouth daily. Depending on flare up of Wegeners     rosuvastatin (CRESTOR) 40 MG tablet Take 1 tablet (40 mg total) by mouth daily. 90 tablet 2   tiZANidine (ZANAFLEX) 4 MG tablet Take 1 tablet (4 mg total) by mouth every 8 (eight) hours as needed for muscle spasms. 30 tablet 0   traMADol (ULTRAM) 50 MG tablet Take 1 tablet (50 mg total) by mouth every 6 (six) hours as needed. 120 tablet 2   No current facility-administered medications for this visit.     Allergies:   Cephalexin, Other, Penicillin g, Sulfa antibiotics, Sulfamethoxazole-trimethoprim, Codeine, Ampicillin, Penicillins, Quinolones,  Prilosec [omeprazole], and Tetracyclines & related   Social History:  The patient  reports that he quit smoking about 25 years ago. His smoking use included cigarettes. He has a 30.00 pack-year smoking history. He has quit using smokeless tobacco. He reports current alcohol use of about 3.0 standard drinks of alcohol per week. He reports that he does not use drugs.   Family History:   Family history is unknown by patient.    Review of Systems: Review of Systems  Constitutional: Negative.   HENT: Negative.    Respiratory: Negative.    Cardiovascular: Negative.   Gastrointestinal: Negative.   Musculoskeletal:  Positive for joint pain.  Neurological: Negative.   Psychiatric/Behavioral: Negative.    All other systems reviewed and are negative.    PHYSICAL EXAM: VS:  BP 130/62 (BP Location: Left Arm, Patient Position: Sitting, Cuff Size: Normal)   Pulse 68   Ht 5' 6"  (1.676 m)   Wt 192 lb 6 oz (87.3 kg)   SpO2 99%   BMI 31.05 kg/m  , BMI Body mass index is 31.05 kg/m. Constitutional:  oriented to person, place, and time. No distress.  HENT:  Head: Grossly normal Eyes:  no discharge. No scleral icterus.  Neck: No JVD, no carotid bruits  Cardiovascular: Regular rate and rhythm, no murmurs appreciated Pulmonary/Chest: Clear to auscultation bilaterally, no wheezes or rails Abdominal: Soft.  no distension.  no tenderness.  Musculoskeletal: Normal range of motion Neurological:  normal muscle tone. Coordination normal. No atrophy Skin: Skin warm and dry Psychiatric: normal affect, pleasant  Recent Labs: 12/19/2020: TSH 1.220 10/19/2021: ALT 37 10/27/2021: BUN 21; Creatinine, Ser 0.93; Hemoglobin 10.4; Platelets 179; Potassium 3.9; Sodium 142    Lipid Panel Lab Results  Component Value Date   CHOL 142 12/19/2020   HDL 73 12/19/2020   LDLCALC 50 12/19/2020   TRIG 109 12/19/2020      Wt Readings from Last 3 Encounters:  11/22/21 192 lb 6 oz (87.3 kg)  10/25/21 193 lb 9 oz  (87.8 kg)  10/19/21 193 lb 9 oz (87.8 kg)     ASSESSMENT AND PLAN:  Mixed hyperlipidemia Cholesterol is at goal on the current lipid regimen. No changes to the medications were made. Repeat lab work scheduled with primary care  Essential hypertension Blood pressure is well controlled on today's visit. No changes made to the medications.  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 No further workup at this time. Continue current medication regimen.    Total encounter time more than 30 minutes  Greater than 50% was spent in counseling and coordination of care with the patient   No orders of the defined types were placed in this encounter.    Signed, Esmond Plants, M.D., Ph.D. 11/22/2021  [place], [place]

## 2021-11-22 ENCOUNTER — Encounter: Payer: Self-pay | Admitting: Cardiovascular Disease

## 2021-11-22 ENCOUNTER — Ambulatory Visit: Payer: Managed Care, Other (non HMO) | Attending: Cardiovascular Disease | Admitting: Cardiovascular Disease

## 2021-11-22 VITALS — BP 130/62 | HR 68 | Ht 66.0 in | Wt 192.4 lb

## 2021-11-22 DIAGNOSIS — I10 Essential (primary) hypertension: Secondary | ICD-10-CM

## 2021-11-22 DIAGNOSIS — I25118 Atherosclerotic heart disease of native coronary artery with other forms of angina pectoris: Secondary | ICD-10-CM | POA: Diagnosis not present

## 2021-11-22 DIAGNOSIS — Q2381 Bicuspid aortic valve: Secondary | ICD-10-CM

## 2021-11-22 DIAGNOSIS — E785 Hyperlipidemia, unspecified: Secondary | ICD-10-CM

## 2021-11-22 DIAGNOSIS — R931 Abnormal findings on diagnostic imaging of heart and coronary circulation: Secondary | ICD-10-CM

## 2021-11-22 DIAGNOSIS — I77810 Thoracic aortic ectasia: Secondary | ICD-10-CM

## 2021-11-22 DIAGNOSIS — I5032 Chronic diastolic (congestive) heart failure: Secondary | ICD-10-CM | POA: Diagnosis not present

## 2021-11-22 DIAGNOSIS — E781 Pure hyperglyceridemia: Secondary | ICD-10-CM

## 2021-11-22 DIAGNOSIS — I6509 Occlusion and stenosis of unspecified vertebral artery: Secondary | ICD-10-CM

## 2021-11-22 DIAGNOSIS — Q231 Congenital insufficiency of aortic valve: Secondary | ICD-10-CM

## 2021-11-22 DIAGNOSIS — I7121 Aneurysm of the ascending aorta, without rupture: Secondary | ICD-10-CM

## 2021-11-22 MED ORDER — ROSUVASTATIN CALCIUM 40 MG PO TABS: 40.0000 mg | ORAL_TABLET | Freq: Every day | ORAL | 2 refills | Status: DC

## 2021-11-22 MED ORDER — AMLODIPINE BESYLATE 10 MG PO TABS: 10.0000 mg | ORAL_TABLET | Freq: Every day | ORAL | 3 refills | Status: DC

## 2021-11-22 NOTE — Patient Instructions (Signed)
Medication Instructions:  No changes  If you need a refill on your cardiac medications before your next appointment, please call your pharmacy.   Lab work: No new labs needed  Testing/Procedures: No new testing needed  Follow-Up: At [facility], you and your health needs are our priority.  As part of our continuing mission to provide you with exceptional heart care, we have created designated Provider Care Teams.  These Care Teams include your primary Cardiologist (physician) and Advanced Practice Providers (APPs -  Physician Assistants and Nurse Practitioners) who all work together to provide you with the care you need, when you need it.  You will need a follow up appointment in 12 months  Providers on your designated Care Team:   Christopher Berge, NP Ryan Dunn, PA-C Cadence Furth, PA-C  COVID-19 Vaccine Information can be found at: [email or web address] For questions related to vaccine distribution or appointments, please email [email or web address] or call [phone].   

## 2021-12-26 ENCOUNTER — Other Ambulatory Visit: Payer: Self-pay | Admitting: Cardiovascular Disease

## 2021-12-27 NOTE — Telephone Encounter (Signed)
Refills to pharmacy

## 2022-01-05 ENCOUNTER — Other Ambulatory Visit: Payer: Self-pay | Admitting: Family Medicine

## 2022-01-05 DIAGNOSIS — M159 Polyosteoarthritis, unspecified: Secondary | ICD-10-CM

## 2022-01-05 NOTE — Telephone Encounter (Signed)
Requested medication (s) are due for refill today: yes  Requested medication (s) are on the active medication list: yes  Last refill:  08/29/21  Future visit scheduled: no  Notes to clinic:  Unable to refill per protocol, cannot delegate.      Requested Prescriptions  Pending Prescriptions Disp Refills   traMADol (ULTRAM) 50 MG tablet [Pharmacy Med Name: TRAMADOL 50MG TABLETS] 120 tablet     Sig: TAKE 1 TABLET(50 MG) BY MOUTH EVERY 6 HOURS AS NEEDED     Not Delegated - Analgesics:  Opioid Agonists Failed - 01/05/2022  6:39 AM      Failed - This refill cannot be delegated      Failed - Urine Drug Screen completed in last 360 days      Failed - Valid encounter within last 3 months    Recent Outpatient Visits           6 months ago Acquired hypothyroidism   [place], DO   1 year ago Annual physical exam   [place], DO   1 year ago Primary osteoarthritis involving multiple joints   [place], DO   2 years ago Annual physical exam   [place], DO   2 years ago Granulomatosis with polyangiitis without renal involvement [facility])   [place], Devonne Doughty, DO

## 2022-03-09 ENCOUNTER — Ambulatory Visit (INDEPENDENT_AMBULATORY_CARE_PROVIDER_SITE_OTHER): Payer: Managed Care, Other (non HMO) | Admitting: Family Medicine

## 2022-03-09 ENCOUNTER — Encounter: Payer: Self-pay | Admitting: Family Medicine

## 2022-03-09 VITALS — BP 136/76 | HR 88 | Ht 66.0 in | Wt 193.0 lb

## 2022-03-09 DIAGNOSIS — I7121 Aneurysm of the ascending aorta, without rupture: Secondary | ICD-10-CM

## 2022-03-09 DIAGNOSIS — E559 Vitamin D deficiency, unspecified: Secondary | ICD-10-CM

## 2022-03-09 DIAGNOSIS — Z23 Encounter for immunization: Secondary | ICD-10-CM

## 2022-03-09 DIAGNOSIS — R7309 Other abnormal glucose: Secondary | ICD-10-CM

## 2022-03-09 DIAGNOSIS — E039 Hypothyroidism, unspecified: Secondary | ICD-10-CM | POA: Diagnosis not present

## 2022-03-09 DIAGNOSIS — M159 Polyosteoarthritis, unspecified: Secondary | ICD-10-CM

## 2022-03-09 DIAGNOSIS — E782 Mixed hyperlipidemia: Secondary | ICD-10-CM

## 2022-03-09 DIAGNOSIS — I10 Essential (primary) hypertension: Secondary | ICD-10-CM

## 2022-03-09 DIAGNOSIS — R351 Nocturia: Secondary | ICD-10-CM

## 2022-03-09 DIAGNOSIS — Z0000 Encounter for general adult medical examination without abnormal findings: Secondary | ICD-10-CM

## 2022-03-09 DIAGNOSIS — M3130 Wegener's granulomatosis without renal involvement: Secondary | ICD-10-CM

## 2022-03-09 DIAGNOSIS — G509 Disorder of trigeminal nerve, unspecified: Secondary | ICD-10-CM

## 2022-03-09 DIAGNOSIS — N401 Enlarged prostate with lower urinary tract symptoms: Secondary | ICD-10-CM

## 2022-03-09 DIAGNOSIS — E538 Deficiency of other specified B group vitamins: Secondary | ICD-10-CM

## 2022-03-09 DIAGNOSIS — M150 Primary generalized (osteo)arthritis: Secondary | ICD-10-CM

## 2022-03-09 MED ORDER — GABAPENTIN 300 MG PO CAPS: 300.0000 mg | ORAL_CAPSULE | Freq: Three times a day (TID) | ORAL | 3 refills | Status: DC

## 2022-03-09 NOTE — Patient Instructions (Addendum)
Thank you for coming to the office today.  Refill Gabapentin  Keep up with Dr Roland Rack and Physiatry for future injections. May consider heel lifts [facility] orders in  Please schedule a Follow-up Appointment to: Return in about 6 months (around 09/08/2022) for 6 month follow-up Hip/Ortho, HTN, med refills.  If you have any other questions or concerns, please feel free to call the office or send a message through [place]. You may also schedule an earlier appointment if necessary.  Additionally, you may be receiving a survey about your experience at our office within a few days to 1 week by e-mail or mail. We value your feedback.  [name], DO [place]

## 2022-03-09 NOTE — Progress Notes (Signed)
Subjective:    Patient ID: Adrian Zavala, male    DOB: 1950/03/30, 71 y.o.   MRN: [id number]  Adrian Zavala is a 71 y.o. male presenting on 03/09/2022 for No chief complaint on file.   HPI  Here for Annual Physical and Lab fasting orders.  R Hip S/p fracture repair, and periprosthetic fracture Followed by Dr Joice Lofts Complicated course since last visit with R hip fracture > x 2 Repeat Hip surgery 8/1 and 8/2 after fell and rebroke hip  [facility] returned to Dr [name] and has done bilateral injection with significant improvement, awaiting further injections  Heart Murmur Previous history of heart murmur, has followed Cardiology  Bruit R carotid  Dermatitis on R back of leg  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, hands, hips) Dr Landry Mellow [facility]on [facility]ins[facility] / [facility] He had x-rays C/T spine, showed OA/DJD   He continues to take Tramadol up to 3 times daily regularly PRN for pain with good results, needs refill - He has chronic back pain with DJD bone spurs and has curvature of spine, worse with prolonged sitting at work, causing worse back pain, followed by Rheumatology - Taking Tylenol Arthritis 650mg  x 1 per dose in afternoon and evening with some relief - On Tizanidine half of 4mg  TID PRN  - Reports chronic problem with chronic joint pain and stiffness from arthritis. 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 Currently asymptomatic. No known family history of colon CA.   UTD COVID19 vaccine Completed x 4 COVID vaccines. He says he has developed some neuropathy in R arm fingers, and pain in back of neck. Also had Trigeminal neuralgia ongoing after vaccine.     03/09/2022    4:02 PM 06/28/2021    8:32 AM 12/19/2020    8:17 AM  Depression screen PHQ 2/9  Decreased Interest 0 0 0  Down, Depressed, Hopeless 0 0 0  PHQ - 2 Score 0 0 0  Altered sleeping 0 0 1  Tired, decreased energy 1 0 0  Change in appetite 0 0 0  Feeling bad or failure about yourself  0 0 0  Trouble concentrating 1 0 0  Moving slowly or fidgety/restless 0 0 0  Suicidal thoughts 0 0 0  PHQ-9 Score 2 0 1  Difficult doing work/chores Not difficult at all Not difficult at all Not difficult at all      03/09/2022    4:02 PM 06/28/2021    8:33 AM 12/19/2020    8:17 AM  GAD 7 : Generalized Anxiety Score  Nervous, Anxious, on Edge 0 0 0  Control/stop worrying 0 0 0  Worry too much - different things 0 0 0  Trouble relaxing 0 0 0  Restless 0 0 0  Easily annoyed or irritable 0 0 0  Afraid - awful might happen 0 0 0  Total GAD 7 Score 0 0 0  Anxiety Difficulty Not difficult at all Not difficult at all Not difficult at all      Past Medical History:  Diagnosis Date   Anemia    Aortic atherosclerosis (HCC)    Aortic stenosis 06/10/2017   a.) TTE 06/10/2017: EF 60-65%; very mild AS with MPG 5 mmHG; AVA (VTI) = 2.22 cm   Arthritis    Spine, Left Hip, Hands   Ascending aorta dilatation (HCC) 05/07/2016   a.) cCTA 05/07/2016: measured 41 mm; b.) TTE 06/10/2017: measured 39 mm; c.) cCTA 08/04/2020: measured 39 mm; d.) TTE 08/15/2020: measured 38 mm   BPH (benign prostatic hyperplasia)    Coronary artery disease    Crohn's disease (HCC)    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HCC)    a.) exogenous 2/2 prolonged corticosteroid use   Diastolic dysfunction 08/15/2020   a.) TTE 08/15/2020: EF 60-65%, mild MR, mod  AoV sclerosis, G1DD   Elevated hemoglobin A1c    Fatty liver disease, nonalcoholic    GERD (gastroesophageal reflux disease)    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HCC)    Heart murmur    Hemorrhoid    Hypercholesteremia    Hypertension    Hypothyroidism    IBS (irritable bowel syndrome)    Inflammatory eye disease    Long term current use of systemic steroids    a.) low dose prednisone   Long-term current use of bisphosphonate    a.) ibandronate   Lower extremity weakness    a.) s/p chemo for Wegener's   Lumbar stenosis    Lung disorder    S/P chemo for Wegener's   Osteopenia    Osteoporosis    Pre-diabetes    RBBB (right bundle branch block)    Seasonal allergies    Wegener's granulomatosis    a.) s/p Tx with cyclophosphamide + MTX; ongoing treatment with chronic corticosteroids   Past Surgical History:  Procedure Laterality Date   CHOLECYSTECTOMY     has "metal sutures"    COLONOSCOPY N/A 10/25/2014   Procedure: COLONOSCOPY;  Surgeon: Midge Minium, MD;  Location: [facility];  Service: Gastroenterology;  Laterality: N/A;   FEMORAL REVISION Right 10/25/2021   Procedure: REVISION FEMORAL STEM RIGHT HIP;  Surgeon: Christena Flake, MD;  Location: [facility];  Service: Orthopedics;  Laterality: Right;   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   Procedure: TOTAL HIP ARTHROPLASTY;  Surgeon: Christena Flake, MD;  Location: [facility];  Service: Orthopedics;  Laterality: Right;   TREATMENT FISTULA ANAL     closure with rectal advancement flap   VASECTOMY     Social History   Socioeconomic History   Marital status: Married    Spouse name: Kyrie Siwicki   Number of children: Not on file   Years of education: Not on file   Highest education level: Not on file  Occupational History   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[employer], Educational psychologist)  Tobacco Use   Smoking status: Former    Packs/day: 1.00    Years: 30.00    Total pack years: 30.00    Types: Cigarettes    Quit date: 08/24/1996     Years since quitting: 25.5   Smokeless tobacco: Former  Building services engineer Use: Never used  Substance and Sexual Activity   Alcohol use: Yes    Alcohol/week: 3.0 standard drinks of alcohol    Types: 3 Cans of beer per week    Comment: occ   Drug use: No   Sexual activity: Not on file  Other Topics Concern  Not on file  Social History Narrative   Not on file   Social Determinants of Health   Financial Resource Strain: Not on file  Food Insecurity: Not on file  Transportation Needs: Not on file  Physical Activity: Not on file  Stress: Not on file  Social Connections: Not on file  Intimate Partner Violence: Not on file   Family History  Family history unknown: Yes   Current Outpatient Medications on File Prior to Visit  Medication Sig   acetaminophen (TYLENOL) 500 MG tablet Take 2 tablets (1,000 mg total) by mouth every 6 (six) hours.   amLODipine (NORVASC) 10 MG tablet Take 1 tablet (10 mg total) by mouth daily.   aspirin 81 MG tablet Take 81 mg by mouth daily. AM   B Complex-C-Folic Acid (B-COMPLEX BALANCED PO) Take 1 tablet by mouth daily.   calcium carbonate (OS-CAL) 600 MG TABS tablet Take 600 mg by mouth daily. AM   Cholecalciferol (VITAMIN D3) 25 MCG (1000 UT) CAPS Take 1 capsule by mouth daily.   ezetimibe (ZETIA) 10 MG tablet TAKE 1 TABLET BY MOUTH DAILY   famotidine (PEPCID) 40 MG tablet TAKE 1 TABLET BY MOUTH  DAILY   FIBER PO Take 1 capsule by mouth 2 (two) times daily.   fluticasone (FLONASE) 50 MCG/ACT nasal spray Place 2 sprays into both nostrils daily. Use for 4-6 weeks then stop and use seasonally or as needed.   HYDROmorphone (DILAUDID) 2 MG tablet Take 0.5-1 tablets (1-2 mg total) by mouth every 6 (six) hours as needed for severe pain (Pain score 7-10).   ibandronate (BONIVA) 150 MG tablet Take 150 mg by mouth every 30 (thirty) days.   ibuprofen (ADVIL) 200 MG tablet Take 200 mg by mouth every 6 (six) hours as needed.   ipratropium (ATROVENT) 0.06 % nasal  spray Place 2 sprays into both nostrils 4 (four) times daily as needed for rhinitis.   levothyroxine (SYNTHROID) 25 MCG tablet TAKE 1 TABLET BY MOUTH  DAILY (Patient taking differently: Take by mouth daily before breakfast.)   loratadine (CLARITIN) 10 MG tablet Take 10 mg by mouth every morning.   magnesium oxide (MAG-OX) 400 MG tablet Take 400 mg by mouth 2 (two) times daily.   niacin (NIASPAN) 500 MG CR tablet Take 750 mg by mouth at bedtime.   ondansetron (ZOFRAN) 4 MG tablet Take 1 tablet (4 mg total) by mouth every 6 (six) hours as needed for nausea.   predniSONE (DELTASONE) 5 MG tablet Take 5-20 mg by mouth daily. Depending on flare up of Wegeners   rosuvastatin (CRESTOR) 40 MG tablet Take 1 tablet (40 mg total) by mouth daily.   tiZANidine (ZANAFLEX) 4 MG tablet Take 1 tablet (4 mg total) by mouth every 8 (eight) hours as needed for muscle spasms.   traMADol (ULTRAM) 50 MG tablet Take 1 tablet (50 mg total) by mouth every 6 (six) hours as needed.   No current facility-administered medications on file prior to visit.    Review of Systems  Constitutional:  Negative for activity change, appetite change, chills, diaphoresis, fatigue and fever.  HENT:  Negative for congestion and hearing loss.   Eyes:  Negative for visual disturbance.  Respiratory:  Negative for cough, chest tightness, shortness of breath and wheezing.   Cardiovascular:  Negative for chest pain, palpitations and leg swelling.  Gastrointestinal:  Negative for abdominal pain, constipation, diarrhea, nausea and vomiting.  Genitourinary:  Negative for dysuria, frequency and hematuria.  Musculoskeletal:  Positive for  arthralgias. Negative for neck pain.  Skin:  Negative for rash.  Neurological:  Negative for dizziness, weakness, light-headedness, numbness and headaches.  Hematological:  Negative for adenopathy.  Psychiatric/Behavioral:  Negative for behavioral problems, dysphoric mood and sleep disturbance.    Per HPI unless  specifically indicated above      Objective:    BP 136/76   Pulse 88   Ht 5\' 6"  (1.676 m)   Wt 193 lb (87.5 kg)   SpO2 98%   BMI 31.15 kg/m   Wt Readings from Last 3 Encounters:  03/09/22 193 lb (87.5 kg)  11/22/21 192 lb 6 oz (87.3 kg)  10/25/21 193 lb 9 oz (87.8 kg)    Physical Exam Vitals and nursing note reviewed.  Constitutional:      General: He is not in acute distress.    Appearance: He is well-developed. He is not diaphoretic.     Comments: Well-appearing, comfortable, cooperative  HENT:     Head: Normocephalic and atraumatic.  Eyes:     General:        Right eye: No discharge.        Left eye: No discharge.     Conjunctiva/sclera: Conjunctivae normal.     Pupils: Pupils are equal, round, and reactive to light.  Neck:     Thyroid: No thyromegaly.     Vascular: Carotid bruit (R >L stable) present.  Cardiovascular:     Rate and Rhythm: Normal rate and regular rhythm.     Pulses: Normal pulses.     Heart sounds: Murmur (2 out of 6 systolic radiating to R carotid) heard.  Pulmonary:     Effort: Pulmonary effort is normal. No respiratory distress.     Breath sounds: Normal breath sounds. No wheezing or rales.  Abdominal:     General: Bowel sounds are normal. There is no distension.     Palpations: Abdomen is soft. There is no mass.     Tenderness: There is no abdominal tenderness.  Musculoskeletal:        General: No tenderness. Normal range of motion.     Cervical back: Normal range of motion and neck supple.     Comments: Upper / Lower Extremities: - Normal muscle tone, strength bilateral upper extremities 5/5, lower extremities 5/5  Lymphadenopathy:     Cervical: No cervical adenopathy.  Skin:    General: Skin is warm and dry.     Findings: No erythema or rash.  Neurological:     Mental Status: He is alert and oriented to person, place, and time.     Comments: Distal sensation intact to light touch all extremities  Psychiatric:        Mood and  Affect: Mood normal.        Behavior: Behavior normal.        Thought Content: Thought content normal.     Comments: Well groomed, good eye contact, normal speech and thoughts    No results for input(s): "HGBA1C" in the last 8760 hours.     Results for orders placed or performed during the hospital encounter of 10/24/21  Basic metabolic panel  Result Value Ref Range   Sodium 141 135 - 145 mmol/L   Potassium 3.9 3.5 - 5.1 mmol/L   Chloride 108 98 - 111 mmol/L   CO2 24 22 - 32 mmol/L   Glucose, Bld 141 (H) 70 - 99 mg/dL   BUN 20 8 - 23 mg/dL   Creatinine, Ser 1.61 0.61 - 1.24  mg/dL   Calcium 9.8 8.9 - 16.1 mg/dL   GFR, Estimated >09 >60 mL/min   Anion gap 9 5 - 15  CBC  Result Value Ref Range   WBC 12.4 (H) 4.0 - 10.5 K/uL   RBC 3.81 (L) 4.22 - 5.81 MIL/uL   Hemoglobin 12.3 (L) 13.0 - 17.0 g/dL   HCT 45.4 (L) 09.8 - 11.9 %   MCV 94.5 80.0 - 100.0 fL   MCH 32.3 26.0 - 34.0 pg   MCHC 34.2 30.0 - 36.0 g/dL   RDW 14.7 82.9 - 56.2 %   Platelets 174 150 - 400 K/uL   nRBC 0.0 0.0 - 0.2 %  CBC  Result Value Ref Range   WBC 21.8 (H) 4.0 - 10.5 K/uL   RBC 3.28 (L) 4.22 - 5.81 MIL/uL   Hemoglobin 10.7 (L) 13.0 - 17.0 g/dL   HCT 13.0 (L) 86.5 - 78.4 %   MCV 95.7 80.0 - 100.0 fL   MCH 32.6 26.0 - 34.0 pg   MCHC 34.1 30.0 - 36.0 g/dL   RDW 69.6 29.5 - 28.4 %   Platelets 170 150 - 400 K/uL   nRBC 0.0 0.0 - 0.2 %  Basic metabolic panel  Result Value Ref Range   Sodium 142 135 - 145 mmol/L   Potassium 3.9 3.5 - 5.1 mmol/L   Chloride 113 (H) 98 - 111 mmol/L   CO2 24 22 - 32 mmol/L   Glucose, Bld 178 (H) 70 - 99 mg/dL   BUN 25 (H) 8 - 23 mg/dL   Creatinine, Ser 1.32 0.61 - 1.24 mg/dL   Calcium 9.2 8.9 - 44.0 mg/dL   GFR, Estimated >10 >27 mL/min   Anion gap 5 5 - 15  CBC  Result Value Ref Range   WBC 18.9 (H) 4.0 - 10.5 K/uL   RBC 3.28 (L) 4.22 - 5.81 MIL/uL   Hemoglobin 10.4 (L) 13.0 - 17.0 g/dL   HCT 25.3 (L) 66.4 - 40.3 %   MCV 94.5 80.0 - 100.0 fL   MCH 31.7 26.0 -  34.0 pg   MCHC 33.5 30.0 - 36.0 g/dL   RDW 47.4 25.9 - 56.3 %   Platelets 179 150 - 400 K/uL   nRBC 0.0 0.0 - 0.2 %  Basic metabolic panel  Result Value Ref Range   Sodium 142 135 - 145 mmol/L   Potassium 3.9 3.5 - 5.1 mmol/L   Chloride 111 98 - 111 mmol/L   CO2 26 22 - 32 mmol/L   Glucose, Bld 103 (H) 70 - 99 mg/dL   BUN 21 8 - 23 mg/dL   Creatinine, Ser 8.75 0.61 - 1.24 mg/dL   Calcium 9.6 8.9 - 64.3 mg/dL   GFR, Estimated >32 >95 mL/min   Anion gap 5 5 - 15  ABO/Rh  Result Value Ref Range   ABO/RH(D)      O POS Performed at [facility], [address], [place], [place] [address]   Surgical pathology  Result Value Ref Range   SURGICAL PATHOLOGY      SURGICAL PATHOLOGY CASE: [id number] PATIENT: Luis Richer Surgical Pathology Report     Specimen Submitted: A. Loose body, intraarticular B. Femoral head, right  Clinical History: Primary osteoarthritis of right hip    DIAGNOSIS: A. SOFT TISSUE, INTRA-ARTICULAR; EXCISION: - BENIGN SYNOVIAL OSTEOCHONDROMATOSIS, COMPATIBLE WITH LOOSE BODY. - NO EVIDENCE OF MALIGNANCY.  B. FEMORAL HEAD, RIGHT; ARTHROPLASTY: - DEGENERATIVE JOINT DISEASE. - NEGATIVE FOR MALIGNANCY.  GROSS DESCRIPTION:  A. Labeled: Intra-articular loose body Received: Fresh Collection time: 11:22 AM on 10/24/2021 Placed into formalin time: 1:14 PM on 10/24/2021 Tissue fragment(s): 1 Size: 2.4 x 1.6 x 0.7 cm Description: Received is a pale yellow, glistening, rubbery tissue fragment with gritty, friable cut surfaces Representative sections are submitted in 1 cassette.  B. Labeled: Right femoral head Received: Fresh Collection time: 11:24 AM on 10/24/2021 Placed into formalin time: 1:14 PM o n 10/24/2021 Size of specimen:      Head -5.0 x 4.8 x 4.1 cm      Neck -3.9 x 2.9 x 1.4 cm      Additional tissue: None present Articular surface: Tan and glistening with areas of eburnation Cut surface: Tan-yellow and firm Other findings: None  noted  Block summary: 1 -area of eburnation to adjacent cartilage  Tissue decalcification: Cassette 1  CM 10/24/2021  Final Diagnosis performed by Katherine Mantle, MD.   Electronically signed 10/26/2021 9:57:55AM The electronic signature indicates that the named Attending Pathologist has evaluated the specimen Technical component performed at [facility], [address], [place], [place] [address] Lab: [phone] Dir: Jolene Schimke, MD, MMM  Professional component performed at [facility], [facility], [address] [place], [place], [place] [address] Lab: [phone] Dir: Beryle Quant, MD       Assessment & Plan:   Problem List Items Addressed This Visit     Ascending aortic aneurysm [facility])    Followed by Cardiology No new concerns.      BPH (benign prostatic hyperplasia)   Relevant Orders   PSA   Elevated hemoglobin A1c    Due for A1c lab He is doing well, managing diet no new concern      Relevant Orders   Hemoglobin A1c   Essential hypertension    Well-controlled HTN No known complications    Plan:  1. Remain off anti HTN therapy 2. Encourage improved lifestyle - low sodium diet, regular exercise 3. Continue monitor BP outside office, bring readings to next visit, if persistently >140/90 or new symptoms notify office sooner      Relevant Orders   CBC with Differential/Platelet   Comprehensive metabolic panel   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HCC)   HLD (hyperlipidemia)    Order labs Previously controlled  Plan: 1. Continue current meds - Rosuvastatin 20mg  daily, Zetia 10mg  daily 2. Continue ASA 81mg  for primary ASCVD risk reduction 3. Encourage improved lifestyle - low carb/cholesterol, reduce portion size, continue improving regular exercise as tolerated      Relevant Orders   Lipid panel   TSH   Hypothyroidism    Controlled hypothyroidism Continue current Levothyroxine daily      Relevant Orders   TSH   T4, free   Osteoarthritis  of multiple joints    Chronic OA/DJD multiple joints Underlying rheumatologic complication Controlled on Tramadol and muscle relaxant. Refill Tramadol is current review PDMP      Relevant Medications   gabapentin (NEURONTIN) 300 MG capsule   Other Relevant Orders   CBC with Differential/Platelet   Other Visit Diagnoses     Annual physical exam    -  Primary   Relevant Orders   CBC with Differential/Platelet   Lipid panel   Hemoglobin A1c   PSA   TSH   Comprehensive metabolic panel   T4, free   Vitamin B12 nutritional deficiency       Relevant Orders   Vitamin B12   Vitamin D deficiency       Relevant Orders  VITAMIN D 25 Hydroxy (Vit-D Deficiency, Fractures)   Needs flu shot       Relevant Orders   Flu Vaccine QUAD High Dose(Fluad)   Abnormality of trigeminal nerve       Relevant Medications   gabapentin (NEURONTIN) 300 MG capsule       Updated Health Maintenance information [employer] orders given today Encouraged improvement to lifestyle with diet and exercise Goal of weight loss  Flu Shot today  Reviewed complicated R hip course with fracture repair and prosthetic fracture repair With R L longer now Following w/ Ortho  Continues with Physiatry for injections  Discussed heart murmur / carotid bruit is radiation likely. Prior ECHO with aortic sclerosis. Without significant stenosis.   Meds ordered this encounter  Medications   gabapentin (NEURONTIN) 300 MG capsule    Sig: Take 1 capsule (300 mg total) by mouth 3 (three) times daily.    Dispense:  270 capsule    Refill:  3     Follow up plan: Return in about 6 months (around 09/08/2022) for 6 month follow-up Hip/Ortho, HTN, med refills.  Saralyn Pilar, DO [facility] 03/09/2022, 4:10 PM

## 2022-03-10 ENCOUNTER — Encounter: Payer: Self-pay | Admitting: Family Medicine

## 2022-03-10 DIAGNOSIS — Z23 Encounter for immunization: Secondary | ICD-10-CM

## 2022-03-10 NOTE — Assessment & Plan Note (Signed)
Due for A1c lab He is doing well, managing diet no new concern

## 2022-03-10 NOTE — Assessment & Plan Note (Signed)
Followed by Cardiology No new concerns.

## 2022-03-10 NOTE — Assessment & Plan Note (Signed)
Controlled hypothyroidism Continue current Levothyroxine 77mg daily

## 2022-03-10 NOTE — Assessment & Plan Note (Signed)
Order labs Previously controlled  Plan: 1. Continue current meds - Rosuvastatin 40m daily, Zetia 16mdaily 2. Continue ASA 8124mor primary ASCVD risk reduction 3. Encourage improved lifestyle - low carb/cholesterol, reduce portion size, continue improving regular exercise as tolerated

## 2022-03-10 NOTE — Assessment & Plan Note (Signed)
Chronic OA/DJD multiple joints Underlying rheumatologic complication Controlled on Tramadol and muscle relaxant. Refill Tramadol is current review PDMP

## 2022-03-10 NOTE — Assessment & Plan Note (Signed)
Well-controlled HTN No known complications    Plan:  1. Remain off anti HTN therapy 2. Encourage improved lifestyle - low sodium diet, regular exercise 3. Continue monitor BP outside office, bring readings to next visit, if persistently >140/90 or new symptoms notify office sooner

## 2022-03-15 LAB — CBC WITH DIFFERENTIAL/PLATELET
Basophils Absolute: 0.1 10*3/uL (ref 0.0–0.2)
Basos: 1 %
EOS (ABSOLUTE): 0.4 10*3/uL (ref 0.0–0.4)
Eos: 5 %
Hematocrit: 41.1 % (ref 37.5–51.0)
Hemoglobin: 13.8 g/dL (ref 13.0–17.7)
Immature Grans (Abs): 0.1 10*3/uL (ref 0.0–0.1)
Immature Granulocytes: 1 %
Lymphocytes Absolute: 1.7 10*3/uL (ref 0.7–3.1)
Lymphs: 20 %
MCH: 31.2 pg (ref 26.6–33.0)
MCHC: 33.6 g/dL (ref 31.5–35.7)
MCV: 93 fL (ref 79–97)
Monocytes Absolute: 0.9 10*3/uL (ref 0.1–0.9)
Monocytes: 11 %
Neutrophils Absolute: 5.1 10*3/uL (ref 1.4–7.0)
Neutrophils: 62 %
Platelets: 217 10*3/uL (ref 150–450)
RBC: 4.42 x10E6/uL (ref 4.14–5.80)
RDW: 13.5 % (ref 11.6–15.4)
WBC: 8.3 10*3/uL (ref 3.4–10.8)

## 2022-03-15 LAB — COMPREHENSIVE METABOLIC PANEL
ALT: 16 IU/L (ref 0–44)
AST: 17 IU/L (ref 0–40)
Albumin/Globulin Ratio: 2.6 — ABNORMAL HIGH (ref 1.2–2.2)
Albumin: 4.5 g/dL (ref 3.9–4.9)
Alkaline Phosphatase: 84 IU/L (ref 44–121)
BUN/Creatinine Ratio: 18 (ref 10–24)
BUN: 20 mg/dL (ref 8–27)
Bilirubin Total: 0.5 mg/dL (ref 0.0–1.2)
CO2: 24 mmol/L (ref 20–29)
Calcium: 10.3 mg/dL — ABNORMAL HIGH (ref 8.6–10.2)
Chloride: 108 mmol/L — ABNORMAL HIGH (ref 96–106)
Creatinine, Ser: 1.14 mg/dL (ref 0.76–1.27)
Globulin, Total: 1.7 g/dL (ref 1.5–4.5)
Glucose: 88 mg/dL (ref 70–99)
Potassium: 4.5 mmol/L (ref 3.5–5.2)
Sodium: 144 mmol/L (ref 134–144)
Total Protein: 6.2 g/dL (ref 6.0–8.5)
eGFR: 69 mL/min/{1.73_m2} (ref >59)

## 2022-03-15 LAB — LIPID PANEL
Chol/HDL Ratio: 2.2 ratio (ref 0.0–5.0)
Cholesterol, Total: 134 mg/dL (ref 100–199)
HDL: 61 mg/dL (ref >39)
LDL Chol Calc (NIH): 50 mg/dL (ref 0–99)
Triglycerides: 135 mg/dL (ref 0–149)
VLDL Cholesterol Cal: 23 mg/dL (ref 5–40)

## 2022-03-15 LAB — VITAMIN D 25 HYDROXY (VIT D DEFICIENCY, FRACTURES): Vit D, 25-Hydroxy: 39 ng/mL (ref 30.0–100.0)

## 2022-03-15 LAB — VITAMIN B12: Vitamin B-12: 614 pg/mL (ref 232–1245)

## 2022-03-15 LAB — PSA: Prostate Specific Ag, Serum: 0.5 ng/mL (ref 0.0–4.0)

## 2022-03-15 LAB — HEMOGLOBIN A1C
Est. average glucose Bld gHb Est-mCnc: 126 mg/dL
Hgb A1c MFr Bld: 6 % — ABNORMAL HIGH (ref 4.8–5.6)

## 2022-03-15 LAB — T4, FREE: Free T4: 1.09 ng/dL (ref 0.82–1.77)

## 2022-03-15 LAB — TSH: TSH: 1.89 u[IU]/mL (ref 0.450–4.500)

## 2022-03-21 ENCOUNTER — Encounter: Payer: Self-pay | Admitting: Family Medicine

## 2022-03-21 DIAGNOSIS — E039 Hypothyroidism, unspecified: Secondary | ICD-10-CM

## 2022-03-21 MED ORDER — LEVOTHYROXINE SODIUM 25 MCG PO TABS: 25.0000 ug | ORAL_TABLET | Freq: Every day | ORAL | 3 refills | Status: DC

## 2022-04-16 ENCOUNTER — Ambulatory Visit: Payer: Managed Care, Other (non HMO) | Admitting: Family Medicine

## 2022-04-16 ENCOUNTER — Encounter: Payer: Self-pay | Admitting: Family Medicine

## 2022-04-16 VITALS — BP 124/68 | HR 103 | Ht 66.0 in | Wt 196.0 lb

## 2022-04-16 DIAGNOSIS — J0110 Acute frontal sinusitis, unspecified: Secondary | ICD-10-CM

## 2022-04-16 DIAGNOSIS — J069 Acute upper respiratory infection, unspecified: Secondary | ICD-10-CM

## 2022-04-16 DIAGNOSIS — Z20828 Contact with and (suspected) exposure to other viral communicable diseases: Secondary | ICD-10-CM | POA: Diagnosis not present

## 2022-04-16 LAB — POCT INFLUENZA A/B
Influenza A, POC: NEGATIVE
Influenza B, POC: NEGATIVE

## 2022-04-16 MED ORDER — AZITHROMYCIN 250 MG PO TABS: ORAL_TABLET | ORAL | 0 refills | Status: DC

## 2022-04-16 MED ORDER — BENZONATATE 100 MG PO CAPS: 100.0000 mg | ORAL_CAPSULE | Freq: Three times a day (TID) | ORAL | 0 refills | Status: DC | PRN

## 2022-04-16 MED ORDER — IPRATROPIUM BROMIDE 0.06 % NA SOLN: 2.0000 | Freq: Four times a day (QID) | NASAL | 0 refills | Status: DC

## 2022-04-16 NOTE — Progress Notes (Signed)
Subjective:    Patient ID: Adrian Zavala, male    DOB: 02-09-1951, 72 y.o.   MRN: [id number]  Adrian Zavala is a 72 y.o. male presenting on 04/16/2022 for Cough, Sore Throat, and Nasal Congestion  Patient presents for a same day appointment.  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 He has had persistent sinus congestion with post nasal drainage and some productive cough with yellow sputum. - He did a Home COVID test that was negative on 04/15/22 - Here to request Flu Test. - Tried Tylenol, Tramadol, Ibuprofen, Aspirin - Also used some temporary Afrin to open airways Denies fever chills wheezing dyspnea nausea vomiting       03/09/2022    4:02 PM 06/28/2021    8:32 AM 12/19/2020    8:17 AM  Depression screen PHQ 2/9  Decreased Interest 0 0 0  Down, Depressed, Hopeless 0 0 0  PHQ - 2 Score 0 0 0  Altered sleeping 0 0 1  Tired, decreased energy 1 0 0  Change in appetite 0 0 0  Feeling bad or failure about yourself  0 0 0  Trouble concentrating 1 0 0  Moving slowly or fidgety/restless 0 0 0  Suicidal thoughts 0 0 0  PHQ-9 Score 2 0 1  Difficult doing work/chores Not difficult at all Not difficult at all Not difficult at all    Social History   Tobacco Use   Smoking status: Former    Packs/day: 1.00    Years: 30.00    Total pack years: 30.00    Types: Cigarettes    Quit date: 08/24/1996    Years since quitting: 25.6   Smokeless tobacco: Former  Scientific laboratory technician Use: Never used  Substance Use Topics   Alcohol use: Yes    Alcohol/week: 3.0 standard drinks of alcohol    Types: 3 Cans of beer per week    Comment: occ   Drug use: No    Review of Systems Per HPI unless specifically indicated above     Objective:    BP 124/68   Pulse (!) 103   Ht '5\' 6"'$  (1.676 m)   Wt 196 lb (88.9 kg)   SpO2 100%   BMI 31.64 kg/m   Wt Readings from Last 3 Encounters:  04/16/22 196 lb (88.9 kg)  03/09/22  193 lb (87.5 kg)  11/22/21 192 lb 6 oz (87.3 kg)    Physical Exam Vitals and nursing note reviewed.  Constitutional:      General: He is not in acute distress.    Appearance: He is well-developed. He is not diaphoretic.     Comments: Well-appearing, comfortable, cooperative  HENT:     Head: Normocephalic and atraumatic.  Eyes:     General:        Right eye: No discharge.        Left eye: No discharge.     Conjunctiva/sclera: Conjunctivae normal.  Neck:     Thyroid: No thyromegaly.  Cardiovascular:     Rate and Rhythm: Normal rate and regular rhythm.     Pulses: Normal pulses.     Heart sounds: Normal heart sounds. No murmur heard. Pulmonary:     Effort: Pulmonary effort is normal. No respiratory distress.     Breath sounds: Normal breath sounds. No wheezing or rales.  Musculoskeletal:        General: Normal range of motion.  Cervical back: Normal range of motion and neck supple.  Lymphadenopathy:     Cervical: No cervical adenopathy.  Skin:    General: Skin is warm and dry.     Findings: No erythema or rash.  Neurological:     Mental Status: He is alert and oriented to person, place, and time. Mental status is at baseline.  Psychiatric:        Behavior: Behavior normal.     Comments: Well groomed, good eye contact, normal speech and thoughts    Results for orders placed or performed in visit on 04/16/22  POCT Influenza A/B  Result Value Ref Range   Influenza A, POC Negative Negative   Influenza B, POC Negative Negative      Assessment & Plan:   Problem List Items Addressed This Visit   None Visit Diagnoses     Acute non-recurrent frontal sinusitis    -  Primary   Relevant Medications   ipratropium (ATROVENT) 0.06 % nasal spray   benzonatate (TESSALON) 100 MG capsule   azithromycin (ZITHROMAX Z-PAK) 250 MG tablet   Upper respiratory tract infection, unspecified type       Relevant Medications   azithromycin (ZITHROMAX Z-PAK) 250 MG tablet   Other  Relevant Orders   POCT Influenza A/B (Completed)       Likely viral URI vs Sinusitis, he has chronic sinusitis, this may be acute early flare. Home COVID negative 1/21 In office Flu rapid negative 1/22  Stop Afrin / avoid rebound  Start Atrovent nasal spray decongestant 2 sprays in each nostril up to 4 times daily for 7 days  Start Tessalon Perls take 1 capsule up to 3 times a day as needed for cough  OTC Sudafed, supportive care.  If not improving 48 hours Start Azithromycin Z pak (antibiotic) 2 tabs day 1, then 1 tab x 4 days, complete entire course even if improved  Meds ordered this encounter  Medications   ipratropium (ATROVENT) 0.06 % nasal spray    Sig: Place 2 sprays into both nostrils 4 (four) times daily. For up to 5-7 days then stop.    Dispense:  15 mL    Refill:  0   benzonatate (TESSALON) 100 MG capsule    Sig: Take 1 capsule (100 mg total) by mouth 3 (three) times daily as needed for cough.    Dispense:  30 capsule    Refill:  0   azithromycin (ZITHROMAX Z-PAK) 250 MG tablet    Sig: Take 2 tabs ('500mg'$  total) on Day 1. Take 1 tab ('250mg'$ ) daily for next 4 days.    Dispense:  6 tablet    Refill:  0      Follow up plan: Return if symptoms worsen or fail to improve.   [name], [facility] 04/16/2022, 10:55 AM

## 2022-04-16 NOTE — Patient Instructions (Addendum)
Thank you for coming to the office today.  Likely viral URI vs Sinusitis  Stop Afrin  Start Atrovent nasal spray decongestant 2 sprays in each nostril up to 4 times daily for 7 days  Start Tessalon Perls take 1 capsule up to 3 times a day as needed for cough  If not improving 48 hours Start Azithromycin Z pak (antibiotic) 2 tabs day 1, then 1 tab x 4 days, complete entire course even if improved   Please schedule a Follow-up Appointment to: Return if symptoms worsen or fail to improve.  If you have any other questions or concerns, please feel free to call the office or send a message through [place]. You may also schedule an earlier appointment if necessary.  Additionally, you may be receiving a survey about your experience at our office within a few days to 1 week by e-mail or mail. We value your feedback.  [name], DO [place]

## 2022-05-11 ENCOUNTER — Other Ambulatory Visit: Payer: Self-pay | Admitting: Family Medicine

## 2022-05-11 DIAGNOSIS — M159 Polyosteoarthritis, unspecified: Secondary | ICD-10-CM

## 2022-05-11 NOTE — Telephone Encounter (Signed)
Requested medication (s) are due for refill today: Yes  Requested medication (s) are on the active medication list: Yes  Last refill:  01/15/22  Future visit scheduled: Yes  Notes to clinic:  See request.    Requested Prescriptions  Pending Prescriptions Disp Refills   traMADol (ULTRAM) 50 MG tablet [Pharmacy Med Name: TRAMADOL 50MG TABLETS] 120 tablet     Sig: TAKE 1 TABLET(50 MG) BY MOUTH EVERY 6 HOURS AS NEEDED     Not Delegated - Analgesics:  Opioid Agonists Failed - 05/11/2022  7:15 AM      Failed - This refill cannot be delegated      Failed - Urine Drug Screen completed in last 360 days      Passed - Valid encounter within last 3 months    Recent Outpatient Visits           3 weeks ago Acute non-recurrent frontal sinusitis   [place], DO   2 months ago Annual physical exam   [facility] Olin Hauser, DO   10 months ago Acquired hypothyroidism   [place], DO   1 year ago Annual physical exam   [facility] Olin Hauser, DO   1 year ago Primary osteoarthritis involving multiple joints   [place], DO       Future Appointments             In 3 months [place] Ranger, Devonne Doughty, DO [facility], [facility]

## 2022-06-28 ENCOUNTER — Ambulatory Visit
Admission: RE | Admit: 2022-06-28 | Discharge: 2022-06-28 | Disposition: A | Discharge status: Home / Self Care | Payer: Managed Care, Other (non HMO) | Attending: Physician Assistant | Admitting: Physician Assistant

## 2022-06-28 ENCOUNTER — Ambulatory Visit
Admission: RE | Admit: 2022-06-28 | Discharge: 2022-06-28 | Disposition: A | Discharge status: Home / Self Care | Payer: Managed Care, Other (non HMO) | Source: Ambulatory Visit | Attending: Physician Assistant | Admitting: Physician Assistant

## 2022-06-28 ENCOUNTER — Ambulatory Visit: Payer: Self-pay

## 2022-06-28 ENCOUNTER — Ambulatory Visit: Payer: Managed Care, Other (non HMO) | Admitting: Physician Assistant

## 2022-06-28 ENCOUNTER — Encounter: Payer: Self-pay | Admitting: Physician Assistant

## 2022-06-28 VITALS — BP 132/84 | HR 71 | Temp 96.8°F | Wt 199.0 lb

## 2022-06-28 DIAGNOSIS — M25512 Pain in left shoulder: Secondary | ICD-10-CM

## 2022-06-28 DIAGNOSIS — M542 Cervicalgia: Secondary | ICD-10-CM | POA: Diagnosis present

## 2022-06-28 NOTE — Progress Notes (Signed)
Acute Office Visit   Patient: Adrian Zavala   DOB: [birth year]   72 y.o. Male  MRN: [id number] Visit Date: 06/28/2022  Today's healthcare provider: Dani Gobble Tiegan Jambor, PA-C  Introduced myself to the patient as a Journalist, newspaper and provided education on APPs in clinical practice.    Chief Complaint  Patient presents with   Arm Pain    Left arm pain.  Started about a week ago.     Subjective    HPI HPI     Arm Pain    Additional comments: Left arm pain.  Started about a week ago.        Last edited by Kizzie Furnish, CMA on 06/28/2022  2:29 PM.       Left arm pain He is left hand dominant  Onset: sudden  Duration: started about a week ago  Location: along neck and over left shoulder, travels into axilla and then same pain runs from shoulder into fingers  Radiation: hits second, third, fourth and fifth digits, right thumb is excluded  Pain level and character:right now its a 3-4/10 but can get up to 10/10. Sharp in character  Other associated symptoms: denies numbness or tingling sensation. 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 Alba Destine for phys med   Medications: Outpatient Medications Prior to Visit  Medication Sig   acetaminophen (TYLENOL) 500 MG tablet Take 2 tablets (1,000 mg total) by mouth every 6 (six) hours.   amLODipine (NORVASC) 10 MG tablet Take 1 tablet (10 mg total) by mouth daily.   aspirin 81 MG tablet Take 81 mg by mouth daily. AM   B Complex-C-Folic Acid (B-COMPLEX BALANCED PO) Take 1 tablet by mouth daily.   calcium carbonate (OS-CAL) 600 MG TABS tablet Take 600 mg by mouth daily. AM   Cholecalciferol (VITAMIN  D3) 25 MCG (1000 UT) CAPS Take 1 capsule by mouth daily.   ezetimibe (ZETIA) 10 MG tablet TAKE 1 TABLET BY MOUTH DAILY   famotidine (PEPCID) 40 MG tablet TAKE 1 TABLET BY MOUTH  DAILY   FIBER PO Take 1 capsule by mouth 2 (two) times daily.   fluticasone (FLONASE) 50 MCG/ACT nasal spray Place 2 sprays into both nostrils daily. Use for 4-6 weeks then stop and use seasonally or as needed.   gabapentin (NEURONTIN) 300 MG capsule Take 1 capsule (300 mg total) by mouth 3 (three) times daily.   HYDROmorphone (DILAUDID) 2 MG tablet Take 0.5-1 tablets (1-2 mg total) by mouth every 6 (six) hours as needed for severe pain (Pain score 7-10).   ibandronate (BONIVA) 150 MG tablet Take 150 mg by mouth every 30 (thirty) days.   ibuprofen (ADVIL) 200 MG tablet Take 200 mg by mouth every 6 (six) hours as needed.   ipratropium (ATROVENT) 0.06 % nasal spray Place 2 sprays into both nostrils 4 (four) times daily. For up to 5-7 days then stop.   levothyroxine (SYNTHROID) 25 MCG tablet Take 1 tablet (25 mcg total) by mouth daily.   loratadine (CLARITIN) 10 MG tablet Take 10 mg by mouth every morning.  magnesium oxide (MAG-OX) 400 MG tablet Take 400 mg by mouth 2 (two) times daily.   niacin (NIASPAN) 500 MG CR tablet Take 750 mg by mouth at bedtime.   ondansetron (ZOFRAN) 4 MG tablet Take 1 tablet (4 mg total) by mouth every 6 (six) hours as needed for nausea.   predniSONE (DELTASONE) 5 MG tablet Take 5-20 mg by mouth daily. Depending on flare up of [place]   rosuvastatin (CRESTOR) 40 MG tablet Take 1 tablet (40 mg total) by mouth daily.   tiZANidine (ZANAFLEX) 4 MG tablet Take 1 tablet (4 mg total) by mouth every 8 (eight) hours as needed for muscle spasms.   traMADol (ULTRAM) 50 MG tablet Take 1 tablet (50 mg total) by mouth every 6 (six) hours as needed.   azithromycin (ZITHROMAX Z-PAK) 250 MG tablet Take 2 tabs (500mg  total) on Day 1. Take 1 tab (250mg ) daily for next 4 days.   benzonatate (TESSALON) 100 MG  capsule Take 1 capsule (100 mg total) by mouth 3 (three) times daily as needed for cough.   No facility-administered medications prior to visit.    Review of Systems  Musculoskeletal:  Positive for arthralgias and neck pain. Negative for neck stiffness.  Neurological:  Positive for weakness. Negative for tremors and numbness.       Objective    BP 132/84 (BP Location: Left Arm, Patient Position: Sitting, Cuff Size: Large)   Pulse 71   Temp (!) 96.8 F (36 C) (Temporal)   Wt 199 lb (90.3 kg)   SpO2 98%   BMI 32.12 kg/m    Physical Exam Vitals reviewed.  Constitutional:      General: He is awake.     Appearance: Normal appearance. He is well-developed and well-groomed.  HENT:     Head: Normocephalic and atraumatic.  Musculoskeletal:     Right shoulder: No effusion, laceration, tenderness or crepitus. Normal range of motion. Normal strength. Normal pulse.     Left shoulder: No tenderness or crepitus. Normal range of motion.     Right upper arm: Normal.     Left upper arm: Normal.     Right elbow: Normal.     Left elbow: Normal.     Right hand: Normal strength.     Left hand: Normal range of motion. Decreased strength of thumb/finger opposition.     Cervical back: Tenderness present. No spasms or torticollis. Normal range of motion.     Comments: Left shoulder findings: Passive ROM intact and active ROM is mostly intact  Shrugging shoulders was painful Abduction is painful  Mild pain with drop arm testing  Tenderness along left shoulder blade and ac joint  Patient is able to bend elbows to bring hands to face without observable signs of distress and appears to have full use of hands and fingers  Reports neck pain with extension   Neurological:     General: No focal deficit present.     Mental Status: He is alert and oriented to person, place, and time. Mental status is at baseline.     Cranial Nerves: Cranial nerves 2-12 are intact. No dysarthria or facial asymmetry.      Motor: Weakness present. No tremor, atrophy or abnormal muscle tone.  Psychiatric:        Mood and Affect: Mood normal.        Behavior: Behavior normal. Behavior is cooperative.        Thought Content: Thought content normal.  Judgment: Judgment normal.       No results found for any visits on 06/28/22.  Assessment & Plan      No follow-ups on file.      Problem List Items Addressed This Visit   None Visit Diagnoses     Acute pain of left shoulder    -  Primary Acute, new concern Reports pain in left side of neck and left shoulder that radiates into armpit then from left elbow into fingers (thumb is excluded) ROM testing was reassuring- suspect this may be repetitive motion injury from the work he does rather than ligamentous or osseous issue Will get cervical imaging and left shoulder imaging for osseous injury rule out Patient is already taking multiple pain medications so I do not recommend further changes there- continue current regimen After discussion with him, we agreed he should reach out to Phys med provider for further evaluation following xray results as they will likely be able to evaluate and manage more appropriately and get him plugged in with PT quicker  Follow up as needed for persistent or progressing symptoms     Relevant Orders   DG Cervical Spine Complete   DG Shoulder Left   Neck pain on left side       Relevant Orders   DG Cervical Spine Complete        No follow-ups on file.   I, Deolinda Frid E Ulyssa Walthour, PA-C, have reviewed all documentation for this visit. The documentation on 06/28/22 for the exam, diagnosis, procedures, and orders are all accurate and complete.   Talitha Givens, MHS, PA-C [facility]

## 2022-06-28 NOTE — Telephone Encounter (Signed)
  Chief Complaint: arm pain Symptoms: L arm pain, comes and goes, radiates into neck and shoulder, worse when working Frequency: 2-3 days Pertinent Negatives: NA Disposition: [] ED /[] Urgent Care (no appt availability in office) / [x] Appointment(In office/virtual)/ []  [facility]/ [] Home Care/ [] Refused Recommended Disposition /[] [facility]/ []  Follow-up with PCP Additional Notes: pt states he feels like he has nerve pain that radiates from neck into L shoulder down into L forearm. 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 Scheduled OV today at 1420 with Erin, [place].   Reason for Disposition  [1] MODERATE pain (e.g., interferes with normal activities) AND [2] present > 3 days  Answer Assessment - Initial Assessment Questions 1. ONSET: "When did the pain start?"     2-3 days  2. LOCATION: "Where is the pain located?"     L arm  3. PAIN: "How bad is the pain?" (Scale 1-10; or mild, moderate, severe)   - MILD (1-3): Doesn't interfere with normal activities.   - MODERATE (4-7): Interferes with normal activities (e.g., work or school) or awakens from sleep.   - SEVERE (8-10): Excruciating pain, unable to do any normal activities, unable to hold a cup of water.     Comes and goes with manipulation  6. OTHER SYMPTOMS: "Do you have any other symptoms?" (e.g., neck pain, swelling, rash, fever, numbness, weakness)     Neck and shoulder pain on L arm  Protocols used: Arm Pain-A-AH

## 2022-06-28 NOTE — Patient Instructions (Addendum)
I recommend that you reach out to your Phys Med provider for evaluation and please continue your current medications   Depending on what your xrays looks like, we may need to explore further imaging or a referral to PT for management

## 2022-07-02 NOTE — Progress Notes (Signed)
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

## 2022-07-02 NOTE — Progress Notes (Signed)
Your neck xray demonstrated some degenerative changes in the lower vertebra but no fracture, dislocation or severe changes.

## 2022-07-09 ENCOUNTER — Other Ambulatory Visit: Payer: Self-pay | Admitting: Physical Medicine & Rehabilitation

## 2022-07-09 DIAGNOSIS — M5010 Cervical disc disorder with radiculopathy, unspecified cervical region: Secondary | ICD-10-CM

## 2022-07-10 ENCOUNTER — Ambulatory Visit
Admission: RE | Admit: 2022-07-10 | Discharge: 2022-07-10 | Disposition: A | Discharge status: Home / Self Care | Payer: Managed Care, Other (non HMO) | Source: Ambulatory Visit | Attending: Physical Medicine & Rehabilitation | Admitting: Physical Medicine & Rehabilitation

## 2022-07-10 DIAGNOSIS — M5010 Cervical disc disorder with radiculopathy, unspecified cervical region: Secondary | ICD-10-CM | POA: Diagnosis present

## 2022-08-14 ENCOUNTER — Other Ambulatory Visit: Payer: Self-pay | Admitting: Family Medicine

## 2022-08-14 DIAGNOSIS — K219 Gastro-esophageal reflux disease without esophagitis: Secondary | ICD-10-CM

## 2022-08-15 NOTE — Telephone Encounter (Signed)
Requested Prescriptions  Pending Prescriptions Disp Refills   famotidine (PEPCID) 40 MG tablet [Pharmacy Med Name: Famotidine 40 MG Oral Tablet] 90 tablet 1    Sig: TAKE 1 TABLET BY MOUTH DAILY     Gastroenterology:  H2 Antagonists Passed - 08/14/2022  1:46 PM      Passed - Valid encounter within last 12 months    Recent Outpatient Visits           1 month ago Acute pain of left shoulder   [place] [facility] Mecum, [place], [place]   4 months ago Acute non-recurrent frontal sinusitis   [place] [place], Netta Neat, DO   5 months ago Annual physical exam   [facility] [name], DO   1 year ago Acquired hypothyroidism   [place] [facility] [name], DO   1 year ago Annual physical exam   [place] [facility] [name], DO       Future Appointments             In 3 weeks Althea Charon, Netta Neat, DO  [facility], [facility]

## 2022-08-22 NOTE — Progress Notes (Unsigned)
Referring Physician:  Elijah Birk, MD [address] [place],  [place] [address]  Primary Physician:  [name], DO  History of Present Illness: 08/23/2022 Adrian Zavala is here today with a chief complaint of neck and left arm pain.  He has had 1 episode of right arm pain as well.  His pain goes down his arm to his second third and fourth fingers.  He also has discomfort in his armpit at times and in his forearm.  He has had neck pain.  He is left hand dominant, so his left arm pain has been a significant issue for him.  He has pins-and-needles at times.  He has some weakness in his left arm.  Bowel/Bladder Dysfunction: none  Conservative measures:  Physical therapy:  has participated in 4 visits at [facility] 07/25/22-08/08/22 without relief Multimodal medical therapy including regular antiinflammatories:  tramadol, tizanidine, prednisone, gabapentin, tylenol, Injections:  has received epidural steroid injections 07/26/22: Left C6-7 TF ESI (relief for 1 day)  Past Surgery: denies   Adrian Zavala has no symptoms of cervical myelopathy.  The symptoms are causing a significant impact on the patient's life.   I have utilized the care everywhere function in epic to review the outside records available from external health systems.  Review of Systems:  A 10 point review of systems is negative, except for the pertinent positives and negatives detailed in the HPI.  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   a.) cCTA 05/07/2016: measured 41 mm; b.) TTE 06/10/2017: measured 39 mm; c.) cCTA 08/04/2020: measured 39 mm; d.) TTE 08/15/2020: measured 38 mm   BPH (benign prostatic hyperplasia)    Coronary  artery disease    Crohn's disease (HCC)    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HCC)    a.) exogenous 2/2 prolonged corticosteroid use   Diastolic dysfunction 08/15/2020   a.) TTE 08/15/2020: EF 60-65%, mild MR, mod AoV sclerosis, G1DD   Elevated hemoglobin A1c    Fatty liver disease, nonalcoholic    GERD (gastroesophageal reflux disease)    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HCC)    Heart murmur    Hemorrhoid    Hypercholesteremia    Hypertension    Hypothyroidism    IBS (irritable bowel syndrome)    Inflammatory eye disease    Long term current use of systemic steroids    a.) low dose prednisone   Long-term current use of bisphosphonate    a.) ibandronate   Lower extremity weakness    a.) s/p chemo for Wegener's   Lumbar stenosis    Lung disorder    S/P chemo for Wegener's   Osteopenia    Osteoporosis    Pre-diabetes    RBBB (right bundle branch block)    Seasonal allergies    Wegener's granulomatosis    a.) s/p Tx with cyclophosphamide + MTX; ongoing treatment with chronic corticosteroids    Past Surgical History: Past Surgical History:  Procedure Laterality Date   CHOLECYSTECTOMY     has "metal sutures"    COLONOSCOPY N/A 10/25/2014   Procedure: COLONOSCOPY;  Surgeon: Midge Minium, MD;  Location: [facility];  Service: Gastroenterology;  Laterality: N/A;   FEMORAL REVISION  Right 10/25/2021   Procedure: REVISION FEMORAL STEM RIGHT HIP;  Surgeon: Christena Flake, MD;  Location: [facility];  Service: Orthopedics;  Laterality: Right;   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   Procedure: TOTAL HIP ARTHROPLASTY;  Surgeon: Christena Flake, MD;  Location: [facility];  Service: Orthopedics;  Laterality: Right;   TREATMENT FISTULA ANAL     closure with rectal advancement flap   VASECTOMY      Allergies: Allergies as of 08/23/2022 - Review Complete 08/23/2022  Allergen Reaction Noted   Cephalexin Itching 08/02/2020   Penicillin g Other (See Comments) 11/22/2021   Sulfa antibiotics  Anaphylaxis 10/20/2014   Sulfamethoxazole-trimethoprim Anaphylaxis 01/03/2015   Codeine Itching, Other (See Comments), and Rash 10/20/2014   Ampicillin Other (See Comments) 08/02/2020   Penicillins Other (See Comments) 10/20/2014   Quinolones  07/03/2020   Prilosec [omeprazole] Hives and Rash 10/20/2014   Tetracyclines & related Rash 10/20/2014    Medications:  Current Outpatient Medications:    acetaminophen (TYLENOL) 500 MG tablet, Take 2 tablets (1,000 mg total) by mouth every 6 (six) hours., Disp: 60 tablet, Rfl: 0   amLODipine (NORVASC) 10 MG tablet, Take 1 tablet (10 mg total) by mouth daily., Disp: 90 tablet, Rfl: 3   aspirin 81 MG tablet, Take 81 mg by mouth daily. AM, Disp: , Rfl:    azithromycin (ZITHROMAX Z-PAK) 250 MG tablet, Take 2 tabs (500mg  total) on Day 1. Take 1 tab (250mg ) daily for next 4 days., Disp: 6 tablet, Rfl: 0   B Complex-C-Folic Acid (B-COMPLEX BALANCED PO), Take 1 tablet by mouth daily., Disp: , Rfl:    benzonatate (TESSALON) 100 MG capsule, Take 1 capsule (100 mg total) by mouth 3 (three) times daily as needed for cough., Disp: 30 capsule, Rfl: 0   calcium carbonate (OS-CAL) 600 MG TABS tablet, Take 600 mg by mouth daily. AM, Disp: , Rfl:    Cholecalciferol (VITAMIN D3) 25 MCG (1000 UT) CAPS, Take 1 capsule by mouth daily., Disp: , Rfl:    ezetimibe (ZETIA) 10 MG tablet, TAKE 1 TABLET BY MOUTH DAILY, Disp: 90 tablet, Rfl: 3   famotidine (PEPCID) 40 MG tablet, TAKE 1 TABLET BY MOUTH DAILY, Disp: 90 tablet, Rfl: 1   FIBER PO, Take 1 capsule by mouth 2 (two) times daily., Disp: , Rfl:    fluticasone (FLONASE) 50 MCG/ACT nasal spray, Place 2 sprays into both nostrils daily. Use for 4-6 weeks then stop and use seasonally or as needed., Disp: 16 g, Rfl: 3   gabapentin (NEURONTIN) 300 MG capsule, Take 1 capsule (300 mg total) by mouth 3 (three) times daily., Disp: 270 capsule, Rfl: 3   HYDROmorphone (DILAUDID) 2 MG tablet, Take 0.5-1 tablets (1-2 mg total) by mouth  every 6 (six) hours as needed for severe pain (Pain score 7-10)., Disp: 30 tablet, Rfl: 0   ibandronate (BONIVA) 150 MG tablet, Take 150 mg by mouth every 30 (thirty) days., Disp: , Rfl:    ibuprofen (ADVIL) 200 MG tablet, Take 200 mg by mouth every 6 (six) hours as needed., Disp: , Rfl:    ipratropium (ATROVENT) 0.06 % nasal spray, Place 2 sprays into both nostrils 4 (four) times daily. For up to 5-7 days then stop., Disp: 15 mL, Rfl: 0   levothyroxine (SYNTHROID) 25 MCG tablet, Take 1 tablet (25 mcg total) by mouth daily., Disp: 90 tablet, Rfl: 3   loratadine (CLARITIN) 10 MG tablet, Take 10 mg by mouth every morning., Disp: , Rfl:  magnesium oxide (MAG-OX) 400 MG tablet, Take 400 mg by mouth 2 (two) times daily., Disp: , Rfl:    niacin (NIASPAN) 500 MG CR tablet, Take 750 mg by mouth at bedtime., Disp: , Rfl:    ondansetron (ZOFRAN) 4 MG tablet, Take 1 tablet (4 mg total) by mouth every 6 (six) hours as needed for nausea., Disp: 30 tablet, Rfl: 0   predniSONE (DELTASONE) 5 MG tablet, Take 5-20 mg by mouth daily. Depending on flare up of Wegeners, Disp: , Rfl:    rosuvastatin (CRESTOR) 40 MG tablet, Take 1 tablet (40 mg total) by mouth daily., Disp: 90 tablet, Rfl: 2   tiZANidine (ZANAFLEX) 4 MG tablet, Take 1 tablet (4 mg total) by mouth every 8 (eight) hours as needed for muscle spasms., Disp: 30 tablet, Rfl: 0   traMADol (ULTRAM) 50 MG tablet, Take 1 tablet (50 mg total) by mouth every 6 (six) hours as needed., Disp: 120 tablet, Rfl: 2  Social History: Social History   Tobacco Use   Smoking status: Former    Packs/day: 1.00    Years: 30.00    Additional pack years: 0.00    Total pack years: 30.00    Types: Cigarettes    Quit date: 08/24/1996    Years since quitting: 26.0   Smokeless tobacco: Former  Building services engineer Use: Never used  Substance Use Topics   Alcohol use: Yes    Alcohol/week: 3.0 standard drinks of alcohol    Types: 3 Cans of beer per week    Comment: occ   Drug  use: No    Family Medical History: Family History  Family history unknown: Yes    Physical Examination: Vitals:   08/23/22 1506  BP: (!) 140/82    General: Patient is in no apparent distress. Attention to examination is appropriate.  Neck:   Supple.  Full range of motion with pain.  Respiratory: Patient is breathing without any difficulty.   NEUROLOGICAL:     Awake, alert, oriented to person, place, and time.  Speech is clear and fluent.   Cranial Nerves: Pupils equal round and reactive to light.  Facial tone is symmetric.  Facial sensation is symmetric. Shoulder shrug is symmetric. Tongue protrusion is midline.  There is no pronator drift.  Strength: Side Biceps Triceps Deltoid Interossei Grip Wrist Ext. Wrist Flex.  R 5 5 5 5 5 5 5   L 4 4 5 5 5 5 5    Side Iliopsoas Quads Hamstring PF DF EHL  R 5 5 5 5 5 5   L 5 5 5 5 5 5    Reflexes are 1+ and symmetric at the biceps, triceps, brachioradialis, patella and achilles.   Hoffman's is absent.   Bilateral upper and lower extremity sensation is intact to light touch.    No evidence of dysmetria noted.  Gait is normal.     Medical Decision Making  Imaging: MRI C spine 07/10/2022 Disc levels:   C2-C3: Mild anterolisthesis with mild ligament flavum and up to moderate right side facet hypertrophy. Lobulated and rightward disc bulging with mild endplate spurring. Borderline to mild spinal stenosis (series 8, image 6). No cord mass effect. Mild to moderate left and severe right C3 foraminal stenosis.   C3-C4: Mild disc bulging and small central disc protrusion (series 9, image 11). Mild to moderate facet hypertrophy greater on the right. No significant spinal stenosis. Moderate left and moderate to severe right C4 foraminal stenosis.   C4-C5: Mild anterolisthesis. Mild  disc bulging and endplate spurring. Small midline annular fissure and/or disc protrusion (series 9, image 16). Mild facet hypertrophy. No spinal  stenosis. Mild left and mild-to-moderate right C5 foraminal stenosis.   C5-C6: Disc space loss and mild retrolisthesis. Circumferential disc bulge and endplate spurring eccentric to the right. Mild facet hypertrophy. No significant spinal stenosis. Moderate left and moderate to severe right C6 foraminal stenosis.   C6-C7: Disc space loss. Circumferential disc bulging but superimposed moderate sized (roughly 8 mm) leftward disc extrusion mostly affecting the neural foramen on that side. See series 8, image 25 and series 5, image 11. Mild superimposed facet and ligament flavum hypertrophy. Mild spinal stenosis. Severe left C7 neural foraminal stenosis. Moderate to severe contralateral right C7 foraminal stenosis.   C7-T1: Mild mostly midline disc bulging. Mild facet and ligament flavum hypertrophy. No spinal or left foraminal stenosis. Mild to moderate right C8 foraminal stenosis.   IMPRESSION: 1. Symptomatic level with regard to left side symptoms appears to be C6-C7, where a moderate-sized leftward disc extrusion severely affects the left neural foramen. Query Left C7 radiculitis. Up to mild associated spinal stenosis.   2. Otherwise multilevel cervical spine degeneration elsewhere, with mild spondylolisthesis, predominantly right side neural foraminal stenosis (frequently moderate and severe), and up to mild additional spinal stenosis. No spinal cord signal abnormality.     Electronically Signed   By: Odessa Fleming M.D.   On: 07/10/2022 08:56  I have personally reviewed the images and agree with the above interpretation.  Assessment and Plan: [name] is a pleasant 72 y.o. 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  He has concordant clinical findings that suggest that C6 and C7 are both impacted.  I recommended surgical intervention.  He has tried physical therapy and has been discharged has not an  appropriate candidate.  He had an injection that gave him 1 day of relief.  I do not think that further conservative management is indicated.  I have suggested and recommended C5-7 anterior cervical discectomy and fusion.  I discussed the planned procedure at length with the patient, including the risks, benefits, alternatives, and indications. The risks discussed include but are not limited to bleeding, infection, need for reoperation, spinal fluid leak, stroke, vision loss, anesthetic complication, coma, paralysis, and even death. We also discussed the possibility of post-operative dysphagia, vocal cord paralysis, and the risk of adjacent segment disease in the future. I also described in detail that improvement was not guaranteed.  The patient expressed understanding of these risks, and asked that we proceed with surgery. I described the surgery in layman's terms, and gave ample opportunity for questions, which were answered to the best of my ability.     Thank you for involving me in the care of this patient.      Ranveer Wahlstrom K. Myer Haff MD, [facility]

## 2022-08-22 NOTE — H&P (View-Only) (Signed)
  Referring Physician:  Morales, Jennifer I, MD [address] [place],  [place] [address]  Primary Physician:  Karamalegos, Alexander J, DO  History of Present Illness: 08/23/2022 [name] is here today with a chief complaint of neck and left arm pain.  He has had 1 episode of right arm pain as well.  His pain goes down his arm to his second third and fourth fingers.  He also has discomfort in his armpit at times and in his forearm.  He has had neck pain.  He is left hand dominant, so his left arm pain has been a significant issue for him.  He has pins-and-needles at times.  He has some weakness in his left arm.  Bowel/Bladder Dysfunction: none  Conservative measures:  Physical therapy:  has participated in 4 visits at [facility] 07/25/22-08/08/22 without relief Multimodal medical therapy including regular antiinflammatories:  tramadol, tizanidine, prednisone, gabapentin, tylenol, Injections:  has received epidural steroid injections 07/26/22: Left C6-7 TF ESI (relief for 1 day)  Past Surgery: denies   Adrian Zavala has no symptoms of cervical myelopathy.  The symptoms are causing a significant impact on the patient's life.   I have utilized the care everywhere function in epic to review the outside records available from external health systems.  Review of Systems:  A 10 point review of systems is negative, except for the pertinent positives and negatives detailed in the HPI.  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   a.) cCTA 05/07/2016: measured 41 mm; b.) TTE 06/10/2017: measured 39 mm; c.) cCTA 08/04/2020: measured 39 mm; d.) TTE 08/15/2020: measured 38 mm   BPH (benign prostatic hyperplasia)    Coronary  artery disease    Crohn's disease (HCC)    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HCC)    a.) exogenous 2/2 prolonged corticosteroid use   Diastolic dysfunction 08/15/2020   a.) TTE 08/15/2020: EF 60-65%, mild MR, mod AoV sclerosis, G1DD   Elevated hemoglobin A1c    Fatty liver disease, nonalcoholic    GERD (gastroesophageal reflux disease)    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HCC)    Heart murmur    Hemorrhoid    Hypercholesteremia    Hypertension    Hypothyroidism    IBS (irritable bowel syndrome)    Inflammatory eye disease    Long term current use of systemic steroids    a.) low dose prednisone   Long-term current use of bisphosphonate    a.) ibandronate   Lower extremity weakness    a.) s/p chemo for Wegener's   Lumbar stenosis    Lung disorder    S/P chemo for Wegener's   Osteopenia    Osteoporosis    Pre-diabetes    RBBB (right bundle branch block)    Seasonal allergies    Wegener's granulomatosis    a.) s/p Tx with cyclophosphamide + MTX; ongoing treatment with chronic corticosteroids    Past Surgical History: Past Surgical History:  Procedure Laterality Date   CHOLECYSTECTOMY     has "metal sutures"    COLONOSCOPY N/A 10/25/2014   Procedure: COLONOSCOPY;  Surgeon: Darren Wohl, MD;  Location: [facility];  Service: Gastroenterology;  Laterality: N/A;   FEMORAL REVISION   Right 10/25/2021   Procedure: REVISION FEMORAL STEM RIGHT HIP;  Surgeon: Poggi, John J, MD;  Location: [facility];  Service: Orthopedics;  Laterality: Right;   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   Procedure: TOTAL HIP ARTHROPLASTY;  Surgeon: Poggi, John J, MD;  Location: [facility];  Service: Orthopedics;  Laterality: Right;   TREATMENT FISTULA ANAL     closure with rectal advancement flap   VASECTOMY      Allergies: Allergies as of 08/23/2022 - Review Complete 08/23/2022  Allergen Reaction Noted   Cephalexin Itching 08/02/2020   Penicillin g Other (See Comments) 11/22/2021   Sulfa antibiotics  Anaphylaxis 10/20/2014   Sulfamethoxazole-trimethoprim Anaphylaxis 01/03/2015   Codeine Itching, Other (See Comments), and Rash 10/20/2014   Ampicillin Other (See Comments) 08/02/2020   Penicillins Other (See Comments) 10/20/2014   Quinolones  07/03/2020   Prilosec [omeprazole] Hives and Rash 10/20/2014   Tetracyclines & related Rash 10/20/2014    Medications:  Current Outpatient Medications:    acetaminophen (TYLENOL) 500 MG tablet, Take 2 tablets (1,000 mg total) by mouth every 6 (six) hours., Disp: 60 tablet, Rfl: 0   amLODipine (NORVASC) 10 MG tablet, Take 1 tablet (10 mg total) by mouth daily., Disp: 90 tablet, Rfl: 3   aspirin 81 MG tablet, Take 81 mg by mouth daily. AM, Disp: , Rfl:    azithromycin (ZITHROMAX Z-PAK) 250 MG tablet, Take 2 tabs (500mg total) on Day 1. Take 1 tab (250mg) daily for next 4 days., Disp: 6 tablet, Rfl: 0   B Complex-C-Folic Acid (B-COMPLEX BALANCED PO), Take 1 tablet by mouth daily., Disp: , Rfl:    benzonatate (TESSALON) 100 MG capsule, Take 1 capsule (100 mg total) by mouth 3 (three) times daily as needed for cough., Disp: 30 capsule, Rfl: 0   calcium carbonate (OS-CAL) 600 MG TABS tablet, Take 600 mg by mouth daily. AM, Disp: , Rfl:    Cholecalciferol (VITAMIN D3) 25 MCG (1000 UT) CAPS, Take 1 capsule by mouth daily., Disp: , Rfl:    ezetimibe (ZETIA) 10 MG tablet, TAKE 1 TABLET BY MOUTH DAILY, Disp: 90 tablet, Rfl: 3   famotidine (PEPCID) 40 MG tablet, TAKE 1 TABLET BY MOUTH DAILY, Disp: 90 tablet, Rfl: 1   FIBER PO, Take 1 capsule by mouth 2 (two) times daily., Disp: , Rfl:    fluticasone (FLONASE) 50 MCG/ACT nasal spray, Place 2 sprays into both nostrils daily. Use for 4-6 weeks then stop and use seasonally or as needed., Disp: 16 g, Rfl: 3   gabapentin (NEURONTIN) 300 MG capsule, Take 1 capsule (300 mg total) by mouth 3 (three) times daily., Disp: 270 capsule, Rfl: 3   HYDROmorphone (DILAUDID) 2 MG tablet, Take 0.5-1 tablets (1-2 mg total) by mouth  every 6 (six) hours as needed for severe pain (Pain score 7-10)., Disp: 30 tablet, Rfl: 0   ibandronate (BONIVA) 150 MG tablet, Take 150 mg by mouth every 30 (thirty) days., Disp: , Rfl:    ibuprofen (ADVIL) 200 MG tablet, Take 200 mg by mouth every 6 (six) hours as needed., Disp: , Rfl:    ipratropium (ATROVENT) 0.06 % nasal spray, Place 2 sprays into both nostrils 4 (four) times daily. For up to 5-7 days then stop., Disp: 15 mL, Rfl: 0   levothyroxine (SYNTHROID) 25 MCG tablet, Take 1 tablet (25 mcg total) by mouth daily., Disp: 90 tablet, Rfl: 3   loratadine (CLARITIN) 10 MG tablet, Take 10 mg by mouth every morning., Disp: , Rfl:      magnesium oxide (MAG-OX) 400 MG tablet, Take 400 mg by mouth 2 (two) times daily., Disp: , Rfl:    niacin (NIASPAN) 500 MG CR tablet, Take 750 mg by mouth at bedtime., Disp: , Rfl:    ondansetron (ZOFRAN) 4 MG tablet, Take 1 tablet (4 mg total) by mouth every 6 (six) hours as needed for nausea., Disp: 30 tablet, Rfl: 0   predniSONE (DELTASONE) 5 MG tablet, Take 5-20 mg by mouth daily. Depending on flare up of Wegeners, Disp: , Rfl:    rosuvastatin (CRESTOR) 40 MG tablet, Take 1 tablet (40 mg total) by mouth daily., Disp: 90 tablet, Rfl: 2   tiZANidine (ZANAFLEX) 4 MG tablet, Take 1 tablet (4 mg total) by mouth every 8 (eight) hours as needed for muscle spasms., Disp: 30 tablet, Rfl: 0   traMADol (ULTRAM) 50 MG tablet, Take 1 tablet (50 mg total) by mouth every 6 (six) hours as needed., Disp: 120 tablet, Rfl: 2  Social History: Social History   Tobacco Use   Smoking status: Former    Packs/day: 1.00    Years: 30.00    Additional pack years: 0.00    Total pack years: 30.00    Types: Cigarettes    Quit date: 08/24/1996    Years since quitting: 26.0   Smokeless tobacco: Former  Vaping Use   Vaping Use: Never used  Substance Use Topics   Alcohol use: Yes    Alcohol/week: 3.0 standard drinks of alcohol    Types: 3 Cans of beer per week    Comment: occ   Drug  use: No    Family Medical History: Family History  Family history unknown: Yes    Physical Examination: Vitals:   08/23/22 1506  BP: (!) 140/82    General: Patient is in no apparent distress. Attention to examination is appropriate.  Neck:   Supple.  Full range of motion with pain.  Respiratory: Patient is breathing without any difficulty.   NEUROLOGICAL:     Awake, alert, oriented to person, place, and time.  Speech is clear and fluent.   Cranial Nerves: Pupils equal round and reactive to light.  Facial tone is symmetric.  Facial sensation is symmetric. Shoulder shrug is symmetric. Tongue protrusion is midline.  There is no pronator drift.  Strength: Side Biceps Triceps Deltoid Interossei Grip Wrist Ext. Wrist Flex.  R 5 5 5 5 5 5 5  L 4 4 5 5 5 5 5   Side Iliopsoas Quads Hamstring PF DF EHL  R 5 5 5 5 5 5  L 5 5 5 5 5 5   Reflexes are 1+ and symmetric at the biceps, triceps, brachioradialis, patella and achilles.   Hoffman's is absent.   Bilateral upper and lower extremity sensation is intact to light touch.    No evidence of dysmetria noted.  Gait is normal.     Medical Decision Making  Imaging: MRI C spine 07/10/2022 Disc levels:   C2-C3: Mild anterolisthesis with mild ligament flavum and up to moderate right side facet hypertrophy. Lobulated and rightward disc bulging with mild endplate spurring. Borderline to mild spinal stenosis (series 8, image 6). No cord mass effect. Mild to moderate left and severe right C3 foraminal stenosis.   C3-C4: Mild disc bulging and small central disc protrusion (series 9, image 11). Mild to moderate facet hypertrophy greater on the right. No significant spinal stenosis. Moderate left and moderate to severe right C4 foraminal stenosis.   C4-C5: Mild anterolisthesis. Mild   disc bulging and endplate spurring. Small midline annular fissure and/or disc protrusion (series 9, image 16). Mild facet hypertrophy. No spinal  stenosis. Mild left and mild-to-moderate right C5 foraminal stenosis.   C5-C6: Disc space loss and mild retrolisthesis. Circumferential disc bulge and endplate spurring eccentric to the right. Mild facet hypertrophy. No significant spinal stenosis. Moderate left and moderate to severe right C6 foraminal stenosis.   C6-C7: Disc space loss. Circumferential disc bulging but superimposed moderate sized (roughly 8 mm) leftward disc extrusion mostly affecting the neural foramen on that side. See series 8, image 25 and series 5, image 11. Mild superimposed facet and ligament flavum hypertrophy. Mild spinal stenosis. Severe left C7 neural foraminal stenosis. Moderate to severe contralateral right C7 foraminal stenosis.   C7-T1: Mild mostly midline disc bulging. Mild facet and ligament flavum hypertrophy. No spinal or left foraminal stenosis. Mild to moderate right C8 foraminal stenosis.   IMPRESSION: 1. Symptomatic level with regard to left side symptoms appears to be C6-C7, where a moderate-sized leftward disc extrusion severely affects the left neural foramen. Query Left C7 radiculitis. Up to mild associated spinal stenosis.   2. Otherwise multilevel cervical spine degeneration elsewhere, with mild spondylolisthesis, predominantly right side neural foraminal stenosis (frequently moderate and severe), and up to mild additional spinal stenosis. No spinal cord signal abnormality.     Electronically Signed   By: H  Hall M.D.   On: 07/10/2022 08:56  I have personally reviewed the images and agree with the above interpretation.  Assessment and Plan: Mr. Sedore is a pleasant 71 y.o. 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  He has concordant clinical findings that suggest that C6 and C7 are both impacted.  I recommended surgical intervention.  He has tried physical therapy and has been discharged has not an  appropriate candidate.  He had an injection that gave him 1 day of relief.  I do not think that further conservative management is indicated.  I have suggested and recommended C5-7 anterior cervical discectomy and fusion.  I discussed the planned procedure at length with the patient, including the risks, benefits, alternatives, and indications. The risks discussed include but are not limited to bleeding, infection, need for reoperation, spinal fluid leak, stroke, vision loss, anesthetic complication, coma, paralysis, and even death. We also discussed the possibility of post-operative dysphagia, vocal cord paralysis, and the risk of adjacent segment disease in the future. I also described in detail that improvement was not guaranteed.  The patient expressed understanding of these risks, and asked that we proceed with surgery. I described the surgery in layman's terms, and gave ample opportunity for questions, which were answered to the best of my ability.     Thank you for involving me in the care of this patient.      Malak Duchesneau K. Tobey Schmelzle MD, MPHS Neurosurgery  

## 2022-08-23 ENCOUNTER — Ambulatory Visit: Payer: Managed Care, Other (non HMO) | Admitting: Neurosurgery

## 2022-08-23 ENCOUNTER — Other Ambulatory Visit: Payer: Self-pay

## 2022-08-23 ENCOUNTER — Encounter: Payer: Self-pay | Admitting: Neurosurgery

## 2022-08-23 VITALS — BP 140/82 | Ht 66.0 in | Wt 196.4 lb

## 2022-08-23 DIAGNOSIS — M5412 Radiculopathy, cervical region: Secondary | ICD-10-CM

## 2022-08-23 DIAGNOSIS — M4312 Spondylolisthesis, cervical region: Secondary | ICD-10-CM

## 2022-08-23 DIAGNOSIS — M4802 Spinal stenosis, cervical region: Secondary | ICD-10-CM

## 2022-08-23 DIAGNOSIS — Z01818 Encounter for other preprocedural examination: Secondary | ICD-10-CM

## 2022-08-23 NOTE — Patient Instructions (Signed)
Please see below for information in regards to your upcoming surgery:   Planned surgery: C5-7 anterior cervical discectomy and fusion   Surgery date: 09/05/22 - you will find out your arrival time the business day before your surgery.   Pre-op appointment at [facility] Pre-admit Testing: we will call you with a date/time for this. Pre-admit testing is located on the first floor of the [facility], [address] [facility] [address], [address]. Please bring all prescriptions in the original prescription bottles to your appointment, even if you have reviewed medications by phone with a pharmacy representative. Pre-op labs may be done at your pre-op appointment. You are not required to fast for these labs. Should you need to change your pre-op appointment, please call Pre-admit testing at [phone].    Surgical clearance: we will send a clearance request to Dr Althea Charon    Blood thinners: ok to stay on aspirin 81mg      NSAIDS (Non-steroidal anti-inflammatory drugs): because you are having a fusion, no NSAIDS (such as ibuprofen, aleve, naproxen, meloxicam, diclofenac) for 3 months after surgery. Celebrex is an exception. Tylenol is ok because it is not an NSAID.     Because you are having a fusion: for appointments after your 2 week follow-up: please arrive at the [facility] outpatient imaging center ([address], [address], [place]) or [facility] one hour prior to your appointment for x-rays. This applies to every appointment after your 2 week follow-up. Failure to do so may result in your appointment being rescheduled.   If you have FMLA/disability paperwork, please drop it off or fax it to [phone], attention Patty.   How to contact us:  If you have any questions/concerns before or after surgery, you can reach [place] at [phone], or you can send a mychart message. We can be reached by phone or mychart 8am-4pm, Monday-Friday.  *Please note:  Calls after 4pm are forwarded to a third party answering service. Mychart messages are not routinely monitored during evenings, weekends, and holidays. Please call our office to contact the answering service for urgent concerns during non-business hours.     Appointments/FMLA & disability paperwork: Patty & Cristin  Nurse: Royston Cowper  Medical assistants: Laurann Montana Physician Assistant's: Manning Charity & Drake Leach Surgeon: Venetia Night, MD

## 2022-08-24 ENCOUNTER — Telehealth: Payer: Self-pay | Admitting: Cardiovascular Disease

## 2022-08-24 ENCOUNTER — Telehealth: Payer: Self-pay | Admitting: *Deleted

## 2022-08-24 ENCOUNTER — Telehealth: Payer: Self-pay

## 2022-08-24 NOTE — Telephone Encounter (Signed)
   Pre-operative Risk Assessment    Patient Name: Adrian Zavala  DOB: [birth year] MRN: [id number]      Request for Surgical Clearance    Procedure:   C5-7 anterior cervical disectomy and fusion   Date of Surgery:  Clearance 09/05/22                                 Surgeon:  Dr Cheron Schaumann Surgeon's Group or Practice Name:  [facility] Phone number:  [phone] Fax number:  [phone]   Type of Clearance Requested:   - Pharmacy:  Hold N/A  ok to stay on aspirin 81mg    Type of Anesthesia:  General    Additional requests/questions:    Courtney Heys   08/24/2022, 12:43 PM

## 2022-08-24 NOTE — Telephone Encounter (Signed)
Per discussion with Dr Myer Haff, he agrees that it is appropriate for him to be out of work since 08/02/22 and remain out of work until at least 6 weeks after surgery based on his evaluation/exam during his appointment yesterday. I already gave him a work note to be out of work until 10/22/22 during his appointment yesterday. I have written a separate note regarding him being out of work since 08/02/22.

## 2022-08-24 NOTE — Telephone Encounter (Signed)
CORRECTION ON CLEARANCE FORM; SURGEON IS DR. Venetia Night; NOT Dr Cheron Schaumann how is pt's PCP.    Tele pre op appt 08/31/22 add on due to procedure date Med rec and consent are done.

## 2022-08-24 NOTE — Telephone Encounter (Signed)
-----   Message from Rockey Situ sent at 08/24/2022 10:35 AM EDT ----- Regarding: updated work note Contact: [phone] Patient has been out of work since 08/02/2022 due to his symptoms. Would you be willing to write a letter that the patient has been out of work since 08/02/2022. His STD reached out to Dr.Morales and she marked on the form that he was able to work, while he really was not. Now STD wants him pay back the money they paid out to him.

## 2022-08-24 NOTE — Telephone Encounter (Signed)
   Name: Adrian Zavala  DOB: [birth year]  MRN: [id number]  Primary Cardiologist: [name], MD   Preoperative team, please contact this patient and set up a phone call appointment for further preoperative risk assessment. Please obtain consent and complete medication review. Thank you for your help.  I confirm that guidance regarding antiplatelet and oral anticoagulation therapy has been completed and, if necessary, noted below.  None requested.   Ronney Asters, NP 08/24/2022, 12:59 PM [facility]

## 2022-08-24 NOTE — Telephone Encounter (Signed)
CORRECTION ON CLEARANCE FORM; SURGEON IS DR. Venetia Night; NOT Dr Cheron Schaumann how is pt's PCP.    Tele pre op appt 08/31/22 add on due to procedure date Med rec and consent are done.      Patient Consent for Virtual Visit        Adrian Zavala has provided verbal consent on 08/24/2022 for a virtual visit (video or telephone).   CONSENT FOR VIRTUAL VISIT FOR:  Adrian Zavala  By participating in this virtual visit I agree to the following:  I hereby voluntarily request, consent and authorize [facility] and its employed or contracted physicians, physician assistants, nurse practitioners or other licensed health care professionals (the Practitioner), to provide me with telemedicine health care services (the "Services") as deemed necessary by the treating Practitioner. I acknowledge and consent to receive the Services by the Practitioner via telemedicine. I understand that the telemedicine visit will involve communicating with the Practitioner through live audiovisual communication technology and the disclosure of certain medical information by electronic transmission. I acknowledge that I have been given the opportunity to request an in-person assessment or other available alternative prior to the telemedicine visit and am voluntarily participating in the telemedicine visit.  I understand that I have the right to withhold or withdraw my consent to the use of telemedicine in the course of my care at any time, without affecting my right to future care or treatment, and that the Practitioner or I may terminate the telemedicine visit at any time. I understand that I have the right to inspect all information obtained and/or recorded in the course of the telemedicine visit and may receive copies of available information for a reasonable fee.  I understand that some of the potential risks of receiving the Services via telemedicine include:  Delay or interruption in medical evaluation due to  technological equipment failure or disruption; Information transmitted may not be sufficient (e.g. poor resolution of images) to allow for appropriate medical decision making by the Practitioner; and/or  In rare instances, security protocols could fail, causing a breach of personal health information.  Furthermore, I acknowledge that it is my responsibility to provide information about my medical history, conditions and care that is complete and accurate to the best of my ability. I acknowledge that Practitioner's advice, recommendations, and/or decision may be based on factors not within their control, such as incomplete or inaccurate data provided by me or distortions of diagnostic images or specimens that may result from electronic transmissions. I understand that the practice of medicine is not an exact science and that Practitioner makes no warranties or guarantees regarding treatment outcomes. I acknowledge that a copy of this consent can be made available to me via my patient portal [facility] MyChart), or I can request a printed copy by calling the office of [facility].    I understand that my insurance will be billed for this visit.   I have read or had this consent read to me. I understand the contents of this consent, which adequately explains the benefits and risks of the Services being provided via telemedicine.  I have been provided ample opportunity to ask questions regarding this consent and the Services and have had my questions answered to my satisfaction. I give my informed consent for the services to be provided through the use of telemedicine in my medical care

## 2022-08-27 NOTE — Telephone Encounter (Signed)
Letter has been faxed and a message was left for the patient that it has been done.

## 2022-08-29 ENCOUNTER — Encounter
Admission: RE | Admit: 2022-08-29 | Discharge: 2022-08-29 | Disposition: A | Discharge status: Home / Self Care | Payer: Managed Care, Other (non HMO) | Source: Ambulatory Visit | Attending: Neurosurgery | Admitting: Neurosurgery

## 2022-08-29 ENCOUNTER — Encounter: Payer: Self-pay | Admitting: Neurosurgery

## 2022-08-29 DIAGNOSIS — Q231 Congenital insufficiency of aortic valve: Secondary | ICD-10-CM | POA: Diagnosis not present

## 2022-08-29 DIAGNOSIS — I10 Essential (primary) hypertension: Secondary | ICD-10-CM

## 2022-08-29 DIAGNOSIS — I4510 Unspecified right bundle-branch block: Secondary | ICD-10-CM | POA: Diagnosis not present

## 2022-08-29 DIAGNOSIS — Z01812 Encounter for preprocedural laboratory examination: Secondary | ICD-10-CM

## 2022-08-29 DIAGNOSIS — Z01818 Encounter for other preprocedural examination: Secondary | ICD-10-CM | POA: Diagnosis present

## 2022-08-29 HISTORY — DX: Acute cholecystitis: K81.0

## 2022-08-29 LAB — CBC
HCT: 41.5 % (ref 39.0–52.0)
Hemoglobin: 13.9 g/dL (ref 13.0–17.0)
MCH: 32 pg (ref 26.0–34.0)
MCHC: 33.5 g/dL (ref 30.0–36.0)
MCV: 95.6 fL (ref 80.0–100.0)
Platelets: 197 10*3/uL (ref 150–400)
RBC: 4.34 MIL/uL (ref 4.22–5.81)
RDW: 13.4 % (ref 11.5–15.5)
WBC: 12.3 10*3/uL — ABNORMAL HIGH (ref 4.0–10.5)
nRBC: 0 % (ref 0.0–0.2)

## 2022-08-29 LAB — TYPE AND SCREEN
ABO/RH(D): O POS
Antibody Screen: NEGATIVE

## 2022-08-29 LAB — BASIC METABOLIC PANEL
Anion gap: 8 (ref 5–15)
BUN: 28 mg/dL — ABNORMAL HIGH (ref 8–23)
CO2: 23 mmol/L (ref 22–32)
Calcium: 9.8 mg/dL (ref 8.9–10.3)
Chloride: 108 mmol/L (ref 98–111)
Creatinine, Ser: 1.08 mg/dL (ref 0.61–1.24)
GFR, Estimated: >60 mL/min (ref >60)
Glucose, Bld: 135 mg/dL — ABNORMAL HIGH (ref 70–99)
Potassium: 3.3 mmol/L — ABNORMAL LOW (ref 3.5–5.1)
Sodium: 139 mmol/L (ref 135–145)

## 2022-08-29 LAB — SURGICAL PCR SCREEN
MRSA, PCR: NEGATIVE
Staphylococcus aureus: NEGATIVE

## 2022-08-29 NOTE — Progress Notes (Signed)
Perioperative / Anesthesia Services  Pre-Admission Testing Clinical Review / Preoperative Anesthesia Consult  Date: 08/31/22  Patient Demographics:  Name: Adrian Zavala DOB:   June 26, 1950 MRN:   [id number]  Planned Surgical Procedure(s):    Case: [id number] Date/Time: 09/05/22 1135   Procedure: C5-7 ANTERIOR CERVICAL DISCECTOMY AND FUSION (HEDRON)   Anesthesia type: General   Pre-op diagnosis:      Cervical radiculopathy M54.12      Cervical stenosis of spinal canal M48.02      Spondylolisthesis of cervical region M43.12   Location: [facility] OR ROOM 03 / [facility]   Surgeons: Venetia Night, MD     NOTE: Available PAT nursing documentation and vital signs have been reviewed. Clinical nursing staff has updated patient's PMH/PSHx, current medication list, and drug allergies/intolerances to ensure comprehensive history available to assist in medical decision making as it pertains to the aforementioned surgical procedure and anticipated anesthetic course. Extensive review of available clinical information personally performed. [place] [facility] and PSHx updated with any diagnoses/procedures that  may have been inadvertently omitted during his intake with the pre-admission testing department's nursing staff.  Clinical Discussion:  Adrian Zavala is a 72 y.o. male who is submitted for pre-surgical anesthesia review and clearance prior to him undergoing the above procedure.  Patient is a Former Smoker (30 pack years; quit 08/1996). Pertinent PMH includes: CAD, diastolic dysfunction, aortic stenosis, ascending aorta dilatation, aortic atherosclerosis, cardiac murmur, RBBB, HTN, HLD, prediabetes, hypothyroidism, exogenous Cushing syndrome, GERD (on daily H2 blocker), Wegener's granulomatosis with associated polyangiitis, anemia, lower extremity weakness, osteoporosis, OA, lumbar stenosis, cervical stenosis/spondylolisthesis/radiculopathy, BPH, Crohn's disease/IBS.   Patient is  followed by cardiology [name], MD). He was last seen in the cardiology clinic on 11/22/2021; notes reviewed. At the time of his clinic visit, patient doing well overall from a cardiovascular perspective. Patient denied any chest pain, shortness of breath, PND, orthopnea, palpitations, significant peripheral edema, weakness, fatigue, vertiginous symptoms, or presyncope/syncope. Patient with a past medical history significant for cardiovascular diagnoses. Documented physical exam was grossly benign, providing no evidence of acute exacerbation and/or decompensation of the patient's known cardiovascular conditions.  Myocardial perfusion imaging study performed on 05/14/2016 revealed a normal left ventricular systolic function with an EF of 55-65%.  There was 1 mm Adrian depression noted during stress in leads V3-V6.  Changes suggestive of ischemia, however there was noted motion artifact.  There was no evidence of a stress-induced myocardial ischemia or arrhythmia. Blood pressure demonstrated normal response to exercise.  Study determined to be normal and low risk.   Coronary CTA performed on 08/04/2020 revealed an elevated coronary calcium score of 975, which was 87th percentile for age, sex, and race matched control.  There was normal coronary origin with RIGHT dominance.  Calcified plaque noted in the RCA and proximal LAD causing a approximately 50% stenosis.  Mild ascending aortic dilatation noted with a maximum diameter of 39 mm.  Aortic valve trileaflet/tricuspid.  FFR analysis revealed no hemodynamically significant stenosis (ranges: >0.80 normal, 0.76-0.80 borderline, <0.75 high likelihood of hemodynamically significant stenosis):   LM = no significant stenosis LAD = 0.8 LCx = 0.9 RCA = not performed due to significant motion artifact   Most recent TTE was performed on 08/15/2020 revealing a normal left ventricular systolic function with an EF of 60 to 65%.  There were no regional wall motion  abnormalities. Diastolic Doppler parameters consistent with abnormal relaxation (G1DD).  Mild mitral valve regurgitation noted.  Moderate aortic valve sclerosis/calcification present with  no evidence of aortic valve stenosis.  Borderline dilatation of the ascending aorta noted with a maximum diameter of 38 mm. Of note, previous echocardiogram from 06/10/2017 revealed very mild aortic valve stenosis with a mean pressure gradient of 5 mmHg; AVA (VTI) = 2.22 cm.    Blood pressure reasonably controlled at 130/62 mmHg on currently prescribed CCB (amlodipine) monotherapy.  Patient is on rosuvastatin + ezetimibe for his HLD diagnosis and ASCVD prevention. Patient does have a prediabetes diagnosis. His last glycosylated hemoglobin level was stable at 6.0% when checked on 03/14/2022. He does not have an OSAH diagnosis. Patient was making efforts to maintain an active lifestyle. Functional capacity, as defined by DASI, is documented as being >/= 4 METS. No changes were made to his medication regimen during his visit with cardiology.  Patient scheduled to follow-up with outpatient cardiology in 12 months or sooner if needed.  Adrian Zavala is scheduled for an elective C5-7 ANTERIOR CERVICAL DISCECTOMY AND FUSION (HEDRON) on 09/05/2022 with Dr. Venetia Night, MD.  Given patient's past medical history significant for cardiovascular diagnoses, presurgical cardiac clearance was sought by the PAT team. Per cardiology, "[name]'s perioperative risk of a major cardiac event is 6.6% according to the Revised Cardiac Risk Index (RCRI).  Therefore, he is at low risk for perioperative complications.   His functional capacity is good at 4.73 METs according to the [facility] Activity Status Index (DASI). According to ACC/AHA guidelines, no further cardiovascular testing needed.  The patient may proceed to surgery at ACCEPTABLE risk".   In review of his medication reconciliation, it is noted that patient is currently on prescribed  daily antithrombotic therapy.  Given patient's history of cardiovascular diagnoses, cardiology recommending that patient remain on his daily low-dose ASA throughout his perioperative course.  This has been discussed with patient's primary attending surgeon who is amenable to this patient remaining on ASA as recommended.  Patient has been updated on recommendations from his specialty providers.  Patient denies previous perioperative complications with anesthesia in the past. In review of the available records, it is noted that patient underwent a general anesthetic course here at [facility] (ASA III) in 10/2021 without documented complications.      08/29/2022   10:10 AM 08/23/2022    3:06 PM 06/28/2022    2:28 PM  Vitals with BMI  Height 5\' 6"  5\' 6"    Weight 194 lbs 196 lbs 6 oz 199 lbs  BMI 31.33 31.71   Systolic 127 140 161  Diastolic 69 82 84  Pulse 84  71    Providers/Specialists:   NOTE: Primary physician provider listed below. Patient may have been seen by APP or partner within same practice.   PROVIDER ROLE / SPECIALTY LAST Adrian Furlong, MD Neurosurgery (Surgeon) 08/23/2022  Adrian Cords, DO Primary Care Provider 06/28/2022  Adrian Nordmann, MD Cardiology 11/22/2021; update preop APP call on 08/31/2022  Adrian Nordmann, MD Rheumatology 06/21/2022  Adrian Jungling, MD Physiatry 08/07/2022   Allergies:  Cephalexin, Penicillin g, Sulfa antibiotics, Sulfamethoxazole-trimethoprim, Codeine, Ampicillin, Penicillins, Quinolones, Prilosec [omeprazole], and Tetracyclines & related  Current Home Medications:   No current facility-administered medications for this encounter.    acetaminophen (TYLENOL) 650 MG CR tablet   amLODipine (NORVASC) 10 MG tablet   aspirin 81 MG tablet   calcium carbonate (OS-CAL) 600 MG TABS tablet   Cholecalciferol (VITAMIN D3) 25 MCG (1000 UT) CAPS   ezetimibe (ZETIA) 10 MG tablet   famotidine (PEPCID)  40 MG  tablet   FIBER PO   fluticasone (FLONASE) 50 MCG/ACT nasal spray   gabapentin (NEURONTIN) 300 MG capsule   ibandronate (BONIVA) 150 MG tablet   ibuprofen (ADVIL) 200 MG tablet   levothyroxine (SYNTHROID) 25 MCG tablet   loratadine (CLARITIN) 10 MG tablet   Magnesium 200 MG TABS   niacin (NIASPAN) 500 MG CR tablet   predniSONE (DELTASONE) 5 MG tablet   rosuvastatin (CRESTOR) 40 MG tablet   traMADol (ULTRAM) 50 MG tablet   History:   Past Medical History:  Diagnosis Date   Anemia    Aortic atherosclerosis (HCC)    Aortic stenosis 06/10/2017   a.) TTE 06/10/2017: EF 60-65%; very mild AS with MPG 5 mmHG; AVA (VTI) = 2.22 cm   Arthritis    Spine, Left Hip, Hands   Ascending aorta dilatation (HCC) 05/07/2016   a.) cCTA 05/07/2016: measured 41 mm; b.) TTE 06/10/2017: measured 39 mm; c.) cCTA 08/04/2020: measured 39 mm; d.) TTE 08/15/2020: measured 38 mm   BPH (benign prostatic hyperplasia)    Cervical radiculopathy    Coronary artery disease    Crohn's disease (HCC)    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HCC)    a.) exogenous 2/2 prolonged corticosteroid use   Diastolic dysfunction 08/15/2020   a.) TTE 08/15/2020: EF 60-65%, mild MR, mod AoV sclerosis, G1DD   Fatty liver disease, nonalcoholic    Gangrene of gallbladder    GERD (gastroesophageal reflux disease)    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HCC)    Heart murmur    Hemorrhoid    Hypercholesteremia    Hypertension    Hypothyroidism    IBS (irritable bowel syndrome)    Inflammatory eye disease    Long term current use of systemic steroids    a.) low dose prednisone   Long-term current use of bisphosphonate    a.) ibandronate   Lower extremity weakness    a.) s/p chemo for Wegener's   Lumbar stenosis    Lung disorder    S/P chemo for Wegener's   Osteopenia    Osteoporosis    Pre-diabetes    RBBB (right bundle branch block)    Seasonal allergies    Spinal stenosis in cervical region    Spondylolisthesis of cervical region     Wegener's granulomatosis    a.) s/p Tx with cyclophosphamide + MTX; ongoing treatment with chronic corticosteroids   Past Surgical History:  Procedure Laterality Date   CATARACT EXTRACTION W/ INTRAOCULAR LENS  IMPLANT, BILATERAL Bilateral    CHOLECYSTECTOMY     with exploration; has "metal sutures"   COLONOSCOPY N/A 10/25/2014   Procedure: COLONOSCOPY;  Surgeon: Midge Minium, MD;  Location[facility];  Service: Gastroenterology;  Laterality: N/A;   FEMORAL REVISION Right 10/25/2021   Procedure: REVISION FEMORAL STEM RIGHT HIP;  Surgeon: Christena Flake, MD;  Location: [facility] ORS;  Service: Orthopedics;  Laterality: Right;   LASER RESURFACING Bilateral    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   Procedure: TOTAL HIP ARTHROPLASTY;  Surgeon: Christena Flake, MD;  Location: [facility] ORS;  Service: Orthopedics;  Laterality: Right;   TREATMENT FISTULA ANAL     closure with rectal advancement flap   VASECTOMY     Family History  Family history unknown: Yes   Social History   Tobacco Use   Smoking status: Former    Packs/day: 1.00    Years: 30.00    Additional pack years: 0.00    Total pack years: 30.00    Types: Cigarettes  Quit date: 08/24/1996    Years since quitting: 26.0   Smokeless tobacco: Never  Vaping Use   Vaping Use: Never used  Substance Use Topics   Alcohol use: Yes    Alcohol/week: 3.0 standard drinks of alcohol    Types: 3 Cans of beer per week    Comment: occassional   Drug use: No    Pertinent Clinical Results:  LABS:   Hospital Outpatient Visit on 08/29/2022  Component Date Value Ref Range Status   Sodium 08/29/2022 139  135 - 145 mmol/L Final   Potassium 08/29/2022 3.3 (L)  3.5 - 5.1 mmol/L Final   Chloride 08/29/2022 108  98 - 111 mmol/L Final   CO2 08/29/2022 23  22 - 32 mmol/L Final   Glucose, Bld 08/29/2022 135 (H)  70 - 99 mg/dL Final   Glucose reference range applies only to samples taken after fasting for at least 8 hours.   BUN 08/29/2022  28 (H)  8 - 23 mg/dL Final   Creatinine, Ser 08/29/2022 1.08  0.61 - 1.24 mg/dL Final   Calcium 16/12/9602 9.8  8.9 - 10.3 mg/dL Final   GFR, Estimated 08/29/2022 >60  >60 mL/min Final   Comment: (NOTE) Calculated using the CKD-EPI Creatinine Equation (2021)    Anion gap 08/29/2022 8  5 - 15 Final   Performed at [facility], [address] [place], [place] [address]   WBC 08/29/2022 12.3 (H)  4.0 - 10.5 K/uL Final   RBC 08/29/2022 4.34  4.22 - 5.81 MIL/uL Final   Hemoglobin 08/29/2022 13.9  13.0 - 17.0 g/dL Final   HCT 11/91/4782 41.5  39.0 - 52.0 % Final   MCV 08/29/2022 95.6  80.0 - 100.0 fL Final   MCH 08/29/2022 32.0  26.0 - 34.0 pg Final   MCHC 08/29/2022 33.5  30.0 - 36.0 g/dL Final   RDW 95/62/1308 13.4  11.5 - 15.5 % Final   Platelets 08/29/2022 197  150 - 400 K/uL Final   nRBC 08/29/2022 0.0  0.0 - 0.2 % Final   Performed at [facility], [address] [place], [place] [address]   ABO/RH(D) 08/29/2022 O POS   Final   Antibody Screen 08/29/2022 NEG   Final   Sample Expiration 08/29/2022 09/12/2022,2359   Final   Extend sample reason 08/29/2022    Final                   Value:NO TRANSFUSIONS OR PREGNANCY IN THE PAST 3 MONTHS Performed at [facility], [address] [place], [place] [address]    MRSA, PCR 08/29/2022 NEGATIVE  NEGATIVE Final   Staphylococcus aureus 08/29/2022 NEGATIVE  NEGATIVE Final   Comment: (NOTE) The Xpert SA Assay (FDA approved for NASAL specimens in patients 3 years of age and older), is one component of a comprehensive surveillance program. It is not intended to diagnose infection nor to guide or monitor treatment. Performed at [facility], [address] [place], [place] [address]     ECG: Date: 08/31/2022 Time ECG obtained: 1100 AM Rate: 72 bpm Rhythm:  Normal sinus rhythm; RBBB Axis (leads I and aVF): Normal Intervals: PR 146 ms. QRS 140 ms. QTc 444 ms. Adrian segment and T wave changes: No  evidence of acute Adrian segment elevation or depression Comparison: Similar to previous tracing obtained on 11/22/2021   IMAGING / PROCEDURES: MR CERVICAL SPINE WO CONTRAST performed on 07/10/2022 Symptomatic level with regard to left side symptoms appears to  be C6-C7, where a moderate-sized leftward disc extrusion severely affects the left neural foramen. Query Left C7 radiculitis. Up to mild associated spinal stenosis. Otherwise multilevel cervical spine degeneration elsewhere, with mild spondylolisthesis, predominantly right side neural foraminal stenosis (frequently moderate and severe), and up to mild additional spinal stenosis. No spinal cord signal abnormality.   TRANSTHORACIC ECHOCARDIOGRAM performed on 08/15/2020 Left ventricular ejection fraction, by estimation, is 60 to 65%. The left ventricle has normal function. The left ventricle has no regional wall motion abnormalities. Left ventricular diastolic parameters are consistent with Grade I diastolic dysfunction (impaired relaxation).  Right ventricular systolic function is normal. The right ventricular size is normal. Tricuspid regurgitation signal is inadequate for assessing PA pressure.  The mitral valve is normal in structure. Mild mitral valve regurgitation.  The aortic valve is normal in structure. Moderate aortic valve sclerosis/calcification is present, without any evidence of aortic stenosis.  There is borderline dilatation of the ascending aorta, measuring 38 mm.    CT CORONARY MORPH W/CTA COR W/SCORE W/CA W/CM &/OR WO/CM; FRACTIONAL FLOW RESERVE performed on 08/04/2020 No acute intrathoracic abnormality. Similar appearing evidence of prior granulomatous disease. Coronary calcium score of 975. This was 87th percentile for age and sex matched control. Normal coronary origin with right dominance. Calcified plaque causing moderate stenosis in the RCA and proximal LAD. CAD-RADS 3. Moderate stenosis. Consider symptom-guided  anti-ischemic pharmacotherapy as well as risk factor modification per guideline directed care.   Mild ascending aorta dilation, maximum diameter 39mm. Aortic valve is trileaflet/tricuspid  FFR analysis demonstrated no significant stenosis. Left Main:  No significant stenosis. LAD: No significant stenosis.  FFRct 0.8 LCX: No significant stenosis.  FFRct 0.9 RCA: FFRct analysis not performed due to significant motion artifact.   MYOCARDIAL PERFUSION IMAGING STUDY (LEXISCAN) performed on 05/14/2016 Normal left ventricular systolic function with an EF of 55-65% Horizontal Adrian segment depression of 1 mm was noted during stress and leads V3-V6.  These changes are suggestive of ischemia, but there was motion artifact noted. No T wave inversion noted during stress Blood pressure demonstrated normal response to exercise Normal low risk study  Impression and Plan:  Anvith Mauriello has been referred for pre-anesthesia review and clearance prior to him undergoing the planned anesthetic and procedural courses. Available labs, pertinent testing, and imaging results were personally reviewed by me in preparation for upcoming operative/procedural course. [facility] medical record has been updated following extensive record review and patient interview with PAT staff.   This patient has been appropriately cleared by cardiology with an overall ACCEPTABLE risk of experiencing significant perioperative cardiovascular complications. Based on clinical review performed today (08/31/22), barring any significant acute changes in the patient's overall condition, it is anticipated that he will be able to proceed with the planned surgical intervention. Any acute changes in clinical condition may necessitate his procedure being postponed and/or cancelled. Patient will meet with anesthesia team (MD and/or CRNA) on the day of his procedure for preoperative evaluation/assessment. Questions regarding anesthetic course will be  fielded at that time.   Pre-surgical instructions were reviewed with the patient during his PAT appointment, and questions were fielded to satisfaction by PAT clinical staff. He has been instructed on which medications that he will need to hold prior to surgery, as well as the ones that have been deemed safe/appropriate to take on the day of his procedure. As part of the general education provided by PAT, patient made aware both verbally and in writing, that he would need to abstain from the  use of any illegal substances during his perioperative course.  He was advised that failure to follow the provided instructions could necessitate case cancellation or result in serious perioperative complications up to and including death. Patient encouraged to contact PAT and/or his surgeon's office to discuss any questions or concerns that may arise prior to surgery; verbalized understanding.   Adrian Mulling, MSN, APRN, FNP-C, CEN [facility]  Peri-operative Services Nurse Practitioner Phone: [phone] Fax: [phone] 08/31/22 1:44 PM  NOTE: This note has been prepared using Dragon dictation software. Despite my best ability to proofread, there is always the potential that unintentional transcriptional errors may still occur from this process.

## 2022-08-29 NOTE — Patient Instructions (Addendum)
Your procedure is scheduled on: Wednesday, June 12 Report to the Registration Desk on the 1st floor of the [facility] To find out your arrival time, please call [phone] between 1PM - 3PM on: Tuesday, June 11 If your arrival time is 6:00 am, do not arrive before that time as the [facility] entrance doors do not open until 6:00 am.  REMEMBER: Instructions that are not followed completely may result in serious medical risk, up to and including death; or upon the discretion of your surgeon and anesthesiologist your surgery may need to be rescheduled.  Do not eat food after midnight the night before surgery.  No gum chewing or hard candies.  You may however, drink CLEAR liquids up to 2 hours before you are scheduled to arrive for your surgery. Do not drink anything within 2 hours of your scheduled arrival time.  Clear liquids include: - water  - apple juice without pulp - gatorade (not RED colors) - black coffee or tea (Do NOT add milk or creamers to the coffee or tea) Do NOT drink anything that is not on this list.  One week prior to surgery: starting June 5 Stop ANY OVER THE COUNTER supplements until after surgery. Vitamin B complex, calcium, vitamin D, magnesium, niacin You may however, continue to take Tylenol if needed for pain up until the day of surgery.  Per Dr. Lucienne Capers note: ok to stay on aspirin 81mg  and NSAIDS  Continue taking all prescribed medications   TAKE ONLY THESE MEDICATIONS THE MORNING OF SURGERY WITH A SIP OF WATER:  Amlodipine Ezetimibe (Zetia) Famotidine (Pepcid) - (take one the night before and one on the morning of surgery - helps to prevent nausea after surgery.) Gabapentin Levothyroxine Prednisone Rosuvastatin (Crestor)  No Alcohol for 24 hours before or after surgery.  No Smoking including e-cigarettes for 24 hours before surgery.  No chewable tobacco products for at least 6 hours before surgery.  No nicotine patches on the day of  surgery.  Do not use any "recreational" drugs for at least a week (preferably 2 weeks) before your surgery.  Please be advised that the combination of cocaine and anesthesia may have negative outcomes, up to and including death. If you test positive for cocaine, your surgery will be cancelled.  On the morning of surgery brush your teeth with toothpaste and water, you may rinse your mouth with mouthwash if you wish. Do not swallow any toothpaste or mouthwash.  Use CHG Soap as directed on instruction sheet.  Do not wear jewelry.  Do not wear lotions, powders, or perfumes.   Do not shave body hair from the neck down 48 hours before surgery.  Contact lenses, hearing aids and dentures may not be worn into surgery.  Do not bring valuables to the hospital. [facility] is not responsible for any missing/lost belongings or valuables.   Notify your doctor if there is any change in your medical condition (cold, fever, infection).  Wear comfortable clothing (specific to your surgery type) to the hospital.  After surgery, you can help prevent lung complications by doing breathing exercises.  Take deep breaths and cough every 1-2 hours. Your doctor may order a device called an Incentive Spirometer to help you take deep breaths.  If you are being admitted to the hospital overnight, leave your suitcase in the car. After surgery it may be brought to your room.  In case of increased patient census, it may be necessary for you, the patient, to continue your  postoperative care in the Same Day Surgery department.  If you are being discharged the day of surgery, you will not be allowed to drive home. You will need a responsible individual to drive you home and stay with you for 24 hours after surgery.   If you are taking public transportation, you will need to have a responsible individual with you.  Please call the Pre-admissions Testing Dept. at [phone] if you have any questions about these  instructions.  Surgery Visitation Policy:  Patients having surgery or a procedure may have two visitors.  Children under the age of 42 must have an adult with them who is not the patient.  Inpatient Visitation:    Visiting hours are 7 a.m. to 8 p.m. Up to four visitors are allowed at one time in a patient room. The visitors may rotate out with other people during the day.  One visitor age 39 or older may stay with the patient overnight and must be in the room by 8 p.m.    Pre-operative 5 CHG Bath Instructions   You can play a key role in reducing the risk of infection after surgery. Your skin needs to be as free of germs as possible. You can reduce the number of germs on your skin by washing with CHG (chlorhexidine gluconate) soap before surgery. CHG is an antiseptic soap that kills germs and continues to kill germs even after washing.   DO NOT use if you have an allergy to chlorhexidine/CHG or antibacterial soaps. If your skin becomes reddened or irritated, stop using the CHG and notify one of our RNs at [phone].   Please shower with the CHG soap starting 4 days before surgery using the following schedule:     Please keep in mind the following:  DO NOT shave, including legs and underarms, starting the day of your first shower.   You may shave your face at any point before/day of surgery.  Place clean sheets on your bed the day you start using CHG soap. Use a clean washcloth (not used since being washed) for each shower. DO NOT sleep with pets once you start using the CHG.   CHG Shower Instructions:  If you choose to wash your hair and private area, wash first with your normal shampoo/soap.  After you use shampoo/soap, rinse your hair and body thoroughly to remove shampoo/soap residue.  Turn the water OFF and apply about 3 tablespoons (45 ml) of CHG soap to a CLEAN washcloth.  Apply CHG soap ONLY FROM YOUR NECK DOWN TO YOUR TOES (washing for 3-5 minutes)  DO NOT use CHG soap  on face, private areas, open wounds, or sores.  Pay special attention to the area where your surgery is being performed.  If you are having back surgery, having someone wash your back for you may be helpful. Wait 2 minutes after CHG soap is applied, then you may rinse off the CHG soap.  Pat dry with a clean towel  Put on clean clothes/pajamas   If you choose to wear lotion, please use ONLY the CHG-compatible lotions on the back of this paper.     Additional instructions for the day of surgery: DO NOT APPLY any lotions, deodorants, cologne, or perfumes.   Put on clean/comfortable clothes.  Brush your teeth.  Ask your nurse before applying any prescription medications to the skin.      CHG Compatible Lotions   Aveeno Moisturizing lotion  Cetaphil Moisturizing Cream  Cetaphil Moisturizing Lotion  Clairol Herbal Essence Moisturizing Lotion, Dry Skin  Clairol Herbal Essence Moisturizing Lotion, Extra Dry Skin  Clairol Herbal Essence Moisturizing Lotion, Normal Skin  Curel Age Defying Therapeutic Moisturizing Lotion with Alpha Hydroxy  Curel Extreme Care Body Lotion  Curel Soothing Hands Moisturizing Hand Lotion  Curel Therapeutic Moisturizing Cream, Fragrance-Free  Curel Therapeutic Moisturizing Lotion, Fragrance-Free  Curel Therapeutic Moisturizing Lotion, Original Formula  Eucerin Daily Replenishing Lotion  Eucerin Dry Skin Therapy Plus Alpha Hydroxy Crme  Eucerin Dry Skin Therapy Plus Alpha Hydroxy Lotion  Eucerin Original Crme  Eucerin Original Lotion  Eucerin Plus Crme Eucerin Plus Lotion  Eucerin TriLipid Replenishing Lotion  Keri Anti-Bacterial Hand Lotion  Keri Deep Conditioning Original Lotion Dry Skin Formula Softly Scented  Keri Deep Conditioning Original Lotion, Fragrance Free Sensitive Skin Formula  Keri Lotion Fast Absorbing Fragrance Free Sensitive Skin Formula  Keri Lotion Fast Absorbing Softly Scented Dry Skin Formula  Keri Original Lotion  Keri Skin  Renewal Lotion Keri Silky Smooth Lotion  Keri Silky Smooth Sensitive Skin Lotion  Nivea Body Creamy Conditioning Oil  Nivea Body Extra Enriched Teacher, adult education Moisturizing Lotion Nivea Crme  Nivea Skin Firming Lotion  NutraDerm 30 Skin Lotion  NutraDerm Skin Lotion  NutraDerm Therapeutic Skin Cream  NutraDerm Therapeutic Skin Lotion  ProShield Protective Hand Cream  Provon moisturizing lotion

## 2022-08-31 ENCOUNTER — Ambulatory Visit: Payer: Managed Care, Other (non HMO) | Attending: Cardiology

## 2022-08-31 DIAGNOSIS — Z01810 Encounter for preprocedural cardiovascular examination: Secondary | ICD-10-CM | POA: Diagnosis not present

## 2022-08-31 NOTE — Progress Notes (Signed)
Virtual Visit via Telephone Note   Because of Brayland Sage's co-morbid illnesses, he is at least at moderate risk for complications without adequate follow up.  This format is felt to be most appropriate for this patient at this time.  The patient did not have access to video technology/had technical difficulties with video requiring transitioning to audio format only (telephone).  All issues noted in this document were discussed and addressed.  No physical exam could be performed with this format.  Please refer to the patient's chart for his consent to telehealth for [facility].  Evaluation Performed:  Preoperative cardiovascular risk assessment _____________   Date:  08/31/2022   Patient ID:  Glenwood Pendelton, DOB 07-31-1950, MRN [id number] Patient Location:  Home Provider location:   Office  Primary Care Provider:  [name], DO Primary Cardiologist:  [name], MD  Chief Complaint / Patient Profile   72 y.o. y/o male with a h/o coronary artery disease (on CT), dilation of ascending aorta, bicuspid aortic valve, hypertension, hyperlipidemia, BPH, GERD, hypothyroidism, and Wegener's status postchemotherapy who is pending C5-7 anterior cervical discectomy and fusion and presents today for telephonic preoperative cardiovascular risk assessment.  History of Present Illness    Burle Kivett is a 72 y.o. male who presents via audio/video conferencing for a telehealth visit today.  Pt was last seen in cardiology clinic on 11/22/2021 by Dr. [name].  At that time Ge Slaski was doing well.  The patient is now pending procedure as outlined above. Since his last visit, he notices that when he exerts himself a lot he does get out of breath.  It has been going on for a while.  He is staying on aspirin throughout his procedure.  He says his hips and back typically hold him back from physical activity not his heart.  No chest pain.  Staying on asa throughout the  procedure.    Past Medical History    Past Medical History:  Diagnosis Date   Anemia    Aortic atherosclerosis (HCC)    Aortic stenosis 06/10/2017   a.) TTE 06/10/2017: EF 60-65%; very mild AS with MPG 5 mmHG; AVA (VTI) = 2.22 cm   Arthritis    Spine, Left Hip, Hands   Ascending aorta dilatation (HCC) 05/07/2016   a.) cCTA 05/07/2016: measured 41 mm; b.) TTE 06/10/2017: measured 39 mm; c.) cCTA 08/04/2020: measured 39 mm; d.) TTE 08/15/2020: measured 38 mm   BPH (benign prostatic hyperplasia)    Cervical radiculopathy    Coronary artery disease    Crohn's disease (HCC)    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HCC)    a.) exogenous 2/2 prolonged corticosteroid use   Diastolic dysfunction 08/15/2020   a.) TTE 08/15/2020: EF 60-65%, mild MR, mod AoV sclerosis, G1DD   Fatty liver disease, nonalcoholic    Gangrene of gallbladder    GERD (gastroesophageal reflux disease)    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HCC)    Heart murmur    Hemorrhoid    Hypercholesteremia    Hypertension    Hypothyroidism    IBS (irritable bowel syndrome)    Inflammatory eye disease    Long term current use of systemic steroids    a.) low dose prednisone   Long-term current use of bisphosphonate    a.) ibandronate   Lower extremity weakness    a.) s/p chemo for Wegener's   Lumbar stenosis    Lung disorder    S/P chemo for Wegener's   Osteopenia    Osteoporosis  Pre-diabetes    RBBB (right bundle branch block)    Seasonal allergies    Spinal stenosis in cervical region    Spondylolisthesis of cervical region    Wegener's granulomatosis    a.) s/p Tx with cyclophosphamide + MTX; ongoing treatment with chronic corticosteroids   Past Surgical History:  Procedure Laterality Date   CATARACT EXTRACTION W/ INTRAOCULAR LENS  IMPLANT, BILATERAL Bilateral    CHOLECYSTECTOMY     with exploration; has "metal sutures"   COLONOSCOPY N/A 10/25/2014   Procedure: COLONOSCOPY;  Surgeon: Midge Minium, MD;  Location: [facility];  Service: Gastroenterology;  Laterality: N/A;   FEMORAL REVISION Right 10/25/2021   Procedure: REVISION FEMORAL STEM RIGHT HIP;  Surgeon: Christena Flake, MD;  Location: [facility];  Service: Orthopedics;  Laterality: Right;   LASER RESURFACING Bilateral    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   Procedure: TOTAL HIP ARTHROPLASTY;  Surgeon: Christena Flake, MD;  Location: [facility];  Service: Orthopedics;  Laterality: Right;   TREATMENT FISTULA ANAL     closure with rectal advancement flap   VASECTOMY      Allergies  Allergies  Allergen Reactions   Cephalexin Itching   Penicillin G Other (See Comments)   Sulfa Antibiotics Anaphylaxis   Sulfamethoxazole-Trimethoprim Anaphylaxis   Codeine Itching, Other (See Comments) and Rash    tingling Rash and dizziness   Ampicillin Other (See Comments)    fainting   Penicillins Other (See Comments)    Passes out   Quinolones     Fluoroquinolones not recommended in the setting of aortic dilation   Prilosec [Omeprazole] Hives and Rash   Tetracyclines & Related Rash    Home Medications    Prior to Admission medications   Medication Sig Start Date End Date Taking? Authorizing Provider  acetaminophen (TYLENOL) 650 MG CR tablet Take 650 mg by mouth every 8 (eight) hours as needed for pain.    [provider]  amLODipine (NORVASC) 10 MG tablet Take 1 tablet (10 mg total) by mouth daily. 11/22/21   Antonieta Iba, MD  aspirin 81 MG tablet Take 81 mg by mouth daily. AM    [provider]  calcium carbonate (OS-CAL) 600 MG TABS tablet Take 600 mg by mouth daily. AM    [provider]  Cholecalciferol (VITAMIN D3) 25 MCG (1000 UT) CAPS Take 1 capsule by mouth daily.    [provider]  ezetimibe (ZETIA) 10 MG tablet TAKE 1 TABLET BY MOUTH DAILY 12/27/21   Antonieta Iba, MD  famotidine (PEPCID) 40 MG tablet TAKE 1 TABLET BY MOUTH DAILY 08/15/22   Karamalegos, Netta Neat, DO  FIBER PO Take 1 capsule  by mouth 2 (two) times daily.    [provider]  fluticasone (FLONASE) 50 MCG/ACT nasal spray Place 2 sprays into both nostrils daily. Use for 4-6 weeks then stop and use seasonally or as needed. 10/27/18   Karamalegos, Netta Neat, DO  gabapentin (NEURONTIN) 300 MG capsule Take 1 capsule (300 mg total) by mouth 3 (three) times daily. 03/09/22   Karamalegos, Netta Neat, DO  ibandronate (BONIVA) 150 MG tablet Take 150 mg by mouth every 30 (thirty) days. 01/26/19   [provider]  ibuprofen (ADVIL) 200 MG tablet Take 200 mg by mouth every 6 (six) hours as needed.    [provider]  levothyroxine (SYNTHROID) 25 MCG tablet Take 1 tablet (25 mcg total) by mouth daily. 03/21/22   [name], DO  loratadine (CLARITIN) 10 MG tablet Take 10 mg by mouth every morning.    [provider]  Magnesium 200 MG TABS Take 200 mg by mouth daily.    [provider]  niacin (NIASPAN) 500 MG CR tablet Take 500 mg by mouth at bedtime.    [provider]  predniSONE (DELTASONE) 5 MG tablet Take 5 mg by mouth daily.    [provider]  rosuvastatin (CRESTOR) 40 MG tablet Take 1 tablet (40 mg total) by mouth daily. 11/22/21 11/17/22  Antonieta Iba, MD  traMADol (ULTRAM) 50 MG tablet Take 1 tablet (50 mg total) by mouth every 6 (six) hours as needed. 05/11/22   [name], DO    Physical Exam    Vital Signs:  Marquavion Venhuizen does not have vital signs available for review today.  Given telephonic nature of communication, physical exam is limited. AAOx3. NAD. Normal affect.  Speech and respirations are unlabored.  Accessory Clinical Findings    None  Assessment & Plan    1.  Preoperative Cardiovascular Risk Assessment:   Mr. Anastacio's perioperative risk of a major cardiac event is 6.6% according to the Revised Cardiac Risk Index (RCRI).  Therefore, he is at low risk for perioperative complications.   His functional capacity  is good at 4.73 METs according to the Duke Activity Status Index (DASI). Recommendations: According to ACC/AHA guidelines, no further cardiovascular testing needed.  The patient may proceed to surgery at acceptable risk.   Antiplatelet and/or Anticoagulation Recommendations: The patient should remain on Aspirin without interruption.    The patient was advised that if he develops new symptoms prior to surgery to contact our office to arrange for a follow-up visit, and he verbalized understanding.  A copy of this note will be routed to requesting surgeon.  Time:   Today, I have spent 10 minutes with the patient with telehealth technology discussing medical history, symptoms, and management plan.     Sharlene Dory, PA-C  08/31/2022, 11:42 AM

## 2022-09-05 ENCOUNTER — Observation Stay
Admission: RE | Admit: 2022-09-05 | Discharge: 2022-09-06 | Disposition: A | Discharge status: Home / Self Care | Payer: Managed Care, Other (non HMO) | Attending: Neurosurgery | Admitting: Neurosurgery

## 2022-09-05 ENCOUNTER — Encounter
Admission: RE | Disposition: A | Discharge status: Home / Self Care | Payer: Self-pay | Source: Home / Self Care | Attending: Neurosurgery

## 2022-09-05 ENCOUNTER — Ambulatory Visit: Payer: Managed Care, Other (non HMO) | Admitting: Urgent Care

## 2022-09-05 ENCOUNTER — Ambulatory Visit: Payer: Managed Care, Other (non HMO)

## 2022-09-05 ENCOUNTER — Other Ambulatory Visit: Payer: Self-pay

## 2022-09-05 ENCOUNTER — Encounter: Payer: Self-pay | Admitting: Neurosurgery

## 2022-09-05 DIAGNOSIS — I2510 Atherosclerotic heart disease of native coronary artery without angina pectoris: Secondary | ICD-10-CM | POA: Diagnosis not present

## 2022-09-05 DIAGNOSIS — E039 Hypothyroidism, unspecified: Secondary | ICD-10-CM | POA: Diagnosis not present

## 2022-09-05 DIAGNOSIS — Z01818 Encounter for other preprocedural examination: Secondary | ICD-10-CM

## 2022-09-05 DIAGNOSIS — M5412 Radiculopathy, cervical region: Secondary | ICD-10-CM | POA: Diagnosis not present

## 2022-09-05 DIAGNOSIS — Q231 Congenital insufficiency of aortic valve: Secondary | ICD-10-CM

## 2022-09-05 DIAGNOSIS — Z96641 Presence of right artificial hip joint: Secondary | ICD-10-CM | POA: Insufficient documentation

## 2022-09-05 DIAGNOSIS — M4312 Spondylolisthesis, cervical region: Secondary | ICD-10-CM

## 2022-09-05 DIAGNOSIS — Z87891 Personal history of nicotine dependence: Secondary | ICD-10-CM | POA: Diagnosis not present

## 2022-09-05 DIAGNOSIS — Z79899 Other long term (current) drug therapy: Secondary | ICD-10-CM | POA: Insufficient documentation

## 2022-09-05 DIAGNOSIS — Z01812 Encounter for preprocedural laboratory examination: Secondary | ICD-10-CM

## 2022-09-05 DIAGNOSIS — Z7982 Long term (current) use of aspirin: Secondary | ICD-10-CM | POA: Insufficient documentation

## 2022-09-05 DIAGNOSIS — M4802 Spinal stenosis, cervical region: Secondary | ICD-10-CM

## 2022-09-05 DIAGNOSIS — I10 Essential (primary) hypertension: Secondary | ICD-10-CM

## 2022-09-05 DIAGNOSIS — Z981 Arthrodesis status: Secondary | ICD-10-CM

## 2022-09-05 HISTORY — DX: Radiculopathy, cervical region: M54.12

## 2022-09-05 HISTORY — DX: Spinal stenosis, cervical region: M48.02

## 2022-09-05 HISTORY — DX: Spondylolisthesis, cervical region: M43.12

## 2022-09-05 HISTORY — PX: ANTERIOR CERVICAL DECOMP/DISCECTOMY FUSION: SHX1161

## 2022-09-05 SURGERY — ANTERIOR CERVICAL DECOMPRESSION/DISCECTOMY FUSION 2 LEVELS
Anesthesia: General

## 2022-09-05 MED ORDER — FENTANYL CITRATE (PF) 100 MCG/2ML IJ SOLN
INTRAMUSCULAR | Status: DC | PRN
  Administered 2022-09-05 (×3): 50 ug via INTRAVENOUS

## 2022-09-05 MED ORDER — POLYETHYLENE GLYCOL 3350 17 G PO PACK: 17.0000 g | PACK | Freq: Every day | ORAL | Status: DC | PRN

## 2022-09-05 MED ORDER — ONDANSETRON HCL 4 MG/2ML IJ SOLN
INTRAMUSCULAR | Status: AC
  Filled 2022-09-05: qty 2

## 2022-09-05 MED ORDER — PHENYLEPHRINE HCL-NACL 20-0.9 MG/250ML-% IV SOLN
INTRAVENOUS | Status: DC | PRN
  Administered 2022-09-05: 15 ug/min via INTRAVENOUS

## 2022-09-05 MED ORDER — MIDAZOLAM HCL 2 MG/2ML IJ SOLN
INTRAMUSCULAR | Status: AC
  Filled 2022-09-05: qty 2

## 2022-09-05 MED ORDER — DOCUSATE SODIUM 100 MG PO CAPS
100.0000 mg | ORAL_CAPSULE | Freq: Two times a day (BID) | ORAL | Status: DC
  Administered 2022-09-05: 100 mg via ORAL

## 2022-09-05 MED ORDER — ONDANSETRON HCL 4 MG/2ML IJ SOLN
INTRAMUSCULAR | Status: DC | PRN
  Administered 2022-09-05: 4 mg via INTRAVENOUS

## 2022-09-05 MED ORDER — FENTANYL CITRATE (PF) 100 MCG/2ML IJ SOLN
INTRAMUSCULAR | Status: AC
  Filled 2022-09-05: qty 2

## 2022-09-05 MED ORDER — FLUTICASONE PROPIONATE 50 MCG/ACT NA SUSP: 2.0000 | Freq: Every day | NASAL | Status: DC | PRN

## 2022-09-05 MED ORDER — SUGAMMADEX SODIUM 200 MG/2ML IV SOLN
INTRAVENOUS | Status: DC | PRN
  Administered 2022-09-05: 176 mg via INTRAVENOUS

## 2022-09-05 MED ORDER — ONDANSETRON HCL 4 MG/2ML IJ SOLN: 4.0000 mg | Freq: Four times a day (QID) | INTRAMUSCULAR | Status: DC | PRN

## 2022-09-05 MED ORDER — OXYCODONE HCL 5 MG PO TABS
5.0000 mg | ORAL_TABLET | ORAL | Status: DC | PRN
  Administered 2022-09-05: 5 mg via ORAL

## 2022-09-05 MED ORDER — MAGNESIUM OXIDE -MG SUPPLEMENT 400 (240 MG) MG PO TABS
200.0000 mg | ORAL_TABLET | Freq: Every day | ORAL | Status: DC
  Administered 2022-09-06: 200 mg via ORAL
  Filled 2022-09-05: qty 0.5

## 2022-09-05 MED ORDER — LIDOCAINE HCL (PF) 2 % IJ SOLN
INTRAMUSCULAR | Status: AC
  Filled 2022-09-05: qty 5

## 2022-09-05 MED ORDER — GABAPENTIN 300 MG PO CAPS
ORAL_CAPSULE | ORAL | Status: AC
  Filled 2022-09-05: qty 1

## 2022-09-05 MED ORDER — OXYCODONE HCL 5 MG PO TABS
ORAL_TABLET | ORAL | Status: AC
  Filled 2022-09-05: qty 1

## 2022-09-05 MED ORDER — ROSUVASTATIN CALCIUM 20 MG PO TABS
40.0000 mg | ORAL_TABLET | Freq: Every day | ORAL | Status: DC
  Administered 2022-09-06: 40 mg via ORAL

## 2022-09-05 MED ORDER — FAMOTIDINE 20 MG PO TABS
40.0000 mg | ORAL_TABLET | Freq: Every day | ORAL | Status: DC
  Administered 2022-09-06: 40 mg via ORAL

## 2022-09-05 MED ORDER — SODIUM CHLORIDE 0.9% FLUSH: 3.0000 mL | INTRAVENOUS | Status: DC | PRN

## 2022-09-05 MED ORDER — ONDANSETRON HCL 4 MG PO TABS: 4.0000 mg | ORAL_TABLET | Freq: Four times a day (QID) | ORAL | Status: DC | PRN

## 2022-09-05 MED ORDER — DEXAMETHASONE SODIUM PHOSPHATE 10 MG/ML IJ SOLN
INTRAMUSCULAR | Status: AC
  Filled 2022-09-05: qty 1

## 2022-09-05 MED ORDER — CEFAZOLIN SODIUM-DEXTROSE 2-4 GM/100ML-% IV SOLN
INTRAVENOUS | Status: AC
  Filled 2022-09-05: qty 100

## 2022-09-05 MED ORDER — AMLODIPINE BESYLATE 5 MG PO TABS
10.0000 mg | ORAL_TABLET | Freq: Every day | ORAL | Status: DC
  Administered 2022-09-06: 10 mg via ORAL

## 2022-09-05 MED ORDER — 0.9 % SODIUM CHLORIDE (POUR BTL) OPTIME
TOPICAL | Status: DC | PRN
  Administered 2022-09-05: 250 mL

## 2022-09-05 MED ORDER — LORATADINE 10 MG PO TABS
10.0000 mg | ORAL_TABLET | ORAL | Status: DC
  Administered 2022-09-06: 10 mg via ORAL

## 2022-09-05 MED ORDER — SENNA 8.6 MG PO TABS
ORAL_TABLET | ORAL | Status: AC
  Filled 2022-09-05: qty 1

## 2022-09-05 MED ORDER — DOCUSATE SODIUM 100 MG PO CAPS
ORAL_CAPSULE | ORAL | Status: AC
  Filled 2022-09-05: qty 1

## 2022-09-05 MED ORDER — DEXAMETHASONE SODIUM PHOSPHATE 10 MG/ML IJ SOLN
INTRAMUSCULAR | Status: DC | PRN
  Administered 2022-09-05: 10 mg via INTRAVENOUS

## 2022-09-05 MED ORDER — ORAL CARE MOUTH RINSE: 15.0000 mL | Freq: Once | OROMUCOSAL | Status: AC

## 2022-09-05 MED ORDER — CHLORHEXIDINE GLUCONATE 0.12 % MT SOLN
OROMUCOSAL | Status: AC
  Filled 2022-09-05: qty 15

## 2022-09-05 MED ORDER — METHOCARBAMOL 1000 MG/10ML IJ SOLN
500.0000 mg | Freq: Four times a day (QID) | INTRAVENOUS | Status: DC | PRN
  Administered 2022-09-05: 500 mg via INTRAVENOUS
  Filled 2022-09-05: qty 500

## 2022-09-05 MED ORDER — SODIUM CHLORIDE 0.9 % IV SOLN: 250.0000 mL | INTRAVENOUS | Status: DC

## 2022-09-05 MED ORDER — ACETAMINOPHEN 10 MG/ML IV SOLN
INTRAVENOUS | Status: DC | PRN
  Administered 2022-09-05: 1000 mg via INTRAVENOUS

## 2022-09-05 MED ORDER — SUCCINYLCHOLINE CHLORIDE 200 MG/10ML IV SOSY
PREFILLED_SYRINGE | INTRAVENOUS | Status: AC
  Filled 2022-09-05: qty 10

## 2022-09-05 MED ORDER — MIDAZOLAM HCL 2 MG/2ML IJ SOLN
INTRAMUSCULAR | Status: DC | PRN
  Administered 2022-09-05: 2 mg via INTRAVENOUS

## 2022-09-05 MED ORDER — LEVOTHYROXINE SODIUM 25 MCG PO TABS
25.0000 ug | ORAL_TABLET | Freq: Every day | ORAL | Status: DC
  Administered 2022-09-06: 25 ug via ORAL
  Filled 2022-09-05: qty 1

## 2022-09-05 MED ORDER — SURGIFLO WITH THROMBIN (HEMOSTATIC MATRIX KIT) OPTIME
TOPICAL | Status: DC | PRN
  Administered 2022-09-05: 1 via TOPICAL

## 2022-09-05 MED ORDER — BUPIVACAINE-EPINEPHRINE (PF) 0.5% -1:200000 IJ SOLN
INTRAMUSCULAR | Status: DC | PRN
  Administered 2022-09-05: 7 mL

## 2022-09-05 MED ORDER — CALCIUM CARBONATE 1250 (500 CA) MG PO TABS
1250.0000 mg | ORAL_TABLET | Freq: Every day | ORAL | Status: DC
  Filled 2022-09-05: qty 1

## 2022-09-05 MED ORDER — SODIUM CHLORIDE 0.9% FLUSH
3.0000 mL | Freq: Two times a day (BID) | INTRAVENOUS | Status: DC
  Administered 2022-09-05 – 2022-09-06 (×2): 3 mL via INTRAVENOUS

## 2022-09-05 MED ORDER — BUPIVACAINE-EPINEPHRINE (PF) 0.5% -1:200000 IJ SOLN
INTRAMUSCULAR | Status: AC
  Filled 2022-09-05: qty 10

## 2022-09-05 MED ORDER — BISACODYL 10 MG RE SUPP: 10.0000 mg | Freq: Every day | RECTAL | Status: DC | PRN

## 2022-09-05 MED ORDER — ACETAMINOPHEN 500 MG PO TABS
1000.0000 mg | ORAL_TABLET | Freq: Four times a day (QID) | ORAL | Status: DC
  Administered 2022-09-06 (×2): 1000 mg via ORAL

## 2022-09-05 MED ORDER — PREDNISONE 5 MG PO TABS
5.0000 mg | ORAL_TABLET | Freq: Every day | ORAL | Status: DC
  Administered 2022-09-06: 5 mg via ORAL
  Filled 2022-09-05: qty 1

## 2022-09-05 MED ORDER — LACTATED RINGERS IV SOLN: INTRAVENOUS | Status: DC

## 2022-09-05 MED ORDER — ROCURONIUM BROMIDE 100 MG/10ML IV SOLN
INTRAVENOUS | Status: DC | PRN
  Administered 2022-09-05: 60 mg via INTRAVENOUS

## 2022-09-05 MED ORDER — KETOROLAC TROMETHAMINE 15 MG/ML IJ SOLN
7.5000 mg | Freq: Four times a day (QID) | INTRAMUSCULAR | Status: AC
  Administered 2022-09-05 – 2022-09-06 (×4): 7.5 mg via INTRAVENOUS

## 2022-09-05 MED ORDER — CEFAZOLIN SODIUM-DEXTROSE 2-4 GM/100ML-% IV SOLN
2.0000 g | Freq: Once | INTRAVENOUS | Status: AC
  Administered 2022-09-05: 2 g via INTRAVENOUS

## 2022-09-05 MED ORDER — METHOCARBAMOL 500 MG PO TABS
500.0000 mg | ORAL_TABLET | Freq: Four times a day (QID) | ORAL | Status: DC | PRN
  Administered 2022-09-05: 500 mg via ORAL

## 2022-09-05 MED ORDER — LIDOCAINE HCL (CARDIAC) PF 100 MG/5ML IV SOSY
PREFILLED_SYRINGE | INTRAVENOUS | Status: DC | PRN
  Administered 2022-09-05: 80 mg via INTRAVENOUS

## 2022-09-05 MED ORDER — PHENYLEPHRINE HCL-NACL 20-0.9 MG/250ML-% IV SOLN
INTRAVENOUS | Status: AC
  Filled 2022-09-05: qty 250

## 2022-09-05 MED ORDER — PHENOL 1.4 % MT LIQD: 1.0000 | OROMUCOSAL | Status: DC | PRN

## 2022-09-05 MED ORDER — PROPOFOL 10 MG/ML IV BOLUS
INTRAVENOUS | Status: AC
  Filled 2022-09-05: qty 20

## 2022-09-05 MED ORDER — METHOCARBAMOL 500 MG PO TABS
ORAL_TABLET | ORAL | Status: AC
  Filled 2022-09-05: qty 1

## 2022-09-05 MED ORDER — MENTHOL 3 MG MT LOZG
1.0000 | LOZENGE | OROMUCOSAL | Status: DC | PRN
  Filled 2022-09-05 (×2): qty 9

## 2022-09-05 MED ORDER — KETOROLAC TROMETHAMINE 15 MG/ML IJ SOLN
INTRAMUSCULAR | Status: AC
  Filled 2022-09-05: qty 1

## 2022-09-05 MED ORDER — OXYCODONE HCL 5 MG PO TABS
10.0000 mg | ORAL_TABLET | ORAL | Status: DC | PRN
  Administered 2022-09-05 – 2022-09-06 (×2): 10 mg via ORAL

## 2022-09-05 MED ORDER — OXYCODONE HCL 5 MG/5ML PO SOLN: 5.0000 mg | Freq: Once | ORAL | Status: AC | PRN

## 2022-09-05 MED ORDER — OXYCODONE HCL 5 MG PO TABS
5.0000 mg | ORAL_TABLET | Freq: Once | ORAL | Status: AC | PRN
  Administered 2022-09-05: 5 mg via ORAL

## 2022-09-05 MED ORDER — ACETAMINOPHEN 10 MG/ML IV SOLN
INTRAVENOUS | Status: AC
  Filled 2022-09-05: qty 100

## 2022-09-05 MED ORDER — ENOXAPARIN SODIUM 40 MG/0.4ML IJ SOSY
40.0000 mg | PREFILLED_SYRINGE | INTRAMUSCULAR | Status: DC
  Administered 2022-09-06: 40 mg via SUBCUTANEOUS

## 2022-09-05 MED ORDER — EZETIMIBE 10 MG PO TABS
10.0000 mg | ORAL_TABLET | Freq: Every day | ORAL | Status: DC
  Administered 2022-09-06: 10 mg via ORAL

## 2022-09-05 MED ORDER — ROCURONIUM BROMIDE 10 MG/ML (PF) SYRINGE
PREFILLED_SYRINGE | INTRAVENOUS | Status: AC
  Filled 2022-09-05: qty 10

## 2022-09-05 MED ORDER — GABAPENTIN 300 MG PO CAPS
300.0000 mg | ORAL_CAPSULE | Freq: Three times a day (TID) | ORAL | Status: DC
  Administered 2022-09-05 – 2022-09-06 (×3): 300 mg via ORAL

## 2022-09-05 MED ORDER — SENNA 8.6 MG PO TABS
1.0000 | ORAL_TABLET | Freq: Two times a day (BID) | ORAL | Status: DC
  Administered 2022-09-05: 8.6 mg via ORAL

## 2022-09-05 MED ORDER — PROPOFOL 10 MG/ML IV BOLUS
INTRAVENOUS | Status: DC | PRN
  Administered 2022-09-05: 200 mg via INTRAVENOUS

## 2022-09-05 MED ORDER — NIACIN ER (ANTIHYPERLIPIDEMIC) 500 MG PO TBCR
500.0000 mg | EXTENDED_RELEASE_TABLET | Freq: Every day | ORAL | Status: DC
  Administered 2022-09-05: 500 mg via ORAL
  Filled 2022-09-05 (×2): qty 1

## 2022-09-05 MED ORDER — FENTANYL CITRATE (PF) 100 MCG/2ML IJ SOLN
25.0000 ug | INTRAMUSCULAR | Status: DC | PRN
  Administered 2022-09-05 (×2): 50 ug via INTRAVENOUS

## 2022-09-05 MED ORDER — CHLORHEXIDINE GLUCONATE 0.12 % MT SOLN
15.0000 mL | Freq: Once | OROMUCOSAL | Status: AC
  Administered 2022-09-05: 15 mL via OROMUCOSAL

## 2022-09-05 MED ORDER — VITAMIN D 25 MCG (1000 UNIT) PO TABS
1000.0000 [IU] | ORAL_TABLET | Freq: Every day | ORAL | Status: DC
  Administered 2022-09-06: 1000 [IU] via ORAL

## 2022-09-05 MED ORDER — OXYCODONE HCL 5 MG PO TABS
ORAL_TABLET | ORAL | Status: AC
  Filled 2022-09-05: qty 2

## 2022-09-05 MED ORDER — SODIUM CHLORIDE 0.9 % IV SOLN: INTRAVENOUS | Status: DC

## 2022-09-05 SURGICAL SUPPLY — 47 items
ADH SKN CLS APL DERMABOND .7 (GAUZE/BANDAGES/DRESSINGS) ×1
AGENT HMST KT MTR STRL THRMB (HEMOSTASIS) ×1
ALLOGRAFT BONE FIBER KORE 1CC (Bone Implant) IMPLANT
BASIN KIT SINGLE STR (MISCELLANEOUS) ×1 IMPLANT
BULB RESERV EVAC DRAIN JP 100C (MISCELLANEOUS) IMPLANT
BUR NEURO DRILL SOFT 3.0X3.8M (BURR) ×1 IMPLANT
DERMABOND ADVANCED .7 DNX12 (GAUZE/BANDAGES/DRESSINGS) ×1 IMPLANT
DRAIN CHANNEL JP 10F RND 20C F (MISCELLANEOUS) IMPLANT
DRAPE C ARM PK CFD 31 SPINE (DRAPES) ×1 IMPLANT
DRAPE LAPAROTOMY 77X122 PED (DRAPES) ×1 IMPLANT
DRAPE MICROSCOPE SPINE 48X150 (DRAPES) ×1 IMPLANT
ELECT REM PT RETURN 9FT ADLT (ELECTROSURGICAL) ×1
ELECTRODE REM PT RTRN 9FT ADLT (ELECTROSURGICAL) ×1 IMPLANT
FEE INTRAOP CADWELL SUPPLY NCS (MISCELLANEOUS) IMPLANT
FEE INTRAOP MONITOR IMPULS NCS (MISCELLANEOUS) IMPLANT
GLOVE BIOGEL PI IND STRL 6.5 (GLOVE) ×1 IMPLANT
GLOVE SURG SYN 6.5 ES PF (GLOVE) ×1 IMPLANT
GLOVE SURG SYN 6.5 PF PI (GLOVE) ×1 IMPLANT
GLOVE SURG SYN 8.5 E (GLOVE) ×3 IMPLANT
GLOVE SURG SYN 8.5 PF PI (GLOVE) ×3 IMPLANT
GOWN SRG LRG LVL 4 IMPRV REINF (GOWNS) ×1 IMPLANT
GOWN SRG XL LVL 3 NONREINFORCE (GOWNS) ×1 IMPLANT
GOWN STRL NON-REIN TWL XL LVL3 (GOWNS) ×1
GOWN STRL REIN LRG LVL4 (GOWNS) ×1
INTRAOP CADWELL SUPPLY FEE NCS (MISCELLANEOUS)
INTRAOP DISP SUPPLY FEE NCS (MISCELLANEOUS)
INTRAOP MONITOR FEE IMPULS NCS (MISCELLANEOUS)
KIT TURNOVER KIT A (KITS) ×1 IMPLANT
MANIFOLD NEPTUNE II (INSTRUMENTS) ×1 IMPLANT
NS IRRIG 1000ML POUR BTL (IV SOLUTION) ×1 IMPLANT
PACK LAMINECTOMY ARMC (PACKS) ×1 IMPLANT
PAD ARMBOARD 7.5X6 YLW CONV (MISCELLANEOUS) ×2 IMPLANT
PIN CASPAR 14 (PIN) ×1 IMPLANT
PIN CASPAR 14MM (PIN) ×1
PLATE ACP 1.6X36 2LVL (Plate) IMPLANT
SCREW ACP 3.5X17 S/D VARIA (Screw) IMPLANT
SCREW ACP VA ST 4.0 X 17 (Screw) IMPLANT
SPACER C HEDRON 12X14 7M 7D (Spacer) IMPLANT
SPONGE KITTNER 5P (MISCELLANEOUS) ×1 IMPLANT
SURGIFLO W/THROMBIN 8M KIT (HEMOSTASIS) ×1 IMPLANT
SUT DVC VLOC 90 3-0 CV23 UNDY (SUTURE) IMPLANT
SUT ETHILON 3-0 FS-10 30 BLK (SUTURE) ×1
SUT VIC AB 3-0 SH 8-18 (SUTURE) ×1 IMPLANT
SUTURE EHLN 3-0 FS-10 30 BLK (SUTURE) IMPLANT
SYR 20ML LL LF (SYRINGE) ×1 IMPLANT
TAPE CLOTH 3X10 WHT NS LF (GAUZE/BANDAGES/DRESSINGS) ×3 IMPLANT
TRAP FLUID SMOKE EVACUATOR (MISCELLANEOUS) ×1 IMPLANT

## 2022-09-05 NOTE — Transfer of Care (Signed)
Immediate Anesthesia Transfer of Care Note  Patient: Adrian Zavala  Procedure(s) Performed: C5-7 ANTERIOR CERVICAL DISCECTOMY AND FUSION (HEDRON)  Patient Location: PACU  Anesthesia Type:General  Level of Consciousness: awake and alert   Airway & Oxygen Therapy: Patient Spontanous Breathing and Patient connected to face mask oxygen  Post-op Assessment: Report given to RN and Post -op Vital signs reviewed and stable  Post vital signs: Reviewed and stable  Last Vitals:  Vitals Value Taken Time  BP    Temp    Pulse 103 09/05/22 1458  Resp 10 09/05/22 1458  SpO2 94 % 09/05/22 1458  Vitals shown include unvalidated device data.  Last Pain:  Vitals:   09/05/22 1014  PainSc: 3          Complications: No notable events documented.

## 2022-09-05 NOTE — Op Note (Signed)
Indications: Adrian Zavala is a 72 y.o. male with Cervical radiculopathy M54.12 , Cervical stenosis of spinal canal M48.02 , Spondylolisthesis of cervical region M43.12   Findings: stenosis, successful decompression  Preoperative Diagnosis: Cervical radiculopathy M54.12 , Cervical stenosis of spinal canal M48.02 , Spondylolisthesis of cervical region M43.12  Postoperative Diagnosis: same   EBL: 10 ml IVF: see AR ml Drains: 2 placed Disposition: Extubated and Stable to PACU Complications: none  No foley catheter was placed.   Preoperative Note:     Preoperative Note:   Risks of surgery discussed include: infection, bleeding, stroke, coma, death, paralysis, CSF leak, nerve/spinal cord injury, numbness, tingling, weakness, complex regional pain syndrome, recurrent stenosis and/or disc herniation, vascular injury, development of instability, neck/back pain, need for further surgery, persistent symptoms, development of deformity, and the risks of anesthesia. The patient understood these risks and agreed to proceed.  Operative Note:   Procedure:  1) Anterior cervical diskectomy and fusion at C5/6 and C6/7 2) Anterior cervical instrumentation at C5 - 7 3) Placement of biomechanical devices at C5/6 and C6/7  4) Use of operative microscope 5) Use of flouroscopy   Procedure: After obtaining informed consent, the patient taken to the operating room, placed in supine position, general anesthesia induced.  The patient had a small shoulder roll placed behind their shoulders.  The patient received preop antibiotics and IV Decadron.  The patient had a neck incision outlined, was prepped and draped in usual sterile fashion. The incision was injected with local anesthetic.   An incision was opened, dissection taken down medial to the carotid artery and jugular vein, lateral to the trachea and esophagus.  The prevertebral fascia identified and a localizing x-ray demonstrated the correct level.   The longus colli were dissected laterally, and self-retaining retractors placed to open the operative field. The microscope was then brought into the field.  With this complete, distractor pins were placed in the vertebral bodies of C5 and C7. The distractor was placed, and the annuli at C5/6 and C6/7 were opened using a bovie.  Curettes and pituitary rongeurs used to remove the majority of C5/6 disk, then the drill was used to remove the posterior osteophyte and begin the foraminotomies. The nerve hook was used to elevate the posterior longitudinal ligament, which was then removed with Kerrison rongeurs to complete decompression of the spinal cord. The Kerrison rongeurs were then used to complete the foraminotomies bilaterally to decompress the nerve roots. The nerve hook could be passed out each foramen, ensuring decompression of the nerve roots. Meticulous hemostasis was obtained. A biomechanical device (Globus Hedron C 7 mm height x 14 mm width by 12 mm depth) was placed at C5/6. The device had been filled with demineralized bone matrix for aid in arthrodesis.  Please note that the procedure included removal of the disc, removal of the posterior osteophytes, and removal of the posterior longitudinal ligament to ensure decompression of the spinal cord.  Additionally, foraminotomies were performed on both sides of the spinal canal to decompress the nerve roots.  We then moved to the C6/7 level. Curettes and pituitary rongeurs used to remove the majority of the disk, then the drill was used to remove the posterior osteophyte and begin the foraminotomies. The nerve hook was used to elevate the posterior longitudinal ligament, which was then removed with Kerrison rongeurs to complete decompression of the spinal cord. The Kerrison rongeurs were then used to complete the foraminotomies bilaterally to decompress the nerve roots. The nerve hook could  be passed out each foramen, ensuring decompression of the nerve  roots. Meticulous hemostasis was obtained. A biomechanical device (Globus Hedron C 7 mm height x 14 mm width by 12 mm depth) was placed at C6/7. The device had been filled with demineralized bone matrix for aid in arthrodesis.  Please note that the procedure included removal of the disc, removal of the posterior osteophytes, and removal of the posterior longitudinal ligament to ensure decompression of the spinal cord.  Additionally, foraminotomies were performed on both sides of the spinal canal to decompress the nerve roots.  The caspar distractor was removed, and bone wax used for hemostasis. A separate, 36 mm Nuvasive ACP plate was chosen.  Two screws placed in each vertebral body, respectively making sure the screws were behind the locking mechanism.  Final AP and lateral radiographs were taken.   Please note that the plate is not inclusive to the biomechanical devices.  The anchoring mechanism of the plate is completely separate from the biomechanical devices.  With everything in good position, the wound was irrigated copiously and meticulous hemostasis obtained.  Wound was closed in 2 layers using interrupted inverted 3-0 Vicryl sutures in the platysma and 3-0 monocryl on the dermis.  The wound was dressed with dermabond, the head of bed at 30 degrees, taken to recovery room in stable condition.  No new postop neurological deficits were identified.  Sponge and pattie counts were correct at the end of the procedure.   I performed the entire procedure with the assistance of Manning Charity PA as an Designer, television/film set. An assistant was required for this procedure due to the complexity.  The assistant provided assistance in tissue manipulation and suction, and was required for the successful and safe performance of the procedure. I performed the critical portions of the procedure.   Venetia Night MD

## 2022-09-05 NOTE — Anesthesia Procedure Notes (Signed)
Procedure Name: Intubation Date/Time: 09/05/2022 12:52 PM  Performed by: Malva Cogan, CRNAPre-anesthesia Checklist: Patient identified, Patient being monitored, Timeout performed, Emergency Drugs available and Suction available Patient Re-evaluated:Patient Re-evaluated prior to induction Oxygen Delivery Method: Circle system utilized Preoxygenation: Pre-oxygenation with 100% oxygen Induction Type: IV induction Ventilation: Mask ventilation without difficulty Laryngoscope Size: McGraph and 4 Grade View: Grade II Tube type: Oral Tube size: 7.5 mm Number of attempts: 1 Airway Equipment and Method: Stylet and Video-laryngoscopy Placement Confirmation: ETT inserted through vocal cords under direct vision, positive ETCO2 and breath sounds checked- equal and bilateral Secured at: 21 cm Tube secured with: Tape Dental Injury: Teeth and Oropharynx as per pre-operative assessment

## 2022-09-05 NOTE — Interval H&P Note (Signed)
History and Physical Interval Note:  09/05/2022 11:15 AM  Adrian Zavala  has presented today for surgery, with the diagnosis of Cervical radiculopathy M54.12  Cervical stenosis of spinal canal M48.02  Spondylolisthesis of cervical region M43.12.  The various methods of treatment have been discussed with the patient and family. After consideration of risks, benefits and other options for treatment, the patient has consented to  Procedure(s): C5-7 ANTERIOR CERVICAL DISCECTOMY AND FUSION (HEDRON) (N/A) as a surgical intervention.  The patient's history has been reviewed, patient examined, no change in status, stable for surgery.  I have reviewed the patient's chart and labs.  Questions were answered to the patient's satisfaction.    Heart sounds normal no MRG. Chest Clear to Auscultation Bilaterally.   [name]

## 2022-09-05 NOTE — Anesthesia Postprocedure Evaluation (Signed)
Anesthesia Post Note  Patient: Adrian Zavala  Procedure(s) Performed: C5-7 ANTERIOR CERVICAL DISCECTOMY AND FUSION (HEDRON)  Patient location during evaluation: PACU Anesthesia Type: General Level of consciousness: awake and alert Pain management: pain level controlled Vital Signs Assessment: post-procedure vital signs reviewed and stable Respiratory status: spontaneous breathing, nonlabored ventilation, respiratory function stable and patient connected to nasal cannula oxygen Cardiovascular status: blood pressure returned to baseline and stable Postop Assessment: no apparent nausea or vomiting Anesthetic complications: no  No notable events documented.   Last Vitals:  Vitals:   09/05/22 1458 09/05/22 1500  BP: (!) 151/74 (!) 149/76  Pulse: 98 98  Resp: 10 19  Temp: 36.5 C   SpO2: 96% 96%    Last Pain:  Vitals:   09/05/22 1500  PainSc: Asleep                 Stephanie Coup

## 2022-09-05 NOTE — Anesthesia Preprocedure Evaluation (Signed)
Anesthesia Evaluation  Patient identified by MRN, date of birth, ID band Patient awake    Reviewed: Allergy & Precautions, NPO status , Patient's Chart, lab work & pertinent test results  History of Anesthesia Complications Negative for: history of anesthetic complications  Airway Mallampati: III  TM Distance: >3 FB Neck ROM: full    Dental  (+) Dental Advidsory Given, Poor Dentition, Missing   Pulmonary neg pulmonary ROS, neg sleep apnea, neg COPD, Patient abstained from smoking.Not current smoker, former smoker   Pulmonary exam normal breath sounds clear to auscultation       Cardiovascular Exercise Tolerance: Good METShypertension, Pt. on medications + CAD and +CHF  (-) Past MI Normal cardiovascular exam(-) dysrhythmias + Valvular Problems/Murmurs  Rhythm:Regular Rate:Normal - Systolic murmurs Most recent TTE was performed on 08/15/2020 revealing a normal left ventricular systolic function with an EF of 60 to 65%.  There were no regional wall motion abnormalities. Diastolic Doppler parameters consistent with abnormal relaxation (G1DD).  Mild mitral valve regurgitation noted.  Moderate aortic valve sclerosis/calcification present with no evidence of aortic valve stenosis.  Borderline dilatation of the ascending aorta noted with a maximum diameter of 38 mm. Of note, previous echocardiogram from 06/10/2017 revealed very mild aortic valve stenosis with a mean pressure gradient of 5 mmHg; AVA (VTI) = 2.22 cm.     Neuro/Psych negative neurological ROS  negative psych ROS   GI/Hepatic Neg liver ROS,GERD  Medicated and Controlled,,  Endo/Other  neg diabetesHypothyroidism    Renal/GU negative Renal ROS     Musculoskeletal  (+) Arthritis , Osteoarthritis,    Abdominal   Peds  Hematology  (+) anemia   Anesthesia Other Findings Past Medical History: No date: Anemia No date: Aortic atherosclerosis (HCC) 06/10/2017: Aortic  stenosis     Comment:  a.) TTE 06/10/2017: EF 60-65%; very mild AS with MPG 5               mmHG; AVA (VTI) = 2.22 cm No date: Arthritis     Comment:  Spine, Left Hip, Hands 05/07/2016: Ascending aorta dilatation (HCC)     Comment:  a.) cCTA 05/07/2016: measured 41 mm; b.) TTE 06/10/2017:              measured 39 mm; c.) cCTA 08/04/2020: measured 39 mm; d.)               TTE 08/15/2020: measured 38 mm No date: BPH (benign prostatic hyperplasia) No date: Cervical radiculopathy No date: Coronary artery disease No date: Crohn's disease (HCC) No date: 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HCC)     Comment:  a.) exogenous 2/2 prolonged corticosteroid use 08/15/2020: Diastolic dysfunction     Comment:  a.) TTE 08/15/2020: EF 60-65%, mild MR, mod AoV               sclerosis, G1DD No date: Fatty liver disease, nonalcoholic No date: Gangrene of gallbladder No date: GERD (gastroesophageal reflux disease) No date: 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HCC) No date: Heart murmur No date: Hemorrhoid No date: Hypercholesteremia No date: Hypertension No date: Hypothyroidism No date: IBS (irritable bowel syndrome) No date: Inflammatory eye disease No date: Long term current use of systemic steroids     Comment:  a.) low dose prednisone No date: Long-term current use of bisphosphonate     Comment:  a.) ibandronate No date: Lower extremity weakness     Comment:  a.) s/p chemo for Wegener's No date: Lumbar stenosis No date: Lung disorder     Comment:  S/P chemo for Wegener's No date: Osteopenia No date: Osteoporosis No date: Pre-diabetes No date: RBBB (right bundle branch block) No date: Seasonal allergies No date: Spinal stenosis in cervical region No date: Spondylolisthesis of cervical region 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     Comment:  a.) s/p Tx with cyclophosphamide + MTX; ongoing               treatment with chronic corticosteroids  Past Surgical History: No date: CATARACT EXTRACTION W/ INTRAOCULAR  LENS  IMPLANT, BILATERAL;  Bilateral No date: CHOLECYSTECTOMY     Comment:  with exploration; has "metal sutures" 10/25/2014: COLONOSCOPY; N/A     Comment:  Procedure: COLONOSCOPY;  Surgeon: Midge Minium, MD;                Location: [facility];  Service:               Gastroenterology;  Laterality: N/A; 10/25/2021: FEMORAL REVISION; Right     Comment:  Procedure: REVISION FEMORAL STEM RIGHT HIP;  Surgeon:               Christena Flake, MD;  Location: [facility];  Service:               Orthopedics;  Laterality: Right; No date: LASER RESURFACING; Bilateral 10/24/2021: TOTAL HIP ARTHROPLASTY; Right     Comment:  Procedure: TOTAL HIP ARTHROPLASTY;  Surgeon: Christena Flake, MD;  Location: [facility];  Service: Orthopedics;                Laterality: Right; No date: TREATMENT FISTULA ANAL     Comment:  closure with rectal advancement flap No date: VASECTOMY     Reproductive/Obstetrics negative OB ROS                             Anesthesia Physical Anesthesia Plan  ASA: 3  Anesthesia Plan: General ETT   Post-op Pain Management:    Induction: Intravenous  PONV Risk Score and Plan: 2 and Ondansetron, Midazolam and Dexamethasone  Airway Management Planned: Oral ETT  Additional Equipment: None  Intra-op Plan:   Post-operative Plan: Extubation in OR  Informed Consent: I have reviewed the patients History and Physical, chart, labs and discussed the procedure including the risks, benefits and alternatives for the proposed anesthesia with the patient or authorized representative who has indicated his/her understanding and acceptance.     Dental Advisory Given  Plan Discussed with: Anesthesiologist, CRNA and Surgeon  Anesthesia Plan Comments: (Patient consented for risks of anesthesia including but not limited to:  - adverse reactions to medications - damage to eyes, teeth, lips or other oral mucosa - nerve damage due to positioning   - sore throat or hoarseness - Damage to heart, brain, nerves, lungs, other parts of body or loss of life  Patient voiced understanding.)        Anesthesia Quick Evaluation

## 2022-09-06 ENCOUNTER — Other Ambulatory Visit: Payer: Self-pay

## 2022-09-06 ENCOUNTER — Emergency Department
Admission: EM | Admit: 2022-09-06 | Discharge: 2022-09-07 | Disposition: A | Discharge status: Home / Self Care | Payer: Managed Care, Other (non HMO) | Attending: Emergency Medicine | Admitting: Emergency Medicine

## 2022-09-06 ENCOUNTER — Encounter: Payer: Self-pay | Admitting: Neurosurgery

## 2022-09-06 DIAGNOSIS — M9689 Other intraoperative and postprocedural complications and disorders of the musculoskeletal system: Secondary | ICD-10-CM | POA: Insufficient documentation

## 2022-09-06 DIAGNOSIS — Z7982 Long term (current) use of aspirin: Secondary | ICD-10-CM | POA: Insufficient documentation

## 2022-09-06 DIAGNOSIS — T888XXA Other specified complications of surgical and medical care, not elsewhere classified, initial encounter: Secondary | ICD-10-CM

## 2022-09-06 LAB — BASIC METABOLIC PANEL
Anion gap: 9 (ref 5–15)
BUN: 23 mg/dL (ref 8–23)
CO2: 24 mmol/L (ref 22–32)
Calcium: 9.6 mg/dL (ref 8.9–10.3)
Chloride: 107 mmol/L (ref 98–111)
Creatinine, Ser: 1.17 mg/dL (ref 0.61–1.24)
GFR, Estimated: >60 mL/min (ref >60)
Glucose, Bld: 140 mg/dL — ABNORMAL HIGH (ref 70–99)
Potassium: 4 mmol/L (ref 3.5–5.1)
Sodium: 140 mmol/L (ref 135–145)

## 2022-09-06 LAB — CBC WITH DIFFERENTIAL/PLATELET
Abs Immature Granulocytes: 0.33 10*3/uL — ABNORMAL HIGH (ref 0.00–0.07)
Basophils Absolute: 0.1 10*3/uL (ref 0.0–0.1)
Basophils Relative: 0 %
Eosinophils Absolute: 0 10*3/uL (ref 0.0–0.5)
Eosinophils Relative: 0 %
HCT: 40 % (ref 39.0–52.0)
Hemoglobin: 13.2 g/dL (ref 13.0–17.0)
Immature Granulocytes: 2 %
Lymphocytes Relative: 4 %
Lymphs Abs: 0.9 10*3/uL (ref 0.7–4.0)
MCH: 31.7 pg (ref 26.0–34.0)
MCHC: 33 g/dL (ref 30.0–36.0)
MCV: 96.2 fL (ref 80.0–100.0)
Monocytes Absolute: 2 10*3/uL — ABNORMAL HIGH (ref 0.1–1.0)
Monocytes Relative: 9 %
Neutro Abs: 17.7 10*3/uL — ABNORMAL HIGH (ref 1.7–7.7)
Neutrophils Relative %: 85 %
Platelets: 226 10*3/uL (ref 150–400)
RBC: 4.16 MIL/uL — ABNORMAL LOW (ref 4.22–5.81)
RDW: 13.2 % (ref 11.5–15.5)
WBC: 21 10*3/uL — ABNORMAL HIGH (ref 4.0–10.5)
nRBC: 0 % (ref 0.0–0.2)

## 2022-09-06 LAB — LACTIC ACID, PLASMA: Lactic Acid, Venous: 1.2 mmol/L (ref 0.5–1.9)

## 2022-09-06 MED ORDER — ROSUVASTATIN CALCIUM 20 MG PO TABS
ORAL_TABLET | ORAL | Status: AC
  Filled 2022-09-06: qty 2

## 2022-09-06 MED ORDER — METHOCARBAMOL 500 MG PO TABS: 500.0000 mg | ORAL_TABLET | Freq: Four times a day (QID) | ORAL | 0 refills | Status: AC | PRN

## 2022-09-06 MED ORDER — SENNA 8.6 MG PO TABS: 1.0000 | ORAL_TABLET | Freq: Every day | ORAL | 0 refills | Status: DC | PRN

## 2022-09-06 MED ORDER — OXYCODONE HCL 5 MG PO TABS: 5.0000 mg | ORAL_TABLET | ORAL | 0 refills | Status: DC | PRN

## 2022-09-06 MED ORDER — SENNA 8.6 MG PO TABS
ORAL_TABLET | ORAL | Status: AC
  Filled 2022-09-06: qty 1

## 2022-09-06 MED ORDER — FAMOTIDINE 20 MG PO TABS
ORAL_TABLET | ORAL | Status: AC
  Filled 2022-09-06: qty 1

## 2022-09-06 MED ORDER — GABAPENTIN 300 MG PO CAPS
ORAL_CAPSULE | ORAL | Status: AC
  Filled 2022-09-06: qty 1

## 2022-09-06 MED ORDER — DOCUSATE SODIUM 100 MG PO CAPS
ORAL_CAPSULE | ORAL | Status: AC
  Filled 2022-09-06: qty 1

## 2022-09-06 MED ORDER — KETOROLAC TROMETHAMINE 15 MG/ML IJ SOLN
INTRAMUSCULAR | Status: AC
  Filled 2022-09-06: qty 1

## 2022-09-06 MED ORDER — EZETIMIBE 10 MG PO TABS
ORAL_TABLET | ORAL | Status: AC
  Filled 2022-09-06: qty 1

## 2022-09-06 MED ORDER — VITAMIN D 25 MCG (1000 UNIT) PO TABS
ORAL_TABLET | ORAL | Status: AC
  Filled 2022-09-06: qty 1

## 2022-09-06 MED ORDER — ACETAMINOPHEN 500 MG PO TABS
ORAL_TABLET | ORAL | Status: AC
  Filled 2022-09-06: qty 2

## 2022-09-06 MED ORDER — LORATADINE 10 MG PO TABS
ORAL_TABLET | ORAL | Status: AC
  Filled 2022-09-06: qty 1

## 2022-09-06 MED ORDER — AMLODIPINE BESYLATE 5 MG PO TABS
ORAL_TABLET | ORAL | Status: AC
  Filled 2022-09-06: qty 2

## 2022-09-06 MED ORDER — ENOXAPARIN SODIUM 40 MG/0.4ML IJ SOSY
PREFILLED_SYRINGE | INTRAMUSCULAR | Status: AC
  Filled 2022-09-06: qty 0.4

## 2022-09-06 MED ORDER — OXYCODONE HCL 5 MG PO TABS
ORAL_TABLET | ORAL | Status: AC
  Filled 2022-09-06: qty 2

## 2022-09-06 NOTE — ED Provider Notes (Signed)
[facility] Provider Note   Event Date/Time   First MD Initiated Contact with Patient 09/06/22 2302     (approximate) History  Post-op Problem  HPI Adrian Zavala is a 72 y.o. male presents history cervical spinal surgery earlier today presents with bleeding from his surgical site began approximately 2 hours prior to arrival.  Patient states that the dressing was fairly dry until about 2 hours ago when he was copious bright red blood from this wound including him to change the bandage 4 times.  On my interview, this bandage is clean and family at bedside states that there has been no bleeding 20 minutes since this bandage has been changed.  Patient states that he is not currently on any blood thinning medications but does take aspirin 81 mg daily as well as had a "injection of a thinner" earlier today after surgery. ROS: Patient currently denies any vision changes, tinnitus, difficulty speaking, facial droop, sore throat, chest pain, shortness of breath, abdominal pain, nausea/vomiting/diarrhea, dysuria, or weakness/numbness/paresthesias in any extremity   Physical Exam  Triage Vital Signs: ED Triage Vitals  Enc Vitals Group     BP 09/06/22 2221 (!) 152/74     Pulse Rate 09/06/22 2221 79     Resp 09/06/22 2221 16     Temp 09/06/22 2221 98.3 F (36.8 C)     Temp src --      SpO2 09/06/22 2221 98 %     Weight --      Height --      Head Circumference --      Peak Flow --      Pain Score 09/06/22 2221 0     Pain Loc --      Pain Edu? --      Excl. in GC? --    Most recent vital signs: Vitals:   09/06/22 2221  BP: (!) 152/74  Pulse: 79  Resp: 16  Temp: 98.3 F (36.8 C)  SpO2: 98%   General: Awake, oriented x4. CV:  Good peripheral perfusion.  Resp:  Normal effort.  Abd:  No distention.  Other:  Elderly obese Caucasian male laying in bed in no acute distress.'s dressing in place to the left upper anterior chest wall that is clean/dry/intact with  surrounding ecchymosis ED Results / Procedures / Treatments  Labs (all labs ordered are listed, but only abnormal results are displayed) Labs Reviewed  BASIC METABOLIC PANEL - Abnormal; Notable for the following components:      Result Value   Glucose, Bld 140 (*)    All other components within normal limits  CBC WITH DIFFERENTIAL/PLATELET - Abnormal; Notable for the following components:   WBC 21.0 (*)    RBC 4.16 (*)    Neutro Abs 17.7 (*)    Monocytes Absolute 2.0 (*)    Abs Immature Granulocytes 0.33 (*)    All other components within normal limits  LACTIC ACID, PLASMA  LACTIC ACID, PLASMA   PROCEDURES: Critical Care performed: No Procedures MEDICATIONS ORDERED IN ED: Medications - No data to display IMPRESSION / MDM / ASSESSMENT AND PLAN / ED COURSE  I reviewed the triage vital signs and the nursing notes.                             The patient is on the cardiac monitor to evaluate for evidence of arrhythmia and/or significant heart rate changes. Patient's presentation is most consistent  with acute presentation with potential threat to life or bodily function. Patient is a 72 year old male who presents for bleeding from his surgical site after surgery this morning.  Patient reportedly had multiple dressing changes due to excessive bleeding however throughout my examination emergency department course, patient has had no bleeding and has required no dressing changes.  Patient was monitored for over 2 hours without any increase in bleeding.  Patient was therefore instructed to hold for possible anticoagulation and antiplatelet medications until follow-up with Dr. Marcell Barlow. The patient has been reexamined and is ready to be discharged.  All diagnostic results have been reviewed and discussed with the patient/family.  Care plan has been outlined and the patient/family understands all current diagnoses, results, and treatment plans.  There are no new complaints, changes, or physical  findings at this time.  All questions have been addressed and answered.   Patient was instructed to, and agrees to follow-up with their primary care physician as well as return to the emergency department if any new or worsening symptoms develop.  Dispo: Discharge home with surgical follow-up   FINAL CLINICAL IMPRESSION(S) / ED DIAGNOSES   Final diagnoses:  Fluid collection at surgical site, initial encounter   Rx / DC Orders   ED Discharge Orders     None      Note:  This document was prepared using Dragon voice recognition software and may include unintentional dictation errors.   Merwyn Katos, MD 09/07/22 [id number]

## 2022-09-06 NOTE — ED Triage Notes (Signed)
Pt to ED via POV c/o post op problem. Pt had spine surgery yesterday and a drain was put in his left chest.  Pt drain site has been bleeding for about 2hrs. Site is still bleeding, area was redressed with gauze. Takes 81mg  aspirin everyday. Denies CP, SOB, fevers

## 2022-09-06 NOTE — Discharge Summary (Signed)
Discharge Summary  Patient ID: Adrian Zavala MRN: [id number] DOB/AGE: Oct 14, 1950 72 y.o.  Admit date: 09/05/2022 Discharge date: 09/06/2022  Admission Diagnoses: Cervical radiculopathy M54.12 , Cervical stenosis of spinal canal M48.02 , Spondylolisthesis of cervical region M43.12  Discharge Diagnoses:  Principal Problem:   S/P cervical spinal fusion Active Problems:   Cervical radiculopathy   Cervical spinal stenosis   Spondylolisthesis of cervical region   Discharged Condition: good  Hospital Course:  Adrian Zavala is a 55 ending with cervical radiculopathy status post C5 7 ACDF.  His intraoperative course was uncomplicated.  He was admitted overnight for drain output monitoring and pain control.  He was seen and evaluated by therapy and deemed appropriate for discharge home on postop day 1.  His drain output came down to an acceptable level and was removed on the morning of postop day 1.  He was given prescriptions for pain medication, muscle relaxer, and stool softener.  Consults: None  Significant Diagnostic Studies: none  Treatments: surgery: as above. Please see separately dictated operative report for further details  Discharge Exam: Blood pressure (!) 146/77, pulse 67, temperature 98 F (36.7 C), temperature source Temporal, resp. rate 16, height 5\' 6"  (1.676 m), weight 88 kg, SpO2 95 %. AA Ox3 CNI   Strength:5/5 throughout BUE  Disposition: Discharge disposition: 01-Home or Self Care       Discharge Instructions     Diet - low sodium heart healthy   Complete by: As directed    Incentive spirometry RT   Complete by: As directed       Allergies as of 09/06/2022       Reactions   Cephalexin Itching   Penicillin G Other (See Comments)   Sulfa Antibiotics Anaphylaxis   Sulfamethoxazole-trimethoprim Anaphylaxis   Codeine Itching, Other (See Comments), Rash   tingling Rash and dizziness   Ampicillin Other (See Comments)   fainting   Penicillins Other  (See Comments)   Passes out   Quinolones    Fluoroquinolones not recommended in the setting of aortic dilation   Prilosec [omeprazole] Hives, Rash   Tetracyclines & Related Rash        Medication List     STOP taking these medications    aspirin 81 MG tablet   ibuprofen 200 MG tablet Commonly known as: ADVIL   traMADol 50 MG tablet Commonly known as: ULTRAM       TAKE these medications    acetaminophen 650 MG CR tablet Commonly known as: TYLENOL Take 650 mg by mouth every 8 (eight) hours as needed for pain.   amLODipine 10 MG tablet Commonly known as: NORVASC Take 1 tablet (10 mg total) by mouth daily.   calcium carbonate 600 MG Tabs tablet Commonly known as: OS-CAL Take 600 mg by mouth daily. AM   ezetimibe 10 MG tablet Commonly known as: ZETIA TAKE 1 TABLET BY MOUTH DAILY   famotidine 40 MG tablet Commonly known as: PEPCID TAKE 1 TABLET BY MOUTH DAILY   FIBER PO Take 1 capsule by mouth 2 (two) times daily.   fluticasone 50 MCG/ACT nasal spray Commonly known as: FLONASE Place 2 sprays into both nostrils daily. Use for 4-6 weeks then stop and use seasonally or as needed.   gabapentin 300 MG capsule Commonly known as: NEURONTIN Take 1 capsule (300 mg total) by mouth 3 (three) times daily.   ibandronate 150 MG tablet Commonly known as: BONIVA Take 150 mg by mouth every 30 (thirty) days.   levothyroxine 25 MCG  tablet Commonly known as: SYNTHROID Take 1 tablet (25 mcg total) by mouth daily.   loratadine 10 MG tablet Commonly known as: CLARITIN Take 10 mg by mouth every morning.   Magnesium 200 MG Tabs Take 200 mg by mouth daily.   methocarbamol 500 MG tablet Commonly known as: ROBAXIN Take 1 tablet (500 mg total) by mouth every 6 (six) hours as needed for muscle spasms.   niacin 500 MG ER tablet Commonly known as: VITAMIN B3 Take 500 mg by mouth at bedtime.   oxyCODONE 5 MG immediate release tablet Commonly known as: Oxy  IR/ROXICODONE Take 1 tablet (5 mg total) by mouth every 4 (four) hours as needed for moderate pain ((score 4 to 6)).   predniSONE 5 MG tablet Commonly known as: DELTASONE Take 5 mg by mouth daily.   rosuvastatin 40 MG tablet Commonly known as: CRESTOR Take 1 tablet (40 mg total) by mouth daily.   senna 8.6 MG Tabs tablet Commonly known as: SENOKOT Take 1 tablet (8.6 mg total) by mouth daily as needed for mild constipation.   Vitamin D3 25 MCG (1000 UT) Caps Take 1 capsule by mouth daily.         Signed: Susanne Borders 09/06/2022, 9:12 AM

## 2022-09-06 NOTE — Progress Notes (Signed)
   Neurosurgery Progress Note  History: Adrian Zavala is s/p C5-7 ACDF  POD1: Pt reporting well-controlled neck pain this morning.  He has had resolution of his preoperative arm pain.  He is swallowing well.  Physical Exam: Vitals:   09/05/22 2158 09/06/22 0400  BP: 124/67 (!) 146/77  Pulse: 72 67  Resp: 18 16  Temp: (!) 97.5 F (36.4 C) 98 F (36.7 C)  SpO2: 94% 95%    AA Ox3 CNI  Strength:5/5 throughout BUE  Data:  Other tests/results: none  Assessment/Plan:  Adrian Zavala is a 72 year old presenting with cervical spondylolisthesis and radiculopathy status post C5-6 ACDF which was uncomplicated.  - mobilize - pain control - DVT prophylaxis - PTOT; dispo planning underway  Manning Charity PA-C Department of Neurosurgery

## 2022-09-06 NOTE — Evaluation (Signed)
Physical Therapy Evaluation Patient Details Name: Adrian Zavala MRN: [id number] DOB: 1950-09-27 Today's Date: 09/06/2022  History of Present Illness  Adrian Zavala is a 71yoM who comes to [facility] 6/12 for elective cervical spine surgery in the setting of known cervical DJD c radiculopathic/myelopathic elements. Pt is s/p C5/6, C6/7 ACDF. PMH: Rt THA s/p fall periprosthetic fracture, smoking, HTN, CAD, s/p chemo, nonalcoholic fatty liver disease, granulomatosis with polyangitis.  Clinical Impression  Pt awake, sitting up, pain well controlled. Pt demonstrates ability to perform all mobility needs for DC at a level equal to that of his presurgical status. No additional PT needs here, none recommended at DC. Encouraged pt to voice any OPPT needs felt at first postsurgical follow up. Will sign off.      Recommendations for follow up therapy are one component of a multi-disciplinary discharge planning process, led by the attending physician.  Recommendations may be updated based on patient status, additional functional criteria and insurance authorization.  Follow Up Recommendations       Assistance Recommended at Discharge PRN  Patient can return home with the following  Assist for transportation;Help with stairs or ramp for entrance    Equipment Recommendations None recommended by PT  Recommendations for Other Services       Functional Status Assessment Patient has not had a recent decline in their functional status     Precautions / Restrictions Precautions Precautions: Fall Restrictions Weight Bearing Restrictions: No      Mobility  Bed Mobility Overal bed mobility: Modified Independent             General bed mobility comments: bed features equal to those available at home    Transfers Overall transfer level: Modified independent                      Ambulation/Gait Ambulation/Gait assistance: Independent, Modified independent (Device/Increase time) Gait  Distance (Feet): 250 Feet Assistive device: Straight cane Gait Pattern/deviations: WFL(Within Functional Limits)       General Gait Details: mild echos of remot epostop Right hip weakness, antalgia. Pt reports no changes in gait compared to preoperative phase  Stairs Stairs: Yes Stairs assistance: Supervision Stair Management: One rail Right, One rail Left, Forwards, Step to pattern Number of Stairs: 8 General stair comments: still using Left leg for step-to gait 9 month spost Rt THA revision  Wheelchair Mobility    Modified Rankin (Stroke Patients Only)       Balance Overall balance assessment: Modified Independent                                           Pertinent Vitals/Pain Pain Assessment Pain Assessment: 0-10 Pain Score: 2  Pain Location: across posterior CT junction Pain Intervention(s): Monitored during session, Premedicated before session    Home Living Family/patient expects to be discharged to:: Private residence Living Arrangements: Spouse/significant other Available Help at Discharge: Family;Available 24 hours/day Type of Home: House Home Access: Stairs to enter   [employer] of Steps: 3 +1   Home Layout: One level Home Equipment: Agricultural consultant (2 wheels);Cane - single point;Wheelchair - Building surveyor      Prior Function Prior Level of Function : Working/employed             Mobility Comments: SPC for most AMB       Hand Dominance  Extremity/Trunk Assessment                Communication      Cognition Arousal/Alertness: Awake/alert Behavior During Therapy: WFL for tasks assessed/performed Overall Cognitive Status: Within Functional Limits for tasks assessed                                          General Comments      Exercises     Assessment/Plan    PT Assessment Patient does not need any further PT services  PT Problem List         PT Treatment  Interventions      PT Goals (Current goals can be found in the Care Plan section)  Acute Rehab PT Goals PT Goal Formulation: All assessment and education complete, DC therapy    Frequency       Co-evaluation               AM-PAC PT "6 Clicks" Mobility  Outcome Measure Help needed turning from your back to your side while in a flat bed without using bedrails?: A Little Help needed moving from lying on your back to sitting on the side of a flat bed without using bedrails?: A Little Help needed moving to and from a bed to a chair (including a wheelchair)?: A Little Help needed standing up from a chair using your arms (e.g., wheelchair or bedside chair)?: A Little Help needed to walk in hospital room?: A Little Help needed climbing 3-5 steps with a railing? : A Little 6 Click Score: 18    End of Session Equipment Utilized During Treatment: Gait belt Activity Tolerance: Patient tolerated treatment well;No increased pain Patient left: Other (comment) (in bathroom) Nurse Communication: Mobility status PT Visit Diagnosis: Difficulty in walking, not elsewhere classified (R26.2)    Time: 1610-9604 PT Time Calculation (min) (ACUTE ONLY): 16 min   Charges:   PT Evaluation $PT Eval Moderate Complexity: 1 Mod         9:34 AM, 09/06/22 Rosamaria Lints, PT, DPT Physical Therapist - [facility]  [phone] (ASCOM)    Alisan Dokes C 09/06/2022, 9:30 AM

## 2022-09-06 NOTE — Evaluation (Signed)
Occupational Therapy Evaluation Patient Details Name: Adrian Zavala MRN: [id number] DOB: May 21, 1950 Today's Date: 09/06/2022   History of Present Illness Adrian Zavala is a 71yoM who comes to [facility] 6/12 for elective cervical spine surgery in the setting of known cervical DJD c radiculopathic/myelopathic elements. Pt is s/p C5/6, C6/7 ACDF. PMH: Rt THA s/p fall periprosthetic fracture, smoking, HTN, CAD, s/p chemo, nonalcoholic fatty liver disease, granulomatosis with polyangitis.   Clinical Impression   Patient presenting with decreased Ind in self care, balance, functional mobility/transfers, endurance, and safety awareness. Patient reports being Ind and working at baseline. He lives with wife. He does endorse having L LE numbness prior to surgery that has since resolved. Pt performing bed mobility Independently. Further education on cervical collar and cervical precautions related to functional mobility and self care tasks. Pt demonstrates ability to don/doff cervical brace himself. Pt dons  shoes, pants, and shirt with set up A to obtain needed items but no physical assistance to perform task. Patient will benefit from acute OT to increase overall independence in the areas of ADLs, functional mobility, and safety awareness in order to safely discharge.     Recommendations for follow up therapy are one component of a multi-disciplinary discharge planning process, led by the attending physician.  Recommendations may be updated based on patient status, additional functional criteria and insurance authorization.   Assistance Recommended at Discharge PRN     Functional Status Assessment  Patient has not had a recent decline in their functional status  Equipment Recommendations  None recommended by OT       Precautions / Restrictions Precautions Precautions: Fall Restrictions Weight Bearing Restrictions: No      Mobility Bed Mobility Overal bed mobility: Modified Independent                   Transfers Overall transfer level: Modified independent                        Balance Overall balance assessment: Modified Independent                                         ADL either performed or assessed with clinical judgement   ADL Overall ADL's : Needs assistance/impaired                 Upper Body Dressing : Supervision/safety;Sitting   Lower Body Dressing: Supervision/safety;Sit to/from stand                       Vision Patient Visual Report: No change from baseline              Pertinent Vitals/Pain Pain Assessment Pain Assessment: Faces Pain Score: 2  Pain Location: surgical incision Pain Descriptors / Indicators: Discomfort Pain Intervention(s): Monitored during session     Hand Dominance Right   Extremity/Trunk Assessment Upper Extremity Assessment Upper Extremity Assessment: Overall WFL for tasks assessed   Lower Extremity Assessment Lower Extremity Assessment: Overall WFL for tasks assessed       Communication Communication Communication: No difficulties   Cognition Arousal/Alertness: Awake/alert Behavior During Therapy: WFL for tasks assessed/performed Overall Cognitive Status: Within Functional Limits for tasks assessed  Home Living Family/patient expects to be discharged to:: Private residence Living Arrangements: Spouse/significant other Available Help at Discharge: Family;Available 24 hours/day Type of Home: House Home Access: Stairs to enter [employer] of Steps: 3 +1   Home Layout: One level     Bathroom Shower/Tub: Producer, television/film/video: Standard     Home Equipment: Agricultural consultant (2 wheels);Cane - single point;Wheelchair - Civil engineer, contracting: Reacher        Prior Functioning/Environment Prior Level of Function : Working/employed;Independent/Modified  Independent             Mobility Comments: SPC for most AMB                   OT Goals(Current goals can be found in the care plan section) Acute Rehab OT Goals Patient Stated Goal: to return home OT Goal Formulation: With patient/family Time For Goal Achievement: 09/06/22 Potential to Achieve Goals: Fair  OT Frequency:         AM-PAC OT "6 Clicks" Daily Activity     Outcome Measure Help from another person eating meals?: None Help from another person taking care of personal grooming?: None Help from another person toileting, which includes using toliet, bedpan, or urinal?: None Help from another person bathing (including washing, rinsing, drying)?: None Help from another person to put on and taking off regular upper body clothing?: None Help from another person to put on and taking off regular lower body clothing?: None 6 Click Score: 24   End of Session Nurse Communication: Mobility status  Activity Tolerance: Patient tolerated treatment well Patient left: in bed;with call bell/phone within reach;with family/visitor present                   Time: 0981-1914 OT Time Calculation (min): 14 min Charges:  OT General Charges $OT Visit: 1 Visit OT Evaluation $OT Eval Low Complexity: 1 Low Jackquline Denmark, MS, OTR/L , CBIS ascom [phone]  09/06/22, 10:22 AM

## 2022-09-06 NOTE — Discharge Instructions (Signed)
Your surgeon has performed an operation on your cervical spine (neck) to relieve pressure on the spinal cord and/or nerves. This involved making an incision in the front of your neck and removing one or more of the discs that support your spine. Next, a small piece of bone, a titanium plate, and screws were used to fuse two or more of the vertebrae (bones) together.  The following are instructions to help in your recovery once you have been discharged from the hospital. Even if you feel well, it is important that you follow these activity guidelines. If you do not let your neck heal properly from the surgery, you can increase the chance of return of your symptoms and other complications.  * Do not take anti-inflammatory medications for 3 months after surgery (naproxen [Aleve], ibuprofen [Advil, Motrin], etc.). These medications can prevent your bones from healing properly.  Celebrex, if prescribed, is ok to take.  Activity    No bending, lifting, or twisting ("BLT"). Avoid lifting objects heavier than 10 pounds (gallon milk jug).  Where possible, avoid household activities that involve lifting, bending, reaching, pushing, or pulling such as laundry, vacuuming, grocery shopping, and childcare. Try to arrange for help from friends and family for these activities while your back heals.  Increase physical activity slowly as tolerated.  Taking short walks is encouraged, but avoid strenuous exercise. Do not jog, run, bicycle, lift weights, or participate in any other exercises unless specifically allowed by your doctor.  Talk to your doctor before resuming sexual activity.  You should not drive until cleared by your doctor.  Until released by your doctor, you should not return to work or school.  You should rest at home and let your body heal.   You may shower three days after your surgery.  After showering, lightly dab your incision dry. Do not take a tub bath or go swimming until approved by your  doctor at your follow-up appointment.  If your doctor ordered a cervical collar (neck brace) for you, you should wear it whenever you are out of bed. You may remove it when lying down or sleeping, but you should wear it at all other times. Not all neck surgeries require a cervical collar.  If you smoke, we strongly recommend that you quit.  Smoking has been proven to interfere with normal bone healing and will dramatically reduce the success rate of your surgery. Please contact [facility] (800-QUIT-NOW) and use the resources at www.[facility].com for assistance in stopping smoking.  Surgical Incision   If you have a dressing on your incision, you may remove it two days after your surgery. Keep your incision area clean and dry.  If you have staples or stitches on your incision, you should have a follow up scheduled for removal. If you do not have staples or stitches, you will have steri-strips (small pieces of surgical tape) or Dermabond glue. The steri-strips/glue should begin to peel away within about a week (it is fine if the steri-strips fall off before then). If the strips are still in place one week after your surgery, you may gently remove them.  Diet           You may return to your usual diet. However, you may experience discomfort when swallowing in the first month after your surgery. This is normal. You may find that softer foods are more comfortable for you to swallow. Be sure to stay hydrated.  When to Contact Us  You may experience pain in your   neck and/or pain between your shoulder blades. This is normal and should improve in the next few weeks with the help of pain medication, muscle relaxers, and rest. Some patients report that a warm compress on the back of the neck or between the shoulder blades helps.  However, should you experience any of the following, contact us immediately: New numbness or weakness Pain that is progressively getting worse, and is not relieved by your pain  medication, muscle relaxers, rest, and warm compresses Bleeding, redness, swelling, pain, or drainage from surgical incision Chills or flu-like symptoms Fever greater than 101.0 F (38.3 C) Inability to eat, drink fluids, or take medications Problems with bowel or bladder functions Difficulty breathing or shortness of breath Warmth, tenderness, or swelling in your calf Contact Information How to contact us:  If you have any questions/concerns before or after surgery, you can reach [place] at [phone], or you can send a mychart message. We can be reached by phone or mychart 8am-4pm, Monday-Friday.  *Please note: Calls after 4pm are forwarded to a third party answering service. Mychart messages are not routinely monitored during evenings, weekends, and holidays. Please call our office to contact the answering service for urgent concerns during non-business hours.

## 2022-09-07 ENCOUNTER — Ambulatory Visit: Payer: Managed Care, Other (non HMO) | Admitting: Family Medicine

## 2022-09-07 NOTE — Discharge Instructions (Addendum)
Please keep next 3 days or until instructed otherwise by your physician

## 2022-09-13 ENCOUNTER — Other Ambulatory Visit: Payer: Self-pay | Admitting: Family Medicine

## 2022-09-13 DIAGNOSIS — M150 Primary generalized (osteo)arthritis: Secondary | ICD-10-CM

## 2022-09-13 DIAGNOSIS — M159 Polyosteoarthritis, unspecified: Secondary | ICD-10-CM

## 2022-09-14 NOTE — Telephone Encounter (Signed)
Requested medication (s) are due for refill today:   No  Requested medication (s) are on the active medication list:   No   Not on med list  Discontinued 09/06/2022  Future visit scheduled:   Yes  in 4 days   Last ordered: Discontinued 09/06/2022  Non delegated refill that was discontinued.     Requested Prescriptions  Pending Prescriptions Disp Refills   traMADol (ULTRAM) 50 MG tablet [Pharmacy Med Name: TRAMADOL 50MG  TABLETS] 120 tablet     Sig: TAKE 1 TABLET(50 MG) BY MOUTH EVERY 6 HOURS AS NEEDED     Not Delegated - Analgesics:  Opioid Agonists Failed - 09/13/2022  7:23 PM      Failed - This refill cannot be delegated      Failed - Urine Drug Screen completed in last 360 days      Passed - Valid encounter within last 3 months    Recent Outpatient Visits           2 months ago Acute pain of left shoulder   [facility] Mecum, [place], [place]   5 months ago Acute non-recurrent frontal sinusitis   [place] [facility] [place], Netta Neat, DO   6 months ago Annual physical exam   [place] [facility] [name], DO   1 year ago Acquired hypothyroidism   [place] [facility] [name], [place]   1 year ago Annual physical exam   [place] [facility] Althea Charon, Netta Neat, DO       Future Appointments             In 4 days Althea Charon, Netta Neat, DO [facility], [facility]

## 2022-09-16 ENCOUNTER — Other Ambulatory Visit: Payer: Self-pay | Admitting: Family Medicine

## 2022-09-16 DIAGNOSIS — M159 Polyosteoarthritis, unspecified: Secondary | ICD-10-CM

## 2022-09-18 ENCOUNTER — Ambulatory Visit: Payer: Managed Care, Other (non HMO) | Admitting: Family Medicine

## 2022-09-18 ENCOUNTER — Encounter: Payer: Self-pay | Admitting: Family Medicine

## 2022-09-18 VITALS — BP 126/68 | HR 81 | Temp 98.1°F | Ht 66.0 in | Wt 194.6 lb

## 2022-09-18 DIAGNOSIS — M5030 Other cervical disc degeneration, unspecified cervical region: Secondary | ICD-10-CM

## 2022-09-18 DIAGNOSIS — M159 Polyosteoarthritis, unspecified: Secondary | ICD-10-CM | POA: Diagnosis not present

## 2022-09-18 DIAGNOSIS — I10 Essential (primary) hypertension: Secondary | ICD-10-CM

## 2022-09-18 DIAGNOSIS — M150 Primary generalized (osteo)arthritis: Secondary | ICD-10-CM

## 2022-09-18 MED ORDER — TRAMADOL HCL 50 MG PO TABS
50.0000 mg | ORAL_TABLET | Freq: Four times a day (QID) | ORAL | 2 refills | Status: DC | PRN
Start: 2022-09-18 — End: 2023-01-30

## 2022-09-18 NOTE — Patient Instructions (Addendum)
Thank you for coming to the office today.  Keep up with Post-op Dr Lucienne Capers office.  Continue Amlodipine 10mg  daily, can keep compression if they are successful.  Refilled Tramadol today. Okay to use the Oxy as breakthrough as needed.  DUE for FASTING BLOOD WORK (no food or drink after midnight before the lab appointment, only water or coffee without cream/sugar on the morning of)  SCHEDULE "Lab Only" visit in the morning at the clinic for lab draw in 6 MONTHS   - Make sure Lab Only appointment is at about 1 week before your next appointment, so that results will be available  For Lab Results, once available within 2-3 days of blood draw, you can can log in to MyChart online to view your results and a brief explanation. Also, we can discuss results at next follow-up visit.   Please schedule a Follow-up Appointment to: Return in about 6 months (around 03/20/2023) for 6 month fasting lab only then 1 week later Annual Physical.  If you have any other questions or concerns, please feel free to call the office or send a message through MyChart. You may also schedule an earlier appointment if necessary.  Additionally, you may be receiving a survey about your experience at our office within a few days to 1 week by e-mail or mail. We value your feedback.  Saralyn Pilar, DO [facility], [place]

## 2022-09-18 NOTE — Telephone Encounter (Signed)
Requested medications are due for refill today.  unsure  Requested medications are on the active medications list.  no  Last refill. 05/11/2022  Future visit scheduled.   yes  Notes to clinic.  Refill not delegated.    Requested Prescriptions  Pending Prescriptions Disp Refills   traMADol (ULTRAM) 50 MG tablet [Pharmacy Med Name: TRAMADOL 50MG  TABLETS] 120 tablet     Sig: TAKE 1 TABLET(50 MG) BY MOUTH EVERY 6 HOURS AS NEEDED     Not Delegated - Analgesics:  Opioid Agonists Failed - 09/16/2022 12:49 PM      Failed - This refill cannot be delegated      Failed - Urine Drug Screen completed in last 360 days      Passed - Valid encounter within last 3 months    Recent Outpatient Visits           2 months ago Acute pain of left shoulder   [place] [facility] Mecum, [place], [place]   5 months ago Acute non-recurrent frontal sinusitis   [employer] [facility] [place], Netta Neat, DO   6 months ago Annual physical exam   [place] [name], DO   1 year ago Acquired hypothyroidism   [place] [facility] [name], DO   1 year ago Annual physical exam   [place] [facility] [place], Netta Neat, DO       Future Appointments             Today Althea Charon Netta Neat, DO [facility], [facility]

## 2022-09-18 NOTE — Progress Notes (Unsigned)
Subjective:    Patient ID: Adrian Zavala, male    DOB: 11/28/1950, 72 y.o.   MRN: [id number]  Adrian Zavala is a 72 y.o. male presenting on 09/18/2022 for Hypertension   HPI  Cervical Spine DDD Followed by [facility] Neurosurgery Dr Myer Haff S/p C5-7 anterior cervical discectomy and fusion on 09/05/22 He is doing well with improved upper extremity range of motion, and reduced pain. He is in neck brace currently post op Controlled mostly on Tramadol but also has breakthrough med On oxycodone 5mg  IR as back up breakthrough pain if needed, after Tramadol if needed 3-4 hrs.  History R Hip S/p fracture repair, and periprosthetic fracture   Heart Murmur Previous history of heart murmur, has followed Cardiology  Bruit R carotid   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, hands, hips) Dr Landry Mellow [place] He had x-rays C/T spine, showed OA/DJD   He continues to take Tramadol up to 3 times daily regularly PRN for pain with good results, needs refill - He has chronic back pain with DJD bone spurs and has curvature of spine, worse with prolonged sitting at work, causing worse back pain, followed by Rheumatology - Taking Tylenol Arthritis 650mg  x 1 per dose in afternoon and evening with some relief - On Tizanidine half of 4mg  TID PRN   - Reports chronic problem with chronic joint pain and stiffness from arthritis. 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 On gabapentin 300 TID       09/18/2022   11:33 AM  03/09/2022    4:02 PM 06/28/2021    8:32 AM  Depression screen PHQ 2/9  Decreased Interest 0 0 0  Down, Depressed, Hopeless 0 0 0  PHQ - 2 Score 0 0 0  Altered sleeping 0 0 0  Tired, decreased energy 0 1 0  Change in appetite 0 0 0  Feeling bad or failure about yourself  0 0 0  Trouble concentrating 0 1 0  Moving slowly or fidgety/restless 0 0 0  Suicidal thoughts 0 0 0  PHQ-9 Score 0 2 0  Difficult doing work/chores Not difficult at all Not difficult at all Not difficult at all    Social History   Tobacco Use   Smoking status: Former    Packs/day: 1.00    Years: 30.00    Additional pack years: 0.00    Total pack years: 30.00    Types: Cigarettes    Quit date: 08/24/1996    Years since quitting: 26.0   Smokeless tobacco: Never  Vaping Use   Vaping Use: Never used  Substance Use Topics   Alcohol use: Yes    Alcohol/week: 3.0 standard drinks of alcohol    Types: 3 Cans of beer per week    Comment: occassional   Drug use: No    Review of Systems Per HPI unless specifically indicated above     Objective:    BP 126/68 (BP Location: Left Arm, Patient Position: Sitting, Cuff Size: Large)   Pulse 81   Temp  98.1 F (36.7 C) (Oral)   Ht 5\' 6"  (1.676 m)   Wt 194 lb 9.6 oz (88.3 kg)   SpO2 98%   BMI 31.41 kg/m   Wt Readings from Last 3 Encounters:  09/18/22 194 lb 9.6 oz (88.3 kg)  09/05/22 194 lb 0.1 oz (88 kg)  08/29/22 194 lb (88 kg)    Physical Exam Vitals and nursing note reviewed.  Constitutional:      General: He is not in acute distress.    Appearance: Normal appearance. He is well-developed. He is not diaphoretic.     Comments: Well-appearing, comfortable, cooperative  HENT:     Head: Normocephalic and atraumatic.  Eyes:     General:        Right eye: No discharge.        Left eye: No discharge.     Conjunctiva/sclera: Conjunctivae normal.  Neck:     Comments: Lower neck swelling and bruising, appropriately post op, has scabs and site of drainage  tube removed Cardiovascular:     Rate and Rhythm: Normal rate.  Pulmonary:     Effort: Pulmonary effort is normal.  Musculoskeletal:     Right lower leg: No edema.     Left lower leg: No edema.  Skin:    General: Skin is warm and dry.     Findings: No erythema or rash.  Neurological:     Mental Status: He is alert and oriented to person, place, and time.  Psychiatric:        Mood and Affect: Mood normal.        Behavior: Behavior normal.        Thought Content: Thought content normal.     Comments: Well groomed, good eye contact, normal speech and thoughts    Results for orders placed or performed during the hospital encounter of 09/06/22  Basic metabolic panel  Result Value Ref Range   Sodium 140 135 - 145 mmol/L   Potassium 4.0 3.5 - 5.1 mmol/L   Chloride 107 98 - 111 mmol/L   CO2 24 22 - 32 mmol/L   Glucose, Bld 140 (H) 70 - 99 mg/dL   BUN 23 8 - 23 mg/dL   Creatinine, Ser 1.61 0.61 - 1.24 mg/dL   Calcium 9.6 8.9 - 09.6 mg/dL   GFR, Estimated >04 >54 mL/min   Anion gap 9 5 - 15  CBC with Differential  Result Value Ref Range   WBC 21.0 (H) 4.0 - 10.5 K/uL   RBC 4.16 (L) 4.22 - 5.81 MIL/uL   Hemoglobin 13.2 13.0 - 17.0 g/dL   HCT 09.8 11.9 - 14.7 %   MCV 96.2 80.0 - 100.0 fL   MCH 31.7 26.0 - 34.0 pg   MCHC 33.0 30.0 - 36.0 g/dL   RDW 82.9 56.2 - 13.0 %   Platelets 226 150 - 400 K/uL   nRBC 0.0 0.0 - 0.2 %   Neutrophils Relative % 85 %   Neutro Abs 17.7 (H) 1.7 - 7.7 K/uL   Lymphocytes Relative 4 %   Lymphs Abs 0.9 0.7 - 4.0 K/uL   Monocytes Relative 9 %   Monocytes Absolute 2.0 (H) 0.1 - 1.0 K/uL   Eosinophils Relative 0 %   Eosinophils Absolute 0.0 0.0 - 0.5 K/uL   Basophils Relative 0 %   Basophils Absolute 0.1 0.0 - 0.1 K/uL   Immature Granulocytes 2 %   Abs Immature Granulocytes 0.33 (H) 0.00 - 0.07 K/uL  Lactic acid, plasma  Result Value Ref Range   Lactic Acid, Venous 1.2 0.5 - 1.9 mmol/L      Assessment & Plan:   Problem List Items Addressed  This Visit     Essential hypertension   Osteoarthritis of multiple joints   Relevant Medications   traMADol (ULTRAM) 50 MG tablet   Other Visit Diagnoses     DDD (degenerative disc disease), cervical    -  Primary   Relevant Medications   traMADol (ULTRAM) 50 MG tablet       Keep up with Post-op Dr Lucienne Capers office.  Continue Amlodipine 10mg  daily, can keep compression if they are successful.  Refilled Tramadol today. Okay to use the Oxy as breakthrough as needed.  Meds ordered this encounter  Medications   traMADol (ULTRAM) 50 MG tablet    Sig: Take 1 tablet (50 mg total) by mouth every 6 (six) hours as needed for moderate pain.    Dispense:  120 tablet    Refill:  2     Follow up plan: Return in about 6 months (around 03/20/2023) for 6 month fasting lab only then 1 week later Annual Physical.   Future labs full panel + Thyroid, B12 and Vitamin D in December.   Saralyn Pilar, DO [facility] 09/18/2022, 11:39 AM

## 2022-09-19 ENCOUNTER — Other Ambulatory Visit: Payer: Self-pay | Admitting: Family Medicine

## 2022-09-19 DIAGNOSIS — M150 Primary generalized (osteo)arthritis: Secondary | ICD-10-CM

## 2022-09-19 DIAGNOSIS — E782 Mixed hyperlipidemia: Secondary | ICD-10-CM

## 2022-09-19 DIAGNOSIS — I10 Essential (primary) hypertension: Secondary | ICD-10-CM

## 2022-09-19 DIAGNOSIS — E559 Vitamin D deficiency, unspecified: Secondary | ICD-10-CM

## 2022-09-19 DIAGNOSIS — R7309 Other abnormal glucose: Secondary | ICD-10-CM

## 2022-09-19 DIAGNOSIS — M159 Polyosteoarthritis, unspecified: Secondary | ICD-10-CM

## 2022-09-19 DIAGNOSIS — Z0000 Encounter for general adult medical examination without abnormal findings: Secondary | ICD-10-CM

## 2022-09-19 DIAGNOSIS — E039 Hypothyroidism, unspecified: Secondary | ICD-10-CM

## 2022-09-19 DIAGNOSIS — N401 Enlarged prostate with lower urinary tract symptoms: Secondary | ICD-10-CM

## 2022-09-19 DIAGNOSIS — E538 Deficiency of other specified B group vitamins: Secondary | ICD-10-CM

## 2022-09-19 NOTE — Progress Notes (Unsigned)
   REFERRING PHYSICIAN:  Kasandra Knudsen [address] [place],  [place] [address]  DOS: 09/05/22 ACDF C5-C7  HISTORY OF PRESENT ILLNESS: Adrian Zavala is 2 weeks status post  ACDF C5-C7. Given oxycodone and robaxin on discharge from the hospital. He was to stop his ultram while taking oxycodone.   Seen in ED on 09/06/22 with increased bleeding from incision. Was monitored with no further bleeding for 2 hours. He was sent home.      PHYSICAL EXAMINATION:  NEUROLOGICAL:  General: In no acute distress.   Awake, alert, oriented to person, place, and time.  Pupils equal round and reactive to light.  Facial tone is symmetric.    Strength: Side Biceps Triceps Deltoid Interossei Grip Wrist Ext. Wrist Flex.  R 5 5 5 5 5 5 5   L 4*** 4*** 5 5 5 5 5    Incision c/d/i  Imaging:  Nothing new to review.   Assessment / Plan: Adrian Zavala is doing well s/p above surgery. Treatment options reviewed with patient and following plan made:   - We discussed activity escalation and I have advised the patient to lift up to 10 pounds until 6 weeks after surgery (until follow up with Dr. Myer Haff).   - Reviewed wound care.  - Continue current medications including *** - Follow up as scheduled in 4 weeks and prn.   Advised to contact the office if any questions or concerns arise.   Drake Leach PA-C Dept of Neurosurgery

## 2022-09-20 ENCOUNTER — Ambulatory Visit (INDEPENDENT_AMBULATORY_CARE_PROVIDER_SITE_OTHER): Payer: Managed Care, Other (non HMO) | Admitting: Orthopedic Surgery

## 2022-09-20 ENCOUNTER — Encounter: Payer: Self-pay | Admitting: Orthopedic Surgery

## 2022-09-20 VITALS — BP 160/78 | HR 88 | Temp 98.4°F

## 2022-09-20 DIAGNOSIS — Z981 Arthrodesis status: Secondary | ICD-10-CM

## 2022-09-20 DIAGNOSIS — M5412 Radiculopathy, cervical region: Secondary | ICD-10-CM

## 2022-09-20 DIAGNOSIS — M4312 Spondylolisthesis, cervical region: Secondary | ICD-10-CM

## 2022-09-20 DIAGNOSIS — Z09 Encounter for follow-up examination after completed treatment for conditions other than malignant neoplasm: Secondary | ICD-10-CM

## 2022-09-20 DIAGNOSIS — M4802 Spinal stenosis, cervical region: Secondary | ICD-10-CM

## 2022-10-12 ENCOUNTER — Other Ambulatory Visit: Payer: Self-pay

## 2022-10-12 DIAGNOSIS — M4312 Spondylolisthesis, cervical region: Secondary | ICD-10-CM

## 2022-10-12 DIAGNOSIS — M4802 Spinal stenosis, cervical region: Secondary | ICD-10-CM

## 2022-10-12 DIAGNOSIS — M5412 Radiculopathy, cervical region: Secondary | ICD-10-CM

## 2022-10-16 ENCOUNTER — Ambulatory Visit (INDEPENDENT_AMBULATORY_CARE_PROVIDER_SITE_OTHER): Payer: Managed Care, Other (non HMO) | Admitting: Neurosurgery

## 2022-10-16 ENCOUNTER — Ambulatory Visit
Admission: RE | Admit: 2022-10-16 | Discharge: 2022-10-16 | Disposition: A | Discharge status: Home / Self Care | Payer: Managed Care, Other (non HMO) | Attending: Neurosurgery | Admitting: Neurosurgery

## 2022-10-16 ENCOUNTER — Ambulatory Visit
Admission: RE | Admit: 2022-10-16 | Discharge: 2022-10-16 | Disposition: A | Discharge status: Home / Self Care | Payer: Managed Care, Other (non HMO) | Source: Ambulatory Visit | Attending: Neurosurgery | Admitting: Neurosurgery

## 2022-10-16 ENCOUNTER — Encounter: Payer: Self-pay | Admitting: Neurosurgery

## 2022-10-16 VITALS — BP 130/60 | Ht 66.0 in | Wt 194.6 lb

## 2022-10-16 DIAGNOSIS — M4012 Other secondary kyphosis, cervical region: Secondary | ICD-10-CM

## 2022-10-16 DIAGNOSIS — Z09 Encounter for follow-up examination after completed treatment for conditions other than malignant neoplasm: Secondary | ICD-10-CM

## 2022-10-16 DIAGNOSIS — M4312 Spondylolisthesis, cervical region: Secondary | ICD-10-CM

## 2022-10-16 DIAGNOSIS — M5412 Radiculopathy, cervical region: Secondary | ICD-10-CM | POA: Insufficient documentation

## 2022-10-16 DIAGNOSIS — M4802 Spinal stenosis, cervical region: Secondary | ICD-10-CM

## 2022-10-16 NOTE — Progress Notes (Signed)
   REFERRING PHYSICIAN:  Kasandra Knudsen [address] [place],  [place] [address]  DOS: 09/05/22 ACDF C5-C7  HISTORY OF PRESENT ILLNESS: Torryn Hudspeth is status post  ACDF C5-C7.  His arm pain is much improved.  He is having some neck stiffness and discomfort, but is otherwise doing reasonably well.  He is much better than before surgery.  He has been compliant with his collar.    PHYSICAL EXAMINATION:  NEUROLOGICAL:  General: In no acute distress.   Awake, alert, oriented to person, place, and time.  Pupils equal round and reactive to light.  Facial tone is symmetric.    Strength: Side Biceps Triceps Deltoid Interossei Grip Wrist Ext. Wrist Flex.  R 5 5 5 5 5 5 5   L 5 5 5 5 5 5 5    Incision c/d/I  Has some bruising below incision into anterior chest that is resolving.   Imaging:  X-rays on October 16, 2022 reviewed.  There is evidence of settling at C5-6.  At C6-7, there is evidence of subsidence with a new kyphosis between C6 and C7.  Assessment / Plan: Adrian Zavala is doing well s/p above surgery.  Clinically, he is doing quite well.  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  He does have known osteoporosis that could have contributed to this.  He is currently clinically doing quite well.  He has not at significant risk of neurologic change at this time.  I will see him back in approximately 6 weeks.  He will continue wearing his collar.  Will get a CT scan if there are any changes on his x-rays in 6 weeks.  At that time, we may need to discuss a salvage procedure.     Venetia Night Dept of Neurosurgery

## 2022-10-23 ENCOUNTER — Telehealth: Payer: Self-pay | Admitting: Neurosurgery

## 2022-10-23 NOTE — Telephone Encounter (Signed)
Notified Will of Dr Lucienne Capers response.

## 2022-10-23 NOTE — Telephone Encounter (Signed)
  Media Information  Please ask Dr. Myer Haff to review.

## 2022-11-16 ENCOUNTER — Other Ambulatory Visit: Payer: Self-pay | Admitting: Cardiovascular Disease

## 2022-11-16 ENCOUNTER — Other Ambulatory Visit: Payer: Self-pay | Admitting: Family Medicine

## 2022-11-16 ENCOUNTER — Telehealth: Payer: Self-pay | Admitting: Family Medicine

## 2022-11-16 ENCOUNTER — Telehealth: Payer: Self-pay

## 2022-11-16 DIAGNOSIS — K219 Gastro-esophageal reflux disease without esophagitis: Secondary | ICD-10-CM

## 2022-11-16 DIAGNOSIS — E039 Hypothyroidism, unspecified: Secondary | ICD-10-CM

## 2022-11-16 NOTE — Telephone Encounter (Signed)
Adrian Zavala w/ [facility] calling to advise the patient's levothyroxine (SYNTHROID) 25 MCG table has a new manufacturer.  They need the dr approval to change.  Pt has already given his OK to switch. Please call  [phone]   ref no.  [id number]

## 2022-11-16 NOTE — Telephone Encounter (Signed)
Please contact patient for a follow up appointment with Dr. [name]. The patient is to be seen in August.

## 2022-11-19 NOTE — Telephone Encounter (Signed)
[facility] advised.   Thanks,   -Vernona Rieger

## 2022-11-19 NOTE — Telephone Encounter (Signed)
Requests are too soon for refill.  Requested Prescriptions  Pending Prescriptions Disp Refills   famotidine (PEPCID) 40 MG tablet [Pharmacy Med Name: Famotidine 40 MG Oral Tablet] 90 tablet 3    Sig: TAKE 1 TABLET BY MOUTH DAILY     Gastroenterology:  H2 Antagonists Passed - 11/16/2022  2:39 PM      Passed - Valid encounter within last 12 months    Recent Outpatient Visits           2 months ago DDD (degenerative disc disease), cervical   [place] [facility] [place], Netta Neat, DO   4 months ago Acute pain of left shoulder   [place] [facility] Mecum, [place], [place]   7 months ago Acute non-recurrent frontal sinusitis   [place] [facility] Althea Charon, Netta Neat, DO   8 months ago Annual physical exam   [place] [facility] [name], DO   1 year ago Acquired hypothyroidism   [place] [facility] Althea Charon, Netta Neat, DO       Future Appointments             In 4 months Althea Charon, Netta Neat, DO [place] [facility], [facility]             levothyroxine (SYNTHROID) 25 MCG tablet [Pharmacy Med Name: Levothyroxine Sodium 25 MCG Oral Tablet] 90 tablet 3    Sig: TAKE 1 TABLET BY MOUTH DAILY     Endocrinology:  Hypothyroid Agents Passed - 11/16/2022  2:39 PM      Passed - TSH in normal range and within 360 days    TSH  Date Value Ref Range Status  03/14/2022 1.890 0.450 - 4.500 uIU/mL Final         Passed - Valid encounter within last 12 months    Recent Outpatient Visits           2 months ago DDD (degenerative disc disease), cervical   [place] [facility] [place], Netta Neat, DO   4 months ago Acute pain of left shoulder   [place] [facility] Mecum, [place], [place]   7 months ago Acute non-recurrent frontal sinusitis   [place] [facility] [place], Netta Neat, DO    8 months ago Annual physical exam   [facility] [name], DO   1 year ago Acquired hypothyroidism   [place] [facility] [place], Netta Neat, DO       Future Appointments             In 4 months Althea Charon, Netta Neat, DO [place] [facility], [facility]

## 2022-11-19 NOTE — Telephone Encounter (Signed)
Okay to change to other manufactuer. Thank you  Saralyn Pilar, DO [facility] 11/19/2022, 10:05 AM

## 2022-11-23 ENCOUNTER — Other Ambulatory Visit: Payer: Self-pay

## 2022-11-23 DIAGNOSIS — M5412 Radiculopathy, cervical region: Secondary | ICD-10-CM

## 2022-11-27 ENCOUNTER — Encounter: Payer: Self-pay | Admitting: Neurosurgery

## 2022-11-27 ENCOUNTER — Ambulatory Visit (INDEPENDENT_AMBULATORY_CARE_PROVIDER_SITE_OTHER): Payer: Managed Care, Other (non HMO) | Admitting: Neurosurgery

## 2022-11-27 ENCOUNTER — Ambulatory Visit
Admission: RE | Admit: 2022-11-27 | Discharge: 2022-11-27 | Disposition: A | Discharge status: Home / Self Care | Payer: Managed Care, Other (non HMO) | Source: Ambulatory Visit | Attending: Neurosurgery | Admitting: Neurosurgery

## 2022-11-27 ENCOUNTER — Encounter: Payer: Managed Care, Other (non HMO) | Admitting: Orthopedic Surgery

## 2022-11-27 ENCOUNTER — Ambulatory Visit
Admission: RE | Admit: 2022-11-27 | Discharge: 2022-11-27 | Disposition: A | Discharge status: Home / Self Care | Payer: Managed Care, Other (non HMO) | Attending: Neurosurgery | Admitting: Neurosurgery

## 2022-11-27 VITALS — BP 134/78 | Temp 97.5°F | Ht 66.0 in | Wt 194.0 lb

## 2022-11-27 DIAGNOSIS — M5412 Radiculopathy, cervical region: Secondary | ICD-10-CM | POA: Diagnosis present

## 2022-11-27 DIAGNOSIS — M4802 Spinal stenosis, cervical region: Secondary | ICD-10-CM

## 2022-11-27 DIAGNOSIS — Z09 Encounter for follow-up examination after completed treatment for conditions other than malignant neoplasm: Secondary | ICD-10-CM

## 2022-11-27 DIAGNOSIS — Z981 Arthrodesis status: Secondary | ICD-10-CM

## 2022-11-27 NOTE — Progress Notes (Signed)
   REFERRING PHYSICIAN:  Kasandra Knudsen [address] [place],  [place] [address]  DOS: 09/05/22 ACDF C5-C7  HISTORY OF PRESENT ILLNESS: Adrian Zavala is status post  ACDF C5-C7.  He is doing well with minimal pain.    PHYSICAL EXAMINATION:  NEUROLOGICAL:  General: In no acute distress.   Awake, alert, oriented to person, place, and time.  Pupils equal round and reactive to light.  Facial tone is symmetric.    Strength: Side Biceps Triceps Deltoid Interossei Grip Wrist Ext. Wrist Flex.  R 5 5 5 5 5 5 5   L 5 5 5 5 5 5 5    Incision c/d/I  Imaging:  X-rays on October 16, 2022 reviewed.  There is evidence of settling at C5-6.  At C6-7, there is evidence of subsidence with a new kyphosis between C6 and C7.  X-rays from November 27, 2022 are stable. Assessment / Plan: Adrian Zavala is doing well s/p above surgery.  Clinically, he is doing quite well.  I remain somewhat concerned regarding the settling of the implants, but he is clinically stable.  His imaging is stable.  I will see him back in 3 months with x-rays.  I have cleared him to return to work.  He will begin weaning out of his brace.   Venetia Night Dept of Neurosurgery

## 2022-12-18 ENCOUNTER — Telehealth: Payer: Self-pay | Admitting: Cardiovascular Disease

## 2022-12-18 NOTE — Telephone Encounter (Signed)
Left voicemail to reschedule appt on 10/4, provider out of office. LS 9/24

## 2022-12-21 ENCOUNTER — Ambulatory Visit: Payer: Managed Care, Other (non HMO) | Attending: Cardiovascular Disease | Admitting: Cardiovascular Disease

## 2022-12-21 ENCOUNTER — Encounter: Payer: Self-pay | Admitting: Cardiovascular Disease

## 2022-12-21 VITALS — BP 140/64 | HR 70 | Ht 66.0 in | Wt 195.5 lb

## 2022-12-21 DIAGNOSIS — I5032 Chronic diastolic (congestive) heart failure: Secondary | ICD-10-CM | POA: Diagnosis not present

## 2022-12-21 DIAGNOSIS — I77810 Thoracic aortic ectasia: Secondary | ICD-10-CM

## 2022-12-21 DIAGNOSIS — Q231 Congenital insufficiency of aortic valve: Secondary | ICD-10-CM

## 2022-12-21 DIAGNOSIS — I10 Essential (primary) hypertension: Secondary | ICD-10-CM | POA: Diagnosis not present

## 2022-12-21 DIAGNOSIS — E785 Hyperlipidemia, unspecified: Secondary | ICD-10-CM | POA: Diagnosis not present

## 2022-12-21 DIAGNOSIS — I25118 Atherosclerotic heart disease of native coronary artery with other forms of angina pectoris: Secondary | ICD-10-CM

## 2022-12-21 DIAGNOSIS — Z79899 Other long term (current) drug therapy: Secondary | ICD-10-CM

## 2022-12-21 DIAGNOSIS — I6509 Occlusion and stenosis of unspecified vertebral artery: Secondary | ICD-10-CM

## 2022-12-21 MED ORDER — EZETIMIBE 10 MG PO TABS: 10.0000 mg | ORAL_TABLET | Freq: Every day | ORAL | 3 refills | Status: DC

## 2022-12-21 MED ORDER — ROSUVASTATIN CALCIUM 40 MG PO TABS: 40.0000 mg | ORAL_TABLET | Freq: Every day | ORAL | 3 refills | Status: DC

## 2022-12-21 MED ORDER — VALSARTAN 320 MG PO TABS: 320.0000 mg | ORAL_TABLET | Freq: Every day | ORAL | 6 refills | Status: DC

## 2022-12-21 NOTE — Patient Instructions (Addendum)
Medication Instructions:  Please hold the amlodipine Start valsartan 320 mg daily  If you need a refill on your cardiac medications before your next appointment, please call your pharmacy.   Lab work: Your provider would like for you to return in 3 weeks to have the following labs drawn: BMP.   Please go to [facility] [address] ([facility] [address], [place] [address] You do not need an appointment.  They are open from 7:30 am-4 pm.  Lunch from 1:00 pm- 2:00 pm You will not need to be fasting.   You may also go to any of these [facility] locations:  [employer]  - [address] [employer] [address] Chief Technology Officer)      Testing/Procedures: No new testing needed  Follow-Up: At [facility], you and your health needs are our priority.  As part of our continuing mission to provide you with exceptional heart care, we have created designated Provider Care Teams.  These Care Teams include your primary Cardiologist (physician) and Advanced Practice Providers (APPs -  Physician Assistants and Nurse Practitioners) who all work together to provide you with the care you need, when you need it.  You will need a follow up appointment in 12 months  Providers on your designated Care Team:   Nicolasa Ducking, NP Eula Listen, PA-C Cadence Fransico [place], [place]  COVID-19 Vaccine Information can be found at: [email or web address] For questions related to vaccine distribution or appointments, please email [email or web address] or call [phone].

## 2022-12-21 NOTE — Progress Notes (Signed)
Cardiology Office Note  Date:  12/21/2022   ID:  Adrian Zavala, DOB [birth year], MRN [id number]  PCP:  [name], DO   Chief Complaint  Patient presents with   12 month follow up     Patient c/o bilateral LE edema. Medications reviewed by the patient verbally.     HPI:  Adrian Zavala is a  72 year old Adrian Zavala with a history of  Wegener's, s/p chemo 1991,   Cushing's, on chronic prednisone,  long history of smoking who stopped in 1998,  hyperlipidemia heart murmur (Previous echocardiogram concerning for bicuspid aortic valve),  coronary artery disease, by CT  CT coronary calcium score 05/07/2016, Score of 497 Mild to moderately dilated ascending aorta 4.1 cm in 04/2016 Vertebral artery stenosis on MRI hyperlipidemia.  Coronary calcium score of 975  in 2022 Who presents for follow-up of his dilated aorta, coronary calcification, moderate RCA and LAD stenosis concern for bicuspid aortic valve  Last seen by myself in clinic August 2023  Recent cardiac imaging reviewed Cardiac CTA 5/22: CT FFR analysis didn't show any significant stenosis.   Reports has had a difficult summer Neck surgery, plates, 07/03/79 ACDF C5-C7  Concern for settling of the implants  Wearing brace more faithfully  Retiring later this year dec 2024 from [facility]  Denies significant chest pain concerning for angina No regular exercise program  right total hip arthroplasty on 10/24/2021 with Dr. Leron Croak    on Crestor 40 daily with Zetia Previous myalgias on Lipitor  EKG personally reviewed by myself on todays visit EKG Interpretation Date/Time:  Friday December 21 2022 10:36:17 EDT Ventricular Rate:  70 PR Interval:  148 QRS Duration:  132 QT Interval:  396 QTC Calculation: 427 R Axis:   2  Text Interpretation: Normal sinus rhythm Right bundle branch block When compared with ECG of 29-Aug-2022 11:00, No significant change was found Confirmed by [name] [phone]) on 12/21/2022  10:53:06 AM     CT coronary calcium score 05/07/2016 Score of 497 calcified aortic valve, Dilated ascending aorta 4.1 cm  Echo 07/2020, reviewed  1. Left ventricular ejection fraction, by estimation, is 60 to 65%. The  left ventricle has normal function. The left ventricle has no regional  wall motion abnormalities. Left ventricular diastolic parameters are  consistent with Grade I diastolic  dysfunction (impaired relaxation).   2. Right ventricular systolic function is normal. The right ventricular  size is normal. Tricuspid regurgitation signal is inadequate for assessing  PA pressure.  Mild mitral valve  regurgitation.   4. The aortic valve is normal in structure. Moderate aortic valve  sclerosis/calcification is present, without any evidence of aortic  stenosis.   5. There is borderline dilatation of the ascending aorta, measuring 38  mm.   Negative trigeminal protocol MRI of the brain, with no structural abnormality to explain patient's symptoms identified. 2. Age-appropriate cerebral atrophy with mild-to-moderate chronic microvascular ischemic disease, grossly similar to previous.  Echo 05/2017 Ascending aorta unchanged,  3.9 cm Discussed with him in detail, no significant change compared to CT scan 2018  ER 11/22/2017 Pain, tremors, lost feeling in scalp and feet Flushing, Tachycardia Previous allergy to bactrim  MRA: 2 mm suspected A-comm aneurysm, Moderate stenosis RIGHT vertebral artery origin.  Stress test 05/14/2016 performed Showing no ischemia normal ejection fraction   Wegener's, long history of chemotherapy, on chronic methotrexate once per week, on chronic prednisone   Reports Adrian Zavala's been on cholesterol medication since the 1980s, when it first came out  Prior history and the [employer], Previous issues with gallbladder disease in his 28s   episode of nausea vomiting in November 2016  PMH:   has a past medical history of Anemia, Aortic atherosclerosis  (HCC), Aortic stenosis (06/10/2017), Arthritis, Ascending aorta dilatation (HCC) (05/07/2016), BPH (benign prostatic hyperplasia), Cervical radiculopathy, Coronary artery disease, Crohn's disease (HCC), 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HCC), Diastolic dysfunction (08/15/2020), Fatty liver disease, nonalcoholic, Gangrene of gallbladder, GERD (gastroesophageal reflux disease), 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HCC), Heart murmur, Hemorrhoid, Hypercholesteremia, Hypertension, Hypothyroidism, IBS (irritable bowel syndrome), Inflammatory eye disease, Long term current use of systemic steroids, Long-term current use of bisphosphonate, Lower extremity weakness, Lumbar stenosis, Lung disorder, Osteopenia, Osteoporosis, Pre-diabetes, RBBB (right bundle branch block), Seasonal allergies, Spinal stenosis in cervical region, Spondylolisthesis of cervical region, and Wegener's granulomatosis.  PSH:    Past Surgical History:  Procedure Laterality Date   ANTERIOR CERVICAL DECOMP/DISCECTOMY FUSION N/A 09/05/2022   Procedure: C5-7 ANTERIOR CERVICAL DISCECTOMY AND FUSION (HEDRON);  Surgeon: Venetia Night, MD;  Location: [facility] ORS;  Service: Neurosurgery;  Laterality: N/A;   CATARACT EXTRACTION W/ INTRAOCULAR LENS  IMPLANT, BILATERAL Bilateral    CHOLECYSTECTOMY     with exploration; has "metal sutures"   COLONOSCOPY N/A 10/25/2014   Procedure: COLONOSCOPY;  Surgeon: Midge Minium, MD;  [facility];  Service: Gastroenterology;  Laterality: N/A;   FEMORAL REVISION Right 10/25/2021   Procedure: REVISION FEMORAL STEM RIGHT HIP;  Surgeon: Christena Flake, MD;  Location: [facility];  Service: Orthopedics;  Laterality: Right;   LASER RESURFACING Bilateral    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   Procedure: TOTAL HIP ARTHROPLASTY;  Surgeon: Christena Flake, MD;  Location: [facility] OR[facility];  Service: Orthopedics;  Laterality: Right;   TREATMENT FISTULA ANAL     closure with rectal advancement flap   VASECTOMY      Current  Outpatient Medications  Medication Sig Dispense Refill   acetaminophen (TYLENOL) 650 MG CR tablet Take 650 mg by mouth every 8 (eight) hours as needed for pain.     calcium carbonate (OS-CAL) 600 MG TABS tablet Take 600 mg by mouth daily. AM     Cholecalciferol (VITAMIN D3) 25 MCG (1000 UT) CAPS Take 1 capsule by mouth daily.     famotidine (PEPCID) 40 MG tablet TAKE 1 TABLET BY MOUTH DAILY 90 tablet 1   FIBER PO Take 1 capsule by mouth 2 (two) times daily.     fluticasone (FLONASE) 50 MCG/ACT nasal spray Place 2 sprays into both nostrils daily. Use for 4-6 weeks then stop and use seasonally or as needed. 16 g 3   gabapentin (NEURONTIN) 300 MG capsule Take 1 capsule (300 mg total) by mouth 3 (three) times daily. 270 capsule 3   ibandronate (BONIVA) 150 MG tablet Take 150 mg by mouth every 30 (thirty) days.     levothyroxine (SYNTHROID) 25 MCG tablet Take 1 tablet (25 mcg total) by mouth daily. 90 tablet 3   loratadine (CLARITIN) 10 MG tablet Take 10 mg by mouth every morning.     Magnesium 200 MG TABS Take 200 mg by mouth daily.     methocarbamol (ROBAXIN) 500 MG tablet Take 1 tablet (500 mg total) by mouth every 6 (six) hours as needed for muscle spasms. 120 tablet 0   niacin (NIASPAN) 500 MG CR tablet Take 500 mg by mouth at bedtime.     predniSONE (DELTASONE) 5 MG tablet Take 5 mg by mouth daily.     traMADol (ULTRAM) 50 MG tablet Take  1 tablet (50 mg total) by mouth every 6 (six) hours as needed for moderate pain. 120 tablet 2   valsartan (DIOVAN) 320 MG tablet Take 1 tablet (320 mg total) by mouth daily. 30 tablet 6   ezetimibe (ZETIA) 10 MG tablet Take 1 tablet (10 mg total) by mouth daily. 90 tablet 3   rosuvastatin (CRESTOR) 40 MG tablet Take 1 tablet (40 mg total) by mouth daily. 90 tablet 3   No current facility-administered medications for this visit.     Allergies:   Cephalexin, Penicillin g, Sulfa antibiotics, Sulfamethoxazole-trimethoprim, Codeine, Ampicillin, Penicillins,  Quinolones, Prilosec [omeprazole], and Tetracyclines & related   Social History:  The patient  reports that Adrian Zavala quit smoking about 26 years ago. His smoking use included cigarettes. Adrian Zavala started smoking about 56 years ago. Adrian Zavala has a 30 pack-year smoking history. Adrian Zavala has never used smokeless tobacco. Adrian Zavala reports current alcohol use of about 3.0 standard drinks of alcohol per week. Adrian Zavala reports that Adrian Zavala does not use drugs.   Family History:   Family history is unknown by patient.    Review of Systems: Review of Systems  Constitutional: Negative.   HENT: Negative.    Respiratory: Negative.    Cardiovascular: Negative.   Gastrointestinal: Negative.   Musculoskeletal:  Positive for joint pain.  Neurological: Negative.   Psychiatric/Behavioral: Negative.    All other systems reviewed and are negative.   PHYSICAL EXAM: VS:  BP (!) 140/64 (BP Location: Left Arm, Patient Position: Sitting, Cuff Size: Normal)   Pulse 70   Ht 5\' 6"  (1.676 m)   Wt 195 lb 8 oz (88.7 kg)   SpO2 96%   BMI 31.55 kg/m  , BMI Body mass index is 31.55 kg/m. Constitutional:  oriented to person, place, and time. No distress.  HENT:  Head: Grossly normal Eyes:  no discharge. No scleral icterus.  Neck: No JVD, no carotid bruits  Cardiovascular: Regular rate and rhythm, 2/6 SEM RSB Pulmonary/Chest: Clear to auscultation bilaterally, no wheezes or rails Abdominal: Soft.  no distension.  no tenderness.  Musculoskeletal: Normal range of motion Neurological:  normal muscle tone. Coordination normal. No atrophy Skin: Skin warm and dry Psychiatric: normal affect, pleasant  Recent Labs: 03/14/2022: ALT 16; TSH 1.890 09/06/2022: BUN 23; Creatinine, Ser 1.17; Hemoglobin 13.2; Platelets 226; Potassium 4.0; Sodium 140    Lipid Panel Lab Results  Component Value Date   CHOL 134 03/14/2022   HDL 61 03/14/2022   LDLCALC 50 03/14/2022   TRIG 135 03/14/2022      Wt Readings from Last 3 Encounters:  12/21/22 195 lb 8 oz (88.7  kg)  11/27/22 194 lb (88 kg)  10/16/22 194 lb 9.6 oz (88.3 kg)     ASSESSMENT AND PLAN:  Mixed hyperlipidemia Cholesterol is at goal on the current lipid regimen. No changes to the medications were made.  Essential hypertension Blood pressure is well controlled on today's visit. No changes made to the medications.  bicuspid aortic valve High suspicion of bicuspid aortic valve given long-standing murmur, calcified aortic valve, appearance of bicuspid aortic valve on echocardiogram No significant progression in disease on recent echo, last evaluated 2022  Aneurysm of ascending aorta (HCC) 4.1 cm on CT scan Echocardiogram 3.9 cm 2019 Reevaluated 2022 no change  Coronary artery disease with stable angina  significant coronary calcification score of 975 Nonobstructive disease on cardiac CTA Moderate stenosis of RCA and LAD Currently with no symptoms of angina. No further workup at this time. Continue current medication  regimen.    Total encounter time more than 30 minutes  Greater than 50% was spent in counseling and coordination of care with the patient   Orders Placed This Encounter  Procedures   Basic metabolic panel   EKG 12-Lead     Signed, Dossie Arbour, M.D., Ph.D. 12/21/2022  [facility] [place], [place] [phone]

## 2022-12-28 ENCOUNTER — Ambulatory Visit: Payer: Managed Care, Other (non HMO) | Admitting: Cardiovascular Disease

## 2023-01-09 ENCOUNTER — Other Ambulatory Visit: Payer: Self-pay | Admitting: Cardiovascular Disease

## 2023-01-09 ENCOUNTER — Other Ambulatory Visit: Payer: Self-pay | Admitting: Family Medicine

## 2023-01-09 DIAGNOSIS — K219 Gastro-esophageal reflux disease without esophagitis: Secondary | ICD-10-CM

## 2023-01-10 NOTE — Telephone Encounter (Signed)
Requested Prescriptions  Pending Prescriptions Disp Refills   famotidine (PEPCID) 40 MG tablet [Pharmacy Med Name: Famotidine 40 MG Oral Tablet] 90 tablet 0    Sig: TAKE 1 TABLET BY MOUTH DAILY     Gastroenterology:  H2 Antagonists Passed - 01/09/2023  6:40 PM      Passed - Valid encounter within last 12 months    Recent Outpatient Visits           3 months ago DDD (degenerative disc disease), cervical   [place] [facility] [place], Netta Neat, DO   6 months ago Acute pain of left shoulder   [place] [facility] Mecum, [place] E, [place]   8 months ago Acute non-recurrent frontal sinusitis   [place] [facility] [name], DO   10 months ago Annual physical exam   [place] [facility] [name], DO   1 year ago Acquired hypothyroidism   [place] [facility] [name], DO       Future Appointments             In 2 months Althea Charon, Netta Neat, DO  [facility]

## 2023-01-15 ENCOUNTER — Encounter: Payer: Self-pay | Admitting: Emergency Medicine

## 2023-01-29 ENCOUNTER — Other Ambulatory Visit: Payer: Self-pay | Admitting: Family Medicine

## 2023-01-29 DIAGNOSIS — M150 Primary generalized (osteo)arthritis: Secondary | ICD-10-CM

## 2023-01-29 DIAGNOSIS — M5030 Other cervical disc degeneration, unspecified cervical region: Secondary | ICD-10-CM

## 2023-01-30 ENCOUNTER — Other Ambulatory Visit: Payer: Self-pay

## 2023-01-30 DIAGNOSIS — M150 Primary generalized (osteo)arthritis: Secondary | ICD-10-CM

## 2023-01-30 DIAGNOSIS — M5030 Other cervical disc degeneration, unspecified cervical region: Secondary | ICD-10-CM

## 2023-01-30 MED ORDER — TRAMADOL HCL 50 MG PO TABS: 50.0000 mg | ORAL_TABLET | Freq: Four times a day (QID) | ORAL | 2 refills | Status: DC | PRN

## 2023-01-30 NOTE — Telephone Encounter (Signed)
Copied from CRM [id number]. Topic: General - Other >> Jan 30, 2023  8:35 AM Everette C wrote: Reason for CRM: The patient has called for an update on previously requested refills of traMADol (ULTRAM) 50 MG tablet [[id number]]  The patient would like to know when they can anticipate communication with their pharmacy and their PCP   Please contact further when possible

## 2023-01-30 NOTE — Telephone Encounter (Signed)
Patient is requesting call back to notify him that rx has been re ordered.  Refilled Tramadol now to his pharmacy.  Saralyn Pilar, DO [facility] 01/30/2023, 3:38 PM

## 2023-01-30 NOTE — Telephone Encounter (Signed)
Attempted to call patient to let him know Dr. Althea Charon sent in his Tramadol prescription to the pharmacy. No answer. Left message for patient to return call to office. [facility] you can tell him this information if he returns call.

## 2023-02-13 LAB — BASIC METABOLIC PANEL
BUN/Creatinine Ratio: 22 (ref 10–24)
BUN: 31 mg/dL — ABNORMAL HIGH (ref 8–27)
CO2: 20 mmol/L (ref 20–29)
Calcium: 10.8 mg/dL — ABNORMAL HIGH (ref 8.6–10.2)
Chloride: 108 mmol/L — ABNORMAL HIGH (ref 96–106)
Creatinine, Ser: 1.38 mg/dL — ABNORMAL HIGH (ref 0.76–1.27)
Glucose: 82 mg/dL (ref 70–99)
Potassium: 4.8 mmol/L (ref 3.5–5.2)
Sodium: 148 mmol/L — ABNORMAL HIGH (ref 134–144)
eGFR: 55 mL/min/{1.73_m2} — ABNORMAL LOW (ref >59)

## 2023-02-15 ENCOUNTER — Telehealth: Payer: Self-pay

## 2023-02-15 NOTE — Telephone Encounter (Addendum)
Copied from CRM [id number]. Topic: Referral - Request for Referral >> Feb 15, 2023 10:35 AM Patsy Lager T wrote: Did the patient discuss referral with their provider in the last year? No  Appointment offered? No  Type of order/referral and detailed reason for visit: Podiatrist, to remove toenail off big toe on left foot  Preference of office, provider, location: unknown  If referral order, have you been seen by this specialty before? No (If Yes, this issue or another issue? When? Where?  Can we respond through MyChart? No

## 2023-02-15 NOTE — Telephone Encounter (Signed)
I would recommend he make an appointment for evaluation first and we can place referral to podiatry at the appointment

## 2023-02-19 ENCOUNTER — Ambulatory Visit: Payer: Managed Care, Other (non HMO) | Admitting: Family Medicine

## 2023-02-19 ENCOUNTER — Other Ambulatory Visit: Payer: Self-pay | Admitting: Family Medicine

## 2023-02-19 ENCOUNTER — Encounter: Payer: Self-pay | Admitting: Family Medicine

## 2023-02-19 VITALS — BP 132/78 | Ht 66.0 in | Wt 199.2 lb

## 2023-02-19 DIAGNOSIS — N401 Enlarged prostate with lower urinary tract symptoms: Secondary | ICD-10-CM

## 2023-02-19 DIAGNOSIS — E538 Deficiency of other specified B group vitamins: Secondary | ICD-10-CM

## 2023-02-19 DIAGNOSIS — E559 Vitamin D deficiency, unspecified: Secondary | ICD-10-CM

## 2023-02-19 DIAGNOSIS — E782 Mixed hyperlipidemia: Secondary | ICD-10-CM

## 2023-02-19 DIAGNOSIS — L600 Ingrowing nail: Secondary | ICD-10-CM

## 2023-02-19 DIAGNOSIS — E039 Hypothyroidism, unspecified: Secondary | ICD-10-CM

## 2023-02-19 DIAGNOSIS — S99922A Unspecified injury of left foot, initial encounter: Secondary | ICD-10-CM

## 2023-02-19 DIAGNOSIS — I10 Essential (primary) hypertension: Secondary | ICD-10-CM

## 2023-02-19 DIAGNOSIS — R7309 Other abnormal glucose: Secondary | ICD-10-CM

## 2023-02-19 DIAGNOSIS — Z0000 Encounter for general adult medical examination without abnormal findings: Secondary | ICD-10-CM

## 2023-02-19 DIAGNOSIS — R351 Nocturia: Secondary | ICD-10-CM

## 2023-02-19 NOTE — Progress Notes (Signed)
Subjective:    Patient ID: Adrian Zavala, male    DOB: [birth year], 72 y.o.   MRN: [id number]  Adrian Zavala is a 72 y.o. male presenting on 02/19/2023 for Toe Injury (2-3 weeks ago patient was at rest stop and fell off curb and injured toes on left foot )   HPI  Discussed the use of AI scribe software for clinical note transcription with the patient, who gave verbal consent to proceed.  LEFT GREAT TOE INJURY  The patient presents with a chief complaint of a traumatic injury to the left big toenail, which occurred approximately two and a half weeks ago. The injury was sustained when the patient fell and landed on his toes. The toenail broke and is now in two pieces. The patient is unsure if the broken piece has grown under or over the remaining toenail. The patient also reports a bruise on the same foot, which is currently healing.  The patient is due for an annual check-up at the end of the year and is also due for a flu shot, which he has decided to postpone until after the holidays. The patient is also considering receiving a shingles vaccine in the future.          02/19/2023    4:33 PM 09/18/2022   11:33 AM 03/09/2022    4:02 PM  Depression screen PHQ 2/9  Decreased Interest 0 0 0  Down, Depressed, Hopeless 0 0 0  PHQ - 2 Score 0 0 0  Altered sleeping  0 0  Tired, decreased energy  0 1  Change in appetite  0 0  Feeling bad or failure about yourself   0 0  Trouble concentrating  0 1  Moving slowly or fidgety/restless  0 0  Suicidal thoughts  0 0  PHQ-9 Score  0 2  Difficult doing work/chores  Not difficult at all Not difficult at all       02/19/2023    4:33 PM 09/18/2022   11:33 AM 03/09/2022    4:02 PM 06/28/2021    8:33 AM  GAD 7 : Generalized Anxiety Score  Nervous, Anxious, on Edge 0 0 0 0  Control/stop worrying 0 0 0 0  Worry too much - different things  0 0 0  Trouble relaxing 0 0 0 0  Restless 0 0 0 0  Easily annoyed or irritable 0 0 0 0  Afraid - awful  might happen 0 0 0 0  Total GAD 7 Score  0 0 0  Anxiety Difficulty Not difficult at all Not difficult at all Not difficult at all Not difficult at all    Social History   Tobacco Use   Smoking status: Former    Current packs/day: 0.00    Average packs/day: 1 pack/day for 30.0 years (30.0 ttl pk-yrs)    Types: Cigarettes    Start date: 08/25/1966    Quit date: 08/24/1996    Years since quitting: 26.5   Smokeless tobacco: Never  Vaping Use   Vaping status: Never Used  Substance Use Topics   Alcohol use: Yes    Alcohol/week: 3.0 standard drinks of alcohol    Types: 3 Cans of beer per week    Comment: occassional   Drug use: No    Review of Systems Per HPI unless specifically indicated above     Objective:    BP 132/78   Ht 5\' 6"  (1.676 m)   Wt 199 lb 3.2 oz (90.4 kg)  BMI 32.15 kg/m   Wt Readings from Last 3 Encounters:  02/19/23 199 lb 3.2 oz (90.4 kg)  12/21/22 195 lb 8 oz (88.7 kg)  11/27/22 194 lb (88 kg)    Physical Exam Vitals and nursing note reviewed.  Constitutional:      General: He is not in acute distress.    Appearance: Normal appearance. He is well-developed. He is not diaphoretic.     Comments: Well-appearing, comfortable, cooperative  HENT:     Head: Normocephalic and atraumatic.  Eyes:     General:        Right eye: No discharge.        Left eye: No discharge.     Conjunctiva/sclera: Conjunctivae normal.  Cardiovascular:     Rate and Rhythm: Normal rate.  Pulmonary:     Effort: Pulmonary effort is normal.  Skin:    General: Skin is warm and dry.     Findings: No erythema or rash.     Comments: Left great toenail, see photo  Neurological:     Mental Status: He is alert and oriented to person, place, and time.  Psychiatric:        Mood and Affect: Mood normal.        Behavior: Behavior normal.        Thought Content: Thought content normal.     Comments: Well groomed, good eye contact, normal speech and thoughts     Left Great  Toenail    Results for orders placed or performed in visit on 12/21/22  Basic metabolic panel  Result Value Ref Range   Glucose 82 70 - 99 mg/dL   BUN 31 (H) 8 - 27 mg/dL   Creatinine, Ser 8.46 (H) 0.76 - 1.27 mg/dL   eGFR 55 (L) >96 EX/BMW/4.13   BUN/Creatinine Ratio 22 10 - 24   Sodium 148 (H) 134 - 144 mmol/L   Potassium 4.8 3.5 - 5.2 mmol/L   Chloride 108 (H) 96 - 106 mmol/L   CO2 20 20 - 29 mmol/L   Calcium 10.8 (H) 8.6 - 10.2 mg/dL      Assessment & Plan:   Problem List Items Addressed This Visit     BPH (benign prostatic hyperplasia)   Elevated hemoglobin A1c   Essential hypertension   HLD (hyperlipidemia)   Hypothyroidism   Other Visit Diagnoses     Ingrowing left great toenail    -  Primary   Relevant Orders   Ambulatory referral to Podiatry   Injury of toenail of left foot, initial encounter       Relevant Orders   Ambulatory referral to Podiatry   Vitamin D deficiency       Vitamin B12 nutritional deficiency            Traumatic Toenail Injury Trauma to the left big toenail approximately 2.5 weeks ago, resulting in a split toenail. Unclear if the new nail growth is under or over the remaining nail. No signs of infection. -Referral to [facility] for evaluation and possible toenail removal.  General Health Maintenance -Flu shot to be administered on 03/25/2023. -Order labs including T4, TSH, B12, Vitamin D, A1C, PSA, lipid panel, CBC, and CMP. -Consider Shingles vaccine after the holidays.      All lab orders released for [facility] and given to patient before his upcoming 02/2023 physical   Orders Placed This Encounter  Procedures   Ambulatory referral to Podiatry    Referral Priority:   Routine  Referral Type:   Consultation    Referral Reason:   Specialty Services Required    Requested Specialty:   Podiatry    Number of Visits Requested:   1    No orders of the defined types were placed in this encounter.   Follow up  plan: Return if symptoms worsen or fail to improve.   Saralyn Pilar, DO [facility] 02/19/2023, 4:18 PM

## 2023-02-19 NOTE — Patient Instructions (Addendum)
Please schedule a Follow-up Appointment to: Return if symptoms worsen or fail to improve.  If you have any other questions or concerns, please feel free to call the office or send a message through MyChart. You may also schedule an earlier appointment if necessary.  Additionally, you may be receiving a survey about your experience at our office within a few days to 1 week by e-mail or mail. We value your feedback.  Saralyn Pilar, DO [facility], [place]

## 2023-02-19 NOTE — Telephone Encounter (Signed)
I would be willing to see him after 4pm as a double book add on. I don't see any other opportunities before the Thanksgiving holiday.  He will just have to understand there is no room on schedule and we are adding him on to end of the day.  He will likely have to wait until closer to 430-5pm, we can see him briefly and send his referral.  Saralyn Pilar, DO [facility] 02/19/2023, 8:12 AM

## 2023-02-24 ENCOUNTER — Other Ambulatory Visit: Payer: Self-pay | Admitting: Family Medicine

## 2023-02-24 DIAGNOSIS — E039 Hypothyroidism, unspecified: Secondary | ICD-10-CM

## 2023-02-25 NOTE — Progress Notes (Unsigned)
Cardiology Office Note:  .   Date:  02/26/2023  ID:  Adrian Zavala, DOB 1951/02/21, MRN [id number] PCP: [name], DO  [facility] Providers Cardiologist:  [name], MD    History of Present Illness: .   Adrian Zavala is a 72 y.o. male with a past medical history of coronary artery calcium CT with a CAC score of 197, heart murmur (previous echocardiogram concerning for bicuspid aortic valve), mild to mildly dilated ascending aorta at 4.1 cm (2018), renal artery stenosis on MRI, hyperlipidemia, Wegener's, status post chemotherapy (1991), Cushing's on chronic prednisone, prior tobacco use (quit in 1998), trigeminal neuralgia, who presented today for follow-up.   Previously seen in April 2022.  He reported developing musculoskeletal pain in his left ribs and chest left arm.  Left arm pain which has been further for pain that started at the top of his arm.  In addition he had jaw/neck pain and pain to the left temporal.  He reported that his pain was in his muscles and right rib cage for 30+ years that started this Wegener's (1991).  coronary CTA without acute intrathoracic abnormality and showing similar appearance of granulomatous disease.  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  Recommendation was for imaging follow-up by CTA or MRI on an annual basis.  Coronary artery calcium score was 975, placing him in the 87th percentile for age and sex matched control.  A calcified plaque causing moderate stenosis was noted in the RCA and proximal LAD.  CAD RADS 3.  Moderate stenosis.  Recommendation was for symptom guided anti-ischemic pharmacotherapy, as well as risk factor modification per guideline directed care.  Additional analysis with CT FFR was submitted and reported separately, given the stenosis noted as above.  On coronary CTA, aorta was noted to be 39 mm.  Aortic valve noted to be trileaflet/tricuspid.  Subsequent CT FFR showed  no significant stenosis of the LAD with FFR 0.8.  Left circumflex FFR 0.9.  RCA FFR analysis was not able to be performed due to significant motion artifact. Echo showed EF 60 to 65%, NR WMA, G1 DD, mild MR, moderate aortic sclerosis without evidence of stenosis, and aortic dilation 38 mm.  ACDF of C5-C6-C7 and 09/05/2022.  He had plates inserted.  He was wearing the brace because there was concern for settling of the implants.   He was last seen in clinic 12/21/2022 by Dr. [name].  At that time he complained of bilateral lower extremity edema.  There were no changes made to his medications or further testing that was ordered at the time.  He returns to clinic today accompanied by a family member.  His concerns today he has been experiencing progressive shortness of breath that is exertional.  He was last seen in clinic back in September and had some mild shortness of breath at this time he has noted that is worsened.  He also has been cutting his valsartan in half because if he takes a whole dose of his currently prescribed blood pressure medication he has extreme bouts of dizziness and has almost had several episodes to where he has almost fallen but has had no falls. Denies any bleeding or noted blood in his urine or stool. He denies any chest pain, palpitations, or peripheral edema.  He just followed up with rheumatology yesterday who did labs and a chest x-ray unfortunately we are unable to see those studies at the time of his appointment.  ROS: 10 point review of systems is reviewed and considered negative except what is been listed in the HPI  Studies Reviewed: .        cCTA 07/2020 IMPRESSION: 1. Coronary calcium score of 975. This was 87th percentile for age and sex matched control. 2. Normal coronary origin with right dominance. 3. Calcified plaque causing moderate stenosis in the RCA and proximal LAD. 4. CAD-RADS 3. Moderate stenosis. Consider symptom-guided anti-ischemic pharmacotherapy  as well as risk factor modification per guideline directed care. Additional analysis with CT FFR will be submitted and reported separately. 5. Mild ascending aorta dilation, maximum diameter 39mm. 6. Aortic valve is Trileaflet/tricuspid. 1. Left Main:  No significant stenosis. 2. LAD: No significant stenosis.  FFRct 0.8 3. LCX: No significant stenosis.  FFRct 0.9 4. RCA: FFRct analysis not performed due to significant motion artifact.   IMPRESSION: 1.  CT FFR analysis didn't show any significant stenosis.   2. FFRct analysis not performed in the RCA due to significant motion artifact.   Echo 08/15/20  1. Left ventricular ejection fraction, by estimation, is 60 to 65%. The  left ventricle has normal function. The left ventricle has no regional  wall motion abnormalities. Left ventricular diastolic parameters are  consistent with Grade I diastolic  dysfunction (impaired relaxation).   2. Right ventricular systolic function is normal. The right ventricular  size is normal. Tricuspid regurgitation signal is inadequate for assessing  PA pressure.   3. The mitral valve is normal in structure. Mild mitral valve  regurgitation.   4. The aortic valve is normal in structure. Moderate aortic valve  sclerosis/calcification is present, without any evidence of aortic  stenosis.   5. There is borderline dilatation of the ascending aorta, measuring 38  mm.    Echo 06/10/2017 - Left ventricle: The cavity size was normal. Wall thickness was    normal. Systolic function was normal. The estimated ejection    fraction was in the range of 60% to 65%. Wall motion was normal;    there were no regional wall motion abnormalities. Left    ventricular diastolic function parameters were normal.  - Aortic valve: Possibly bicuspid; normal thickness, mildly    calcified leaflets. There was very mild stenosis. Mean gradient    (S): 5 mm Hg. Valve area (VTI): 2.22 cm^2.  - Aorta: Ascending aortic diameter:  39 mm (S).   MPI 05/14/2016 Blood pressure demonstrated a normal response to exercise. Horizontal ST segment depression ST segment depression of 1 mm was noted during stress in the V4, V5, V6 and V3 leads. These changes are suggestive of ischemia but there was motion artifact. No T wave inversion was noted during stress. The study is normal. This is a low risk study. The left ventricular ejection fraction is normal (55-65%).    Coronary artery calcium score 04/2016 IMPRESSION: 1. Coronary calcium score of 497. This was 96 percentile for age and sex matched control. This is a high score and further functional study with a stress test is recommended. 2. Calcified aortic valve. 3. Aneurysmal dilatation of the ascending aorta in the axial images measuring 41 mm. Further evaluation with CTA/MRA is recommended. Risk Assessment/Calculations:             Physical Exam:   VS:  BP 109/66 (BP Location: Left Arm, Patient Position: Sitting, Cuff Size: Normal)   Pulse 83   Ht 5\' 6"  (1.676 m)   Wt 198 lb 3.2 oz (89.9 kg)   SpO2 93%  BMI 31.99 kg/m    Wt Readings from Last 3 Encounters:  02/26/23 198 lb 3.2 oz (89.9 kg)  02/19/23 199 lb 3.2 oz (90.4 kg)  12/21/22 195 lb 8 oz (88.7 kg)    GEN: Well nourished, well developed in no acute distress NECK: No JVD; No carotid bruits CARDIAC: RRR, II/VI murmurs, rubs, gallops RESPIRATORY:  Clear to auscultation without rales, wheezing or rhonchi  ABDOMEN: Soft, non-tender, non-distended EXTREMITIES:  No edema; No deformity   ASSESSMENT AND PLAN: .   Progressive shortness of breath that has been worsening over the last several months.  There has been no change in his weight and he is euvolemic on exam.  No peripheral edema.  No crackles, rhonchi, or wheezing noted in lung fields.  Had labs and chest x-ray completed yesterday at his rheumatologist unfortunately we are unable to see those studies at the time of his appointment today.  Last serum  creatinine was slightly elevated and without weight gain, peripheral edema, or abnormal lung sounds and no indication for diuretic.  He has been scheduled for an updated echocardiogram, will continue to look for his studies that were completed with rheumatology yesterday.  Coronary artery disease with stable angina, significant coronary calcification with a score 975.  Nonobstructive disease on cardiac CTA.  Moderate stenosis of RCA and LAD.  Continues to deny symptoms of angina.  He is continued on ezetimibe 10 mg daily and rosuvastatin 40 mg daily.    Primary hypertension with blood pressure today 109/66.  Blood pressure has not been well-controlled.  He has continued to get his valsartan 320 mg tablet in half because of dizziness related to taking a whole tablet which is reasonable considering his blood pressure today after taking half of a tablet.  He is encouraged to continue to monitor his pressure 1 to 2 hours postmedication administration as well.  High suspicion of a bicuspid aortic valve given the longstanding murmur appearance of bicuspid aortic valve on recent echocardiogram.  With progressive shortness of breath he has been scheduled for an updated echocardiogram as his last evaluation was in 2022.  Mixed hyperlipidemia with cholesterols remained at goal on his current lipid regimen.  He is continued on ezetimibe and rosuvastatin.  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  Echocardiogram 3.9 cm in 2019 and reevaluated 2022 with no change.  Will evaluate on current study.       Dispo: Patient to return to clinic to see MD/APP after echocardiogram has been completed.  Signed, Haden Cavenaugh, NP

## 2023-02-26 ENCOUNTER — Ambulatory Visit: Payer: Managed Care, Other (non HMO) | Attending: Cardiology | Admitting: Cardiology

## 2023-02-26 ENCOUNTER — Ambulatory Visit: Payer: Managed Care, Other (non HMO) | Admitting: Neurosurgery

## 2023-02-26 ENCOUNTER — Encounter: Payer: Self-pay | Admitting: Cardiology

## 2023-02-26 VITALS — BP 109/66 | HR 83 | Ht 66.0 in | Wt 198.2 lb

## 2023-02-26 DIAGNOSIS — Q2381 Bicuspid aortic valve: Secondary | ICD-10-CM

## 2023-02-26 DIAGNOSIS — I10 Essential (primary) hypertension: Secondary | ICD-10-CM

## 2023-02-26 DIAGNOSIS — R0602 Shortness of breath: Secondary | ICD-10-CM | POA: Diagnosis not present

## 2023-02-26 DIAGNOSIS — I77810 Thoracic aortic ectasia: Secondary | ICD-10-CM

## 2023-02-26 DIAGNOSIS — R011 Cardiac murmur, unspecified: Secondary | ICD-10-CM

## 2023-02-26 DIAGNOSIS — I25118 Atherosclerotic heart disease of native coronary artery with other forms of angina pectoris: Secondary | ICD-10-CM | POA: Diagnosis not present

## 2023-02-26 DIAGNOSIS — E785 Hyperlipidemia, unspecified: Secondary | ICD-10-CM

## 2023-02-26 MED ORDER — VALSARTAN 320 MG PO TABS: 160.0000 mg | ORAL_TABLET | Freq: Every day | ORAL | Status: DC

## 2023-02-26 NOTE — Patient Instructions (Signed)
Medication Instructions:  Your physician has recommended you make the following change in your medication:  valsartan (DIOVAN) 320 MG tablet - Take 0.5 tablets (160 mg total) by mouth daily  *If you need a refill on your cardiac medications before your next appointment, please call your pharmacy*  Lab Work: - None ordered  Testing/Procedures: Your physician has requested that you have an echocardiogram. Echocardiography is a painless test that uses sound waves to create images of your heart. It provides your doctor with information about the size and shape of your heart and how well your heart's chambers and valves are working. This procedure takes approximately one hour. There are no restrictions for this procedure. Please do NOT wear cologne, perfume, aftershave, or lotions (deodorant is allowed). Please arrive 15 minutes prior to your appointment time.  Please note: We ask at that you not bring children with you during ultrasound (echo/ vascular) testing. Due to room size and safety concerns, children are not allowed in the ultrasound rooms during exams. Our front office staff cannot provide observation of children in our lobby area while testing is being conducted. An adult accompanying a patient to their appointment will only be allowed in the ultrasound room at the discretion of the ultrasound technician under special circumstances. We apologize for any inconvenience.   Follow-Up: At [facility], you and your health needs are our priority.  As part of our continuing mission to provide you with exceptional heart care, we have created designated Provider Care Teams.  These Care Teams include your primary Cardiologist (physician) and Advanced Practice Providers (APPs -  Physician Assistants and Nurse Practitioners) who all work together to provide you with the care you need, when you need it.  Your next appointment:   After echocardiogram  Provider:   Charlsie Quest, NP

## 2023-02-27 NOTE — Telephone Encounter (Signed)
Patient will run out of Rx before next scheduled appointment- request is early-but will RF once due to appointment timing.  Requested Prescriptions  Pending Prescriptions Disp Refills   levothyroxine (SYNTHROID) 25 MCG tablet [Pharmacy Med Name: Levothyroxine Sodium 25 MCG Oral Tablet] 90 tablet 3    Sig: TAKE 1 TABLET BY MOUTH DAILY     Endocrinology:  Hypothyroid Agents Passed - 02/24/2023  2:11 PM      Passed - TSH in normal range and within 360 days    TSH  Date Value Ref Range Status  03/14/2022 1.890 0.450 - 4.500 uIU/mL Final         Passed - Valid encounter within last 12 months    Recent Outpatient Visits           1 week ago Ingrowing left great toenail   [place] [facility] [place], Netta Neat, DO   5 months ago DDD (degenerative disc disease), cervical   [place] [facility] [place], Netta Neat, DO   8 months ago Acute pain of left shoulder   [place] [facility] [place], [place], [place]   10 months ago Acute non-recurrent frontal sinusitis   [place] [facility] [name], DO   11 months ago Annual physical exam   [place] [facility] [place], Netta Neat, DO       Future Appointments             In 3 weeks Althea Charon, Netta Neat, DO [facility], [facility]   In 4 weeks Charlsie Quest, NP [facility] [place]

## 2023-02-28 ENCOUNTER — Encounter: Payer: Self-pay | Admitting: Neurosurgery

## 2023-02-28 ENCOUNTER — Ambulatory Visit
Admission: RE | Admit: 2023-02-28 | Discharge: 2023-02-28 | Disposition: A | Discharge status: Home / Self Care | Payer: Managed Care, Other (non HMO) | Attending: Neurosurgery | Admitting: Neurosurgery

## 2023-02-28 ENCOUNTER — Ambulatory Visit: Payer: Managed Care, Other (non HMO) | Admitting: Neurosurgery

## 2023-02-28 ENCOUNTER — Other Ambulatory Visit: Payer: Self-pay

## 2023-02-28 ENCOUNTER — Ambulatory Visit
Admission: RE | Admit: 2023-02-28 | Discharge: 2023-02-28 | Disposition: A | Discharge status: Home / Self Care | Payer: Managed Care, Other (non HMO) | Source: Ambulatory Visit | Attending: Neurosurgery | Admitting: Neurosurgery

## 2023-02-28 VITALS — BP 136/70 | Ht 66.0 in | Wt 198.0 lb

## 2023-02-28 DIAGNOSIS — M5412 Radiculopathy, cervical region: Secondary | ICD-10-CM | POA: Diagnosis present

## 2023-02-28 DIAGNOSIS — Z981 Arthrodesis status: Secondary | ICD-10-CM | POA: Diagnosis not present

## 2023-02-28 NOTE — Progress Notes (Signed)
   REFERRING PHYSICIAN:  Kasandra Knudsen [address] [place],  [place] [address]  DOS: 09/05/22 ACDF C5-C7  HISTORY OF PRESENT ILLNESS: Talton Roedl is status post  ACDF C5-C7.  He is doing well with minimal pain.  He is having some soft tissue pain in his right shoulder blade.   PHYSICAL EXAMINATION:  NEUROLOGICAL:  General: In no acute distress.   Awake, alert, oriented to person, place, and time.  Pupils equal round and reactive to light.  Facial tone is symmetric.    Strength: Side Biceps Triceps Deltoid Interossei Grip Wrist Ext. Wrist Flex.  R 5 5 5 5 5 5 5   L 5 5 5 5 5 5 5    Incision c/d/I  Imaging:  X-rays on October 16, 2022 reviewed.  There is evidence of settling at C5-6.  At C6-7, there is evidence of subsidence with a new kyphosis between C6 and C7.  X-rays from November 27, 2022 and December 5 are stable. Assessment / Plan: Ksean Mcglaun is doing well s/p above surgery.  Clinically, he is doing quite well.  I remain somewhat concerned regarding the settling of the implants, but he is clinically stable.  His imaging is stable.  I will see him back in 6 months with x-rays.    I spent a total of 10 minutes in this patient's care today. This time was spent reviewing pertinent records including imaging studies, obtaining and confirming history, performing a directed evaluation, formulating and discussing my recommendations, and documenting the visit within the medical record.    Venetia Night Dept of Neurosurgery

## 2023-03-14 LAB — CBC WITH DIFFERENTIAL/PLATELET
Basophils Absolute: 0.1 10*3/uL (ref 0.0–0.2)
Basos: 1 %
EOS (ABSOLUTE): 0.3 10*3/uL (ref 0.0–0.4)
Eos: 4 %
Hematocrit: 40.7 % (ref 37.5–51.0)
Hemoglobin: 13.1 g/dL (ref 13.0–17.7)
Immature Grans (Abs): 0.2 10*3/uL — ABNORMAL HIGH (ref 0.0–0.1)
Immature Granulocytes: 2 %
Lymphocytes Absolute: 1.9 10*3/uL (ref 0.7–3.1)
Lymphs: 21 %
MCH: 32.1 pg (ref 26.6–33.0)
MCHC: 32.2 g/dL (ref 31.5–35.7)
MCV: 100 fL — ABNORMAL HIGH (ref 79–97)
Monocytes Absolute: 1.2 10*3/uL — ABNORMAL HIGH (ref 0.1–0.9)
Monocytes: 14 %
Neutrophils Absolute: 5.2 10*3/uL (ref 1.4–7.0)
Neutrophils: 58 %
Platelets: 171 10*3/uL (ref 150–450)
RBC: 4.08 x10E6/uL — ABNORMAL LOW (ref 4.14–5.80)
RDW: 13.2 % (ref 11.6–15.4)
WBC: 9.1 10*3/uL (ref 3.4–10.8)

## 2023-03-14 LAB — LIPID PANEL
Chol/HDL Ratio: 2.3 {ratio} (ref 0.0–5.0)
Cholesterol, Total: 125 mg/dL (ref 100–199)
HDL: 55 mg/dL (ref >39)
LDL Chol Calc (NIH): 46 mg/dL (ref 0–99)
Triglycerides: 141 mg/dL (ref 0–149)
VLDL Cholesterol Cal: 24 mg/dL (ref 5–40)

## 2023-03-14 LAB — COMPREHENSIVE METABOLIC PANEL
ALT: 14 [IU]/L (ref 0–44)
AST: 18 [IU]/L (ref 0–40)
Albumin: 4.2 g/dL (ref 3.8–4.8)
Alkaline Phosphatase: 83 [IU]/L (ref 44–121)
BUN/Creatinine Ratio: 20 (ref 10–24)
BUN: 26 mg/dL (ref 8–27)
Bilirubin Total: 0.6 mg/dL (ref 0.0–1.2)
CO2: 22 mmol/L (ref 20–29)
Calcium: 10.3 mg/dL — ABNORMAL HIGH (ref 8.6–10.2)
Chloride: 108 mmol/L — ABNORMAL HIGH (ref 96–106)
Creatinine, Ser: 1.31 mg/dL — ABNORMAL HIGH (ref 0.76–1.27)
Globulin, Total: 2 g/dL (ref 1.5–4.5)
Glucose: 88 mg/dL (ref 70–99)
Potassium: 4.9 mmol/L (ref 3.5–5.2)
Sodium: 143 mmol/L (ref 134–144)
Total Protein: 6.2 g/dL (ref 6.0–8.5)
eGFR: 58 mL/min/{1.73_m2} — ABNORMAL LOW (ref >59)

## 2023-03-14 LAB — HEMOGLOBIN A1C
Est. average glucose Bld gHb Est-mCnc: 131 mg/dL
Hgb A1c MFr Bld: 6.2 % — ABNORMAL HIGH (ref 4.8–5.6)

## 2023-03-14 LAB — T4, FREE: Free T4: 1.12 ng/dL (ref 0.82–1.77)

## 2023-03-14 LAB — TSH: TSH: 2.43 u[IU]/mL (ref 0.450–4.500)

## 2023-03-14 LAB — PSA: Prostate Specific Ag, Serum: 0.4 ng/mL (ref 0.0–4.0)

## 2023-03-14 LAB — VITAMIN D 25 HYDROXY (VIT D DEFICIENCY, FRACTURES): Vit D, 25-Hydroxy: 30.3 ng/mL (ref 30.0–100.0)

## 2023-03-14 LAB — VITAMIN B12: Vitamin B-12: 379 pg/mL (ref 232–1245)

## 2023-03-18 ENCOUNTER — Other Ambulatory Visit: Payer: Managed Care, Other (non HMO)

## 2023-03-21 ENCOUNTER — Other Ambulatory Visit: Payer: Managed Care, Other (non HMO)

## 2023-03-21 ENCOUNTER — Ambulatory Visit: Payer: Managed Care, Other (non HMO) | Attending: Cardiology

## 2023-03-21 DIAGNOSIS — R0602 Shortness of breath: Secondary | ICD-10-CM

## 2023-03-22 LAB — ECHOCARDIOGRAM COMPLETE
AR max vel: 2.27 cm2
AV Area VTI: 2.15 cm2
AV Area mean vel: 2.04 cm2
AV Mean grad: 7 mm[Hg]
AV Peak grad: 13.4 mm[Hg]
Ao pk vel: 1.83 m/s
Area-P 1/2: 3.31 cm2
S' Lateral: 2 cm

## 2023-03-22 NOTE — Progress Notes (Signed)
Heart squeeze 60-65% with normal function, no wall motion abnormalities are noted, there is some muscle stiffness likely related to age and/or blood pressure, there was also mild leakage noted in the mitral valve. No findings to explain symptoms.

## 2023-03-25 ENCOUNTER — Ambulatory Visit (INDEPENDENT_AMBULATORY_CARE_PROVIDER_SITE_OTHER): Payer: Managed Care, Other (non HMO) | Admitting: Family Medicine

## 2023-03-25 ENCOUNTER — Encounter: Payer: Self-pay | Admitting: Family Medicine

## 2023-03-25 VITALS — BP 136/70 | HR 75 | Ht 66.0 in | Wt 202.0 lb

## 2023-03-25 DIAGNOSIS — Z0000 Encounter for general adult medical examination without abnormal findings: Secondary | ICD-10-CM | POA: Diagnosis not present

## 2023-03-25 DIAGNOSIS — Z23 Encounter for immunization: Secondary | ICD-10-CM

## 2023-03-25 NOTE — Patient Instructions (Addendum)
Thank you for coming to the office today.  Prevnar-20 vaccine recommended.  Please let me know before ready for labs in 6 months. [facility] orders  Recent Labs    03/13/23 0825  HGBA1C 6.2*   Let me know if need any refills on medications  Recommend Vitamin D 1000-2000 unit supplement.  Thyroid and Cholesterol controlled  Please schedule a Follow-up Appointment to: Return in about 6 months (around 09/23/2023) for 6 month PreDM A1c.  If you have any other questions or concerns, please feel free to call the office or send a message through MyChart. You may also schedule an earlier appointment if necessary.  Additionally, you may be receiving a survey about your experience at our office within a few days to 1 week by e-mail or mail. We value your feedback.  Adrian Pilar, DO [facility], [place]

## 2023-03-25 NOTE — Progress Notes (Signed)
Subjective:    Patient ID: Adrian Zavala, male    DOB: [birth year], 72 y.o.   MRN: [id number]  Adrian Zavala is a 72 y.o. male presenting on 03/25/2023 for Annual Exam   HPI  Discussed the use of AI scribe software for clinical note transcription with the patient, who gave verbal consent to proceed.  History of Present Illness    He retires at end of 03/25/24  The patient attributed the shortness of breath to weight gain, which he believes is due to a sedentary lifestyle following hip and neck issues.  The patient has been managing his weight and diet, but noted an increase in his A1c levels to 6.2, up from 6.0 over the past two years. He attributed this increase to periods of inactivity due to hip and neck problems, during which he gained weight. The patient expressed a desire to improve his lifestyle and diet to manage his prediabetes.  The patient also reported a recent decrease in the effectiveness of injections for back pain, which had previously been successful. He expressed dissatisfaction with his current physiatrist and is considering switching to a new provider w/ [facility] instead of [facility].  The patient is also due for several vaccinations, including the Shingrix shingles vaccine and the Prevnar 20 pneumonia vaccine, which he plans to receive at a pharmacy due to insurance coverage. He also mentioned an upcoming transition to Medicare, which he believes will cover the cost of these vaccines.  The patient's lab results showed a slight decrease in vitamin B12 levels and a slight increase in creatinine levels, indicating mild chronic kidney dysfunction. His cholesterol levels were within normal range, which he attributed to his medication regimen of rosuvastatin and Zetia. The patient's liver enzymes were also within normal range.  Cervical Spine DDD Followed by [facility] Dr Myer Haff S/p C5-7 anterior cervical discectomy and fusion on 09/05/22 Controlled mostly on  Tramadol AS NEEDED   History R Hip S/p fracture repair, and periprosthetic fracture   Heart Murmur Previous history of heart murmur, has followed Cardiology  Bruit R carotid   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, hands, hips) Dr Landry Mellow [facility] He had x-rays C/T spine, showed OA/DJD   He continues to take Tramadol up to 3 times daily regularly PRN for pain with good results, needs refill - He has chronic back pain with DJD bone spurs and has curvature of spine, worse with prolonged sitting at work, causing worse back pain, followed by Rheumatology - Taking Tylenol Arthritis 650mg  x 1 per dose in afternoon and evening with some relief - On Tizanidine half of 4mg  TID PRN   - Reports chronic problem with chronic joint pain and stiffness from arthritis. 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, has had some hand and finger joint deformity due to arthritis    Hyperlipidemia CAD HTN Ascending aortic aneurysm - Followed by Cardiology - He is on Zetia and Crestor They are monitoring CAD, he has had a CT Coronary scan done 07/2020  On Amlodipine for BP    Health Maintenance:  Future Shingrix  Last Colonoscopy 10/25/14 - negative, repeat 2026.  Prevnar-20 next year 2025.      03/25/2023    3:33 PM 02/19/2023    4:33 PM 09/18/2022   11:33 AM  Depression screen PHQ 2/9  Decreased Interest 0 0 0  Down, Depressed, Hopeless 0 0 0  PHQ - 2 Score 0 0 0  Altered sleeping  0  Tired, decreased energy   0  Change in appetite   0  Feeling bad or failure about yourself    0  Trouble concentrating   0  Moving slowly or fidgety/restless   0  Suicidal thoughts   0  PHQ-9 Score   0  Difficult doing work/chores   Not difficult at all       03/25/2023    3:33 PM 02/19/2023    4:33 PM 09/18/2022   11:33 AM 03/09/2022    4:02 PM  GAD 7 : Generalized Anxiety Score  Nervous, Anxious, on Edge 0 0 0 0  Control/stop worrying  0 0 0 0  Worry too much - different things 0  0 0  Trouble relaxing 0 0 0 0  Restless 0 0 0 0  Easily annoyed or irritable 0 0 0 0  Afraid - awful might happen 0 0 0 0  Total GAD 7 Score 0  0 0  Anxiety Difficulty  Not difficult at all Not difficult at all Not difficult at all     Past Medical History:  Diagnosis Date   Anemia    Aortic atherosclerosis (HCC)    Aortic stenosis 06/10/2017   a.) TTE 06/10/2017: EF 60-65%; very mild AS with MPG 5 mmHG; AVA (VTI) = 2.22 cm   Arthritis    Spine, Left Hip, Hands   Ascending aorta dilatation (HCC) 05/07/2016   a.) cCTA 05/07/2016: measured 41 mm; b.) TTE 06/10/2017: measured 39 mm; c.) cCTA 08/04/2020: measured 39 mm; d.) TTE 08/15/2020: measured 38 mm   BPH (benign prostatic hyperplasia)    Cervical radiculopathy    Coronary artery disease    Crohn's disease (HCC)    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HCC)    a.) exogenous 2/2 prolonged corticosteroid use   Diastolic dysfunction 08/15/2020   a.) TTE 08/15/2020: EF 60-65%, mild MR, mod AoV sclerosis, G1DD   Fatty liver disease, nonalcoholic    Gangrene of gallbladder    GERD (gastroesophageal reflux disease)    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HCC)    Heart murmur    Hemorrhoid    Hypercholesteremia    Hypertension    Hypothyroidism    IBS (irritable bowel syndrome)    Inflammatory eye disease    Long term current use of systemic steroids    a.) low dose prednisone   Long-term current use of bisphosphonate    a.) ibandronate   Lower extremity weakness    a.) s/p chemo for Wegener's   Lumbar stenosis    Lung disorder    S/P chemo for Wegener's   Osteopenia    Osteoporosis    Pre-diabetes    RBBB (right bundle branch block)    Seasonal allergies    Spinal stenosis in cervical region    Spondylolisthesis of cervical region    Wegener's granulomatosis    a.) s/p Tx with cyclophosphamide + MTX; ongoing treatment with chronic corticosteroids   Past Surgical History:  Procedure  Laterality Date   ANTERIOR CERVICAL DECOMP/DISCECTOMY FUSION N/A 09/05/2022   Procedure: C5-7 ANTERIOR CERVICAL DISCECTOMY AND FUSION (HEDRON);  Surgeon: Venetia Night, MD;  Location: [facility];  Service: Neurosurgery;  Laterality: N/A;   CATARACT EXTRACTION W/ INTRAOCULAR LENS  IMPLANT, BILATERAL Bilateral    CHOLECYSTECTOMY     with exploration; has "metal sutures"   COLONOSCOPY N/A 10/25/2014   Procedure: COLONOSCOPY;  Surgeon: Midge Minium, MD;  Location: [facility];  Service: Gastroenterology;  Laterality: N/A;   FEMORAL REVISION Right  10/25/2021   Procedure: REVISION FEMORAL STEM RIGHT HIP;  Surgeon: Christena Flake, MD;  Location: [facility];  Service: Orthopedics;  Laterality: Right;   LASER RESURFACING Bilateral    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   Procedure: TOTAL HIP ARTHROPLASTY;  Surgeon: Christena Flake, MD;  Location: [facility];  Service: Orthopedics;  Laterality: Right;   TREATMENT FISTULA ANAL     closure with rectal advancement flap   VASECTOMY     Social History   Socioeconomic History   Marital status: Married    Spouse name: Kairan Khim   Number of children: 2   Years of education: Not on file   Highest education level: Not on file  Occupational History   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[employer], Educational psychologist)  Tobacco Use   Smoking status: Former    Current packs/day: 0.00    Average packs/day: 1 pack/day for 30.0 years (30.0 ttl pk-yrs)    Types: Cigarettes    Start date: 08/25/1966    Quit date: 08/24/1996    Years since quitting: 26.6   Smokeless tobacco: Never  Vaping Use   Vaping status: Never Used  Substance and Sexual Activity   Alcohol use: Yes    Alcohol/week: 3.0 standard drinks of alcohol    Types: 3 Cans of beer per week    Comment: occassional   Drug use: No   Sexual activity: Not on file  Other Topics Concern   Not on file  Social History Narrative   Not on file   Social Drivers of Health   Financial Resource Strain: Low Risk   (03/08/2023)   Received from [facility]   Overall Financial Resource Strain (CARDIA)    Difficulty of Paying Living Expenses: Not hard at all  Food Insecurity: No Food Insecurity (03/08/2023)   Received from [facility]   Hunger Vital Sign    Worried About Running Out of Food in the Last Year: Never true    Ran Out of Food in the Last Year: Never true  Transportation Needs: No Transportation Needs (03/08/2023)   Received from [facility] - Transportation    In the past 12 months, has lack of transportation kept you from medical appointments or from getting medications?: No    Lack of Transportation (Non-Medical): No  Physical Activity: Patient Declined (02/19/2023)   Exercise Vital Sign    Days of Exercise per Week: Patient declined    Minutes of Exercise per Session: Patient declined  Stress: Patient Declined (02/19/2023)   [employer] of Occupational Health - Occupational Stress Questionnaire    Feeling of Stress : Patient declined  Social Connections: Patient Declined (02/19/2023)   Social Connection and Isolation Panel [NHANES]    Frequency of Communication with Friends and Family: Patient declined    Frequency of Social Gatherings with Friends and Family: Patient declined    Attends Religious Services: Patient declined    Database administrator or Organizations: Patient declined    Attends Banker Meetings: Patient declined    Marital Status: Patient declined  Intimate Partner Violence: Not At Risk (09/05/2022)   Humiliation, Afraid, Rape, and Kick questionnaire    Fear of Current or Ex-Partner: No    Emotionally Abused: No    Physically Abused: No    Sexually Abused: No   Family History  Family history unknown: Yes   Current Outpatient Medications on File Prior to Visit  Medication Sig   acetaminophen (TYLENOL)  650 MG CR tablet Take 650 mg by mouth every 8 (eight) hours as needed for  pain.   calcium carbonate (OS-CAL) 600 MG TABS tablet Take 600 mg by mouth daily. AM   Cholecalciferol (VITAMIN D3) 25 MCG (1000 UT) CAPS Take 1 capsule by mouth daily.   ezetimibe (ZETIA) 10 MG tablet Take 1 tablet (10 mg total) by mouth daily.   famotidine (PEPCID) 40 MG tablet TAKE 1 TABLET BY MOUTH DAILY   FIBER PO Take 1 capsule by mouth 2 (two) times daily.   fluticasone (FLONASE) 50 MCG/ACT nasal spray Place 2 sprays into both nostrils daily. Use for 4-6 weeks then stop and use seasonally or as needed.   gabapentin (NEURONTIN) 300 MG capsule Take 1 capsule (300 mg total) by mouth 3 (three) times daily.   ibandronate (BONIVA) 150 MG tablet Take 150 mg by mouth every 30 (thirty) days.   levothyroxine (SYNTHROID) 25 MCG tablet TAKE 1 TABLET BY MOUTH DAILY   loratadine (CLARITIN) 10 MG tablet Take 10 mg by mouth every morning.   Magnesium 200 MG TABS Take 200 mg by mouth daily.   methocarbamol (ROBAXIN) 500 MG tablet Take 1 tablet (500 mg total) by mouth every 6 (six) hours as needed for muscle spasms.   niacin (NIASPAN) 500 MG CR tablet Take 500 mg by mouth at bedtime.   predniSONE (DELTASONE) 5 MG tablet Take 5 mg by mouth daily.   rosuvastatin (CRESTOR) 40 MG tablet TAKE 1 TABLET BY MOUTH DAILY   traMADol (ULTRAM) 50 MG tablet Take 1 tablet (50 mg total) by mouth every 6 (six) hours as needed for moderate pain (pain score 4-6).   valsartan (DIOVAN) 320 MG tablet Take 0.5 tablets (160 mg total) by mouth daily.   No current facility-administered medications on file prior to visit.    Review of Systems  Constitutional:  Negative for activity change, appetite change, chills, diaphoresis, fatigue and fever.  HENT:  Negative for congestion and hearing loss.   Eyes:  Negative for visual disturbance.  Respiratory:  Negative for cough, chest tightness, shortness of breath and wheezing.   Cardiovascular:  Negative for chest pain, palpitations and leg swelling.  Gastrointestinal:  Negative for  abdominal pain, constipation, diarrhea, nausea and vomiting.  Genitourinary:  Negative for dysuria, frequency and hematuria.  Musculoskeletal:  Negative for arthralgias and neck pain.  Skin:  Negative for rash.  Neurological:  Negative for dizziness, weakness, light-headedness, numbness and headaches.  Hematological:  Negative for adenopathy.  Psychiatric/Behavioral:  Negative for behavioral problems, dysphoric mood and sleep disturbance.    Per HPI unless specifically indicated above     Objective:    BP 136/70   Pulse 75   Ht 5\' 6"  (1.676 m)   Wt 202 lb (91.6 kg)   SpO2 98%   BMI 32.60 kg/m   Wt Readings from Last 3 Encounters:  03/25/23 202 lb (91.6 kg)  02/28/23 198 lb (89.8 kg)  02/26/23 198 lb 3.2 oz (89.9 kg)    Physical Exam Vitals and nursing note reviewed.  Constitutional:      General: He is not in acute distress.    Appearance: He is well-developed. He is not diaphoretic.     Comments: Well-appearing, comfortable, cooperative  HENT:     Head: Normocephalic and atraumatic.  Eyes:     General:        Right eye: No discharge.        Left eye: No discharge.  Conjunctiva/sclera: Conjunctivae normal.     Pupils: Pupils are equal, round, and reactive to light.  Neck:     Thyroid: No thyromegaly.     Vascular: No carotid bruit.  Cardiovascular:     Rate and Rhythm: Normal rate and regular rhythm.     Pulses: Normal pulses.     Heart sounds: Normal heart sounds. No murmur heard. Pulmonary:     Effort: Pulmonary effort is normal. No respiratory distress.     Breath sounds: Normal breath sounds. No wheezing or rales.  Abdominal:     General: Bowel sounds are normal. There is no distension.     Palpations: Abdomen is soft. There is no mass.     Tenderness: There is no abdominal tenderness.  Musculoskeletal:        General: No tenderness. Normal range of motion.     Cervical back: Normal range of motion and neck supple.     Right lower leg: No edema.      Left lower leg: No edema.     Comments: Upper / Lower Extremities: - Normal muscle tone, strength bilateral upper extremities 5/5, lower extremities 5/5  Lymphadenopathy:     Cervical: No cervical adenopathy.  Skin:    General: Skin is warm and dry.     Findings: No erythema or rash.  Neurological:     Mental Status: He is alert and oriented to person, place, and time.     Comments: Distal sensation intact to light touch all extremities  Psychiatric:        Mood and Affect: Mood normal.        Behavior: Behavior normal.        Thought Content: Thought content normal.     Comments: Well groomed, good eye contact, normal speech and thoughts     Results for orders placed or performed in visit on 03/21/23  ECHOCARDIOGRAM COMPLETE   Collection Time: 03/21/23  7:59 AM  Result Value Ref Range   AR max vel 2.27 cm2   AV Peak grad 13.4 mmHg   Ao pk vel 1.83 m/s   S' Lateral 2.00 cm   Area-P 1/2 3.31 cm2   AV Area VTI 2.15 cm2   AV Mean grad 7.0 mmHg   AV Area mean vel 2.04 cm2   Est EF 60 - 65%       Assessment & Plan:   Problem List Items Addressed This Visit   None Visit Diagnoses       Annual physical exam    -  Primary     Flu vaccine need       Relevant Orders   Flu Vaccine Trivalent High Dose (Fluad) (Completed)        Updated Health Maintenance information Reviewed recent lab results with patient Encouraged improvement to lifestyle with diet and exercise Goal of weight loss   Prediabetes A1C increased to 6.2 from 6.0 over the past two years. Discussed the impact of sedentary lifestyle and weight gain on blood glucose levels. -Encouraged lifestyle modifications including increased physical activity and improved diet. -Plan to recheck A1C in 6 months.  Hyperlipidemia Followed by Cardiology Well controlled on Rosuvastatin 40mg  and Zetia 10mg . -Continue current medication regimen.  Chronic Kidney Disease IIIa Slight improvement in kidney function with  current creatinine at 1.31 and GFR at 58. -Continue monitoring kidney function.  Shortness of Breath Recent echocardiogram showed mild leakage and stiffness likely related to age and blood pressures, but not enough to explain symptoms.  No abnormal lung sounds on examination. -Continue monitoring symptoms and follow up with cardiologist as needed. Goal to focus on improving exercise tolerance and weight loss Reconsider Pulmonology or other imaging / work up if indicated.  General Health Maintenance -Colonoscopy due in 2026. -Eligible for Prevnar 20 pneumonia vaccine in 2025. -Eligible for coronary artery calcium score CT scan if desired. -Continue multivitamin and consider daily Vitamin D supplement.      Orders Placed This Encounter  Procedures   Flu Vaccine Trivalent High Dose (Fluad)    No orders of the defined types were placed in this encounter.    Follow up plan: Return in about 6 months (around 09/23/2023) for 6 month PreDM A1c.  He will notify our office within 5-6 months and we can order [facility] orders before. Plan for CMET CBC Lipid A1c TSH T4 B12 Vit D other orders if need.   Saralyn Pilar, DO [facility] 03/25/2023, 3:47 PM

## 2023-03-28 ENCOUNTER — Ambulatory Visit: Payer: Medicare Other | Attending: Cardiology | Admitting: Cardiology

## 2023-03-28 ENCOUNTER — Encounter: Payer: Self-pay | Admitting: Cardiology

## 2023-03-28 VITALS — BP 113/70 | HR 97 | Ht 66.0 in | Wt 200.2 lb

## 2023-03-28 DIAGNOSIS — E785 Hyperlipidemia, unspecified: Secondary | ICD-10-CM | POA: Diagnosis present

## 2023-03-28 DIAGNOSIS — I77810 Thoracic aortic ectasia: Secondary | ICD-10-CM | POA: Insufficient documentation

## 2023-03-28 DIAGNOSIS — I25118 Atherosclerotic heart disease of native coronary artery with other forms of angina pectoris: Secondary | ICD-10-CM | POA: Insufficient documentation

## 2023-03-28 DIAGNOSIS — I10 Essential (primary) hypertension: Secondary | ICD-10-CM | POA: Insufficient documentation

## 2023-03-28 DIAGNOSIS — R0602 Shortness of breath: Secondary | ICD-10-CM | POA: Insufficient documentation

## 2023-03-28 DIAGNOSIS — R011 Cardiac murmur, unspecified: Secondary | ICD-10-CM | POA: Insufficient documentation

## 2023-03-28 NOTE — Progress Notes (Signed)
 Cardiology Office Note:  .   Date:  03/28/2023  ID:  Adrian Zavala, DOB 11-Aug-1950, MRN [id number] PCP: Edman Marsa PARAS, DO  [facility] Providers Cardiologist:  Evalene Lunger, MD    History of Present Illness: .   Adrian Zavala is a 73 y.o. 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   Previously seen in April 2022.  He reported developing musculoskeletal pain in his left ribs and chest left arm.  Left arm pain which has been further for pain that started at the top of his arm.  In addition he had jaw/neck pain and pain to the left temporal.  He reported that his pain was in his muscles and right rib cage for 30+ years that started this Wegener's (1991).  coronary CTA without acute intrathoracic abnormality and showing similar appearance of granulomatous disease.  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  Recommendation was for imaging follow-up by CTA or MRI on an annual basis.  Coronary artery calcium  score was 975, placing him in the 87th percentile for age and sex matched control.  A calcified plaque causing moderate stenosis was noted in the RCA and proximal LAD.  CAD RADS 3.  Moderate stenosis.  Recommendation was for symptom guided anti-ischemic pharmacotherapy, as well as risk factor modification per guideline directed care.  Additional analysis with CT FFR was submitted and reported separately, given the stenosis noted as above.  On coronary CTA, aorta was noted to be 39 mm.  Aortic valve noted to be trileaflet/tricuspid.  Subsequent CT FFR showed no significant stenosis of the LAD with FFR 0.8.  Left circumflex FFR 0.9.  RCA FFR  analysis was not able to be performed due to significant motion artifact. Echo showed EF 60 to 65%, NR WMA, G1 DD, mild MR, moderate aortic sclerosis without evidence of stenosis, and aortic dilation 38 mm.  ACDF of C5-C6-C7 and 09/05/2022.  He had plates inserted.  He was wearing the brace because there was concern for settling of the implants.    He was last seen in clinic 02/26/2023.  He was experiencing progressive shortness of breath with exertion.  And also been cutting his valsartan  off because he was having extreme bouts of dizziness and several episodes where he almost fallen.  He was scheduled for an updated echocardiogram.  He was also advised to continue taking half of his valsartan  and to his blood pressure 1 to 2 hours postmedication administration.  He returns to clinic today accompanied by his wife.  Overall he states that he has been doing fairly well.  He denies any chest pain, palpitations, peripheral edema, lightheadedness or dizziness.  Continues to have some occasional shortness of breath.  States that he has been compliant with his current medications without any adverse effects.  Recently had labs done with his PCP.  Denies any hospitalizations or visits to the emergency department.  ROS: 10 point review of system has been reviewed and considered negative except what is been listed in the HPI  Studies Reviewed: .       2D echo 03/21/23 1. Left ventricular ejection fraction, by estimation, is 60 to 65%. The  left ventricle has normal function. The  left ventricle has no regional  wall motion abnormalities. Left ventricular diastolic parameters are  consistent with Grade I diastolic  dysfunction (impaired relaxation). The average left ventricular global  longitudinal strain is -25.3 %.   2. Right ventricular systolic function is normal. The right ventricular  size is normal. Tricuspid regurgitation signal is inadequate for assessing  PA pressure.   3. The mitral valve is normal  in structure. Mild mitral valve  regurgitation. No evidence of mitral stenosis.   4. The aortic valve is normal in structure. There is mild calcification  of the aortic valve. Aortic valve regurgitation is not visualized. Aortic  valve sclerosis/calcification is present, without any evidence of aortic  stenosis. Aortic valve mean  gradient measures 7.0 mmHg.   5. The inferior vena cava is normal in size with greater than 50%  respiratory variability, suggesting right atrial pressure of 3 mmHg.   cCTA 07/2020 IMPRESSION: 1. Coronary calcium  score of 975. This was 87th percentile for age and sex matched control. 2. Normal coronary origin with right dominance. 3. Calcified plaque causing moderate stenosis in the RCA and proximal LAD. 4. CAD-RADS 3. Moderate stenosis. Consider symptom-guided anti-ischemic pharmacotherapy as well as risk factor modification per guideline directed care. Additional analysis with CT FFR will be submitted and reported separately. 5. Mild ascending aorta dilation, maximum diameter 39mm. 6. Aortic valve is Trileaflet/tricuspid. 1. Left Main:  No significant stenosis. 2. LAD: No significant stenosis.  FFRct 0.8 3. LCX: No significant stenosis.  FFRct 0.9 4. RCA: FFRct analysis not performed due to significant motion artifact.   IMPRESSION: 1.  CT FFR analysis didn't show any significant stenosis.   2. FFRct analysis not performed in the RCA due to significant motion artifact.   Echo 08/15/20  1. Left ventricular ejection fraction, by estimation, is 60 to 65%. The  left ventricle has normal function. The left ventricle has no regional  wall motion abnormalities. Left ventricular diastolic parameters are  consistent with Grade I diastolic  dysfunction (impaired relaxation).   2. Right ventricular systolic function is normal. The right ventricular  size is normal. Tricuspid regurgitation signal is inadequate for assessing  PA pressure.   3. The mitral  valve is normal in structure. Mild mitral valve  regurgitation.   4. The aortic valve is normal in structure. Moderate aortic valve  sclerosis/calcification is present, without any evidence of aortic  stenosis.   5. There is borderline dilatation of the ascending aorta, measuring 38  mm.    Echo 06/10/2017 - Left ventricle: The cavity size was normal. Wall thickness was    normal. Systolic function was normal. The estimated ejection    fraction was in the range of 60% to 65%. Wall motion was normal;    there were no regional wall motion abnormalities. Left    ventricular diastolic function parameters were normal.  - Aortic valve: Possibly bicuspid; normal thickness, mildly    calcified leaflets. There was very mild stenosis. Mean gradient    (S): 5 mm Hg. Valve area (VTI): 2.22 cm^2.  - Aorta: Ascending aortic diameter: 39 mm (S).   MPI 05/14/2016 Blood pressure demonstrated a normal response to exercise. Horizontal ST segment depression ST segment depression of 1 mm was noted during stress in the V4, V5, V6 and V3 leads. These changes are suggestive of ischemia but there was motion artifact. No T wave inversion was noted during stress. The study is normal. This is a low risk study. The left ventricular ejection fraction  is normal (55-65%).    Coronary artery calcium  score 04/2016 IMPRESSION: 1. Coronary calcium  score of 497. This was 43 percentile for age and sex matched control. This is a high score and further functional study with a stress test is recommended. 2. Calcified aortic valve. 3. Aneurysmal dilatation of the ascending aorta in the axial images measuring 41 mm. Further evaluation with CTA/MRA is recommended. Risk Assessment/Calculations:             Physical Exam:   VS:  BP 113/70   Pulse 97   Ht 5' 6 (1.676 m)   Wt 200 lb 3.2 oz (90.8 kg)   SpO2 96%   BMI 32.31 kg/m    Wt Readings from Last 3 Encounters:  03/28/23 200 lb 3.2 oz (90.8 kg)  03/25/23 202  lb (91.6 kg)  02/28/23 198 lb (89.8 kg)    GEN: Well nourished, well developed in no acute distress NECK: No JVD; No carotid bruits CARDIAC: RRR, II/VI systolic murmur RUSB without rubs or gallops RESPIRATORY:  Clear to auscultation without rales, wheezing or rhonchi  ABDOMEN: Soft, non-tender, non-distended EXTREMITIES:  No edema; No deformity   ASSESSMENT AND PLAN: .   Dyspnea on exertion that is unchanged since he was last seen earlier in the month.  Continues to decrease his weight without trying.  Is euvolemic on exam.  No adventitious lung sounds have been noted.  Echocardiogram recently completed revealed an LVEF of 60 to 65% with normal function, no wall motion abnormalities, G1 DD, and mild MR.  If continues to have progressive shortness of breath can consider following up with pulmonary for PFTs.  Likely a portion of deconditioning as well  Coronary disease with stable angina, significant coronary calcification with a score 975, coronary CTA.  Nonobstructive disease.  Moderate stenosis of RCA and LAD.  Continues to deny symptoms of angina.  He is continued on ezetimibe  10 mg daily and rosuvastatin  40 mg daily.  Will benefit from aspirin 81 mg daily as well.  If continued worsening dyspnea on return can consider repeat coronary CTA for anginal equivalent of worsening shortness of breath.  Primary hypertension with blood pressure 113/70 today.  Blood pressures have been well-controlled.  He continues to take 160 mg of valsartan  because full tablet dropped his blood pressure too low and cause dizziness.  He has been encouraged to continue to monitor his pressure 1 to 2 hours postmedication administration as well.  Longstanding heart murmur with previous suspicion of bicuspid aortic valve.  Not noted on recent echocardiogram.  Sclerosis noted without stenosis.  Mixed hyperlipidemia with cholesterols levels remain at goal.  He is continued on ezetimibe  and rosuvastatin .  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  Echocardiogram 3.9 cm in 2019 and reevaluated in 2022 with no change.  Not noted on echocardiogram       Dispo: Patient return to clinic to see MD/APP in 3 to 4 months or sooner if needed for reevaluation of symptoms.  Signed, Oluwatobiloba Martin, NP

## 2023-03-28 NOTE — Patient Instructions (Signed)
 Medication Instructions:  - No changes *If you need a refill on your cardiac medications before your next appointment, please call your pharmacy*  Lab Work: - None ordered  Testing/Procedures: - None ordered  Follow-Up: At [facility], you and your health needs are our priority.  As part of our continuing mission to provide you with exceptional heart care, we have created designated Provider Care Teams.  These Care Teams include your primary Cardiologist (physician) and Advanced Practice Providers (APPs -  Physician Assistants and Nurse Practitioners) who all work together to provide you with the care you need, when you need it.  Your next appointment:   3 - 4 month(s)  Provider:   Timothy Gollan, MD

## 2023-04-25 ENCOUNTER — Other Ambulatory Visit: Payer: Self-pay | Admitting: Family Medicine

## 2023-04-25 DIAGNOSIS — K219 Gastro-esophageal reflux disease without esophagitis: Secondary | ICD-10-CM

## 2023-04-25 DIAGNOSIS — E039 Hypothyroidism, unspecified: Secondary | ICD-10-CM

## 2023-04-25 DIAGNOSIS — M150 Primary generalized (osteo)arthritis: Secondary | ICD-10-CM

## 2023-04-25 DIAGNOSIS — G509 Disorder of trigeminal nerve, unspecified: Secondary | ICD-10-CM

## 2023-04-25 NOTE — Telephone Encounter (Signed)
Medication Refill -  Most Recent Primary Care Visit:  Provider: [name]  Department: [facility]  Visit Type: PHYSICAL  Date: 03/25/2023  Medication: famotidine (PEPCID) 40 MG tablet [[id number]] gabapentin (NEURONTIN) 300 MG capsule [[id number]] levothyroxine (SYNTHROID) 25 MCG tablet [[id number]]   Has the patient contacted their pharmacy? Yes  (Agent: If yes, when and what did the pharmacy advise?) Contact PCP   Is this the correct pharmacy for this prescription? Yes  This is the patient's preferred pharmacy:    [facility] [id number] [place], [place] - [address] [place] [address] Phone: [phone] Fax: [phone]   Has the prescription been filled recently? Yes  Is the patient out of the medication? No  Has the patient been seen for an appointment in the last year OR does the patient have an upcoming appointment? Yes  Can we respond through MyChart? Yes  Agent: Please be advised that Rx refills may take up to 3 business days. We ask that you follow-up with your pharmacy.

## 2023-04-26 ENCOUNTER — Telehealth: Payer: Self-pay | Admitting: Cardiovascular Disease

## 2023-04-26 DIAGNOSIS — M5030 Other cervical disc degeneration, unspecified cervical region: Secondary | ICD-10-CM

## 2023-04-26 DIAGNOSIS — M150 Primary generalized (osteo)arthritis: Secondary | ICD-10-CM

## 2023-04-26 MED ORDER — EZETIMIBE 10 MG PO TABS: 10.0000 mg | ORAL_TABLET | Freq: Every day | ORAL | 3 refills | Status: AC

## 2023-04-26 MED ORDER — ROSUVASTATIN CALCIUM 40 MG PO TABS: 40.0000 mg | ORAL_TABLET | Freq: Every day | ORAL | 3 refills | Status: AC

## 2023-04-26 MED ORDER — FAMOTIDINE 40 MG PO TABS: 40.0000 mg | ORAL_TABLET | Freq: Every day | ORAL | 1 refills | Status: DC

## 2023-04-26 MED ORDER — VALSARTAN 320 MG PO TABS: 160.0000 mg | ORAL_TABLET | Freq: Every day | ORAL | 3 refills | Status: DC

## 2023-04-26 MED ORDER — LEVOTHYROXINE SODIUM 25 MCG PO TABS: 25.0000 ug | ORAL_TABLET | Freq: Every day | ORAL | 1 refills | Status: DC

## 2023-04-26 MED ORDER — GABAPENTIN 300 MG PO CAPS: 300.0000 mg | ORAL_CAPSULE | Freq: Three times a day (TID) | ORAL | 1 refills | Status: AC

## 2023-04-26 NOTE — Addendum Note (Signed)
Addended by: Jani Gravel on: 04/26/2023 02:26 PM   Modules accepted: Orders

## 2023-04-26 NOTE — Telephone Encounter (Signed)
Adrian Zavala called to advise he has now retired and will be transferring his prescriptions from [facility] to the [facility] in [place].  Their phone number is [phone]. He may be calling to have them transferred.

## 2023-04-26 NOTE — Telephone Encounter (Signed)
Requested Prescriptions  Pending Prescriptions Disp Refills   famotidine (PEPCID) 40 MG tablet 90 tablet 1    Sig: Take 1 tablet (40 mg total) by mouth daily.     Gastroenterology:  H2 Antagonists Passed - 04/26/2023  1:05 PM      Passed - Valid encounter within last 12 months    Recent Outpatient Visits           1 month ago Annual physical exam   [place] [facility], Netta Neat, DO   2 months ago [place] left great toenail   [place]  [facility] [name], DO   7 months ago DDD (degenerative disc disease), cervical   [place] [facility] [name], DO   10 months ago Acute pain of left shoulder   [place] [facility] [place], Oswaldo [place], [place]   1 year ago Acute non-recurrent frontal sinusitis   [place] [facility] [name], DO       Future Appointments             In 2 months Gollan, Tollie Pizza, MD [facility] [place]   In 5 months Althea Charon, Netta Neat, DO [facility]             gabapentin (NEURONTIN) 300 MG capsule 270 capsule 1    Sig: Take 1 capsule (300 mg total) by mouth 3 (three) times daily.     Neurology: Anticonvulsants - gabapentin Failed - 04/26/2023  1:05 PM      Failed - Cr in normal range and within 360 days    Creatinine, Ser  Date Value Ref Range Status  03/13/2023 1.31 (H) 0.76 - 1.27 mg/dL Final         Passed - Completed PHQ-2 or PHQ-9 in the last 360 days      Passed - Valid encounter within last 12 months    Recent Outpatient Visits           1 month ago Annual physical exam   [place] [facility] [place], Netta Neat, DO   2 months ago Ingrowing left great toenail   [place] [facility] [name], DO   7 months ago DDD (degenerative disc disease), cervical   [place] [facility] [name], DO   10 months ago Acute pain of left shoulder   [place] [facility] [place], Oswaldo [place], [place]   1 year ago Acute non-recurrent frontal sinusitis   [place] [facility] [name], DO       Future Appointments             In 2 months Gollan, Tollie Pizza, MD [facility] [place]   In 5 months Althea Charon, Netta Neat, DO [place] [facility]             levothyroxine (SYNTHROID) 25 MCG tablet 90 tablet 1    Sig: Take 1 tablet (25 mcg total) by mouth daily.     Endocrinology:  Hypothyroid Agents Passed - 04/26/2023  1:05 PM      Passed - TSH in normal range and within 360 days    TSH  Date Value Ref Range Status  03/13/2023 2.430 0.450 - 4.500 uIU/mL Final         Passed -  Valid encounter within last 12 months    Recent Outpatient Visits           1 month ago Annual physical exam   [place] [facility] [place], Netta Neat, DO   2 months ago Ingrowing left great toenail   [place] [facility] [name], DO   7 months ago DDD (degenerative disc disease), cervical   [place] [facility] [name], DO   10 months ago Acute pain of left shoulder   [place] [facility]um, Oswaldo [place], [place]   1 year ago Acute non-recurrent frontal sinusitis   [place] [facility] [name], DO       Future Appointments             In 2 months Gollan, Tollie Pizza, MD [facility] [place]   In 5 months Althea Charon, Netta Neat, DO [place] [facility], [facility]

## 2023-04-26 NOTE — Telephone Encounter (Signed)
 Prescription sent to preferred pharmacy

## 2023-04-30 ENCOUNTER — Ambulatory Visit: Payer: Medicare Other | Admitting: Family Medicine

## 2023-04-30 ENCOUNTER — Encounter: Payer: Self-pay | Admitting: Family Medicine

## 2023-04-30 VITALS — BP 124/72 | HR 93 | Temp 97.7°F | Ht 66.0 in | Wt 203.0 lb

## 2023-04-30 DIAGNOSIS — J0110 Acute frontal sinusitis, unspecified: Secondary | ICD-10-CM | POA: Diagnosis not present

## 2023-04-30 MED ORDER — BENZONATATE 100 MG PO CAPS: 100.0000 mg | ORAL_CAPSULE | Freq: Three times a day (TID) | ORAL | 0 refills | Status: DC | PRN

## 2023-04-30 MED ORDER — AZITHROMYCIN 250 MG PO TABS: ORAL_TABLET | ORAL | 0 refills | Status: DC

## 2023-04-30 MED ORDER — IPRATROPIUM BROMIDE 0.06 % NA SOLN: 2.0000 | Freq: Four times a day (QID) | NASAL | 0 refills | Status: AC

## 2023-04-30 NOTE — Patient Instructions (Addendum)
 Thank you for coming to the office today.  Likely sinusitis  Start Azithromycin  Z pak (antibiotic) 2 tabs day 1, then 1 tab x 4 days, complete entire course even if improved  Start Tessalon  Perls take 1 capsule up to 3 times a day as needed for cough  Start Atrovent  nasal spray decongestant 2 sprays in each nostril up to 4 times daily for 7 days   Please schedule a Follow-up Appointment to: Return if symptoms worsen or fail to improve.  If you have any other questions or concerns, please feel free to call the office or send a message through MyChart. You may also schedule an earlier appointment if necessary.  Additionally, you may be receiving a survey about your experience at our office within a few days to 1 week by e-mail or mail. We value your feedback.  Marsa Officer, DO [facility], [place]

## 2023-04-30 NOTE — Progress Notes (Signed)
 Subjective:    Patient ID: Adrian Zavala, male    DOB: 12-30-1950, 73 y.o.   MRN: [id number]  Adrian Zavala is a 73 y.o. male presenting on 04/30/2023 for Sinusitis  Patient presents for a same day appointment.   HPI  Discussed the use of AI scribe software for clinical note transcription with the patient, who gave verbal consent to proceed.  History of Present Illness    Adrian Zavala is a 73 year old male with Wegener's granulomatosis who presents with persistent nasal congestion and cough.  He has been experiencing nasal congestion primarily on the right side for the past three weeks. The congestion is steady and sometimes alleviated with the use of ipratropium nasal spray, although it can cause more harm than good. He occasionally wakes up due to a whistling noise, which he attributes to the congestion.  He has a mild cough triggered by nasal drainage, especially at night. The cough is not intense but is felt in the throat, likely due to post-nasal drip. He has used Tessalon  Perles in the past but does not recall their effectiveness.  He has a significant history of Wegener's granulomatosis, diagnosed after prolonged sinus infections and nasal inflammation. The condition previously spread to his lungs, liver, kidneys, bladder, and prostate, causing severe sinus infections with thick scabs and pus, leading to bleeding when removed.  He has a history of allergic reactions to multiple antibiotics  Last treated for sinusitis 03/2022 with Zpak with success  He mentions using triple antibiotic ointment to clear up nasal ulcers     Past Surgical History:  Procedure Laterality Date   ANTERIOR CERVICAL DECOMP/DISCECTOMY FUSION N/A 09/05/2022   Procedure: C5-7 ANTERIOR CERVICAL DISCECTOMY AND FUSION (HEDRON);  Surgeon: Clois Fret, MD;  Location: [facility];  Service: Neurosurgery;  Laterality: N/A;   CATARACT EXTRACTION W/ INTRAOCULAR LENS  IMPLANT, BILATERAL Bilateral     CHOLECYSTECTOMY     with exploration; has metal sutures   COLONOSCOPY N/A 10/25/2014   Procedure: COLONOSCOPY;  Surgeon: Rogelia Copping, MD;  Location: [facility];  Service: Gastroenterology;  Laterality: N/A;   FEMORAL REVISION Right 10/25/2021   Procedure: REVISION FEMORAL STEM RIGHT HIP;  Surgeon: Edie Norleen PARAS, MD;  Location: [facility];  Service: Orthopedics;  Laterality: Right;   LASER RESURFACING Bilateral    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   Procedure: TOTAL HIP ARTHROPLASTY;  Surgeon: Edie Norleen PARAS, MD;  Location: [facility];  Service: Orthopedics;  Laterality: Right;   TREATMENT FISTULA ANAL     closure with rectal advancement flap   VASECTOMY          03/25/2023    3:33 PM 02/19/2023    4:33 PM 09/18/2022   11:33 AM  Depression screen PHQ 2/9  Decreased Interest 0 0 0  Down, Depressed, Hopeless 0 0 0  PHQ - 2 Score 0 0 0  Altered sleeping   0  Tired, decreased energy   0  Change in appetite   0  Feeling bad or failure about yourself    0  Trouble concentrating   0  Moving slowly or fidgety/restless   0  Suicidal thoughts   0  PHQ-9 Score   0  Difficult doing work/chores   Not difficult at all       03/25/2023    3:33 PM 02/19/2023    4:33 PM 09/18/2022   11:33 AM 03/09/2022    4:02 PM  GAD 7 : Generalized Anxiety Score  Nervous, Anxious,  on Edge 0 0 0 0  Control/stop worrying 0 0 0 0  Worry too much - different things 0  0 0  Trouble relaxing 0 0 0 0  Restless 0 0 0 0  Easily annoyed or irritable 0 0 0 0  Afraid - awful might happen 0 0 0 0  Total GAD 7 Score 0  0 0  Anxiety Difficulty  Not difficult at all Not difficult at all Not difficult at all    Social History   Tobacco Use   Smoking status: Former    Current packs/day: 0.00    Average packs/day: 1 pack/day for 30.0 years (30.0 ttl pk-yrs)    Types: Cigarettes    Start date: 08/25/1966    Quit date: 08/24/1996    Years since quitting: 26.6   Smokeless tobacco: Never  Vaping Use    Vaping status: Never Used  Substance Use Topics   Alcohol use: Yes    Alcohol/week: 3.0 standard drinks of alcohol    Types: 3 Cans of beer per week    Comment: occassional   Drug use: No    Review of Systems Per HPI unless specifically indicated above     Objective:    BP 124/72   Pulse 93   Temp 97.7 F (36.5 C)   Ht 5' 6 (1.676 m)   Wt 203 lb (92.1 kg)   SpO2 97%   BMI 32.77 kg/m   Wt Readings from Last 3 Encounters:  04/30/23 203 lb (92.1 kg)  03/28/23 200 lb 3.2 oz (90.8 kg)  03/25/23 202 lb (91.6 kg)    Physical Exam Vitals and nursing note reviewed.  Constitutional:      General: He is not in acute distress.    Appearance: He is well-developed. He is not diaphoretic.     Comments: Well-appearing, comfortable, cooperative  HENT:     Head: Normocephalic and atraumatic.  Eyes:     General:        Right eye: No discharge.        Left eye: No discharge.     Conjunctiva/sclera: Conjunctivae normal.  Neck:     Thyroid: No thyromegaly.  Cardiovascular:     Rate and Rhythm: Normal rate and regular rhythm.     Pulses: Normal pulses.     Heart sounds: Normal heart sounds. No murmur heard. Pulmonary:     Effort: Pulmonary effort is normal. No respiratory distress.     Breath sounds: No wheezing or rales.     Comments: Very mild tight breath sounds lower lung fields. No wheezing. Musculoskeletal:        General: Normal range of motion.     Cervical back: Normal range of motion and neck supple.  Lymphadenopathy:     Cervical: No cervical adenopathy.  Skin:    General: Skin is warm and dry.     Findings: No erythema or rash.  Neurological:     Mental Status: He is alert and oriented to person, place, and time. Mental status is at baseline.  Psychiatric:        Behavior: Behavior normal.     Comments: Well groomed, good eye contact, normal speech and thoughts     Results for orders placed or performed in visit on 03/21/23  ECHOCARDIOGRAM COMPLETE    Collection Time: 03/21/23  7:59 AM  Result Value Ref Range   AR max vel 2.27 cm2   AV Peak grad 13.4 mmHg   Ao pk vel 1.83  m/s   S' Lateral 2.00 cm   Area-P 1/2 3.31 cm2   AV Area VTI 2.15 cm2   AV Mean grad 7.0 mmHg   AV Area mean vel 2.04 cm2   Est EF 60 - 65%       Assessment & Plan:   Problem List Items Addressed This Visit   None Visit Diagnoses       Acute non-recurrent frontal sinusitis    -  Primary   Relevant Medications   benzonatate  (TESSALON ) 100 MG capsule   azithromycin  (ZITHROMAX  Z-PAK) 250 MG tablet   ipratropium (ATROVENT ) 0.06 % nasal spray        Chronic Sinusitis Persistent symptoms for 3 weeks despite use of ipratropium nasal spray AS NEEDED History of Wegener's granulomatosis with significant sinus involvement. Mostly controlled at this time. Last sinusitis treated here 03/2022. Multiple antibiotic allergies  -Start Azithromycin  (Z-Pak) to treat potential bacterial sinusitis. -Continue ipratropium nasal spray as needed for symptomatic relief. Re order - Start Tessalon  Perls take 1 capsule up to 3 times a day as needed for cough  Follow-up Monitor response to treatment. Patient to contact office if symptoms do not improve or worsen.  Reconsider Chest X-ray if indicated        No orders of the defined types were placed in this encounter.   Meds ordered this encounter  Medications   benzonatate  (TESSALON ) 100 MG capsule    Sig: Take 1 capsule (100 mg total) by mouth 3 (three) times daily as needed for cough.    Dispense:  30 capsule    Refill:  0   azithromycin  (ZITHROMAX  Z-PAK) 250 MG tablet    Sig: Take 2 tabs (500mg  total) on Day 1. Take 1 tab (250mg ) daily for next 4 days.    Dispense:  6 tablet    Refill:  0   ipratropium (ATROVENT ) 0.06 % nasal spray    Sig: Place 2 sprays into both nostrils 4 (four) times daily. For up to 5-7 days then stop.    Dispense:  15 mL    Refill:  0    Follow up plan: Return if symptoms worsen or fail  to improve.   Marsa Officer, DO [facility] 04/30/2023, 1:20 PM

## 2023-05-07 ENCOUNTER — Encounter: Payer: Self-pay | Admitting: Family Medicine

## 2023-05-07 ENCOUNTER — Ambulatory Visit (INDEPENDENT_AMBULATORY_CARE_PROVIDER_SITE_OTHER): Payer: Medicare Other | Admitting: Family Medicine

## 2023-05-07 VITALS — BP 100/48 | HR 91 | Ht 66.0 in | Wt 200.0 lb

## 2023-05-07 DIAGNOSIS — J0110 Acute frontal sinusitis, unspecified: Secondary | ICD-10-CM | POA: Diagnosis not present

## 2023-05-07 MED ORDER — LEVOFLOXACIN 500 MG PO TABS: 500.0000 mg | ORAL_TABLET | Freq: Every day | ORAL | 0 refills | Status: DC

## 2023-05-07 NOTE — Patient Instructions (Addendum)
Thank you for coming to the office today.  Pause Valsartan talk to Cardiology adjust med  Start taking Levaquin antibiotic 500mg  daily x 7 days  Caution high intensity activity and contact if still not improved, likely agree that Rheumatology would be next   Please schedule a Follow-up Appointment to: Return if symptoms worsen or fail to improve.  If you have any other questions or concerns, please feel free to call the office or send a message through MyChart. You may also schedule an earlier appointment if necessary.  Additionally, you may be receiving a survey about your experience at our office within a few days to 1 week by e-mail or mail. We value your feedback.  Saralyn Pilar, DO [facility], [place]

## 2023-05-07 NOTE — Progress Notes (Unsigned)
Subjective:    Patient ID: Adrian Zavala, male    DOB: 1951/01/25, 73 y.o.   MRN: [id number]  Adrian Zavala is a 73 y.o. male presenting on 05/07/2023 for Sinusitis   HPI  Discussed the use of AI scribe software for clinical note transcription with the patient, who gave verbal consent to proceed.  History of Present Illness    Adrian Zavala is a 73 year old male who presents with persistent sinus symptoms.  He has persistent sinus symptoms that initially improved with a course of azithromycin (Z-Pak) from last visit 04/30/23 and tessalon perls but worsened after completing the medication. Notable improvement in lower respiratory symptoms. But now the upper Sinusitis symptoms remain present, with significant pressure in the right eye noted on "Sunday and Monday. He experiences yellow nasal discharge upon blowing his nose, which clears after emptying the cavity, but symptoms tend to recollect after sitting for a few hours.  No significant lower respiratory symptoms are present. He is 'far better' in that regard, with no wheezing or noisy breathing. No fevers have been experienced, and he received a flu vaccine in December.  His medication allergies include sulfa, penicillin, and cephalexin. He has a history of taking Levaquin without issues despite having a dilated aorta. He is currently taking Valsartan, which he has been cutting into fourths due to dizziness, and he reports his blood pressure is lower than usual lately. Followed by Cardiology and they have been advising to reduce the Valsartan dose from 320 to 180 then to quarter tab = 80mg       03/25/2023    3:33 PM 02/19/2023    4:33 PM 09/18/2022   11:33 AM  Depression screen PHQ 2/9  Decreased Interest 0 0 0  Down, Depressed, Hopeless 0 0 0  PHQ - 2 Score 0 0 0  Altered sleeping   0  Tired, decreased energy   0  Change in appetite   0  Feeling bad or failure about yourself    0  Trouble concentrating   0  Moving slowly or  fidgety/restless   0  Suicidal thoughts   0  PHQ-9 Score   0  Difficult doing work/chores   Not difficult at all       12" /30/2024    3:33 PM 02/19/2023    4:33 PM 09/18/2022   11:33 AM 03/09/2022    4:02 PM  GAD 7 : Generalized Anxiety Score  Nervous, Anxious, on Edge 0 0 0 0  Control/stop worrying 0 0 0 0  Worry too much - different things 0  0 0  Trouble relaxing 0 0 0 0  Restless 0 0 0 0  Easily annoyed or irritable 0 0 0 0  Afraid - awful might happen 0 0 0 0  Total GAD 7 Score 0  0 0  Anxiety Difficulty  Not difficult at all Not difficult at all Not difficult at all    Social History   Tobacco Use   Smoking status: Former    Current packs/day: 0.00    Average packs/day: 1 pack/day for 30.0 years (30.0 ttl pk-yrs)    Types: Cigarettes    Start date: 08/25/1966    Quit date: 08/24/1996    Years since quitting: 26.7   Smokeless tobacco: Never  Vaping Use   Vaping status: Never Used  Substance Use Topics   Alcohol use: Yes    Alcohol/week: 3.0 standard drinks of alcohol    Types: 3 Cans of beer  per week    Comment: occassional   Drug use: No    Review of Systems Per HPI unless specifically indicated above     Objective:    BP (!) 100/48 (BP Location: Left Arm, Cuff Size: Normal)   Pulse 91   Ht 5\' 6"  (1.676 m)   Wt 200 lb (90.7 kg)   SpO2 96%   BMI 32.28 kg/m   Wt Readings from Last 3 Encounters:  05/07/23 200 lb (90.7 kg)  04/30/23 203 lb (92.1 kg)  03/28/23 200 lb 3.2 oz (90.8 kg)    Physical Exam Vitals and nursing note reviewed.  Constitutional:      General: He is not in acute distress.    Appearance: He is well-developed. He is not diaphoretic.     Comments: Well-appearing, comfortable, cooperative  HENT:     Head: Normocephalic and atraumatic.  Eyes:     General:        Right eye: No discharge.        Left eye: No discharge.     Conjunctiva/sclera: Conjunctivae normal.  Neck:     Thyroid: No thyromegaly.  Cardiovascular:     Rate and  Rhythm: Normal rate and regular rhythm.     Pulses: Normal pulses.     Heart sounds: Normal heart sounds. No murmur heard. Pulmonary:     Effort: Pulmonary effort is normal. No respiratory distress.     Breath sounds: Normal breath sounds. No wheezing or rales.  Musculoskeletal:        General: Normal range of motion.     Cervical back: Normal range of motion and neck supple.  Lymphadenopathy:     Cervical: No cervical adenopathy.  Skin:    General: Skin is warm and dry.     Findings: No erythema or rash.  Neurological:     Mental Status: He is alert and oriented to person, place, and time. Mental status is at baseline.  Psychiatric:        Behavior: Behavior normal.     Comments: Well groomed, good eye contact, normal speech and thoughts     Results for orders placed or performed in visit on 03/21/23  ECHOCARDIOGRAM COMPLETE   Collection Time: 03/21/23  7:59 AM  Result Value Ref Range   AR max vel 2.27 cm2   AV Peak grad 13.4 mmHg   Ao pk vel 1.83 m/s   S' Lateral 2.00 cm   Area-P 1/2 3.31 cm2   AV Area VTI 2.15 cm2   AV Mean grad 7.0 mmHg   AV Area mean vel 2.04 cm2   Est EF 60 - 65%       Assessment & Plan:   Problem List Items Addressed This Visit   None Visit Diagnoses       Acute non-recurrent frontal sinusitis    -  Primary   Relevant Medications   levofloxacin (LEVAQUIN) 500 MG tablet        Sinusitis PMH Wegener's Granulomatosis Persistent symptoms despite recent Z-Pak. Noted improvement during antibiotic course but symptoms recurred after completion. Yellow nasal discharge and sinus pressure noted. -Start Levaquin 500mg  daily for 7 days. Precautions given  Potential Vasculitis / Wegener's Granulomatosis  Patient has a history of vasculitis and is concerned that current symptoms may be related. -If no improvement with Levaquin, patient to consult with rheumatology.  Hypertension Dizziness reported with Valsartan use, even with dose reduction (320mg   > to half then half again approx 80mg ?) Current blood pressure  reading low. -Pause Valsartan and consult with cardiology for medication adjustment.   General Health Maintenance -Check blood pressure after medication adjustment.         No orders of the defined types were placed in this encounter.   Meds ordered this encounter  Medications   levofloxacin (LEVAQUIN) 500 MG tablet    Sig: Take 1 tablet (500 mg total) by mouth daily.    Dispense:  7 tablet    Refill:  0    Follow up plan: Return if symptoms worsen or fail to improve.    Saralyn Pilar, DO [facility] 05/07/2023, 11:38 AM

## 2023-05-13 DIAGNOSIS — M3130 Wegener's granulomatosis without renal involvement: Secondary | ICD-10-CM | POA: Diagnosis not present

## 2023-05-13 DIAGNOSIS — R0600 Dyspnea, unspecified: Secondary | ICD-10-CM | POA: Diagnosis not present

## 2023-05-13 DIAGNOSIS — G500 Trigeminal neuralgia: Secondary | ICD-10-CM | POA: Diagnosis not present

## 2023-06-09 ENCOUNTER — Other Ambulatory Visit: Payer: Self-pay | Admitting: Family Medicine

## 2023-06-09 DIAGNOSIS — M150 Primary generalized (osteo)arthritis: Secondary | ICD-10-CM

## 2023-06-09 DIAGNOSIS — M5030 Other cervical disc degeneration, unspecified cervical region: Secondary | ICD-10-CM

## 2023-06-11 ENCOUNTER — Telehealth: Payer: Self-pay

## 2023-06-11 NOTE — Telephone Encounter (Signed)
 Refill request

## 2023-06-11 NOTE — Telephone Encounter (Signed)
 Requested medications are due for refill today.  yes  Requested medications are on the active medications list.  yes  Last refill. 01/30/2023 #120 2 rf  Future visit scheduled.   yes  Notes to clinic.  Refill not delegated.    Requested Prescriptions  Pending Prescriptions Disp Refills   traMADol (ULTRAM) 50 MG tablet [Pharmacy Med Name: TRAMADOL HCL 50MG  TABLET] 120 tablet     Sig: TAKE 1 TABLET BY MOUTH EVERY 6 HOURS AS NEEDED FOR MODERATE PAIN     Not Delegated - Analgesics:  Opioid Agonists Failed - 06/11/2023 10:20 AM      Failed - This refill cannot be delegated      Failed - Urine Drug Screen completed in last 360 days      Passed - Valid encounter within last 3 months    Recent Outpatient Visits           2 months ago Annual physical exam   [place] [facility] [place], Netta Neat, DO   3 months ago Ingrowing left great toenail   [place] [facility] [name], DO   8 months ago DDD (degenerative disc disease), cervical   [place] [facility] [name], DO   11 months ago Acute pain of left shoulder   [place] [facility] [place], Oswaldo [place], [place]   1 year ago Acute non-recurrent frontal sinusitis   [place] [facility] [name], DO       Future Appointments             In 2 weeks Gollan, Tollie Pizza, MD [facility]   In 3 months Althea Charon, Netta Neat, DO  [facility], [facility]

## 2023-06-18 DIAGNOSIS — K50919 Crohn's disease, unspecified, with unspecified complications: Secondary | ICD-10-CM | POA: Diagnosis not present

## 2023-06-18 DIAGNOSIS — M3130 Wegener's granulomatosis without renal involvement: Secondary | ICD-10-CM | POA: Diagnosis not present

## 2023-06-18 DIAGNOSIS — Z7952 Long term (current) use of systemic steroids: Secondary | ICD-10-CM | POA: Diagnosis not present

## 2023-06-18 DIAGNOSIS — L409 Psoriasis, unspecified: Secondary | ICD-10-CM | POA: Diagnosis not present

## 2023-06-29 NOTE — Progress Notes (Unsigned)
 Cardiology Office Note  Date:  07/01/2023   ID:  Adrian Zavala, DOB 01-Apr-1950, MRN [id number]  PCP:  [name], DO   Chief Complaint  Patient presents with   Follow-up    3-4 month follow up visit. Patient is doing ok on today. Meds reviewed.    HPI:  [name] is a  73 year old gentleman with a history of  Wegener's, s/p chemo 1991,   Cushing's, on chronic prednisone,  long history of smoking who stopped in 1998,  hyperlipidemia heart murmur (Previous echocardiogram concerning for bicuspid aortic valve),  coronary artery disease, by CT  CT coronary calcium score 05/07/2016, Score of 497 Mild to moderately dilated ascending aorta 4.1 cm in 04/2016 Vertebral artery stenosis on MRI hyperlipidemia.  Coronary calcium score of 975  in 2022 Who presents for follow-up of his dilated aorta, coronary calcification, moderate RCA and LAD stenosis concern for bicuspid aortic valve  Last seen by myself in clinic 9/24 Last seen by one of our providers 1/25  In follow-up he reports that his blood pressure was running low on valsartan even on half pill, quarter pill Reports he has stopped valsartan, no longer on amlodipine which was causing leg swelling Monitoring blood pressure closely at home 117 to 130 systolic at home  No regular exercise program but likes to go shopping with his wife, does errands Uses a cane for balance  Chronic SOB, recent workup  echo showing normal ejection fraction, no significant valvular heart disease No mention of aorta dilation  Labs reviewed A1c 6.2 Total chol 125, 46  EKG personally reviewed by myself on todays visit EKG Interpretation Date/Time:  Monday July 01 2023 10:15:40 EDT Ventricular Rate:  69 PR Interval:  146 QRS Duration:  130 QT Interval:  398 QTC Calculation: 426 R Axis:   -4  Text Interpretation: Normal sinus rhythm Right bundle branch block When compared with ECG of 21-Dec-2022 10:36, No significant change was  found Confirmed by [name] [phone]) on 07/01/2023 10:40:45 AM   Cardiac CTA 5/22: CT FFR analysis didn't show any significant stenosis.   Echo December 2024 EF 60 to 65%  Neck surgery, plates, 9/52/84 ACDF C5-C7   Retired dec 2024 from [employer]  right total hip arthroplasty on 10/24/2021 with Dr. Leron Croak    on Crestor 40 daily with Zetia Previous myalgias on Lipitor   CT coronary calcium score 05/07/2016 Score of 497 calcified aortic valve, Dilated ascending aorta 4.1 cm  Negative trigeminal protocol MRI of the brain, with no structural abnormality to explain patient's symptoms identified. 2. Age-appropriate cerebral atrophy with mild-to-moderate chronic microvascular ischemic disease, grossly similar to previous.   MRA: 2 mm suspected A-comm aneurysm, Moderate stenosis RIGHT vertebral artery origin.  Stress test 05/14/2016 performed Showing no ischemia normal ejection fraction   Wegener's, long history of chemotherapy, on chronic methotrexate once per week, on chronic prednisone   Reports he's been on cholesterol medication since the 1980s, when it first came out   Prior history and the [employer], Previous issues with gallbladder disease in his 61s   episode of nausea vomiting in November 2016  PMH:   has a past medical history of Anemia, Aortic atherosclerosis (HCC), Aortic stenosis (06/10/2017), Arthritis, Ascending aorta dilatation (HCC) (05/07/2016), BPH (benign prostatic hyperplasia), Cervical radiculopathy, Coronary artery disease, Crohn's disease (HCC), 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HCC), Diastolic dysfunction (08/15/2020), Fatty liver disease, nonalcoholic, Gangrene of gallbladder, GERD (gastroesophageal reflux disease), 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HCC), Heart murmur, Hemorrhoid, Hypercholesteremia, Hypertension, Hypothyroidism, IBS (irritable  bowel syndrome), Inflammatory eye disease, Long term current use of systemic steroids, Long-term current use of  bisphosphonate, Lower extremity weakness, Lumbar stenosis, Lung disorder, Osteopenia, Osteoporosis, Pre-diabetes, RBBB (right bundle branch block), Seasonal allergies, Spinal stenosis in cervical region, Spondylolisthesis of cervical region, and Wegener's granulomatosis.  PSH:    Past Surgical History:  Procedure Laterality Date   ANTERIOR CERVICAL DECOMP/DISCECTOMY FUSION N/A 09/05/2022   Procedure: C5-7 ANTERIOR CERVICAL DISCECTOMY AND FUSION (HEDRON);  Surgeon: Venetia Night, MD;  Location: [facility];  Service: Neurosurgery;  Laterality: N/A;   CATARACT EXTRACTION W/ INTRAOCULAR LENS  IMPLANT, BILATERAL Bilateral    CHOLECYSTECTOMY     with exploration; has "metal sutures"   COLONOSCOPY N/A 10/25/2014   Procedure: COLONOSCOPY;  Surgeon: Midge Minium, MD;  Location: [facility];  Service: Gastroenterology;  Laterality: N/A;   FEMORAL REVISION Right 10/25/2021   Procedure: REVISION FEMORAL STEM RIGHT HIP;  Surgeon: Christena Flake, MD;  Location: [facility];  Service: Orthopedics;  Laterality: Right;   LASER RESURFACING Bilateral    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   Procedure: TOTAL HIP ARTHROPLASTY;  Surgeon: Christena Flake, MD;  Location: [facility];  Service: Orthopedics;  Laterality: Right;   TREATMENT FISTULA ANAL     closure with rectal advancement flap   VASECTOMY      Current Outpatient Medications  Medication Sig Dispense Refill   acetaminophen (TYLENOL) 650 MG CR tablet Take 650 mg by mouth every 8 (eight) hours as needed for pain.     benzonatate (TESSALON) 100 MG capsule Take 1 capsule (100 mg total) by mouth 3 (three) times daily as needed for cough. 30 capsule 0   calcium carbonate (OS-CAL) 600 MG TABS tablet Take 600 mg by mouth daily. AM     Cholecalciferol (VITAMIN D3) 25 MCG (1000 UT) CAPS Take 1 capsule by mouth daily.     ezetimibe (ZETIA) 10 MG tablet Take 1 tablet (10 mg total) by mouth daily. 90 tablet 3   famotidine (PEPCID) 40 MG tablet Take 1 tablet  (40 mg total) by mouth daily. 90 tablet 1   FIBER PO Take 1 capsule by mouth 2 (two) times daily.     fluticasone (FLONASE) 50 MCG/ACT nasal spray Place 2 sprays into both nostrils daily. Use for 4-6 weeks then stop and use seasonally or as needed. 16 g 3   gabapentin (NEURONTIN) 300 MG capsule Take 1 capsule (300 mg total) by mouth 3 (three) times daily. 270 capsule 1   ibandronate (BONIVA) 150 MG tablet Take 150 mg by mouth every 30 (thirty) days.     ipratropium (ATROVENT) 0.06 % nasal spray Place 2 sprays into both nostrils 4 (four) times daily. For up to 5-7 days then stop. 15 mL 0   levothyroxine (SYNTHROID) 25 MCG tablet Take 1 tablet (25 mcg total) by mouth daily. 90 tablet 1   loratadine (CLARITIN) 10 MG tablet Take 10 mg by mouth every morning.     Magnesium 200 MG TABS Take 200 mg by mouth daily.     methocarbamol (ROBAXIN) 500 MG tablet Take 1 tablet (500 mg total) by mouth every 6 (six) hours as needed for muscle spasms. 120 tablet 0   niacin (NIASPAN) 500 MG CR tablet Take 500 mg by mouth at bedtime.     predniSONE (DELTASONE) 5 MG tablet Take 5 mg by mouth daily.     rosuvastatin (CRESTOR) 40 MG tablet Take 1 tablet (40 mg total) by mouth daily. 90 tablet 3  traMADol (ULTRAM) 50 MG tablet TAKE 1 TABLET BY MOUTH EVERY 6 HOURS AS NEEDED FOR MODERATE PAIN 120 tablet 2   levofloxacin (LEVAQUIN) 500 MG tablet Take 1 tablet (500 mg total) by mouth daily. (Patient not taking: Reported on 07/01/2023) 7 tablet 0   valsartan (DIOVAN) 320 MG tablet Take 0.5 tablets (160 mg total) by mouth daily. (Patient not taking: Reported on 07/01/2023) 45 tablet 3   No current facility-administered medications for this visit.     Allergies:   Cephalexin, Penicillin g, Sulfa antibiotics, Sulfamethoxazole-trimethoprim, Codeine, Ampicillin, Penicillins, Quinolones, Prilosec [omeprazole], and Tetracyclines & related   Social History:  The patient  reports that he quit smoking about 26 years ago. His smoking  use included cigarettes. He started smoking about 56 years ago. He has a 30 pack-year smoking history. He has never used smokeless tobacco. He reports current alcohol use of about 3.0 standard drinks of alcohol per week. He reports that he does not use drugs.   Family History:   Family history is unknown by patient.    Review of Systems: Review of Systems  Constitutional: Negative.   HENT: Negative.    Respiratory: Negative.    Cardiovascular: Negative.   Gastrointestinal: Negative.   Musculoskeletal:  Positive for joint pain.  Neurological: Negative.   Psychiatric/Behavioral: Negative.    All other systems reviewed and are negative.   PHYSICAL EXAM: VS:  BP (!) 146/72   Pulse 69   Ht 5\' 6"  (1.676 m)   Wt 203 lb 3.2 oz (92.2 kg)   SpO2 97%   BMI 32.80 kg/m  , BMI Body mass index is 32.8 kg/m. Constitutional:  oriented to person, place, and time. No distress.  HENT:  Head: Grossly normal Eyes:  no discharge. No scleral icterus.  Neck: No JVD, no carotid bruits  Cardiovascular: Regular rate and rhythm, no murmurs appreciated Pulmonary/Chest: Clear to auscultation bilaterally, no wheezes or rails Abdominal: Soft.  no distension.  no tenderness.  Musculoskeletal: Normal range of motion Neurological:  normal muscle tone. Coordination normal. No atrophy Skin: Skin warm and dry Psychiatric: normal affect, pleasant  Recent Labs: 03/13/2023: ALT 14; BUN 26; Creatinine, Ser 1.31; Hemoglobin 13.1; Platelets 171; Potassium 4.9; Sodium 143; TSH 2.430    Lipid Panel Lab Results  Component Value Date   CHOL 125 03/13/2023   HDL 55 03/13/2023   LDLCALC 46 03/13/2023   TRIG 141 03/13/2023    Wt Readings from Last 3 Encounters:  07/01/23 203 lb 3.2 oz (92.2 kg)  05/07/23 200 lb (90.7 kg)  04/30/23 203 lb (92.1 kg)     ASSESSMENT AND PLAN:  Mixed hyperlipidemia Cholesterol is at goal on the current lipid regimen. No changes to the medications were made.  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 No further workup at this time. Continue  current medication regimen.   Orders Placed This Encounter  Procedures   EKG 12-Lead     Signed, Dossie Arbour, M.D., Ph.D. 07/01/2023  [facility] [place], [place] [phone]

## 2023-07-01 ENCOUNTER — Ambulatory Visit: Payer: Medicare Other | Attending: Cardiovascular Disease | Admitting: Cardiovascular Disease

## 2023-07-01 ENCOUNTER — Encounter: Payer: Self-pay | Admitting: Cardiovascular Disease

## 2023-07-01 VITALS — BP 146/72 | HR 69 | Ht 66.0 in | Wt 203.2 lb

## 2023-07-01 DIAGNOSIS — Q2381 Bicuspid aortic valve: Secondary | ICD-10-CM

## 2023-07-01 DIAGNOSIS — E785 Hyperlipidemia, unspecified: Secondary | ICD-10-CM | POA: Diagnosis not present

## 2023-07-01 DIAGNOSIS — I6509 Occlusion and stenosis of unspecified vertebral artery: Secondary | ICD-10-CM

## 2023-07-01 DIAGNOSIS — E782 Mixed hyperlipidemia: Secondary | ICD-10-CM

## 2023-07-01 DIAGNOSIS — I25118 Atherosclerotic heart disease of native coronary artery with other forms of angina pectoris: Secondary | ICD-10-CM | POA: Diagnosis not present

## 2023-07-01 DIAGNOSIS — I5032 Chronic diastolic (congestive) heart failure: Secondary | ICD-10-CM

## 2023-07-01 DIAGNOSIS — I7121 Aneurysm of the ascending aorta, without rupture: Secondary | ICD-10-CM

## 2023-07-01 DIAGNOSIS — I77810 Thoracic aortic ectasia: Secondary | ICD-10-CM

## 2023-07-01 DIAGNOSIS — I10 Essential (primary) hypertension: Secondary | ICD-10-CM | POA: Diagnosis not present

## 2023-07-01 DIAGNOSIS — R0602 Shortness of breath: Secondary | ICD-10-CM | POA: Diagnosis not present

## 2023-07-01 NOTE — Patient Instructions (Signed)

## 2023-07-09 ENCOUNTER — Ambulatory Visit: Admitting: Family Medicine

## 2023-07-09 ENCOUNTER — Encounter: Payer: Self-pay | Admitting: Family Medicine

## 2023-07-09 VITALS — BP 130/78 | HR 99 | Ht 66.0 in | Wt 194.0 lb

## 2023-07-09 DIAGNOSIS — Z0000 Encounter for general adult medical examination without abnormal findings: Secondary | ICD-10-CM

## 2023-07-09 NOTE — Patient Instructions (Addendum)
 Thank you for coming to the office today.  Recommend vaccine updates  Prevnar-20 vaccine for pneumonia coverage at pharmacy or here.  Shingrix vaccine 2 doses at pharmacy 2-6 months apart  Please schedule a Follow-up Appointment to: Return if symptoms worsen or fail to improve.  If you have any other questions or concerns, please feel free to call the office or send a message through MyChart. You may also schedule an earlier appointment if necessary.  Additionally, you may be receiving a survey about your experience at our office within a few days to 1 week by e-mail or mail. We value your feedback.  Domingo Friend, DO [facility], [place]

## 2023-07-09 NOTE — Progress Notes (Signed)
 Patient: Adrian Zavala, Male    DOB: October 04, 1950, 73 y.o.   MRN: [id number]  Visit Date: 07/09/2023  Today's Provider: Saralyn Pilar, DO   Chief Complaint  Patient presents with   Medicare Wellness    Subjective:   Adrian Zavala is a 73 y.o. male who presents today for "Welcome to Medicare Visit"  Welcome to [facility] Visit:   HPI  Discussed the use of AI scribe software for clinical note transcription with the patient, who gave verbal consent to proceed.  History of Present Illness   Adrian Zavala is a 73 year old male presenting for a Medicare welcome visit.  He recently had an EKG performed, which was normal. He has discontinued his blood pressure medications and is pleased with this outcome. Declines repeat EKG  He discusses his vaccination history, noting that his flu shot is up to date. He has not yet received the shingles vaccine and is eligible for the Prevnar 20 for pneumonia, as it has been five years since his last pneumonia shot.  He mentions upcoming medical appointments, including a follow-up with a neurosurgeon on June 5th, an eye doctor appointment next month, and a colonoscopy scheduled for next year.      Health Maintenance: - Due for Prevnar-20 vaccine - Due for Shingrix vaccine 2 doses 2-6 month apart at pharmacy  - Last Colonoscopy 10/25/14, negative Dr Servando Snare AGI. Next due 2026.  Chart reviewed and updated. Psychosocial and medical history were reviewed.  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   a.) cCTA 05/07/2016: measured 41 mm; b.) TTE 06/10/2017: measured 39 mm; c.) cCTA 08/04/2020: measured 39 mm; d.) TTE 08/15/2020: measured 38 mm   BPH (benign prostatic  hyperplasia)    Cervical radiculopathy    Coronary artery disease    Crohn's disease (HCC)    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HCC)    a.) exogenous 2/2 prolonged corticosteroid use   Diastolic dysfunction 08/15/2020   a.) TTE 08/15/2020: EF 60-65%, mild MR, mod AoV sclerosis, G1DD   Fatty liver disease, nonalcoholic    Gangrene of gallbladder    GERD (gastroesophageal reflux disease)    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HCC)    Heart murmur    Hemorrhoid    Hypercholesteremia    Hypertension    Hypothyroidism    IBS (irritable bowel syndrome)    Inflammatory eye disease    Long term current use of systemic steroids    a.) low dose prednisone   Long-term current use of bisphosphonate    a.) ibandronate   Lower extremity weakness    a.) s/p chemo for Wegener's   Lumbar stenosis    Lung disorder    S/P chemo for Wegener's   Osteopenia    Osteoporosis    Pre-diabetes    RBBB (right bundle branch block)    Seasonal allergies    Spinal stenosis in cervical region    Spondylolisthesis of cervical region    Wegener's granulomatosis    a.) s/p Tx with cyclophosphamide + MTX; ongoing treatment with chronic corticosteroids    Past Surgical History:  Procedure Laterality Date   ANTERIOR CERVICAL DECOMP/DISCECTOMY FUSION N/A 09/05/2022   Procedure: C5-7 ANTERIOR CERVICAL  DISCECTOMY AND FUSION (HEDRON);  Surgeon: Venetia Night, MD;  Location: [facility];  Service: Neurosurgery;  Laterality: N/A;   CATARACT EXTRACTION W/ INTRAOCULAR LENS  IMPLANT, BILATERAL Bilateral    CHOLECYSTECTOMY     with exploration; has "metal sutures"   COLONOSCOPY N/A 10/25/2014   Procedure: COLONOSCOPY;  Surgeon: Midge Minium, MD;  Location: [facility];  Service: Gastroenterology;  Laterality: N/A;   FEMORAL REVISION Right 10/25/2021   Procedure: REVISION FEMORAL STEM RIGHT HIP;  Surgeon: Christena Flake, MD;  Location: [facility];  Service: Orthopedics;  Laterality: Right;   LASER RESURFACING Bilateral     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   Procedure: TOTAL HIP ARTHROPLASTY;  Surgeon: Christena Flake, MD;  Location: [facility];  Service: Orthopedics;  Laterality: Right;   TREATMENT FISTULA ANAL     closure with rectal advancement flap   VASECTOMY      Family History  Family history unknown: Yes    Social History   Socioeconomic History   Marital status: Married    Spouse name: Elliott Lasecki   Number of children: 2   Years of education: Not on file   Highest education level: Bachelor's degree (e.g., BA, AB, BS)  Occupational History   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[employer], Educational psychologist)  Tobacco Use   Smoking status: Former    Current packs/day: 0.00    Average packs/day: 1 pack/day for 30.0 years (30.0 ttl pk-yrs)    Types: Cigarettes    Start date: 08/25/1966    Quit date: 08/24/1996    Years since quitting: 26.8   Smokeless tobacco: Never  Vaping Use   Vaping status: Never Used  Substance and Sexual Activity   Alcohol use: Yes    Alcohol/week: 3.0 standard drinks of alcohol    Types: 3 Cans of beer per week    Comment: occassional   Drug use: No   Sexual activity: Not on file  Other Topics Concern   Not on file  Social History Narrative   Not on file   Social Drivers of Health   Financial Resource Strain: Low Risk  (04/29/2023)   Overall Financial Resource Strain (CARDIA)    Difficulty of Paying Living Expenses: Not very hard  Food Insecurity: No Food Insecurity (04/29/2023)   Hunger Vital Sign    Worried About Running Out of Food in the Last Year: Never true    Ran Out of Food in the Last Year: Never true  Transportation Needs: No Transportation Needs (04/29/2023)   PRAPARE - Administrator, Civil Service (Medical): No    Lack of Transportation (Non-Medical): No  Physical Activity: Unknown (04/29/2023)   Exercise Vital Sign    Days of Exercise per Week: 0 days    Minutes of Exercise per Session: Patient declined  Stress: No Stress Concern Present (04/29/2023)    [employer] of Occupational Health - Occupational Stress Questionnaire    Feeling of Stress : Not at all  Social Connections: Moderately Integrated (04/29/2023)   Social Connection and Isolation Panel [NHANES]    Frequency of Communication with Friends and Family: Once a week    Frequency of Social Gatherings with Friends and Family: Once a week    Attends Religious Services: More than 4 times per year    Active Member of [employer] or Organizations: Yes    Attends Banker Meetings: 1 to 4 times per year    Marital Status: Married  Catering manager Violence: Not At Risk (09/05/2022)  Humiliation, Afraid, Rape, and Kick questionnaire    Fear of Current or Ex-Partner: No    Emotionally Abused: No    Physically Abused: No    Sexually Abused: No    Outpatient Encounter Medications as of 07/09/2023  Medication Sig   acetaminophen (TYLENOL) 650 MG CR tablet Take 650 mg by mouth every 8 (eight) hours as needed for pain.   calcium carbonate (OS-CAL) 600 MG TABS tablet Take 600 mg by mouth daily. AM   Cholecalciferol (VITAMIN D3) 25 MCG (1000 UT) CAPS Take 1 capsule by mouth daily.   ezetimibe (ZETIA) 10 MG tablet Take 1 tablet (10 mg total) by mouth daily.   famotidine (PEPCID) 40 MG tablet Take 1 tablet (40 mg total) by mouth daily.   FIBER PO Take 1 capsule by mouth 2 (two) times daily.   fluticasone (FLONASE) 50 MCG/ACT nasal spray Place 2 sprays into both nostrils daily. Use for 4-6 weeks then stop and use seasonally or as needed.   gabapentin (NEURONTIN) 300 MG capsule Take 1 capsule (300 mg total) by mouth 3 (three) times daily.   ibandronate (BONIVA) 150 MG tablet Take 150 mg by mouth every 30 (thirty) days.   ipratropium (ATROVENT) 0.06 % nasal spray Place 2 sprays into both nostrils 4 (four) times daily. For up to 5-7 days then stop.   levothyroxine (SYNTHROID) 25 MCG tablet Take 1 tablet (25 mcg total) by mouth daily.   loratadine (CLARITIN) 10 MG tablet Take 10 mg by  mouth every morning.   Magnesium 200 MG TABS Take 200 mg by mouth daily.   methocarbamol (ROBAXIN) 500 MG tablet Take 1 tablet (500 mg total) by mouth every 6 (six) hours as needed for muscle spasms.   niacin (NIASPAN) 500 MG CR tablet Take 500 mg by mouth at bedtime.   predniSONE (DELTASONE) 5 MG tablet Take 5 mg by mouth daily.   rosuvastatin (CRESTOR) 40 MG tablet Take 1 tablet (40 mg total) by mouth daily.   traMADol (ULTRAM) 50 MG tablet TAKE 1 TABLET BY MOUTH EVERY 6 HOURS AS NEEDED FOR MODERATE PAIN   benzonatate (TESSALON) 100 MG capsule Take 1 capsule (100 mg total) by mouth 3 (three) times daily as needed for cough. (Patient not taking: Reported on 07/09/2023)   levofloxacin (LEVAQUIN) 500 MG tablet Take 1 tablet (500 mg total) by mouth daily. (Patient not taking: Reported on 07/09/2023)   No facility-administered encounter medications on file as of 07/09/2023.      Fall Risk Assessment    07/09/2023    8:59 AM 03/25/2023    3:33 PM 02/19/2023    4:33 PM 09/18/2022   11:33 AM 03/09/2022    4:01 PM  Fall Risk   Falls in the past year? 0 1 0 1 0  Number falls in past yr:  0  1 0  Injury with Fall?  0  1 0  Risk for fall due to :    No Fall Risks No Fall Risks  Follow up    Falls evaluation completed Falls evaluation completed    Depression Screen See under rooming    07/09/2023    8:59 AM 03/25/2023    3:33 PM 02/19/2023    4:33 PM 09/18/2022   11:33 AM 03/09/2022    4:02 PM  Depression screen PHQ 2/9  Decreased Interest 0 0 0 0 0  Down, Depressed, Hopeless 0 0 0 0 0  PHQ - 2 Score 0 0 0 0 0  Altered  sleeping 1   0 0  Tired, decreased energy 2   0 1  Change in appetite 0   0 0  Feeling bad or failure about yourself  0   0 0  Trouble concentrating 2   0 1  Moving slowly or fidgety/restless 0   0 0  Suicidal thoughts 0   0 0  PHQ-9 Score 5   0 2  Difficult doing work/chores Not difficult at all   Not difficult at all Not difficult at all    Advanced  Directives Does patient have a HCPOA?    no If yes, name and contact information:  Does patient have a living will or MOST form?  no  Objective:   Vitals: BP 130/78 (BP Location: Right Arm, Patient Position: Sitting, Cuff Size: Normal)   Pulse 99   Ht 5\' 6"  (1.676 m)   Wt 194 lb (88 kg)   SpO2 99%   BMI 31.31 kg/m  Body mass index is 31.31 kg/m.  Filed Weights   07/09/23 0856  Weight: 194 lb (88 kg)    No results found.  Physical Exam Vitals and nursing note reviewed.  Constitutional:      General: He is not in acute distress.    Appearance: Normal appearance. He is well-developed. He is not diaphoretic.     Comments: Well-appearing, comfortable, cooperative  HENT:     Head: Normocephalic and atraumatic.  Eyes:     General:        Right eye: No discharge.        Left eye: No discharge.     Conjunctiva/sclera: Conjunctivae normal.  Cardiovascular:     Rate and Rhythm: Normal rate.  Pulmonary:     Effort: Pulmonary effort is normal.  Skin:    General: Skin is warm and dry.     Findings: No erythema or rash.  Neurological:     Mental Status: He is alert and oriented to person, place, and time.  Psychiatric:        Mood and Affect: Mood normal.        Behavior: Behavior normal.        Thought Content: Thought content normal.     Comments: Well groomed, good eye contact, normal speech and thoughts         No data to display               No data to display          Cognitive Testing - 6-CIT    07/09/2023    9:29 AM  6CIT Screen  What Year? 0 points  What month? 0 points  What time? 0 points  Count back from 20 0 points  Months in reverse 2 points  Repeat phrase 0 points  Total Score 2 points     Assessment & Plan:    Annual Wellness Visit  Reviewed patient's Family Medical History Reviewed and updated list of patient's medical providers Assessment of cognitive impairment was done Assessed patient's functional ability Established a  written schedule for health screening services Health Risk Assessent Completed and Reviewed  Exercise Activities and Dietary recommendations  Goals   None     Immunization History  Administered Date(s) Administered   Fluad Quad(high Dose 65+) 12/08/2018, 12/09/2019, 12/19/2020, 03/10/2022   Fluad Trivalent(High Dose 65+) 03/25/2023   Influenza, High Dose Seasonal PF 12/12/2015   Influenza,inj,Quad PF,6+ Mos 12/16/2017   Moderna Sars-Covid-2 Vaccination 05/18/2019, 06/16/2019, 03/05/2020   Pneumococcal Conjugate-13  07/05/2014   Pneumococcal Polysaccharide-23 03/23/2013, 04/28/2018   Tdap 03/26/2006, 10/01/2016   Zoster, Live 07/04/2014    Health Maintenance  Topic Date Due   Zoster Vaccines- Shingrix (1 of 2) 09/12/1970   COVID-19 Vaccine (4 - 2024-25 season) 07/25/2023 (Originally 11/25/2022)   INFLUENZA VACCINE  10/25/2023   Medicare Annual Wellness (AWV)  07/08/2024   Colonoscopy  10/24/2024   DTaP/Tdap/Td (3 - Td or Tdap) 10/02/2026   Pneumonia Vaccine 72+ Years old  Completed   Hepatitis C Screening  Completed   HPV VACCINES  Aged Out   Meningococcal B Vaccine  Aged Out    Discussed health benefits of physical activity, and encouraged him to engage in regular exercise appropriate for his age and condition.   No orders of the defined types were placed in this encounter.   Current Outpatient Medications:    acetaminophen (TYLENOL) 650 MG CR tablet, Take 650 mg by mouth every 8 (eight) hours as needed for pain., Disp: , Rfl:    calcium carbonate (OS-CAL) 600 MG TABS tablet, Take 600 mg by mouth daily. AM, Disp: , Rfl:    Cholecalciferol (VITAMIN D3) 25 MCG (1000 UT) CAPS, Take 1 capsule by mouth daily., Disp: , Rfl:    ezetimibe (ZETIA) 10 MG tablet, Take 1 tablet (10 mg total) by mouth daily., Disp: 90 tablet, Rfl: 3   famotidine (PEPCID) 40 MG tablet, Take 1 tablet (40 mg total) by mouth daily., Disp: 90 tablet, Rfl: 1   FIBER PO, Take 1 capsule by mouth 2 (two) times  daily., Disp: , Rfl:    fluticasone (FLONASE) 50 MCG/ACT nasal spray, Place 2 sprays into both nostrils daily. Use for 4-6 weeks then stop and use seasonally or as needed., Disp: 16 g, Rfl: 3   gabapentin (NEURONTIN) 300 MG capsule, Take 1 capsule (300 mg total) by mouth 3 (three) times daily., Disp: 270 capsule, Rfl: 1   ibandronate (BONIVA) 150 MG tablet, Take 150 mg by mouth every 30 (thirty) days., Disp: , Rfl:    ipratropium (ATROVENT) 0.06 % nasal spray, Place 2 sprays into both nostrils 4 (four) times daily. For up to 5-7 days then stop., Disp: 15 mL, Rfl: 0   levothyroxine (SYNTHROID) 25 MCG tablet, Take 1 tablet (25 mcg total) by mouth daily., Disp: 90 tablet, Rfl: 1   loratadine (CLARITIN) 10 MG tablet, Take 10 mg by mouth every morning., Disp: , Rfl:    Magnesium 200 MG TABS, Take 200 mg by mouth daily., Disp: , Rfl:    methocarbamol (ROBAXIN) 500 MG tablet, Take 1 tablet (500 mg total) by mouth every 6 (six) hours as needed for muscle spasms., Disp: 120 tablet, Rfl: 0   niacin (NIASPAN) 500 MG CR tablet, Take 500 mg by mouth at bedtime., Disp: , Rfl:    predniSONE (DELTASONE) 5 MG tablet, Take 5 mg by mouth daily., Disp: , Rfl:    rosuvastatin (CRESTOR) 40 MG tablet, Take 1 tablet (40 mg total) by mouth daily., Disp: 90 tablet, Rfl: 3   traMADol (ULTRAM) 50 MG tablet, TAKE 1 TABLET BY MOUTH EVERY 6 HOURS AS NEEDED FOR MODERATE PAIN, Disp: 120 tablet, Rfl: 2   benzonatate (TESSALON) 100 MG capsule, Take 1 capsule (100 mg total) by mouth 3 (three) times daily as needed for cough. (Patient not taking: Reported on 07/09/2023), Disp: 30 capsule, Rfl: 0   levofloxacin (LEVAQUIN) 500 MG tablet, Take 1 tablet (500 mg total) by mouth daily. (Patient not taking: Reported on 07/09/2023),  Disp: 7 tablet, Rfl: 0 There are no discontinued medications.  Next Medicare Wellness Visit in 12+ months  Domingo Friend, DO [facility] 07/09/2023, 9:27 AM

## 2023-07-16 DIAGNOSIS — H524 Presbyopia: Secondary | ICD-10-CM | POA: Diagnosis not present

## 2023-08-06 DIAGNOSIS — K080 Exfoliation of teeth due to systemic causes: Secondary | ICD-10-CM | POA: Diagnosis not present

## 2023-08-28 ENCOUNTER — Other Ambulatory Visit: Payer: Self-pay

## 2023-08-28 DIAGNOSIS — M5412 Radiculopathy, cervical region: Secondary | ICD-10-CM

## 2023-08-29 ENCOUNTER — Ambulatory Visit
Admission: RE | Admit: 2023-08-29 | Discharge: 2023-08-29 | Disposition: A | Discharge status: Home / Self Care | Attending: Neurosurgery | Admitting: Neurosurgery

## 2023-08-29 ENCOUNTER — Ambulatory Visit: Payer: Managed Care, Other (non HMO) | Admitting: Neurosurgery

## 2023-08-29 ENCOUNTER — Ambulatory Visit
Admission: RE | Admit: 2023-08-29 | Discharge: 2023-08-29 | Disposition: A | Discharge status: Home / Self Care | Source: Ambulatory Visit | Attending: Neurosurgery | Admitting: Neurosurgery

## 2023-08-29 VITALS — BP 140/82 | Ht 66.0 in | Wt 198.4 lb

## 2023-08-29 DIAGNOSIS — M4312 Spondylolisthesis, cervical region: Secondary | ICD-10-CM | POA: Diagnosis not present

## 2023-08-29 DIAGNOSIS — M4803 Spinal stenosis, cervicothoracic region: Secondary | ICD-10-CM | POA: Diagnosis not present

## 2023-08-29 DIAGNOSIS — M5412 Radiculopathy, cervical region: Secondary | ICD-10-CM

## 2023-08-29 DIAGNOSIS — Z09 Encounter for follow-up examination after completed treatment for conditions other than malignant neoplasm: Secondary | ICD-10-CM

## 2023-08-29 DIAGNOSIS — M4316 Spondylolisthesis, lumbar region: Secondary | ICD-10-CM

## 2023-08-29 DIAGNOSIS — Z981 Arthrodesis status: Secondary | ICD-10-CM | POA: Diagnosis not present

## 2023-08-29 NOTE — Progress Notes (Signed)
   REFERRING PHYSICIAN:  Bryant Capron [address] [place],  [place] [address]  DOS: 09/05/22 ACDF C5-C7  HISTORY OF PRESENT ILLNESS: Adrian Zavala is status post  ACDF C5-C7.  He is doing well with some neck pain.   His back is currently ok.  PHYSICAL EXAMINATION:  NEUROLOGICAL:  General: In no acute distress.   Awake, alert, oriented to person, place, and time.  Pupils equal round and reactive to light.  Facial tone is symmetric.    Strength: Side Biceps Triceps Deltoid Interossei Grip Wrist Ext. Wrist Flex.  R 5 5 5 5 5 5 5   L 5 5 5 5 5 5 5    Incision c/d/I  Imaging:  X-rays on October 16, 2022 reviewed.  There is evidence of settling at C5-6.  At C6-7, there is evidence of subsidence with a new kyphosis between C6 and C7.  X-rays from November 27, 2022, December 5, and June 5 are stable.   Assessment / Plan: Estevon Fluke is doing well s/p above surgery.  Clinically, he is doing quite well. He appears fused at C5/6 and almost fused at C6/7.  He is clinically stable.  He will continue his current medical regimen to control his neck discomfort.  I will see him back on an as-needed basis.  We did discuss his L4-5 anterolisthesis.  If he develops worsening back or leg pain, I am happy to reevaluate him for that.  I spent a total of 10 minutes in this patient's care today. This time was spent reviewing pertinent records including imaging studies, obtaining and confirming history, performing a directed evaluation, formulating and discussing my recommendations, and documenting the visit within the medical record.    Jodeen Munch Dept of Neurosurgery

## 2023-09-25 ENCOUNTER — Ambulatory Visit (INDEPENDENT_AMBULATORY_CARE_PROVIDER_SITE_OTHER): Payer: Self-pay | Admitting: Family Medicine

## 2023-09-25 ENCOUNTER — Encounter: Payer: Self-pay | Admitting: Family Medicine

## 2023-09-25 VITALS — BP 142/78 | HR 87 | Ht 66.0 in | Wt 196.4 lb

## 2023-09-25 DIAGNOSIS — I10 Essential (primary) hypertension: Secondary | ICD-10-CM

## 2023-09-25 DIAGNOSIS — R7309 Other abnormal glucose: Secondary | ICD-10-CM | POA: Diagnosis not present

## 2023-09-25 DIAGNOSIS — M150 Primary generalized (osteo)arthritis: Secondary | ICD-10-CM

## 2023-09-25 DIAGNOSIS — Z23 Encounter for immunization: Secondary | ICD-10-CM | POA: Diagnosis not present

## 2023-09-25 DIAGNOSIS — G509 Disorder of trigeminal nerve, unspecified: Secondary | ICD-10-CM

## 2023-09-25 DIAGNOSIS — E039 Hypothyroidism, unspecified: Secondary | ICD-10-CM

## 2023-09-25 LAB — POCT GLYCOSYLATED HEMOGLOBIN (HGB A1C): Hemoglobin A1C: 5.8 % — AB (ref 4.0–5.6)

## 2023-09-25 MED ORDER — LEVOTHYROXINE SODIUM 25 MCG PO TABS: 25.0000 ug | ORAL_TABLET | Freq: Every day | ORAL | 1 refills | Status: DC

## 2023-09-25 NOTE — Progress Notes (Signed)
 Subjective:    Patient ID: Adrian Zavala, male    DOB: May 05, 1950, 73 y.o.   MRN: [id number]  Adrian Zavala is a 73 y.o. male presenting on 09/25/2023 for Hypothyroidism and Osteoarthritis   HPI  Discussed the use of AI scribe software for clinical note transcription with the patient, who gave verbal consent to proceed.  History of Present Illness   Adrian Zavala is a 73 year old male with prediabetes who presents for a follow-up visit.  Glycemic control - Prediabetes with most recent A1c of 5.8 (previously 6.2 in December) - Focused on dietary management to control blood glucose levels  Gastrointestinal symptoms - Diarrhea earlier today, attributed to overeating the previous night - Episodes occur infrequently, approximately every 3-4 months - No other gastrointestinal symptoms  Physical activity - Exercises three times a week at the [facility]  Beverage intake - Drinks diet caffeine-free Coke, decaffeinated tea, and water  Colorectal cancer screening - Last colonoscopy in 2016 - Due for next colonoscopy in 2026  Immunization status - Received Pneumovax vaccine in 2020 - Due for Prevnar 20 vaccine  Medication management - Takes gabapentin , levothyroxine , and tramadol  - Has a backlog of gabapentin  - Recently refilled tramadol  - Due for a refill of levothyroxine        Cervical Spine DDD Followed by [facility] Neurosurgery Dr Clois S/p C5-7 anterior cervical discectomy and fusion on 09/05/22 Controlled mostly on Tramadol  AS NEEDED   History R Hip S/p fracture repair, and periprosthetic fracture   Health Maintenance: Last Colonoscopy 10/25/14 - negative, repeat 2026.   Prevnar-20 vaccine today  Future Shingrix     07/09/2023    8:59 AM 03/25/2023    3:33 PM 02/19/2023    4:33 PM  Depression screen PHQ 2/9  Decreased Interest 0 0 0  Down, Depressed, Hopeless 0 0 0  PHQ - 2 Score 0 0 0  Altered sleeping 1    Tired, decreased energy 2    Change in appetite 0     Feeling bad or failure about yourself  0    Trouble concentrating 2    Moving slowly or fidgety/restless 0    Suicidal thoughts 0    PHQ-9 Score 5    Difficult doing work/chores Not difficult at all         07/09/2023    8:59 AM 03/25/2023    3:33 PM 02/19/2023    4:33 PM 09/18/2022   11:33 AM  GAD 7 : Generalized Anxiety Score  Nervous, Anxious, on Edge 0 0 0 0  Control/stop worrying 0 0 0 0  Worry too much - different things 0 0  0  Trouble relaxing 1 0 0 0  Restless 0 0 0 0  Easily annoyed or irritable 0 0 0 0  Afraid - awful might happen 0 0 0 0  Total GAD 7 Score 1 0  0  Anxiety Difficulty Not difficult at all  Not difficult at all Not difficult at all    Social History   Tobacco Use   Smoking status: Former    Current packs/day: 0.00    Average packs/day: 1 pack/day for 30.0 years (30.0 ttl pk-yrs)    Types: Cigarettes    Start date: 08/25/1966    Quit date: 08/24/1996    Years since quitting: 27.1   Smokeless tobacco: Never  Vaping Use   Vaping status: Never Used  Substance Use Topics   Alcohol use: Yes    Alcohol/week: 3.0 standard drinks of alcohol  Types: 3 Cans of beer per week    Comment: occassional   Drug use: No    Review of Systems Per HPI unless specifically indicated above     Objective:    BP (!) 142/78 (BP Location: Right Arm, Patient Position: Sitting, Cuff Size: Normal)   Pulse 87   Ht 5' 6 (1.676 m)   Wt 196 lb 6.4 oz (89.1 kg)   SpO2 94%   BMI 31.70 kg/m   Wt Readings from Last 3 Encounters:  09/25/23 196 lb 6.4 oz (89.1 kg)  08/29/23 198 lb 6.4 oz (90 kg)  07/09/23 194 lb (88 kg)    Physical Exam Vitals and nursing note reviewed.  Constitutional:      General: He is not in acute distress.    Appearance: He is well-developed. He is not diaphoretic.     Comments: Well-appearing, comfortable, cooperative  HENT:     Head: Normocephalic and atraumatic.  Eyes:     General:        Right eye: No discharge.        Left eye: No  discharge.     Conjunctiva/sclera: Conjunctivae normal.  Neck:     Thyroid: No thyromegaly.  Cardiovascular:     Rate and Rhythm: Normal rate and regular rhythm.     Pulses: Normal pulses.     Heart sounds: Normal heart sounds. No murmur heard. Pulmonary:     Effort: Pulmonary effort is normal. No respiratory distress.     Breath sounds: Normal breath sounds. No wheezing or rales.  Musculoskeletal:        General: Normal range of motion.     Cervical back: Normal range of motion and neck supple.  Lymphadenopathy:     Cervical: No cervical adenopathy.  Skin:    General: Skin is warm and dry.     Findings: No erythema or rash.  Neurological:     Mental Status: He is alert and oriented to person, place, and time. Mental status is at baseline.  Psychiatric:        Behavior: Behavior normal.     Comments: Well groomed, good eye contact, normal speech and thoughts     Recent Labs    03/13/23 0825 09/25/23 1041  HGBA1C 6.2* 5.8*    Results for orders placed or performed in visit on 09/25/23  POCT HgB A1C   Collection Time: 09/25/23 10:41 AM  Result Value Ref Range   Hemoglobin A1C 5.8 (A) 4.0 - 5.6 %   HbA1c POC (<> result, manual entry)     HbA1c, POC (prediabetic range)     HbA1c, POC (controlled diabetic range)        Assessment & Plan:   Problem List Items Addressed This Visit     Elevated hemoglobin A1c - Primary   Relevant Orders   POCT HgB A1C (Completed)   Essential hypertension   Hypothyroidism   Relevant Medications   levothyroxine  (SYNTHROID ) 25 MCG tablet   Osteoarthritis of multiple joints   Other Visit Diagnoses       Need for Streptococcus pneumoniae vaccination       Relevant Orders   Pneumococcal conjugate vaccine 20-valent (Completed)     Abnormality of trigeminal nerve             Prediabetes A1c improved from 6.2 to 5.8, indicating good control and reduced diabetes risk. Lifestyle changes crucial for maintaining A1c. - Continue annual  A1c monitoring. - Encourage continued dietary management and regular exercise.  Diarrhea Infrequent episodes, most recent due to overeating. Discussed potential dietary triggers. Infrequency suggests low concern. - Monitor for potential dietary triggers. - Advise on dietary modifications if patterns become more frequent.  Medication Management Backlog of gabapentin , no refill needed. Refill levothyroxine . Tramadol  recently refilled, no new prescription needed. - Refill levothyroxine  for 90 days. - Monitor medication needs and refill as necessary.  General Health Maintenance Due for colonoscopy in 2026. Discussed GI provider changes. Due for Prevnar 20, explained benefits, agreed to receive. - Administer Prevnar 20 vaccination today. - Plan for colonoscopy in 2026, considering new provider options.         Orders Placed This Encounter  Procedures   Pneumococcal conjugate vaccine 20-valent   POCT HgB A1C    Meds ordered this encounter  Medications   levothyroxine  (SYNTHROID ) 25 MCG tablet    Sig: Take 1 tablet (25 mcg total) by mouth daily.    Dispense:  90 tablet    Refill:  1    Follow up plan: Return for 6 month Annual Physical.  [facility] for future, can release when requested.  Marsa Officer, DO [facility] 09/25/2023, 10:39 AM

## 2023-09-25 NOTE — Patient Instructions (Addendum)
 Thank you for coming to the office today.  Prevnar-20 vaccine today.  Refilled Levothyroxine  today  Recent Labs    03/13/23 0825 09/25/23 1041  HGBA1C 6.2* 5.8*    DUE for FASTING BLOOD WORK (no food or drink after midnight before the lab appointment, only water or coffee without cream/sugar on the morning of)  [facility] orders will be ready. Please contact us  sand we can print the orders.   Please schedule a Follow-up Appointment to: Return for 6 month Annual Physical.  If you have any other questions or concerns, please feel free to call the office or send a message through MyChart. You may also schedule an earlier appointment if necessary.  Additionally, you may be receiving a survey about your experience at our office within a few days to 1 week by e-mail or mail. We value your feedback.  Marsa Officer, DO [facility], [place]

## 2023-10-23 ENCOUNTER — Other Ambulatory Visit: Payer: Self-pay | Admitting: Family Medicine

## 2023-10-23 DIAGNOSIS — M5030 Other cervical disc degeneration, unspecified cervical region: Secondary | ICD-10-CM

## 2023-10-23 DIAGNOSIS — M150 Primary generalized (osteo)arthritis: Secondary | ICD-10-CM

## 2023-10-24 NOTE — Telephone Encounter (Signed)
 Requested medications are due for refill today.  yes  Requested medications are on the active medications list.  yes  Last refill. 06/11/2023 #120 2 rf  Future visit scheduled.   yes  Notes to clinic.  Refill not delegated.    Requested Prescriptions  Pending Prescriptions Disp Refills   traMADol  (ULTRAM ) 50 MG tablet [Pharmacy Med Name: TRAMADOL  HCL 50MG  TABLET] 120 tablet     Sig: TAKE 1 TABLET BY MOUTH EVERY 6 HOURS AS NEEDED FOR MODERATE PAIN     Not Delegated - Analgesics:  Opioid Agonists Failed - 10/24/2023  9:26 AM      Failed - This refill cannot be delegated      Failed - Urine Drug Screen completed in last 360 days      Passed - Valid encounter within last 3 months    Recent Outpatient Visits           4 weeks ago Elevated hemoglobin A1c   [place] [facility] [place], Marsa PARAS, DO   3 months ago Medicare annual wellness visit, initial   [place] [facility] [place], Marsa PARAS, DO   5 months ago Acute non-recurrent frontal sinusitis   [place] [facility] [place] Marsa PARAS, DO   5 months ago Acute non-recurrent frontal sinusitis   [place] [facility] [place], Marsa PARAS, [place]

## 2023-11-11 DIAGNOSIS — J329 Chronic sinusitis, unspecified: Secondary | ICD-10-CM | POA: Diagnosis not present

## 2023-11-11 DIAGNOSIS — G500 Trigeminal neuralgia: Secondary | ICD-10-CM | POA: Diagnosis not present

## 2023-11-11 DIAGNOSIS — Z1331 Encounter for screening for depression: Secondary | ICD-10-CM | POA: Diagnosis not present

## 2023-11-28 ENCOUNTER — Other Ambulatory Visit: Payer: Self-pay | Admitting: Family Medicine

## 2023-11-28 DIAGNOSIS — K219 Gastro-esophageal reflux disease without esophagitis: Secondary | ICD-10-CM

## 2023-11-28 NOTE — Telephone Encounter (Signed)
 Requested Prescriptions  Pending Prescriptions Disp Refills   famotidine  (PEPCID ) 40 MG tablet [Pharmacy Med Name: FAMOTIDINE  40 MG TABLET] 90 tablet 1    Sig: TAKE 1 TABLET BY MOUTH DAILY     Gastroenterology:  H2 Antagonists Passed - 11/28/2023  2:22 PM      Passed - Valid encounter within last 12 months    Recent Outpatient Visits           2 months ago Elevated hemoglobin A1c   [place] [facility] [place], Marsa PARAS, DO   4 months ago Medicare annual wellness visit, initial   [place] [facility] [place], Marsa PARAS, DO   6 months ago Acute non-recurrent frontal sinusitis   [place] [facility] [place] Marsa PARAS, DO   7 months ago Acute non-recurrent frontal sinusitis   [place] [facility] [place], Marsa PARAS, [place]

## 2024-01-19 ENCOUNTER — Other Ambulatory Visit: Payer: Self-pay | Admitting: Family Medicine

## 2024-01-19 DIAGNOSIS — M5030 Other cervical disc degeneration, unspecified cervical region: Secondary | ICD-10-CM

## 2024-01-19 DIAGNOSIS — M150 Primary generalized (osteo)arthritis: Secondary | ICD-10-CM

## 2024-01-21 NOTE — Telephone Encounter (Signed)
 Requested medication (s) are due for refill today -yes  Requested medication (s) are on the active medication list -yes  Future visit scheduled -yes  Last refill: 10/24/23 #120 2RF  Notes to clinic: non delegated Rx  Requested Prescriptions  Pending Prescriptions Disp Refills   traMADol  (ULTRAM ) 50 MG tablet [Pharmacy Med Name: traMADol  HCL 50MG  TABLET] 120 tablet     Sig: TAKE 1 TABLET BY MOUTH EVERY 6 HOURS AS NEEDED FOR MODERATE PAIN     Not Delegated - Analgesics:  Opioid Agonists Failed - 01/21/2024 10:42 AM      Failed - This refill cannot be delegated      Failed - Urine Drug Screen completed in last 360 days      Failed - Valid encounter within last 3 months    Recent Outpatient Visits           3 months ago Elevated hemoglobin A1c   [place] [facility] [place] Marsa PARAS, DO   6 months ago Medicare annual wellness visit, initial   [place] [facility] [place], Marsa PARAS, DO   8 months ago Acute non-recurrent frontal sinusitis   [facility] [place], Marsa PARAS, DO   8 months ago Acute non-recurrent frontal sinusitis   [place] [facility] [place], Marsa PARAS, DO                 Requested Prescriptions  Pending Prescriptions Disp Refills   traMADol  (ULTRAM ) 50 MG tablet [Pharmacy Med Name: traMADol  HCL 50MG  TABLET] 120 tablet     Sig: TAKE 1 TABLET BY MOUTH EVERY 6 HOURS AS NEEDED FOR MODERATE PAIN     Not Delegated - Analgesics:  Opioid Agonists Failed - 01/21/2024 10:42 AM      Failed - This refill cannot be delegated      Failed - Urine Drug Screen completed in last 360 days      Failed - Valid encounter within last 3 months    Recent Outpatient Visits           3 months ago Elevated hemoglobin A1c   [place] [facility] [place] Marsa PARAS, DO   6 months ago Medicare annual wellness visit, initial   [facility] [place], Marsa PARAS, DO   8 months ago Acute non-recurrent frontal sinusitis   [place] [facility] [place] Marsa PARAS, DO   8 months ago Acute non-recurrent frontal sinusitis   [place] [facility] [place], Marsa PARAS, [place]

## 2024-03-31 ENCOUNTER — Ambulatory Visit: Admitting: Family Medicine

## 2024-03-31 ENCOUNTER — Encounter: Payer: Self-pay | Admitting: Family Medicine

## 2024-03-31 VITALS — BP 134/78 | HR 85 | Ht 66.0 in | Wt 193.2 lb

## 2024-03-31 DIAGNOSIS — K582 Mixed irritable bowel syndrome: Secondary | ICD-10-CM

## 2024-03-31 DIAGNOSIS — I10 Essential (primary) hypertension: Secondary | ICD-10-CM | POA: Diagnosis not present

## 2024-03-31 DIAGNOSIS — M3130 Wegener's granulomatosis without renal involvement: Secondary | ICD-10-CM | POA: Diagnosis not present

## 2024-03-31 DIAGNOSIS — Z0000 Encounter for general adult medical examination without abnormal findings: Secondary | ICD-10-CM | POA: Diagnosis not present

## 2024-03-31 DIAGNOSIS — E782 Mixed hyperlipidemia: Secondary | ICD-10-CM | POA: Diagnosis not present

## 2024-03-31 DIAGNOSIS — E039 Hypothyroidism, unspecified: Secondary | ICD-10-CM

## 2024-03-31 DIAGNOSIS — R7309 Other abnormal glucose: Secondary | ICD-10-CM | POA: Diagnosis not present

## 2024-03-31 DIAGNOSIS — K5090 Crohn's disease, unspecified, without complications: Secondary | ICD-10-CM

## 2024-03-31 DIAGNOSIS — Z23 Encounter for immunization: Secondary | ICD-10-CM

## 2024-03-31 DIAGNOSIS — I5032 Chronic diastolic (congestive) heart failure: Secondary | ICD-10-CM

## 2024-03-31 MED ORDER — LEVOTHYROXINE SODIUM 25 MCG PO TABS: 25.0000 ug | ORAL_TABLET | Freq: Every day | ORAL | 3 refills | Status: AC

## 2024-03-31 NOTE — Progress Notes (Signed)
 "  Subjective:    Patient ID: Adrian Zavala, male    DOB: [birth year], 74 y.o.   MRN: [id number]  Adrian Zavala is a 74 y.o. male presenting on 03/31/2024 for Annual Exam   HPI  Discussed the use of AI scribe software for clinical note transcription with the patient, who gave verbal consent to proceed.  History of Present Illness   Adrian Zavala is a 74 year old male who presents for an annual physical exam.    Medication management - Currently taking prednisone , with eight tablets remaining and on auto-refill - Takes levothyroxine , due for refill - Takes gabapentin , sufficient supply remaining - Regularly takes vitamin B12 supplement     Pre-Diabetes - Prediabetes with most recent A1c of 5.9, done 3 months ago, no new concerns - Focused on dietary management to control blood glucose levels   Gastrointestinal symptoms IBS / Crohn's in Remission Autoimmune history and Crohn's disease Limited GI follow-up but will return / re-schedule Due for Colonoscopy later in 2026 - Diarrhea earlier today, attributed to overeating the previous night - Episodes occur infrequently, approximately every 3-4 months - No other gastrointestinal symptoms  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 Controlled without recent flare, has been stable On chronic low dose prednisone  5mg  daily   Physical activity - Exercises three times a week at the [facility]     Cervical Spine DDD Followed by [facility] Neurosurgery Dr Clois S/p C5-7 anterior cervical discectomy and fusion on 09/05/22 Controlled mostly on Tramadol  AS NEEDED   History R Hip S/p fracture repair, and periprosthetic fracture 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, hands, hips) Dr Kubinski [facility] He had x-rays C/T spine, showed OA/DJD  He continues to take Tramadol  up to 3 times daily regularly PRN for pain with good results, needs refill - He has chronic back pain with DJD bone  spurs and has curvature of spine, worse with prolonged sitting at work, causing worse back pain, followed by Rheumatology - Taking Tylenol  Arthritis 650mg  x 1 per dose in afternoon and evening with some relief - On Methocarbamol  AS NEEDED  - On Gabapentin    Hyperlipidemia CAD HTN Ascending aortic aneurysm - Followed by Cardiology - He is on Zetia  and Crestor  40mg  - Previously monitored imaging on CT - No longer on anti hypertensive therapy     Health Maintenance: Last Colonoscopy 10/25/14 - negative, repeat 2026.   Prevnar-20 vaccine completed 09/2023   Future Shingrix at the pharmacy  Flu Shot today      03/31/2024    1:27 PM 07/09/2023    8:59 AM 03/25/2023    3:33 PM  Depression screen PHQ 2/9  Decreased Interest 0 0 0  Down, Depressed, Hopeless 0 0 0  PHQ - 2 Score 0 0 0  Altered sleeping  1   Tired, decreased energy  2   Change in appetite  0   Feeling bad or failure about yourself   0   Trouble concentrating  2   Moving slowly or fidgety/restless  0   Suicidal thoughts  0   PHQ-9 Score  5    Difficult doing work/chores  Not difficult at all      Data saved with a previous flowsheet row definition       07/09/2023    8:59 AM 03/25/2023    3:33 PM 02/19/2023    4:33 PM 09/18/2022   11:33 AM  GAD 7 : Generalized Anxiety Score  Nervous, Anxious, on Edge  0 0 0 0  Control/stop worrying 0 0 0 0  Worry too much - different things 0 0  0  Trouble relaxing 1 0 0 0  Restless 0 0 0 0  Easily annoyed or irritable 0 0 0 0  Afraid - awful might happen 0 0 0 0  Total GAD 7 Score 1 0  0  Anxiety Difficulty Not difficult at all  Not difficult at all Not difficult at all     Past Medical History:  Diagnosis Date   Anemia    Aortic atherosclerosis    Aortic stenosis 06/10/2017   a.) TTE 06/10/2017: EF 60-65%; very mild AS with MPG 5 mmHG; AVA (VTI) = 2.22 cm   Arthritis    Spine, Left Hip, Hands   Ascending aorta dilatation 05/07/2016   a.) cCTA 05/07/2016:  measured 41 mm; b.) TTE 06/10/2017: measured 39 mm; c.) cCTA 08/04/2020: measured 39 mm; d.) TTE 08/15/2020: measured 38 mm   BPH (benign prostatic hyperplasia)    Cervical radiculopathy    Coronary artery disease    Crohn's disease (HCC)    Cushing syndrome    a.) exogenous 2/2 prolonged corticosteroid use   Diastolic dysfunction 08/15/2020   a.) TTE 08/15/2020: EF 60-65%, mild MR, mod AoV sclerosis, G1DD   Fatty liver disease, nonalcoholic    Gangrene of gallbladder    GERD (gastroesophageal reflux disease)    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HCC)    Heart murmur    Hemorrhoid    Hypercholesteremia    Hypertension    Hypothyroidism    IBS (irritable bowel syndrome)    Inflammatory eye disease    Long term current use of systemic steroids    a.) low dose prednisone    Long-term current use of bisphosphonate    a.) ibandronate   Lower extremity weakness    a.) s/p chemo for Wegener's   Lumbar stenosis    Lung disorder    S/P chemo for Wegener's   Osteopenia    Osteoporosis    Pre-diabetes    RBBB (right bundle branch block)    Seasonal allergies    Spinal stenosis in cervical region    Spondylolisthesis of cervical region    Wegener's granulomatosis    a.) s/p Tx with cyclophosphamide + MTX; ongoing treatment with chronic corticosteroids   Past Surgical History:  Procedure Laterality Date   ANTERIOR CERVICAL DECOMP/DISCECTOMY FUSION N/A 09/05/2022   Procedure: C5-7 ANTERIOR CERVICAL DISCECTOMY AND FUSION (HEDRON);  Surgeon: Clois Fret, MD;  Location: [facility] ORS;  Service: Neurosurgery;  Laterality: N/A;   CATARACT EXTRACTION W/ INTRAOCULAR LENS  IMPLANT, BILATERAL Bilateral    CHOLECYSTECTOMY     with exploration; has metal sutures   COLONOSCOPY N/A 10/25/2014   Procedure: COLONOSCOPY;  Surgeon: Rogelia Copping, MD;  Location[facility];  Service: Gastroenterology;  Laterality: N/A;   FEMORAL REVISION Right 10/25/2021   Procedure: REVISION FEMORAL STEM  RIGHT HIP;  Surgeon: Edie Norleen PARAS, MD;  Location: [facility] ORS;  Service: Orthopedics;  Laterality: Right;   LASER RESURFACING Bilateral    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   Procedure: TOTAL HIP ARTHROPLASTY;  Surgeon: Edie Norleen PARAS, MD;  Location: [facility] ORS;  Service: Orthopedics;  Laterality: Right;   TREATMENT FISTULA ANAL     closure with rectal advancement flap   VASECTOMY     Social History   Socioeconomic History   Marital status: Married    Spouse name: Velton Roselle   Number of children: 2  Years of education: Not on file   Highest education level: Bachelor's degree (e.g., BA, AB, BS)  Occupational History   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[employer], Educational Psychologist)  Tobacco Use   Smoking status: Former    Current packs/day: 0.00    Average packs/day: 1 pack/day for 30.0 years (30.0 ttl pk-yrs)    Types: Cigarettes    Start date: 08/25/1966    Quit date: 08/24/1996    Years since quitting: 27.6   Smokeless tobacco: Never  Vaping Use   Vaping status: Never Used  Substance and Sexual Activity   Alcohol use: Yes    Alcohol/week: 3.0 standard drinks of alcohol    Types: 3 Cans of beer per week    Comment: occassional   Drug use: No   Sexual activity: Not on file  Other Topics Concern   Not on file  Social History Narrative   Not on file   Social Drivers of Health   Tobacco Use: Medium Risk (03/31/2024)   Patient History    Smoking Tobacco Use: Former    Smokeless Tobacco Use: Never    Passive Exposure: Not on file  Financial Resource Strain: Medium Risk (03/27/2024)   Overall Financial Resource Strain (CARDIA)    Difficulty of Paying Living Expenses: Somewhat hard  Food Insecurity: No Food Insecurity (03/27/2024)   Epic    Worried About Radiation Protection Practitioner of Food in the Last Year: Never true    Ran Out of Food in the Last Year: Never true  Transportation Needs: No Transportation Needs (03/27/2024)   Epic    Lack of Transportation (Medical): No    Lack of Transportation  (Non-Medical): No  Physical Activity: Sufficiently Active (09/25/2023)   Exercise Vital Sign    Days of Exercise per Week: 3 days    Minutes of Exercise per Session: 60 min  Stress: No Stress Concern Present (09/24/2023)   [employer] of Occupational Health - Occupational Stress Questionnaire    Feeling of Stress: Not at all  Social Connections: Unknown (03/27/2024)   Social Connection and Isolation Panel    Frequency of Communication with Friends and Family: Twice a week    Frequency of Social Gatherings with Friends and Family: Once a week    Attends Religious Services: More than 4 times per year    Active Member of [employer] or Organizations: Not on file    Attends Banker Meetings: Not on file    Marital Status: Married  Intimate Partner Violence: Not At Risk (09/05/2022)   Humiliation, Afraid, Rape, and Kick questionnaire    Fear of Current or Ex-Partner: No    Emotionally Abused: No    Physically Abused: No    Sexually Abused: No  Depression (PHQ2-9): Low Risk (03/31/2024)   Depression (PHQ2-9)    PHQ-2 Score: 0  Alcohol Screen: Low Risk (03/27/2024)   Alcohol Screen    Last Alcohol Screening Score (AUDIT): 1  Housing: Low Risk (03/27/2024)   Epic    Unable to Pay for Housing in the Last Year: No    Number of Times Moved in the Last Year: 0    Homeless in the Last Year: No  Utilities: Not At Risk (09/25/2023)   Epic    Threatened with loss of utilities: No  Health Literacy: Adequate Health Literacy (02/19/2023)   B1300 Health Literacy    Frequency of need for help with medical instructions: Never   Family History  Family history unknown: Yes   Current Outpatient Medications on File Prior  to Visit  Medication Sig   acetaminophen  (TYLENOL ) 650 MG CR tablet Take 650 mg by mouth every 8 (eight) hours as needed for pain.   calcium  carbonate (OS-CAL) 600 MG TABS tablet Take 600 mg by mouth daily. AM   Cholecalciferol  (VITAMIN D3) 25 MCG (1000 UT) CAPS Take 1 capsule by  mouth daily.   ezetimibe  (ZETIA ) 10 MG tablet Take 1 tablet (10 mg total) by mouth daily.   famotidine  (PEPCID ) 40 MG tablet TAKE 1 TABLET BY MOUTH DAILY   FIBER PO Take 1 capsule by mouth 2 (two) times daily.   fluticasone  (FLONASE ) 50 MCG/ACT nasal spray Place 2 sprays into both nostrils daily. Use for 4-6 weeks then stop and use seasonally or as needed.   gabapentin  (NEURONTIN ) 300 MG capsule Take 1 capsule (300 mg total) by mouth 3 (three) times daily.   ibandronate (BONIVA) 150 MG tablet Take 150 mg by mouth every 30 (thirty) days.   ipratropium (ATROVENT ) 0.06 % nasal spray Place 2 sprays into both nostrils 4 (four) times daily. For up to 5-7 days then stop.   loratadine  (CLARITIN ) 10 MG tablet Take 10 mg by mouth every morning.   Magnesium  200 MG TABS Take 200 mg by mouth daily.   methocarbamol  (ROBAXIN ) 500 MG tablet Take 1 tablet (500 mg total) by mouth every 6 (six) hours as needed for muscle spasms.   niacin  (NIASPAN ) 500 MG CR tablet Take 500 mg by mouth at bedtime.   predniSONE  (DELTASONE ) 5 MG tablet Take 5 mg by mouth daily.   rosuvastatin  (CRESTOR ) 40 MG tablet Take 1 tablet (40 mg total) by mouth daily.   traMADol  (ULTRAM ) 50 MG tablet TAKE 1 TABLET BY MOUTH EVERY 6 HOURS AS NEEDED FOR MODERATE PAIN   No current facility-administered medications on file prior to visit.    Review of Systems  Constitutional:  Negative for activity change, appetite change, chills, diaphoresis, fatigue and fever.  HENT:  Negative for congestion and hearing loss.   Eyes:  Negative for visual disturbance.  Respiratory:  Negative for cough, chest tightness, shortness of breath and wheezing.   Cardiovascular:  Negative for chest pain, palpitations and leg swelling.  Gastrointestinal:  Negative for abdominal pain, constipation, diarrhea, nausea and vomiting.  Genitourinary:  Negative for dysuria, frequency and hematuria.  Musculoskeletal:  Negative for arthralgias and neck pain.  Skin:  Negative  for rash.  Neurological:  Negative for dizziness, weakness, light-headedness, numbness and headaches.  Hematological:  Negative for adenopathy.  Psychiatric/Behavioral:  Negative for behavioral problems, dysphoric mood and sleep disturbance.    Per HPI unless specifically indicated above     Objective:    BP 134/78 (BP Location: Right Arm, Patient Position: Sitting, Cuff Size: Normal)   Pulse 85   Ht 5' 6 (1.676 m)   Wt 193 lb 4 oz (87.7 kg)   SpO2 96%   BMI 31.19 kg/m   Wt Readings from Last 3 Encounters:  03/31/24 193 lb 4 oz (87.7 kg)  09/25/23 196 lb 6.4 oz (89.1 kg)  08/29/23 198 lb 6.4 oz (90 kg)    Physical Exam Vitals and nursing note reviewed.  Constitutional:      General: He is not in acute distress.    Appearance: He is well-developed. He is not diaphoretic.     Comments: Well-appearing, comfortable, cooperative  HENT:     Head: Normocephalic and atraumatic.  Eyes:     General:        Right eye:  No discharge.        Left eye: No discharge.     Conjunctiva/sclera: Conjunctivae normal.     Pupils: Pupils are equal, round, and reactive to light.  Neck:     Thyroid: No thyromegaly.     Vascular: No carotid bruit.  Cardiovascular:     Rate and Rhythm: Normal rate and regular rhythm.     Pulses: Normal pulses.     Heart sounds: Normal heart sounds. No murmur heard. Pulmonary:     Effort: Pulmonary effort is normal. No respiratory distress.     Breath sounds: Normal breath sounds. No wheezing or rales.  Abdominal:     General: Bowel sounds are normal. There is no distension.     Palpations: Abdomen is soft. There is no mass.     Tenderness: There is no abdominal tenderness.  Musculoskeletal:        General: No tenderness. Normal range of motion.     Cervical back: Normal range of motion and neck supple.     Right lower leg: No edema.     Left lower leg: No edema.     Comments: Upper / Lower Extremities: - Normal muscle tone, strength bilateral upper  extremities 5/5, lower extremities 5/5  Lymphadenopathy:     Cervical: No cervical adenopathy.  Skin:    General: Skin is warm and dry.     Findings: No erythema or rash.  Neurological:     Mental Status: He is alert and oriented to person, place, and time.     Comments: Distal sensation intact to light touch all extremities  Psychiatric:        Mood and Affect: Mood normal.        Behavior: Behavior normal.        Thought Content: Thought content normal.     Comments: Well groomed, good eye contact, normal speech and thoughts     Results for orders placed or performed in visit on 09/25/23  POCT HgB A1C   Collection Time: 09/25/23 10:41 AM  Result Value Ref Range   Hemoglobin A1C 5.8 (A) 4.0 - 5.6 %   HbA1c POC (<> result, manual entry)     HbA1c, POC (prediabetic range)     HbA1c, POC (controlled diabetic range)        Assessment & Plan:   Problem List Items Addressed This Visit     Chronic diastolic heart failure (HCC)   Crohn's disease in remission (HCC)   Elevated hemoglobin A1c   Relevant Orders   Hemoglobin A1c   Essential hypertension   Relevant Orders   CBC with Differential/Platelet   Comprehensive metabolic panel with GFR   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HCC)   HLD (hyperlipidemia)   Relevant Orders   Lipid panel   Comprehensive metabolic panel with GFR   Hypothyroidism   Relevant Medications   levothyroxine  (SYNTHROID ) 25 MCG tablet   Other Relevant Orders   TSH   T4, free   Irritable bowel syndrome (IBS)   Other Visit Diagnoses       Annual physical exam    -  Primary   Relevant Orders   Lipid panel   Hemoglobin A1c   CBC with Differential/Platelet   PSA   TSH   Comprehensive metabolic panel with GFR     Flu vaccine need       Relevant Orders   Flu vaccine HIGH DOSE PF(Fluzone Trivalent) (Completed)        Updated Health Maintenance information  Reviewed recent lab results with patient Encouraged improvement to lifestyle with diet  and exercise Goal of weight loss  Adult Wellness Visit Annual physical examination conducted. Blood pressure well-controlled. Weight decreased. A1c indicates well-managed prediabetes. No new concerns identified. - Ordered non-fasting blood panel including cholesterol, PSA, thyroid, and glucose levels. - Scheduled follow-up in six months for routine check-up.  Prediabetes A1c was 5.9, indicating well-managed prediabetes. - Continue monitoring A1c levels. - Recheck A1c in the summer.  Essential hypertension Blood pressure well-controlled at 134/78 mmHg.  Acquired hypothyroidism Levothyroxine  prescription current. 25mcg daily Check labs, TSH T4 follow results - Refilled levothyroxine  prescription for one year.  Mixed hyperlipidemia Previously controlled on Zetia  + Statin Cholesterol levels to be checked. - Ordered cholesterol level as part of non-fasting blood panel.  Crohn's disease in remission Crohn's disease in remission with occasional cramping. Symptoms managed with Imodium as needed. - Continue surveillance of Crohn's disease.  Future Colonoscopy referral next visit in July 2026, due August, 2026 prefer Dr Jinny AGI  Irritable bowel syndrome Symptoms include occasional cramping, managed with Imodium as needed. - Continue current management with Imodium as needed.   Granulomatosis w/ polyangiitis Controlled without complication Followed by Rheumatology On chronic prednisone  daily low dose        Orders Placed This Encounter  Procedures   Flu vaccine HIGH DOSE PF(Fluzone Trivalent)   Lipid panel    Has the patient fasted?:   No   Hemoglobin A1c   CBC with Differential/Platelet   PSA   TSH   Comprehensive metabolic panel with GFR    Has the patient fasted?:   No   T4, free    Meds ordered this encounter  Medications   levothyroxine  (SYNTHROID ) 25 MCG tablet    Sig: Take 1 tablet (25 mcg total) by mouth daily before breakfast.    Dispense:  90 tablet     Refill:  3     Follow up plan: Return for 6 month PreDM A1c, Refer to Colonoscopy Dr Jinny.  Marsa Officer, DO [facility] 03/31/2024, 10:29 AM  "

## 2024-03-31 NOTE — Patient Instructions (Addendum)
 Thank you for coming to the office today.  Shingles vaccine at the pharmacy Shingrix 1 dose with repeat dose 2-6 months apart  Flu Shot  Future Colonoscopy referral next visit in July 2026, due August, 2026 prefer Dr Jinny AGI  Refilled Levothyroxine  for 1 year  Please schedule a Follow-up Appointment to: Return for 6 month PreDM A1c, Refer to Colonoscopy Dr Jinny.  If you have any other questions or concerns, please feel free to call the office or send a message through MyChart. You may also schedule an earlier appointment if necessary.  Additionally, you may be receiving a survey about your experience at our office within a few days to 1 week by e-mail or mail. We value your feedback.  Marsa Officer, DO [facility], [place]

## 2024-04-01 LAB — COMPREHENSIVE METABOLIC PANEL WITH GFR
AG Ratio: 2.2 (calc) (ref 1.0–2.5)
ALT: 33 U/L (ref 9–46)
AST: 26 U/L (ref 10–35)
Albumin: 4.4 g/dL (ref 3.6–5.1)
Alkaline phosphatase (APISO): 68 U/L (ref 35–144)
BUN/Creatinine Ratio: 23 (calc) — ABNORMAL HIGH (ref 6–22)
BUN: 27 mg/dL — ABNORMAL HIGH (ref 7–25)
CO2: 26 mmol/L (ref 20–32)
Calcium: 9.8 mg/dL (ref 8.6–10.3)
Chloride: 108 mmol/L (ref 98–110)
Creat: 1.17 mg/dL (ref 0.70–1.28)
Globulin: 2 g/dL (ref 1.9–3.7)
Glucose, Bld: 113 mg/dL (ref 65–139)
Potassium: 4.1 mmol/L (ref 3.5–5.3)
Sodium: 141 mmol/L (ref 135–146)
Total Bilirubin: 0.6 mg/dL (ref 0.2–1.2)
Total Protein: 6.4 g/dL (ref 6.1–8.1)
eGFR: 66 mL/min/1.73m2

## 2024-04-01 LAB — CBC WITH DIFFERENTIAL/PLATELET
Absolute Lymphocytes: 1210 {cells}/uL (ref 850–3900)
Absolute Monocytes: 1152 {cells}/uL — ABNORMAL HIGH (ref 200–950)
Basophils Absolute: 144 {cells}/uL (ref 0–200)
Basophils Relative: 1 %
Eosinophils Absolute: 202 {cells}/uL (ref 15–500)
Eosinophils Relative: 1.4 %
HCT: 45.9 % (ref 39.4–51.1)
Hemoglobin: 15.1 g/dL (ref 13.2–17.1)
MCH: 31.7 pg (ref 27.0–33.0)
MCHC: 32.9 g/dL (ref 31.6–35.4)
MCV: 96.4 fL (ref 81.4–101.7)
MPV: 9.3 fL (ref 7.5–12.5)
Monocytes Relative: 8 %
Neutro Abs: 11693 {cells}/uL — ABNORMAL HIGH (ref 1500–7800)
Neutrophils Relative %: 81.2 %
Platelets: 181 Thousand/uL (ref 140–400)
RBC: 4.76 Million/uL (ref 4.20–5.80)
RDW: 12.8 % (ref 11.0–15.0)
Total Lymphocyte: 8.4 %
WBC: 14.4 Thousand/uL — ABNORMAL HIGH (ref 3.8–10.8)

## 2024-04-01 LAB — LIPID PANEL
Cholesterol: 122 mg/dL
HDL: 58 mg/dL
LDL Cholesterol (Calc): 37 mg/dL
Non-HDL Cholesterol (Calc): 64 mg/dL
Total CHOL/HDL Ratio: 2.1 (calc)
Triglycerides: 204 mg/dL — ABNORMAL HIGH

## 2024-04-01 LAB — HEMOGLOBIN A1C
Hgb A1c MFr Bld: 5.8 % — ABNORMAL HIGH
Mean Plasma Glucose: 120 mg/dL
eAG (mmol/L): 6.6 mmol/L

## 2024-04-01 LAB — T4, FREE: Free T4: 1.2 ng/dL (ref 0.8–1.8)

## 2024-04-01 LAB — PSA: PSA: 0.34 ng/mL

## 2024-04-01 LAB — TSH: TSH: 2.19 m[IU]/L (ref 0.40–4.50)

## 2024-04-17 ENCOUNTER — Ambulatory Visit: Payer: Self-pay | Admitting: Family Medicine

## 2024-04-22 ENCOUNTER — Other Ambulatory Visit: Payer: Self-pay | Admitting: Family Medicine

## 2024-04-22 DIAGNOSIS — M150 Primary generalized (osteo)arthritis: Secondary | ICD-10-CM

## 2024-04-22 DIAGNOSIS — M5030 Other cervical disc degeneration, unspecified cervical region: Secondary | ICD-10-CM

## 2024-04-23 NOTE — Telephone Encounter (Signed)
 Requested medications are due for refill today.  yes  Requested medications are on the active medications list.  yes  Last refill. 01/21/2024 #120 2 rf  Future visit scheduled.   yes  Notes to clinic.  Refill not delegated.    Requested Prescriptions  Pending Prescriptions Disp Refills   traMADol  (ULTRAM ) 50 MG tablet [Pharmacy Med Name: traMADol  HCL 50MG  TABLET] 120 tablet     Sig: TAKE 1 TABLET BY MOUTH EVERY 6 HOURS AS NEEDED FOR MODERATE PAIN     Not Delegated - Analgesics:  Opioid Agonists Failed - 04/23/2024 10:34 AM      Failed - This refill cannot be delegated      Failed - Urine Drug Screen completed in last 360 days      Passed - Valid encounter within last 3 months    Recent Outpatient Visits           3 weeks ago Annual physical exam   [place] [facility] [place] PARAS, [place]   7 months ago Elevated hemoglobin A1c   [place] [facility] [place], Marsa PARAS, DO   9 months ago Medicare annual wellness visit, initial   [place] [facility] [place], [place]   11 months ago Acute non-recurrent frontal sinusitis   [place] [facility] [place], [place]   11 months ago Acute non-recurrent frontal sinusitis   [place] [facility] [place], [place]AS, [place]

## 2024-10-08 ENCOUNTER — Ambulatory Visit: Admitting: Family Medicine
